# Patient Record
Sex: Female | Born: 1965 | Race: White | Hispanic: No | Marital: Married | Smoking: Former smoker
Health system: Southern US, Community
[De-identification: ages and names within clinical notes are randomized; demographics above are authoritative.]

## PROBLEM LIST (undated history)

## (undated) DIAGNOSIS — R569 Unspecified convulsions: Secondary | ICD-10-CM

## (undated) DIAGNOSIS — F329 Major depressive disorder, single episode, unspecified: Secondary | ICD-10-CM

## (undated) DIAGNOSIS — K219 Gastro-esophageal reflux disease without esophagitis: Secondary | ICD-10-CM

## (undated) DIAGNOSIS — M797 Fibromyalgia: Secondary | ICD-10-CM

## (undated) DIAGNOSIS — F419 Anxiety disorder, unspecified: Secondary | ICD-10-CM

## (undated) DIAGNOSIS — E079 Disorder of thyroid, unspecified: Secondary | ICD-10-CM

## (undated) DIAGNOSIS — M1990 Unspecified osteoarthritis, unspecified site: Secondary | ICD-10-CM

## (undated) DIAGNOSIS — D649 Anemia, unspecified: Secondary | ICD-10-CM

## (undated) DIAGNOSIS — E785 Hyperlipidemia, unspecified: Secondary | ICD-10-CM

## (undated) DIAGNOSIS — K589 Irritable bowel syndrome without diarrhea: Secondary | ICD-10-CM

## (undated) DIAGNOSIS — R5382 Chronic fatigue, unspecified: Secondary | ICD-10-CM

## (undated) DIAGNOSIS — T7840XA Allergy, unspecified, initial encounter: Secondary | ICD-10-CM

## (undated) DIAGNOSIS — G43909 Migraine, unspecified, not intractable, without status migrainosus: Secondary | ICD-10-CM

## (undated) DIAGNOSIS — F32A Depression, unspecified: Secondary | ICD-10-CM

## (undated) HISTORY — DX: Major depressive disorder, single episode, unspecified: F32.9

## (undated) HISTORY — DX: Disorder of thyroid, unspecified: E07.9

## (undated) HISTORY — DX: Allergy, unspecified, initial encounter: T78.40XA

## (undated) HISTORY — DX: Irritable bowel syndrome, unspecified: K58.9

## (undated) HISTORY — DX: Anemia, unspecified: D64.9

## (undated) HISTORY — DX: Fibromyalgia: M79.7

## (undated) HISTORY — DX: Depression, unspecified: F32.A

## (undated) HISTORY — DX: Migraine, unspecified, not intractable, without status migrainosus: G43.909

## (undated) HISTORY — DX: Anxiety disorder, unspecified: F41.9

## (undated) HISTORY — PX: FOOT SURGERY: SHX648

## (undated) HISTORY — DX: Hyperlipidemia, unspecified: E78.5

## (undated) HISTORY — DX: Unspecified convulsions: R56.9

## (undated) HISTORY — DX: Gastro-esophageal reflux disease without esophagitis: K21.9

## (undated) HISTORY — PX: COSMETIC SURGERY: SHX468

## (undated) HISTORY — DX: Chronic fatigue, unspecified: R53.82

## (undated) HISTORY — DX: Unspecified osteoarthritis, unspecified site: M19.90

---

## 2004-08-21 ENCOUNTER — Ambulatory Visit: Payer: Self-pay | Admitting: Family Medicine

## 2004-08-31 ENCOUNTER — Ambulatory Visit: Payer: Self-pay | Admitting: *Deleted

## 2004-11-20 ENCOUNTER — Ambulatory Visit: Payer: Self-pay | Admitting: Family Medicine

## 2005-03-12 ENCOUNTER — Ambulatory Visit: Payer: Self-pay | Admitting: Family Medicine

## 2005-03-26 ENCOUNTER — Ambulatory Visit (HOSPITAL_COMMUNITY): Admission: RE | Admit: 2005-03-26 | Discharge: 2005-03-26 | Payer: Self-pay | Admitting: Family Medicine

## 2005-04-07 ENCOUNTER — Ambulatory Visit: Payer: Self-pay | Admitting: Family Medicine

## 2005-09-20 HISTORY — PX: BREAST SURGERY: SHX581

## 2006-06-06 DIAGNOSIS — Z86000 Personal history of in-situ neoplasm of breast: Secondary | ICD-10-CM | POA: Insufficient documentation

## 2008-03-29 ENCOUNTER — Encounter: Admission: RE | Admit: 2008-03-29 | Discharge: 2008-03-29 | Payer: Self-pay | Admitting: Internal Medicine

## 2008-10-10 ENCOUNTER — Ambulatory Visit (HOSPITAL_COMMUNITY): Admission: RE | Admit: 2008-10-10 | Discharge: 2008-10-10 | Payer: Self-pay | Admitting: Family Medicine

## 2009-04-04 ENCOUNTER — Ambulatory Visit (HOSPITAL_COMMUNITY): Admission: RE | Admit: 2009-04-04 | Discharge: 2009-04-04 | Payer: Self-pay | Admitting: Obstetrics and Gynecology

## 2009-04-04 ENCOUNTER — Encounter (INDEPENDENT_AMBULATORY_CARE_PROVIDER_SITE_OTHER): Payer: Self-pay | Admitting: Obstetrics and Gynecology

## 2009-11-04 ENCOUNTER — Encounter: Admission: RE | Admit: 2009-11-04 | Discharge: 2010-02-02 | Payer: Self-pay | Admitting: Occupational Medicine

## 2009-12-13 IMAGING — CT CT ANGIO NECK
1 of 8 series · 3 of 33 positions shown · IV contrast (APPLIED)
Comparison: None.

CLINICAL DATA: Left-sided neck pain and dizziness.  No known
trauma.  Seeing floaters at work this week.

CT ANGIOGRAPHY NECK
TECHNIQUE: Multidetector CT imaging of the neck was performed
using the standard protocol prior to and during bolus
administration of intravenous contrast. Multiplanar CT image
reconstructions including MIPs  were obtained to evaluate the
vascular anatomy.
Contrast: 100 ml Omnipaque 350.

[Series 6: neck without · axial · non-contrast · 0.34mm/px · z∈[-350,-116]mm · 3 of 79 slices shown]
[im 1/79  soft-tissue]
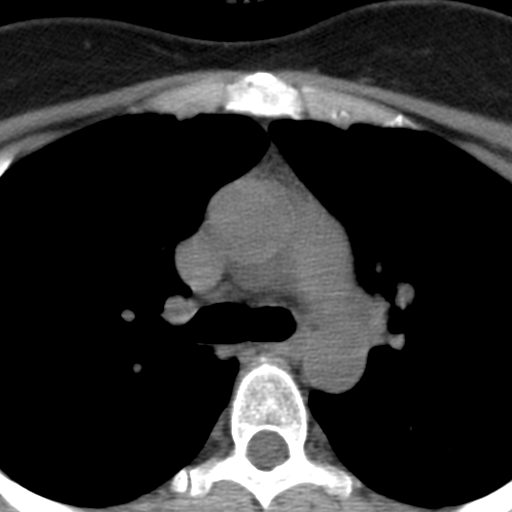
[im 40/79  bone]
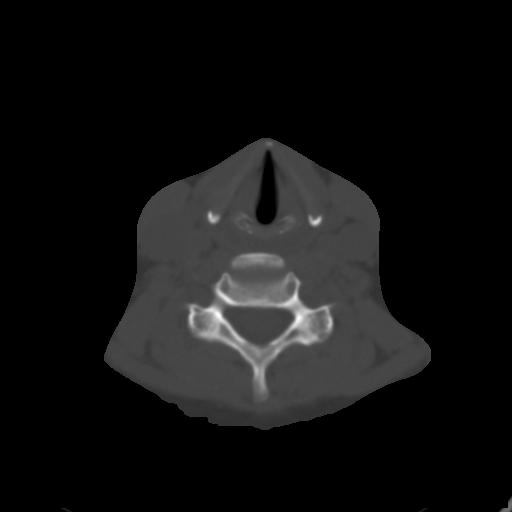
[im 79/79  soft-tissue]
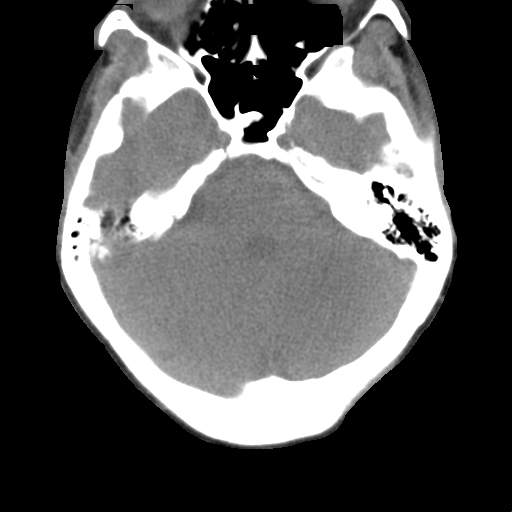

[3 of 33 positions shown; findings below may reference images not displayed]

FINDINGS: No evidence of vertebral artery or carotid neck
dissection or significant stenosis Mild degenerative changes
cervical spine.  Limited imaging of intracranial structures
unremarkable.  No neck mass identified.  Scattered normal sized
lymph nodes which appears slightly elongated.  The lung apices are
clear.  Polypoid opacification right maxillary sinus.
IMPRESSION: No evidence of vertebral or carotid artery neck dissection or high-
grade stenosis. Intracranial vessels were not evaluated on the
present exam.

Mild cervical spine degenerative changes.

Polypoid opacification right maxillary sinus.

## 2010-10-11 ENCOUNTER — Encounter: Payer: Self-pay | Admitting: Internal Medicine

## 2010-10-11 ENCOUNTER — Encounter: Payer: Self-pay | Admitting: Family Medicine

## 2010-11-02 ENCOUNTER — Ambulatory Visit: Payer: 59 | Attending: Specialist | Admitting: Physical Therapy

## 2010-11-02 DIAGNOSIS — R293 Abnormal posture: Secondary | ICD-10-CM | POA: Insufficient documentation

## 2010-11-02 DIAGNOSIS — R5381 Other malaise: Secondary | ICD-10-CM | POA: Insufficient documentation

## 2010-11-02 DIAGNOSIS — M542 Cervicalgia: Secondary | ICD-10-CM | POA: Insufficient documentation

## 2010-11-02 DIAGNOSIS — IMO0001 Reserved for inherently not codable concepts without codable children: Secondary | ICD-10-CM | POA: Insufficient documentation

## 2010-11-03 ENCOUNTER — Ambulatory Visit: Payer: 59 | Admitting: Physical Therapy

## 2010-11-10 ENCOUNTER — Ambulatory Visit: Payer: 59 | Admitting: Physical Therapy

## 2010-11-11 ENCOUNTER — Encounter: Payer: 59 | Admitting: Physical Therapy

## 2010-11-12 ENCOUNTER — Ambulatory Visit: Payer: 59 | Admitting: Physical Therapy

## 2010-11-16 ENCOUNTER — Ambulatory Visit: Payer: 59 | Admitting: Physical Therapy

## 2010-11-27 ENCOUNTER — Encounter: Payer: 59 | Admitting: Oncology

## 2010-11-27 ENCOUNTER — Encounter (HOSPITAL_BASED_OUTPATIENT_CLINIC_OR_DEPARTMENT_OTHER): Payer: 59 | Admitting: Oncology

## 2010-11-27 DIAGNOSIS — C50919 Malignant neoplasm of unspecified site of unspecified female breast: Secondary | ICD-10-CM

## 2010-12-27 LAB — CBC
MCHC: 35.3 g/dL (ref 30.0–36.0)
MCV: 86.7 fL (ref 78.0–100.0)
Platelets: 158 10*3/uL (ref 150–400)
RBC: 4.32 MIL/uL (ref 3.87–5.11)
WBC: 3.7 10*3/uL — ABNORMAL LOW (ref 4.0–10.5)

## 2010-12-27 LAB — COMPREHENSIVE METABOLIC PANEL
CO2: 30 mEq/L (ref 19–32)
Calcium: 9.3 mg/dL (ref 8.4–10.5)
Creatinine, Ser: 0.76 mg/dL (ref 0.4–1.2)
GFR calc Af Amer: >60 mL/min (ref >60)
GFR calc non Af Amer: >60 mL/min (ref >60)
Glucose, Bld: 90 mg/dL (ref 70–99)
Total Protein: 7 g/dL (ref 6.0–8.3)

## 2011-02-02 NOTE — Op Note (Signed)
NAMEVEE, BAHE                  ACCOUNT NO.:  [id number]   MEDICAL RECORD NO.:  [id number]          PATIENT TYPE:  AMB   LOCATION:  SDC                           FACILITY:  [facility]   PHYSICIAN:  Duke Salvia. Marcelle Overlie, M.D.DATE OF BIRTH:  July 27, 1966   DATE OF PROCEDURE:  04/04/2009  DATE OF DISCHARGE:                               OPERATIVE REPORT   PREOPERATIVE DIAGNOSES:  Endometrial polyp, abnormal uterine bleeding.   POSTOPERATIVE DIAGNOSES:  Endometrial polyp, abnormal uterine bleeding.   PROCEDURES:  Dilatation and curettage, hysteroscopy.   SURGEON:  Duke Salvia. Marcelle Overlie, MD   ANESTHESIA:  General plus paracervical block.   SPECIMENS REMOVED:  Endometrial curettings.   PROCEDURE AND FINDINGS:  The patient was taken to the operating room.  After an adequate level of general anesthesia was obtained with the  patient's legs in stirrups, the perineum and vagina were prepped and  draped in usual manner for D and C.  The bladder was drained, EUA  carried out.  Uterus, mid position, normal size.  Adnexa negative.  A  speculum was positioned.  Cervix was grasped with tenaculum.  Paracervical block was created by infiltrating at 3 and 9 o'clock  submucosally, 5-7 mL of 1% Xylocaine on either side after negative  aspiration.  Uterus was found to 7 cm, progressively dilated to 27 Pratt  dilator.  The 4-mm continuous flow hysteroscope was then inserted.  Both  tubal ostia could be easily seen.  There was a posterior wall polyp that  was noted by sonohysterogram previously.  Scope was removed.  Sharp  curettage was carried out.  Exploration with a polyp forceps revealed no  further tissue.  The scope was reinserted.  The cavity irrigated.  The  polyp was noted to be removed and the remaining walls of the uterus were  unremarkable.  Tissue was sent to Pathology.  She tolerated this well,  went to recovery room in good condition.      Richard M. Marcelle Overlie, M.D.  Electronically  Signed     RM[facility]/MEDQ  D:  04/04/2009  T:  04/04/2009  Job:  [id number]

## 2011-02-02 NOTE — H&P (Signed)
NAMEBLAIZE, NIPPER                  ACCOUNT NO.:  [id number]   MEDICAL RECORD NO.:  [id number]          PATIENT TYPE:  AMB   LOCATION:  SDC                           FACILITY:  [facility]   PHYSICIAN:  Duke Salvia. Marcelle Overlie, M.D.DATE OF BIRTH:  Sep 18, 1966   DATE OF ADMISSION:  DATE OF DISCHARGE:                              HISTORY & PHYSICAL   CHIEF COMPLAINT:  Abnormal bleeding.   HISTORY OF PRESENT ILLNESS:  A 45 year old G1 P0 not currently using  anything for contraception was referred to me from Dr. Samuel Jester  complaining of heavy irregular bleeding.  We performed SHG in addition  to checking her FSH.  FSH returned 25.5, sonohysterogram showed small  cyst on the right ovary, no free fluid, polypoid mass noted within the  cavity that was well defined.  She presents now for D and C,  hysteroscopy.  This procedure including risks related to bleeding,  infection, other complications that may require additional surgery all  reviewed with her, which she understands and accepts.   ALLERGIES:  LATEX, CODEINE, SULFA, DOXYCYCLINE.   CURRENT MEDICATIONS:  Klonopin, imipramine, Prilosec, Claritin, Nasonex,  multivitamins.   SURGICAL HISTORY:  She had a perforated eardrum in the past along with  rhinoplasty, right breast lumpectomy, and prior D and C in 1995.   REVIEW OF SYSTEMS:  Significant for history of headache, anemia, UTI,  carcinoma in situ of the breast.   FAMILY HISTORY:  Significant for headache, asthma, hiatal hernia,  thyroid disease, TB, diabetes, hypertension.   SOCIAL HISTORY:  Denies drug or alcohol use.  She does smoke 1-1/2 PPD.  Her medical doctor is Dr. Samuel Jester.  She is married.   PHYSICAL EXAMINATION:  VITAL SIGNS:  Temperature 98.2, blood pressure  120/78.  HEENT: Unremarkable.  NECK:  Supple without masses.  LUNGS:  Clear.  CARDIOVASCULAR:  Regular rate and rhythm without murmurs, rubs, or  gallops.  BREASTS:  Without masses.  ABDOMEN:  Soft, flat,  nontender.  PELVIC:  Normal external genitalia.  Vaginal swab is clear.  Uterus mid  positional size.  Adnexa negative.   IMPRESSION:  1. Abnormal uterine bleeding.  2. Endometrial polyp.   PLAN:  D and C, hysteroscopy.  Procedure and risks reviewed as above.      Richard M. Marcelle Overlie, M.D.  Electronically Signed     RM[facility]/MEDQ  D:  04/01/2009  T:  04/02/2009  Job:  [id number]

## 2011-03-30 ENCOUNTER — Ambulatory Visit: Payer: 59 | Attending: *Deleted | Admitting: Physical Therapy

## 2011-03-30 DIAGNOSIS — M542 Cervicalgia: Secondary | ICD-10-CM | POA: Insufficient documentation

## 2011-03-30 DIAGNOSIS — M2560 Stiffness of unspecified joint, not elsewhere classified: Secondary | ICD-10-CM | POA: Insufficient documentation

## 2011-03-30 DIAGNOSIS — R5381 Other malaise: Secondary | ICD-10-CM | POA: Insufficient documentation

## 2011-04-01 ENCOUNTER — Ambulatory Visit: Payer: 59 | Admitting: Physical Therapy

## 2011-04-08 ENCOUNTER — Ambulatory Visit: Payer: 59 | Admitting: Physical Therapy

## 2011-04-09 ENCOUNTER — Encounter: Payer: 59 | Admitting: Physical Therapy

## 2011-04-14 ENCOUNTER — Ambulatory Visit: Payer: 59 | Admitting: Physical Therapy

## 2011-04-19 ENCOUNTER — Encounter: Payer: 59 | Admitting: Physical Therapy

## 2011-04-20 ENCOUNTER — Ambulatory Visit: Payer: 59 | Attending: *Deleted | Admitting: *Deleted

## 2011-04-20 DIAGNOSIS — M2560 Stiffness of unspecified joint, not elsewhere classified: Secondary | ICD-10-CM | POA: Insufficient documentation

## 2011-04-20 DIAGNOSIS — R5381 Other malaise: Secondary | ICD-10-CM | POA: Insufficient documentation

## 2011-04-20 DIAGNOSIS — M542 Cervicalgia: Secondary | ICD-10-CM | POA: Insufficient documentation

## 2011-04-22 ENCOUNTER — Ambulatory Visit: Payer: 59 | Attending: *Deleted | Admitting: Physical Therapy

## 2011-04-22 DIAGNOSIS — R5381 Other malaise: Secondary | ICD-10-CM | POA: Insufficient documentation

## 2011-04-22 DIAGNOSIS — M542 Cervicalgia: Secondary | ICD-10-CM | POA: Insufficient documentation

## 2011-04-22 DIAGNOSIS — M2560 Stiffness of unspecified joint, not elsewhere classified: Secondary | ICD-10-CM | POA: Insufficient documentation

## 2011-04-28 ENCOUNTER — Ambulatory Visit: Payer: 59 | Admitting: Physical Therapy

## 2011-05-03 ENCOUNTER — Ambulatory Visit: Payer: 59 | Admitting: Physical Therapy

## 2011-07-06 ENCOUNTER — Other Ambulatory Visit: Payer: Self-pay | Admitting: Obstetrics and Gynecology

## 2013-03-15 ENCOUNTER — Ambulatory Visit: Payer: BC Managed Care – PPO | Attending: Orthopedic Surgery | Admitting: Physical Therapy

## 2013-03-15 DIAGNOSIS — IMO0001 Reserved for inherently not codable concepts without codable children: Secondary | ICD-10-CM | POA: Insufficient documentation

## 2013-03-15 DIAGNOSIS — M24519 Contracture, unspecified shoulder: Secondary | ICD-10-CM | POA: Insufficient documentation

## 2013-03-15 DIAGNOSIS — M25519 Pain in unspecified shoulder: Secondary | ICD-10-CM | POA: Insufficient documentation

## 2013-03-20 ENCOUNTER — Ambulatory Visit: Payer: BC Managed Care – PPO | Attending: Orthopedic Surgery | Admitting: Physical Therapy

## 2013-03-20 DIAGNOSIS — M24519 Contracture, unspecified shoulder: Secondary | ICD-10-CM | POA: Insufficient documentation

## 2013-03-20 DIAGNOSIS — IMO0001 Reserved for inherently not codable concepts without codable children: Secondary | ICD-10-CM | POA: Insufficient documentation

## 2013-03-20 DIAGNOSIS — M25519 Pain in unspecified shoulder: Secondary | ICD-10-CM | POA: Insufficient documentation

## 2013-03-22 ENCOUNTER — Ambulatory Visit: Payer: BC Managed Care – PPO | Admitting: Physical Therapy

## 2013-03-26 ENCOUNTER — Ambulatory Visit: Payer: BC Managed Care – PPO | Admitting: Physical Therapy

## 2013-03-28 ENCOUNTER — Ambulatory Visit: Payer: BC Managed Care – PPO

## 2013-04-02 ENCOUNTER — Ambulatory Visit: Payer: BC Managed Care – PPO | Admitting: Physical Therapy

## 2013-04-05 ENCOUNTER — Ambulatory Visit: Payer: BC Managed Care – PPO | Admitting: Physical Therapy

## 2013-04-09 ENCOUNTER — Ambulatory Visit: Payer: BC Managed Care – PPO | Admitting: *Deleted

## 2013-04-12 ENCOUNTER — Ambulatory Visit: Payer: BC Managed Care – PPO | Admitting: Physical Therapy

## 2013-04-16 ENCOUNTER — Ambulatory Visit: Payer: BC Managed Care – PPO | Admitting: *Deleted

## 2013-04-18 ENCOUNTER — Encounter: Payer: 59 | Admitting: *Deleted

## 2013-04-23 ENCOUNTER — Ambulatory Visit: Payer: BC Managed Care – PPO | Attending: Orthopedic Surgery | Admitting: Physical Therapy

## 2013-04-23 DIAGNOSIS — IMO0001 Reserved for inherently not codable concepts without codable children: Secondary | ICD-10-CM | POA: Insufficient documentation

## 2013-04-23 DIAGNOSIS — M25519 Pain in unspecified shoulder: Secondary | ICD-10-CM | POA: Insufficient documentation

## 2013-04-23 DIAGNOSIS — M24519 Contracture, unspecified shoulder: Secondary | ICD-10-CM | POA: Insufficient documentation

## 2013-05-03 ENCOUNTER — Ambulatory Visit: Payer: 59 | Admitting: Sports Medicine

## 2013-05-07 ENCOUNTER — Ambulatory Visit: Payer: BC Managed Care – PPO | Admitting: Physical Therapy

## 2013-05-24 ENCOUNTER — Ambulatory Visit (INDEPENDENT_AMBULATORY_CARE_PROVIDER_SITE_OTHER): Payer: BC Managed Care – PPO | Admitting: Sports Medicine

## 2013-05-24 VITALS — BP 100/64 | Ht 64.0 in | Wt 130.0 lb

## 2013-05-24 DIAGNOSIS — G5760 Lesion of plantar nerve, unspecified lower limb: Secondary | ICD-10-CM | POA: Insufficient documentation

## 2013-05-24 DIAGNOSIS — M797 Fibromyalgia: Secondary | ICD-10-CM | POA: Insufficient documentation

## 2013-05-24 DIAGNOSIS — M25569 Pain in unspecified knee: Secondary | ICD-10-CM

## 2013-05-24 DIAGNOSIS — M25561 Pain in right knee: Secondary | ICD-10-CM

## 2013-05-24 DIAGNOSIS — G43901 Migraine, unspecified, not intractable, with status migrainosus: Secondary | ICD-10-CM

## 2013-05-24 DIAGNOSIS — G5761 Lesion of plantar nerve, right lower limb: Secondary | ICD-10-CM

## 2013-05-24 DIAGNOSIS — IMO0001 Reserved for inherently not codable concepts without codable children: Secondary | ICD-10-CM

## 2013-05-24 MED ORDER — AMITRIPTYLINE HCL 25 MG PO TABS: 25.0000 mg | ORAL_TABLET | Freq: Every day | ORAL | Status: DC

## 2013-05-24 NOTE — Progress Notes (Signed)
  Subjective:    Patient ID: Ruth Hopkins, female    DOB: [birth year], 47 y.o.   MRN: [id number]  HPI  47 year old F with a PMH of fibroyalgia, migraine headaches, and sciatica who presents today for evaluation of bilateral foot pain. The pain started in April of 2014 when she started vigorous walking on hills outside after previously exercising inside during the winter. The pain started on the "balls of her feet" and felt like burning or stabbing.She also notes intermittent numbness and is concerned that is related to sciatica. Currently, the pain is worse on the right than the left and radiates to the back of both side of her ankles and right lateral lower leg and right knee.   For this problem, she reports that she has seen several physicians including a podiatrist, Dr. Charlsie Merles, at [facility] who made orthotic insoles for the patient, which she claims helped minimally. She has also seen Dr. Ulice Brilliant, podiatrist, in [place] for the issue who diagnosed her with with a neuroma and performed four steroid injections of her feet. This did not help. Additionally, she has seen Dr. Victorino Dike, orthopedic surgeon, who noted that she had hammer toes and prescribed metatarsal pads. The patient stopped using this, because, these increased her pain.   The patient does not bring any records to clinic today.   Review of Systems Frequent night time awakenings     Objective:   Physical Exam BP 100/64  Ht 5\' 4"  (1.626 m)  Wt 130 lb (58.968 kg)  BMI 22.3 kg/m2  Gen: middle age WF, pleasant, non distressed, smells strongly of cigarette smoke Feet:    - Morton's foot bilat, prominent 2nd,3rd toes, mild hammer toe of 5th toes bilaterally, hallux limitus on right   - maitained mid foot arch with collapse of anterior arch on right more than left  - Left sole with callous  - Standing with neutral position  Right Ankle: normal appearing, no edema, tenderness of posterior lateral and medial malleoli, full flexion,  extension, inversion and eversion of foot  Right Knee Exam: normal appearing, full extension and flexion of knee, negative Mcmurray's and Lachman's tests, normal strength of tensor fascia lata, decreased strength of vastus medialis and gluteus maximus  Back: negative straight leg raise test, 5/5 strength of knee extension and flexion, dorsi and plantar flexion        Assessment & Plan:  47 year old F with pain in her foot due to transverse arch collapse, knee pain due to weakness of Gluteus maximus and vastus medialis, and complicated by poorly controlled fibromyalgia pain.

## 2013-05-24 NOTE — Assessment & Plan Note (Addendum)
Assessment: collapse of transverse arch contributing to neuralgia pain Plan: fitted for extra-small metatarsal pads, patient tolerated them well in clinic, will slowly increase walking as tolerated  I think Dr Victorino Dike started correct TX but pad may have been too large for her to tolerate as she is sensitive about changes  foot shows thin fat pad Morton type with dominant 2nd and 3rd MT

## 2013-05-24 NOTE — Assessment & Plan Note (Addendum)
Assessment: Patient needs improved control Plan: stop imipramine and replace with amitriptyline 25 mg QHS for improved sleep and pain control

## 2013-05-24 NOTE — Assessment & Plan Note (Signed)
Assessment: weakness of multiple muscles including gluteus maximus and vastus medialis Plan: given exercise regimen for muscle strengthening

## 2013-05-24 NOTE — Patient Instructions (Addendum)
Dr. Wallace Cullens,   It was nice to meet you today. Please read below about the following issues:   1. Foot Pain - This is due to collapse of your front arch. That is why we are using the metatarsal pads to take relief off of nerves. Continue to use these and gradually increase the amount that you walk. Please do the following exercises:  A. Toe Curls - grab a towel with your toes and pull it towards you B. One Foot Balance - Stand on one foot for 5-10 seconds and then alternate to the other side, repeat 5 times  2. Right Knee Pain - This could be due to weakness of one of you quad muscles. Please do the following exercises:  A. Butt massage with tennis ball on the right side B. Hip Abduction (see handout) - 10 times, 3 sets C. Standing Rotation (see handout)  - 10 times, 3 sets   3. Fibromyalgia Pain - I think you should start taking the Amitriptyline 25 mg each night.   Follow up in 4-6 weeks,   Dr. Clinton Sawyer

## 2013-06-28 ENCOUNTER — Encounter: Payer: Self-pay | Admitting: Sports Medicine

## 2013-06-28 ENCOUNTER — Ambulatory Visit (INDEPENDENT_AMBULATORY_CARE_PROVIDER_SITE_OTHER): Payer: BC Managed Care – PPO | Admitting: Sports Medicine

## 2013-06-28 VITALS — BP 100/60 | Ht 64.0 in | Wt 127.0 lb

## 2013-06-28 DIAGNOSIS — G5761 Lesion of plantar nerve, right lower limb: Secondary | ICD-10-CM

## 2013-06-28 DIAGNOSIS — G5760 Lesion of plantar nerve, unspecified lower limb: Secondary | ICD-10-CM

## 2013-06-28 DIAGNOSIS — IMO0001 Reserved for inherently not codable concepts without codable children: Secondary | ICD-10-CM

## 2013-06-28 DIAGNOSIS — M797 Fibromyalgia: Secondary | ICD-10-CM

## 2013-06-28 NOTE — Assessment & Plan Note (Signed)
Patient has a very narrow foot and a very limited fat pad of the foot  She has very well may orthotics  We added an Ex. small metatarsal pad  We added some foam cushioning under the metatarsal arch  I wanted her to try this for at least a couple weeks to see if it gives her some relief

## 2013-06-28 NOTE — Assessment & Plan Note (Signed)
I think many of her muscle and joint aches symptoms relate to a fibromyalgia type pain complex. I would encourage her to work with one physician to manage the medications. We did not prescribe any today.  I do think she would benefit from some low level but fairly consistent exercise activities as this does seem to lessen pain in fibromyalgia patients.  She was given a suggestion of a number of these today.  She should continue walking but not push it to the point of significant pain  When necessary recheck

## 2013-06-28 NOTE — Progress Notes (Signed)
  Subjective:    Patient ID: Ruth Hopkins, female    DOB: [birth year], 47 y.o.   MRN: [id number]  HPI  Pt presents to clinic for f/u of bilateral foot pain that is not improved significantly. Still having pain on balls of feet R > L.  Did not start amitriptyline as she was concerned about weight gain.   Doing suggested hip exercises as she is able. This has helped strengthen her hips. In spite of her fibromyalgia pain she continues to push exercise fairly often because she worries about gaining weight.  She currently sees a number of other physicians for her migraine problems and fibromyalgia problems.   Review of Systems     Objective:   Physical Exam  NAD  Morton's foot bilat  Straight leg raise neg bilat Leg lengths equal  Alignment good FABER right L > R Moderate weakness rt hip abductor Lt hip abductor strong  Quads and hip flexors strong bilat  TTP over rt piriformis, not left Perispinous muscle at level of scapula ttp  No spasm in either trap, Q lumborum, latissimus dorsi   Prominent coccyx          Assessment & Plan:

## 2013-06-28 NOTE — Patient Instructions (Addendum)
Continue suggested hip exercises- side leg lifts and hip rotations  Do 5-10 repititions with light weight dumbbell on exercises for your arms and shoulders Curls Flies Shoulder presses   Shake outs to help loosen neck muscles   Follow up in 2 months if needed  Thank you for seeing [place] today!

## 2013-07-02 ENCOUNTER — Ambulatory Visit: Payer: BC Managed Care – PPO | Attending: Orthopedic Surgery | Admitting: Physical Therapy

## 2013-07-02 DIAGNOSIS — IMO0001 Reserved for inherently not codable concepts without codable children: Secondary | ICD-10-CM | POA: Insufficient documentation

## 2013-07-02 DIAGNOSIS — R5381 Other malaise: Secondary | ICD-10-CM | POA: Insufficient documentation

## 2013-07-02 DIAGNOSIS — M25579 Pain in unspecified ankle and joints of unspecified foot: Secondary | ICD-10-CM | POA: Insufficient documentation

## 2013-07-04 ENCOUNTER — Ambulatory Visit: Payer: BC Managed Care – PPO | Admitting: Physical Therapy

## 2013-07-09 DIAGNOSIS — F17200 Nicotine dependence, unspecified, uncomplicated: Secondary | ICD-10-CM | POA: Insufficient documentation

## 2013-07-11 ENCOUNTER — Ambulatory Visit: Payer: BC Managed Care – PPO

## 2013-07-17 ENCOUNTER — Ambulatory Visit: Payer: BC Managed Care – PPO

## 2013-07-18 ENCOUNTER — Ambulatory Visit (INDEPENDENT_AMBULATORY_CARE_PROVIDER_SITE_OTHER): Payer: BC Managed Care – PPO

## 2013-07-18 ENCOUNTER — Ambulatory Visit (INDEPENDENT_AMBULATORY_CARE_PROVIDER_SITE_OTHER): Payer: BC Managed Care – PPO | Admitting: Podiatry

## 2013-07-18 DIAGNOSIS — Z9889 Other specified postprocedural states: Secondary | ICD-10-CM

## 2013-07-18 DIAGNOSIS — M779 Enthesopathy, unspecified: Secondary | ICD-10-CM

## 2013-07-18 DIAGNOSIS — M7750 Other enthesopathy of unspecified foot: Secondary | ICD-10-CM

## 2013-07-18 NOTE — Progress Notes (Signed)
Pt present for follow-up of R plantar forefoot pain.

## 2013-07-19 NOTE — Progress Notes (Signed)
Subjective:     Patient ID: Ruth Hopkins, female   DOB: 05/22/1966, 47 y.o.   MRN: [id number]  Foot Pain   patient states I am still having pain in my right foot but it is improved since we started and I like my orthotics   Review of Systems  All other systems reviewed and are negative.       Objective:   Physical Exam  Nursing note and vitals reviewed. Constitutional: She is oriented to person, place, and time.  Cardiovascular: Intact distal pulses.   Musculoskeletal: Normal range of motion.  Neurological: She is oriented to person, place, and time.   patient is found to have continued nondescript discomfort in the right forefoot with no area of acute. It appears to be the dorsum of the metatarsal shafts within the plantar fascia and moderate discomfort of the second and third metatarsophalangeal joint right over left    Assessment:     Nondescript pain which may be fibromyalgia or other systemic condition versus localized. I biggest concern is the second and third metatarsophalangeal joints    Plan:     X-rays reviewed trying to see if change has occurred. I discussed that the only thing that may be of some benefit to her beyond her orthotics would be shortening osteotomies of the second and third metatarsals right foot. I want her to spend the next 6 weeks trying to better understand where her pain seems to originate from and we can make a decision if there's anything else we can do to try to help her. I did explain that there is no guarantees that we will be able to improve over where she stands at this time

## 2013-07-21 ENCOUNTER — Other Ambulatory Visit: Payer: Self-pay | Admitting: *Deleted

## 2013-07-21 DIAGNOSIS — M779 Enthesopathy, unspecified: Secondary | ICD-10-CM

## 2013-07-24 ENCOUNTER — Ambulatory Visit: Payer: BC Managed Care – PPO | Attending: Orthopedic Surgery

## 2013-07-24 DIAGNOSIS — M25579 Pain in unspecified ankle and joints of unspecified foot: Secondary | ICD-10-CM | POA: Insufficient documentation

## 2013-07-24 DIAGNOSIS — R5381 Other malaise: Secondary | ICD-10-CM | POA: Insufficient documentation

## 2013-07-24 DIAGNOSIS — IMO0001 Reserved for inherently not codable concepts without codable children: Secondary | ICD-10-CM | POA: Insufficient documentation

## 2013-08-28 ENCOUNTER — Ambulatory Visit: Payer: BC Managed Care – PPO | Admitting: Sports Medicine

## 2013-08-29 ENCOUNTER — Ambulatory Visit (INDEPENDENT_AMBULATORY_CARE_PROVIDER_SITE_OTHER): Payer: BC Managed Care – PPO | Admitting: Podiatry

## 2013-08-29 ENCOUNTER — Encounter: Payer: Self-pay | Admitting: Podiatry

## 2013-08-29 VITALS — BP 108/76 | HR 79 | Resp 12

## 2013-08-29 DIAGNOSIS — M216X9 Other acquired deformities of unspecified foot: Secondary | ICD-10-CM

## 2013-08-29 DIAGNOSIS — M2040 Other hammer toe(s) (acquired), unspecified foot: Secondary | ICD-10-CM

## 2013-08-29 NOTE — Progress Notes (Signed)
Subjective:     Patient ID: Ruth Hopkins, female   DOB: Nov 15, 1965, 47 y.o.   MRN: [id number]  HPI patient presents stating these joints are still very sore and the length of my toe seems to cause the problems to when I try to wear shoe gear. The orthotics give minimal relief but when she tries to be active the pain reoccurs right over left   Review of Systems     Objective:   Physical Exam Neurovascular status intact with no health history changes noted. 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 We discussed shortening osteotomies along with digital stabilization and the possibility for significant improvement or not improving with this procedure. Patient would prefer to have the procedures done understanding risks and at this time I allowed her to sign consent form after extensive review of complications including not improving and the fact that total recovery. We'll take 6 months to one year. Patient wants surgery signs consent form and is given air fracture walker for preoperative usage and after surgery scheduled in the next 2 weeks and is encouraged to call with any questions

## 2013-08-29 NOTE — Patient Instructions (Signed)
Pre-Operative Instructions  Congratulations, you have decided to take an important step to improving your quality of life.  You can be assured that the doctors of [facility] will be with you every step of the way.  1. Plan to be at the surgery center/hospital at least 1 (one) hour prior to your scheduled time unless otherwise directed by the surgical center/hospital staff.  You must have a responsible adult accompany you, remain during the surgery and drive you home.  Make sure you have directions to the surgical center/hospital and know how to get there on time. 2. For hospital based surgery you will need to obtain a history and physical form from your family physician within 1 month prior to the date of surgery- we will give you a form for you primary physician.  3. We make every effort to accommodate the date you request for surgery.  There are however, times where surgery dates or times have to be moved.  We will contact you as soon as possible if a change in schedule is required.   4. No Aspirin/Ibuprofen for one week before surgery.  If you are on aspirin, any non-steroidal anti-inflammatory medications (Mobic, Aleve, Ibuprofen) you should stop taking it 7 days prior to your surgery.  You make take Tylenol  For pain prior to surgery.  5. Medications- If you are taking daily heart and blood pressure medications, seizure, reflux, allergy, asthma, anxiety, pain or diabetes medications, make sure the surgery center/hospital is aware before the day of surgery so they may notify you which medications to take or avoid the day of surgery. 6. No food or drink after midnight the night before surgery unless directed otherwise by surgical center/hospital staff. 7. No alcoholic beverages 24 hours prior to surgery.  No smoking 24 hours prior to or 24 hours after surgery. 8. Wear loose pants or shorts- loose enough to fit over bandages, boots, and casts. 9. No slip on shoes, sneakers are best. 10. Bring  your boot with you to the surgery center/hospital.  Also bring crutches or a walker if your physician has prescribed it for you.  If you do not have this equipment, it will be provided for you after surgery. 11. If you have not been contracted by the surgery center/hospital by the day before your surgery, call to confirm the date and time of your surgery. 12. Leave-time from work may vary depending on the type of surgery you have.  Appropriate arrangements should be made prior to surgery with your employer. 13. Prescriptions will be provided immediately following surgery by your doctor.  Have these filled as soon as possible after surgery and take the medication as directed. 14. Remove nail polish on the operative foot. 15. Wash the night before surgery.  The night before surgery wash the foot and leg well with the antibacterial soap provided and water paying special attention to beneath the toenails and in between the toes.  Rinse thoroughly with water and dry well with a towel.  Perform this wash unless told not to do so by your physician.  Enclosed: 1 Ice pack (please put in freezer the night before surgery)   1 Hibiclens skin cleaner   Pre-op Instructions  If you have any questions regarding the instructions, do not hesitate to call our office.  [place]: [address] [place], [place] [address] [phone]  [place]: [address] [place], [place] [address] [phone]  [place]: [address]  [place], [place] [address] [phone]  Dr. Richard   Tuchman DPM, Dr. Norman Regal DPM Dr. Richard Sikora DPM, Dr. M. Todd Hyatt DPM, Dr. Kathryn Egerton DPM 

## 2013-08-30 ENCOUNTER — Telehealth: Payer: Self-pay | Admitting: *Deleted

## 2013-08-30 NOTE — Telephone Encounter (Signed)
Pt asked how long before she could work.  I informed pt that Dr Charlsie Merles would not release to work until after her first surgery check 6 - 10 days post-op.  If she has a sit down job, she could return with his release and not on narcotic.  Pt states she' not working , but may get a job soon.  Pt asked when she could drive, I told her after 2 - 4 weeks, she may be in a lower surgical shoe and he may allow her short driving trips.  I told pt she would be able to fix quick meals and bathe without getting the dressings wet, after surgery.  Pt states understanding.

## 2013-09-05 ENCOUNTER — Telehealth: Payer: Self-pay | Admitting: *Deleted

## 2013-09-05 DIAGNOSIS — N6310 Unspecified lump in the right breast, unspecified quadrant: Secondary | ICD-10-CM | POA: Insufficient documentation

## 2013-09-05 NOTE — Telephone Encounter (Signed)
Pt cancelled her surgery for 09/05/2013, procedures:  Right 2, 3 Metatarsal Osteotomy, and Right 2, 3 Hammer toe repair with internal pins placement.

## 2013-09-05 NOTE — Telephone Encounter (Signed)
Cancelled patients first post op appointment

## 2013-09-12 ENCOUNTER — Encounter: Payer: BC Managed Care – PPO | Admitting: Podiatry

## 2013-09-19 ENCOUNTER — Ambulatory Visit: Payer: BC Managed Care – PPO | Admitting: Podiatry

## 2013-09-24 ENCOUNTER — Ambulatory Visit: Payer: BC Managed Care – PPO | Admitting: Podiatry

## 2013-10-22 ENCOUNTER — Telehealth: Payer: Self-pay | Admitting: *Deleted

## 2013-10-22 NOTE — Telephone Encounter (Signed)
Pt states the orthotics do not support the ball of her foot.  Please advise.

## 2013-10-23 ENCOUNTER — Telehealth: Payer: Self-pay | Admitting: *Deleted

## 2013-10-23 NOTE — Telephone Encounter (Signed)
lmom for pt to call back and schedule an appt 

## 2013-10-23 NOTE — Telephone Encounter (Signed)
Pt asked the procedures Dr Paulla Dolly would need to perform during the surgery 09/05/2013, she cancelled.  I informed her It would have been Right 2, 3 metatarsal osteotomy, and right 2, 3 hammer toe repair with internal pins.  Pt states she's filling out a form for life insurance.

## 2013-10-23 NOTE — Telephone Encounter (Signed)
Schedule pt appointment on Friday to have the issue checked. Make sure she brings her orthotics with her and the shoes she is wearing them in.

## 2013-10-24 NOTE — Telephone Encounter (Signed)
Patient scheduled for 10-26-13

## 2013-10-26 ENCOUNTER — Ambulatory Visit (INDEPENDENT_AMBULATORY_CARE_PROVIDER_SITE_OTHER): Payer: BC Managed Care – PPO | Admitting: *Deleted

## 2013-10-26 DIAGNOSIS — M779 Enthesopathy, unspecified: Secondary | ICD-10-CM

## 2013-10-29 NOTE — Patient Instructions (Signed)
Will call once new orthotics arrive.

## 2013-10-29 NOTE — Progress Notes (Signed)
Patient picked up 2nd pair of orthotics and they were completely different than her first pair. She states that they were really uncomfortable. After doing a little research, I realized that the lab made a 2nd pair for a different pat by the same name. I informed the patient I will send these back and have corrected at no charge and will notify her once her new ones arrive.

## 2013-11-19 ENCOUNTER — Encounter: Payer: Self-pay | Admitting: Podiatry

## 2013-12-05 ENCOUNTER — Ambulatory Visit (INDEPENDENT_AMBULATORY_CARE_PROVIDER_SITE_OTHER): Payer: BC Managed Care – PPO | Admitting: Podiatry

## 2013-12-05 ENCOUNTER — Ambulatory Visit: Payer: BC Managed Care – PPO | Admitting: Podiatry

## 2013-12-05 VITALS — BP 106/66 | HR 90 | Resp 16 | Ht 64.0 in | Wt 130.0 lb

## 2013-12-05 DIAGNOSIS — M7750 Other enthesopathy of unspecified foot: Secondary | ICD-10-CM

## 2013-12-05 MED ORDER — TRIAMCINOLONE ACETONIDE 10 MG/ML IJ SUSP
10.0000 mg | Freq: Once | INTRAMUSCULAR | Status: AC
  Administered 2013-12-05: 10 mg

## 2013-12-05 NOTE — Progress Notes (Signed)
Subjective:     Patient ID: Ruth Hopkins, female   DOB: April 28, 1966, 48 y.o.   MRN: [id number]  HPI patient states I know I need to get these toes fixed in these joints I am now getting some pain in the ankle that is bothering me and making it hard for me to walk comfortably. She states she wishes she would have fracture the joints last year   Review of Systems     Objective:   Physical Exam Neurovascular status intact with continued discomfort second and third metatarsophalangeal joints right over left of a moderate to significant nature and discomfort in the sinus tarsi right. 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 I did inject the sinus tarsi right 3 mg Kenalog 5 mg Xylocaine Marcaine mixture and we'll see her back in 3 weeks

## 2013-12-06 ENCOUNTER — Ambulatory Visit: Payer: BC Managed Care – PPO | Admitting: Podiatry

## 2013-12-12 ENCOUNTER — Ambulatory Visit (INDEPENDENT_AMBULATORY_CARE_PROVIDER_SITE_OTHER): Payer: BC Managed Care – PPO | Admitting: Podiatry

## 2013-12-12 ENCOUNTER — Encounter: Payer: Self-pay | Admitting: Podiatry

## 2013-12-12 VITALS — BP 109/69 | HR 90 | Resp 15 | Ht 64.0 in | Wt 132.0 lb

## 2013-12-12 DIAGNOSIS — M722 Plantar fascial fibromatosis: Secondary | ICD-10-CM

## 2013-12-12 DIAGNOSIS — M779 Enthesopathy, unspecified: Secondary | ICD-10-CM

## 2013-12-13 NOTE — Progress Notes (Signed)
Subjective:     Patient ID: Ruth Hopkins, female   DOB: [birth year], 48 y.o.   MRN: [id number]  HPI patient states I'm still getting pain in my toes and my arch if him on my foot too much. States that she is now applying for disability and if she can get this that she would like to do surgery at that time   Review of Systems     Objective:   Physical Exam Neurovascular status is intact with significant reduction discomfort within the sinus tarsi right with continued discomfort in the plantar arch region secondary to chronic stress and strain and fibromyalgia    Assessment:     Tendinitis with long-term capsulitis also noted in a patient with a history of different joint pain    Plan:     Dispensed fascially brace with instructions on usage and discussed ultimately

## 2013-12-26 ENCOUNTER — Telehealth: Payer: Self-pay | Admitting: *Deleted

## 2013-12-26 NOTE — Telephone Encounter (Signed)
Saw him 3 weeks ago.  He gave me an injection in my foot.  The same area is hurting again.  Is it okay for me to see him tomorrow?  I need another injection but not sure if it's too soon to get another one.  Do I need to wait until next week?  I called and informed her it's okay to come in tomorrow.  She said she's just going to wait until next week to come in.

## 2013-12-27 ENCOUNTER — Ambulatory Visit: Payer: BC Managed Care – PPO | Admitting: Podiatry

## 2014-01-03 ENCOUNTER — Encounter: Payer: Self-pay | Admitting: Podiatry

## 2014-01-03 ENCOUNTER — Ambulatory Visit (INDEPENDENT_AMBULATORY_CARE_PROVIDER_SITE_OTHER): Payer: BC Managed Care – PPO | Admitting: Podiatry

## 2014-01-03 VITALS — BP 126/72 | HR 84 | Resp 12

## 2014-01-03 DIAGNOSIS — M779 Enthesopathy, unspecified: Secondary | ICD-10-CM

## 2014-01-03 MED ORDER — TRIAMCINOLONE ACETONIDE 10 MG/ML IJ SUSP
10.0000 mg | Freq: Once | INTRAMUSCULAR | Status: AC
  Administered 2014-01-03: 10 mg

## 2014-01-04 NOTE — Progress Notes (Signed)
Subjective:     Patient ID: Ruth Hopkins, female   DOB: June 04, 1966, 48 y.o.   MRN: [id number]  HPI patient states that the right is doing better but that the left foot is bothering her and making it hard to walk and that she does not have any brace for this 1   Review of Systems     Objective:   Physical Exam Neurovascular status is unchanged well oriented x3 with pain in the left lateral foot with inflammation noted and also pain in the sinus tarsi of both feet still noted    Assessment:     Continues to experience inflammation and sinus tarsitis of both feet and continues to experience forefoot discomfort which is most likely related to the metatarsal phalangeal joint with chronic discomfort    Plan:     Reviewed conditions and injected the sinus tarsi both feet 3 mg Kenalog 5 mg a Marcaine mixture and dispensed a brace for the left foot in order to lift the arch area reappoint her recheck again in 4 weeks

## 2014-01-10 DIAGNOSIS — K581 Irritable bowel syndrome with constipation: Secondary | ICD-10-CM | POA: Insufficient documentation

## 2014-01-10 DIAGNOSIS — R5383 Other fatigue: Secondary | ICD-10-CM | POA: Insufficient documentation

## 2014-02-06 ENCOUNTER — Encounter: Payer: Self-pay | Admitting: Podiatry

## 2014-02-06 ENCOUNTER — Ambulatory Visit (INDEPENDENT_AMBULATORY_CARE_PROVIDER_SITE_OTHER): Payer: BC Managed Care – PPO | Admitting: Podiatry

## 2014-02-06 VITALS — BP 109/66 | HR 81 | Resp 16

## 2014-02-06 DIAGNOSIS — M2040 Other hammer toe(s) (acquired), unspecified foot: Secondary | ICD-10-CM

## 2014-02-06 DIAGNOSIS — M779 Enthesopathy, unspecified: Secondary | ICD-10-CM

## 2014-02-06 NOTE — Progress Notes (Signed)
   Subjective:    Patient ID: Ruth Hopkins, female    DOB: 1966-06-27, 48 y.o.   MRN: [id number]  HPI Pain in both feet and ankles, " they hurt everywhere" , right is worse than left. Experriencing pain since January this year.    Review of Systems     Objective:   Physical Exam        Assessment & Plan:

## 2014-02-06 NOTE — Progress Notes (Signed)
Subjective:     Patient ID: Ruth Hopkins, female   DOB: 11/18/1965, 48 y.o.   MRN: [id number]  HPI patient continues to experience forefoot pain right over left and pain in the sinus tarsi both feet if she has been ambulating for a long time. 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 this will give her long-term relief and she may always have trouble with her feet. She understands all this and wants surgery signs consent form after review understanding total recovery. Can take 6 months to one year. Proposed procedures digital fusion with hopeful internal pin second and third toes and shortening osteotomy second and third metatarsals with screw fixation

## 2014-02-07 ENCOUNTER — Ambulatory Visit: Payer: BC Managed Care – PPO | Admitting: Podiatry

## 2014-03-04 ENCOUNTER — Other Ambulatory Visit: Payer: Self-pay | Admitting: Dermatology

## 2014-03-11 ENCOUNTER — Telehealth: Payer: Self-pay | Admitting: *Deleted

## 2014-03-11 NOTE — Telephone Encounter (Signed)
I called and informed her that Dr. Paulla Dolly said that she could get an injection.  I told her unfortunately all the schedulers have gone for the day.  I asked if she could call tomorrow to schedule an appointment.  She said she would call in the morning.

## 2014-03-11 NOTE — Telephone Encounter (Signed)
Calling to talk about getting another injection in my foot.  I can't have surgery done until September.  That's when I get Medicare.  It'll be less expensive.  Can I get an injection now?

## 2014-03-13 ENCOUNTER — Ambulatory Visit (INDEPENDENT_AMBULATORY_CARE_PROVIDER_SITE_OTHER): Payer: BC Managed Care – PPO | Admitting: Podiatry

## 2014-03-13 DIAGNOSIS — L600 Ingrowing nail: Secondary | ICD-10-CM

## 2014-03-13 DIAGNOSIS — M779 Enthesopathy, unspecified: Secondary | ICD-10-CM

## 2014-03-13 MED ORDER — TRIAMCINOLONE ACETONIDE 10 MG/ML IJ SUSP
10.0000 mg | Freq: Once | INTRAMUSCULAR | Status: AC
  Administered 2014-03-13: 10 mg

## 2014-03-13 NOTE — Progress Notes (Signed)
   Subjective:    Patient ID: Ruth Hopkins, female    DOB: Jun 04, 1966, 48 y.o.   MRN: [id number]  HPI Right foot pain/PF pt requesting injection. Left great toe pain, possible ingrown, was treated 3 months ago at another office for ingrown and believes it has grown back since.   Review of Systems     Objective:   Physical Exam        Assessment & Plan:

## 2014-03-14 ENCOUNTER — Ambulatory Visit: Payer: BC Managed Care – PPO | Admitting: Podiatry

## 2014-03-14 NOTE — Progress Notes (Signed)
Subjective:     Patient ID: Ruth Hopkins, female   DOB: [birth year], 48 y.o.   MRN: [id number]  HPI patient states that she is going to have surgery in September but is still having pain in her right ankle is concerned she may have an ingrown toenail on her left big toe   Review of Systems     Objective:   Physical Exam Neurovascular status intact with moderate discomfort in the sinus tarsi right and forefoot inflammation around the second and third metatarsophalangeal joints that remains tender and mild incurvation of medial border left hallux nail bed    Assessment:     Sinus tarsitis right with forefoot inflammation and also ingrown toenail of a moderate nature left hallux    Plan:     Discussed all conditions and at this point did inject the sinus tarsi right 3 mg Kenalog 5 mm rim Xylocaine Marcaine mixture and soak the ingrown toenail and if he gets worse we'll have to consider removal of the corner

## 2014-04-11 ENCOUNTER — Ambulatory Visit: Payer: BC Managed Care – PPO | Admitting: Podiatry

## 2014-05-02 ENCOUNTER — Telehealth: Payer: Self-pay | Admitting: *Deleted

## 2014-05-02 NOTE — Telephone Encounter (Signed)
I need to schedule surgery he wants to do for me.  I'm hoping to get it done September 1, which is a Tuesday.  Give me a call.  He wants to see me before.

## 2014-05-02 NOTE — Telephone Encounter (Signed)
I called and left her a message that I will tentatively put her down for 05/21/2014 for surgery.  When you come in on 05/13/2014, we will call and schedule at the surgical center.  I don't know what the procedure will be at this time.  Call if you have any further questions.

## 2014-05-13 ENCOUNTER — Encounter: Payer: Self-pay | Admitting: Podiatry

## 2014-05-13 ENCOUNTER — Ambulatory Visit (INDEPENDENT_AMBULATORY_CARE_PROVIDER_SITE_OTHER): Payer: BC Managed Care – PPO | Admitting: Podiatry

## 2014-05-13 VITALS — BP 104/65 | HR 80 | Resp 12

## 2014-05-13 DIAGNOSIS — M2040 Other hammer toe(s) (acquired), unspecified foot: Secondary | ICD-10-CM

## 2014-05-13 DIAGNOSIS — M7750 Other enthesopathy of unspecified foot: Secondary | ICD-10-CM

## 2014-05-14 ENCOUNTER — Telehealth: Payer: Self-pay | Admitting: *Deleted

## 2014-05-14 NOTE — Progress Notes (Signed)
Subjective:     Patient ID: Ruth Hopkins, female   DOB: [birth year], 48 y.o.   MRN: [id number]  HPI patient presents today for consult concerning chronic discomfort of the second and third metatarsophalangeal joints of the right foot that we have been trying to fix for a long time. Also has discomfort in the ankle right and moderate discomfort in the remaining foot but the intense discomfort exist within these joints   Review of Systems     Objective:   Physical Exam Neurovascular status intact with muscle strength adequate and found to have intense discomfort second and third metatarsal phalangeal joint right with elevated digits and moderate elongation of the second and third metatarsal heads. 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 I explained that there is no guarantees that this will cure all of her pain and that she may be left with quite a bit of discomfort in different areas in her foot and even still in these joints. At this time I allowed her to read a consent form discussing the procedures and reviewing all possible complications and after review she signed. I did discuss the told recovery period to 6 months to one year and some elevation of the digits is a distinct possibility secondary to the procedure area she is scheduled for outpatient surgery in the next 2 weeks and is encouraged to call with any questions

## 2014-05-14 NOTE — Telephone Encounter (Signed)
Supposed to be scheduled for surgery on September 1.  I called the [facility] and they said they don't see a referral from your office.  They said the person that writes it down may not have it wrote it down.  Can you check and make sure the referral has been made for September 1, thank you.

## 2014-05-15 NOTE — Telephone Encounter (Signed)
I returned her call and informed her that we did schedule her surgery at the surgical center.  She asked what time will it be.  I told her the surgical center will call with the exact time, all I can tell you is that it will be sometime that morning.  She stated she needed to ask about her anti-inflammatory when to stop it.  I told her to ask them when they called. She asked when will I be able to walk normal?  I told her I can't give a definite time span.  It takes 6-8 weeks for actual bone healing. I told her she will be able to get up and do normal things such as go to the bathroom and get something to eat but no excessive walking.  She stated, okay thanks for calling.

## 2014-05-21 ENCOUNTER — Encounter: Payer: Self-pay | Admitting: Podiatry

## 2014-05-21 ENCOUNTER — Telehealth: Payer: Self-pay | Admitting: *Deleted

## 2014-05-21 DIAGNOSIS — M779 Enthesopathy, unspecified: Secondary | ICD-10-CM

## 2014-05-21 DIAGNOSIS — M2040 Other hammer toe(s) (acquired), unspecified foot: Secondary | ICD-10-CM

## 2014-05-21 DIAGNOSIS — M21549 Acquired clubfoot, unspecified foot: Secondary | ICD-10-CM

## 2014-05-21 NOTE — Telephone Encounter (Signed)
I called and informed her it is okay to start back on the anti-inflammatory per Dr. Paulla Dolly.  She stated already, okay.  She asked, How long will I be wearing the boot after surgery?  I told her for probably a week or two.  She asked,Then what will I wear?  I told her a surgical shoe.  She said which I don't have yet.  I told her correct.  She stated thank you for calling me back.

## 2014-05-21 NOTE — Telephone Encounter (Signed)
I had surgery this morning.  Is it okay for me to start back taking my anti-inflammatory that I take for Fibromyalgia?  I told her I will check with Dr. Paulla Dolly and give you a call back.

## 2014-05-22 ENCOUNTER — Telehealth: Payer: Self-pay

## 2014-05-22 NOTE — Telephone Encounter (Signed)
Spoke with pt reagrding post operative status, she states that the numbness is wearing off and that she is afraid she may have "over done it" I advised her to rest today, keep foot elevated, advised to watch for signs of active bleeding and alert [place] of any n/v, chills and fever

## 2014-05-23 NOTE — Progress Notes (Signed)
Dr Paulla Dolly performed a right met osteotomy on 2nd and 3rd met, hammertoe repair on right 2nd and 3rd met DOS 05/21/14

## 2014-05-29 ENCOUNTER — Telehealth: Payer: Self-pay | Admitting: *Deleted

## 2014-05-29 ENCOUNTER — Encounter: Payer: Self-pay | Admitting: Podiatry

## 2014-05-29 ENCOUNTER — Ambulatory Visit (INDEPENDENT_AMBULATORY_CARE_PROVIDER_SITE_OTHER): Payer: BC Managed Care – PPO | Admitting: Podiatry

## 2014-05-29 ENCOUNTER — Encounter: Payer: BC Managed Care – PPO | Admitting: Podiatry

## 2014-05-29 ENCOUNTER — Ambulatory Visit (INDEPENDENT_AMBULATORY_CARE_PROVIDER_SITE_OTHER): Payer: BC Managed Care – PPO

## 2014-05-29 VITALS — BP 109/70 | HR 84 | Resp 16

## 2014-05-29 DIAGNOSIS — M2040 Other hammer toe(s) (acquired), unspecified foot: Secondary | ICD-10-CM

## 2014-05-29 NOTE — Telephone Encounter (Signed)
Dr. Paulla Dolly did surgery a week ago.  I came in to see him today.  I forgot to ask him if I could sleep with my foot under the cover.  lt has pins at the tips of my second and third toe.  I been having trouble sleeping with my foot on the outside.  I just wanted to call and see what you think.

## 2014-05-29 NOTE — Telephone Encounter (Signed)
I called and left her a message that it is okay to sleep with foot under the cover.  Call if you have any further questions.

## 2014-05-29 NOTE — Telephone Encounter (Signed)
I received a message from one of the nurses.  I just want to make sure she knows that I do have pins on the tip of my second and third toes.  The doctor said to be really careful with so  I'm not sure about putting my feet under the cover at night,  She said I could.  I want to be sure.  Please give me a call back.

## 2014-05-30 NOTE — Telephone Encounter (Signed)
I saw Dr. Paulla Dolly yesterday and he gave me a shoe to wear.  I guess he thought I would be able to wear it.  I'm worried because I have Fibromyalgia and I can't wear the shoe. It hurts way too bad to wear it.  I may have had more cushion in it then I do now.   I'm wondering if I need to come in and get something done.  Please give me a call, thank you.

## 2014-05-30 NOTE — Progress Notes (Signed)
Subjective:     Patient ID: Ruth Hopkins, female   DOB: [birth year], 48 y.o.   MRN: [id number]  HPI patient states I'm doing very well with my right foot and able to walk without discomfort or swelling   Review of Systems     Objective:   Physical Exam Neurovascular status intact with pins in place right second and third toes in good alignment with wound edges well coapted and no indications of movement    Assessment:     Doing well post digital fusion digits 23 right and osteotomy metatarsals 23 right    Plan:     Reviewed x-rays reapplied sterile dressings and gave instructions for digital splinting. Reappoint pressed her recheck again in the next several weeks for pin removal and gave strict instructions if there should be any changes or swelling drainage she is to let us know immediately

## 2014-06-03 ENCOUNTER — Other Ambulatory Visit: Payer: Self-pay | Admitting: Obstetrics and Gynecology

## 2014-06-04 LAB — CYTOLOGY - PAP

## 2014-06-14 NOTE — Telephone Encounter (Signed)
Stay in boot or surgical shoe if not ready for shoe

## 2014-06-19 NOTE — Telephone Encounter (Signed)
I called and left her a message to continue to wear the boot if you can't tolerate the shoe.  Call if you have any further questions or concerns.

## 2014-06-20 ENCOUNTER — Ambulatory Visit: Payer: BC Managed Care – PPO | Admitting: Podiatry

## 2014-06-20 ENCOUNTER — Ambulatory Visit (INDEPENDENT_AMBULATORY_CARE_PROVIDER_SITE_OTHER): Payer: Medicare Other | Admitting: Podiatry

## 2014-06-20 ENCOUNTER — Encounter: Payer: Self-pay | Admitting: Podiatry

## 2014-06-20 ENCOUNTER — Ambulatory Visit (INDEPENDENT_AMBULATORY_CARE_PROVIDER_SITE_OTHER): Payer: Medicare Other

## 2014-06-20 VITALS — BP 107/70 | HR 79 | Resp 16

## 2014-06-20 DIAGNOSIS — M2041 Other hammer toe(s) (acquired), right foot: Secondary | ICD-10-CM | POA: Diagnosis not present

## 2014-06-20 NOTE — Progress Notes (Signed)
Subjective:     Patient ID: Ruth Hopkins, female   DOB: [birth year], 48 y.o.   MRN: [id number]  HPI patient presents stating I was just concerned about the second and third toes and whether they or lifting   Review of Systems     Objective:   Physical Exam Neurovascular status intact negative Homans sign with well-healed surgical site second and third toes and second and third metatarsals right foot    Assessment:     Improved from foot surgery right with pins in place and concerned about elevation    Plan:     X-rays indicate good alignment which I convey to patient and patient will be seen back for pin removal in 1 week sterile dressing will be continued to be used

## 2014-06-26 ENCOUNTER — Encounter: Payer: Self-pay | Admitting: Podiatry

## 2014-06-26 ENCOUNTER — Ambulatory Visit (INDEPENDENT_AMBULATORY_CARE_PROVIDER_SITE_OTHER): Payer: Medicare Other | Admitting: Podiatry

## 2014-06-26 ENCOUNTER — Ambulatory Visit (INDEPENDENT_AMBULATORY_CARE_PROVIDER_SITE_OTHER): Payer: Medicare Other

## 2014-06-26 VITALS — BP 112/78 | HR 74 | Resp 16

## 2014-06-26 DIAGNOSIS — M2041 Other hammer toe(s) (acquired), right foot: Secondary | ICD-10-CM

## 2014-06-26 DIAGNOSIS — Z9889 Other specified postprocedural states: Secondary | ICD-10-CM

## 2014-06-26 NOTE — Progress Notes (Signed)
Subjective:     Patient ID: Ruth Hopkins, female   DOB: [birth year], 48 y.o.   MRN: [id number]  HPI patient states I'm doing pretty well with pain of a minimal nature and able to ambulate well with my big   Review of Systems     Objective:   Physical Exam Neurovascular status intact negative Homans sign noted with pins in good alignment second and third toe right foot and incision site healing well with wound edges well coapted    Assessment:     Improving from digital fusion digits 23 and shortening osteotomy second and third right foot    Plan:     H&P performed and today I reviewed the x-rays and advised her on slow return to shoe. This was done after pins are removed and sterile dressings were applied to the ends of the toes reappoint 4 weeks earlier if necessary

## 2014-06-27 ENCOUNTER — Ambulatory Visit: Payer: BC Managed Care – PPO | Admitting: Podiatry

## 2014-07-08 ENCOUNTER — Encounter: Payer: Self-pay | Admitting: Podiatry

## 2014-07-08 ENCOUNTER — Ambulatory Visit (INDEPENDENT_AMBULATORY_CARE_PROVIDER_SITE_OTHER): Payer: Medicare Other

## 2014-07-08 ENCOUNTER — Ambulatory Visit (INDEPENDENT_AMBULATORY_CARE_PROVIDER_SITE_OTHER): Payer: Medicare Other | Admitting: Podiatry

## 2014-07-08 VITALS — BP 105/62 | HR 66 | Resp 16

## 2014-07-08 DIAGNOSIS — G8918 Other acute postprocedural pain: Secondary | ICD-10-CM

## 2014-07-08 DIAGNOSIS — M216X9 Other acquired deformities of unspecified foot: Secondary | ICD-10-CM

## 2014-07-08 DIAGNOSIS — L600 Ingrowing nail: Secondary | ICD-10-CM

## 2014-07-08 NOTE — Progress Notes (Signed)
Subjective:     Patient ID: Ruth Hopkins, female   DOB: [birth year], 48 y.o.   MRN: [id number]  HPI patient states that I'm still having pain and swelling and I just wanted to make sure everything was okay   Review of Systems     Objective:   Physical Exam Neurovascular status intact with no muscle strength issues and patient noted to have good position of the toes with slight tightness across the MPJs 2 and 3    Assessment:     Normal to have some inflammation and discomfort secondary to the types of procedures performed    Plan:     Gave instructions on aggressive physical therapy and shoe gear usage to be continued. Reappoint to recheck again for regular visit earlier if any issues should occur

## 2014-07-08 NOTE — Progress Notes (Signed)
   Subjective:    Patient ID: Ruth Hopkins, female    DOB: 1966/04/02, 48 y.o.   MRN: [id number]  HPI Pt presents stating she has right foot pain, throbbing dull achey pain,she denies this being a new pain, states that her foot has been painful intermittently since surgery and since she has graduated out of the walking boot into a regular show, also states that she cut her right great toenail yesterday and it is now painful and she believes its ingrown  Review of Systems     Objective:   Physical Exam        Assessment & Plan:

## 2014-07-24 ENCOUNTER — Ambulatory Visit: Payer: Medicare Other | Admitting: Podiatry

## 2014-07-24 ENCOUNTER — Ambulatory Visit (INDEPENDENT_AMBULATORY_CARE_PROVIDER_SITE_OTHER): Payer: Medicare Other

## 2014-07-24 VITALS — BP 101/68 | HR 78 | Resp 16

## 2014-07-24 DIAGNOSIS — L600 Ingrowing nail: Secondary | ICD-10-CM

## 2014-07-24 DIAGNOSIS — M2041 Other hammer toe(s) (acquired), right foot: Secondary | ICD-10-CM | POA: Diagnosis not present

## 2014-07-24 NOTE — Patient Instructions (Addendum)

## 2014-07-26 NOTE — Progress Notes (Signed)
Subjective:     Patient ID: Ruth Hopkins, female   DOB: 1966/05/30, 48 y.o.   MRN: [id number]  HPIpatient presents stating my right foot the toes are still bother me and I just wanted to get them checked and I been getting a little bit of pain also on my left foot   Review of Systems     Objective:   Physical Exam  neurovascular status intact with digits which appear to be in good alignment with slight stiffness as is normal for this. Postop with good correction of the underlying structure and minimal discomfort in the plantar metatarsal phalangeal joint when palpated. Left foot has mild discomfort on the outside of the foot with no increased edema    Assessment:     Improve digital procedures second and third right with probable compensatory tendinitis left which is mild in nature    Plan:     I did re-x-ray and reviewed and advised on the importance of continued plantar flexion of the second and third toes and I did dispensed tape and she's having trouble using the digital splint

## 2014-08-19 ENCOUNTER — Ambulatory Visit (INDEPENDENT_AMBULATORY_CARE_PROVIDER_SITE_OTHER): Payer: Medicare Other | Admitting: Podiatry

## 2014-08-19 ENCOUNTER — Encounter: Payer: Self-pay | Admitting: Podiatry

## 2014-08-19 ENCOUNTER — Ambulatory Visit (INDEPENDENT_AMBULATORY_CARE_PROVIDER_SITE_OTHER): Payer: Medicare Other

## 2014-08-19 VITALS — BP 100/63 | HR 82 | Resp 16

## 2014-08-19 DIAGNOSIS — M2041 Other hammer toe(s) (acquired), right foot: Secondary | ICD-10-CM | POA: Diagnosis not present

## 2014-08-19 DIAGNOSIS — M779 Enthesopathy, unspecified: Secondary | ICD-10-CM

## 2014-08-19 NOTE — Progress Notes (Signed)
Subjective:     Patient ID: Ruth Hopkins, female   DOB: 01-Dec-1965, 48 y.o.   MRN: [id number]  HPI patient states the pain is decreasing quite a bit but I still have discomfort if I been on my foot a lot   Review of Systems     Objective:   Physical Exam Neurovascular status intact with digits and good alignment second and third with mild elevation and incision site second and third toes second and third metatarsals that are healing well    Assessment:     Is doing well with still mild edema and elevation of the second and third digits    Plan:     Advised on continued stretching exercises and hopeful the toes will lower little bit further as edema reduces. No discomfort which is an excellent sign for this patient

## 2014-08-21 ENCOUNTER — Encounter: Payer: BC Managed Care – PPO | Admitting: Podiatry

## 2014-09-16 ENCOUNTER — Encounter: Payer: Self-pay | Admitting: Podiatry

## 2014-09-16 ENCOUNTER — Ambulatory Visit (INDEPENDENT_AMBULATORY_CARE_PROVIDER_SITE_OTHER): Payer: Medicare Other | Admitting: Podiatry

## 2014-09-16 ENCOUNTER — Ambulatory Visit (INDEPENDENT_AMBULATORY_CARE_PROVIDER_SITE_OTHER): Payer: Medicare Other

## 2014-09-16 VITALS — BP 106/69 | HR 86 | Resp 16

## 2014-09-16 DIAGNOSIS — M779 Enthesopathy, unspecified: Secondary | ICD-10-CM

## 2014-09-16 DIAGNOSIS — M2041 Other hammer toe(s) (acquired), right foot: Secondary | ICD-10-CM | POA: Diagnosis not present

## 2014-09-16 DIAGNOSIS — G8918 Other acute postprocedural pain: Secondary | ICD-10-CM

## 2014-09-16 MED ORDER — TRIAMCINOLONE ACETONIDE 10 MG/ML IJ SUSP
10.0000 mg | Freq: Once | INTRAMUSCULAR | Status: AC
  Administered 2014-09-16: 10 mg

## 2014-09-18 ENCOUNTER — Ambulatory Visit: Payer: BC Managed Care – PPO | Admitting: Podiatry

## 2014-09-18 NOTE — Progress Notes (Signed)
Subjective:     Patient ID: Ruth Hopkins, female   DOB: Sep 04, 1966, 48 y.o.   MRN: [id number]  HPI patient states my forefoot right is doing pretty well but I'm getting some pain in the ankle again as I been walking differently and also in general I think I need new orthotics as they have been wearing out   Review of Systems     Objective:   Physical Exam Neurovascular status intact with discomfort in the sinus tarsi right with inflammation noted and forefoot healing well with continued restriction of motion the second and third MPJs which should gradually improve over the next 6 months and has shown improvement over the last 2 months    Assessment:     Forefoot capsulitis which is improving secondary to structural correction with inflammation of the sinus tarsi right which is probably due to moderate gait change    Plan:     Reviewed condition and at this time injected the sinus tarsi right 3 mg Kenalog 5 mg Xylocaine and scanned for custom orthotics to reduce stress against the feet. Reappoint when orthotics returned

## 2014-10-21 ENCOUNTER — Encounter: Payer: BC Managed Care – PPO | Admitting: Podiatry

## 2014-11-07 ENCOUNTER — Encounter: Payer: Self-pay | Admitting: Podiatry

## 2014-11-07 ENCOUNTER — Ambulatory Visit (INDEPENDENT_AMBULATORY_CARE_PROVIDER_SITE_OTHER): Payer: BLUE CROSS/BLUE SHIELD | Admitting: Podiatry

## 2014-11-07 VITALS — BP 113/74 | HR 80 | Resp 16

## 2014-11-07 DIAGNOSIS — M779 Enthesopathy, unspecified: Secondary | ICD-10-CM | POA: Diagnosis not present

## 2014-11-07 DIAGNOSIS — M2041 Other hammer toe(s) (acquired), right foot: Secondary | ICD-10-CM

## 2014-11-07 MED ORDER — TRIAMCINOLONE ACETONIDE 10 MG/ML IJ SUSP
10.0000 mg | Freq: Once | INTRAMUSCULAR | Status: AC
  Administered 2014-11-07: 10 mg

## 2014-11-07 NOTE — Progress Notes (Signed)
Subjective:     Patient ID: Ruth Hopkins, female   DOB: Apr 05, 1966, 49 y.o.   MRN: [id number]  HPI patient states I want to start exercising but I'm getting this pain in my right ankle that's just preventing me from doing that. States the toes are feeling better and the pain in her forefoot is resolving   Review of Systems     Objective:   Physical Exam Neurovascular status intact no change in health history with forefoot discomfort which continues to improve and discomfort in the sinus tarsi into the lateral ankle gutter right that's present    Assessment:     Continuation of ankle pain right but it has moved in a slightly more posterior direction with improvement of the forefoot secondary to surgery    Plan:     I discussed that I could only do these injections occasionally and since she is starting to exercise and needs to be active we will give her one today that's very discrete to this area. I did explain the risk of the injections. I injected with 3 mg Dexon some Kenalog 3 mg Xylocaine into the lateral ankle gutter in a slightly different position where we have done at before and she'll reappoint as needed and was dispensed orthotics today

## 2014-12-05 ENCOUNTER — Ambulatory Visit: Payer: Medicare Other | Admitting: Podiatry

## 2014-12-10 ENCOUNTER — Ambulatory Visit (INDEPENDENT_AMBULATORY_CARE_PROVIDER_SITE_OTHER): Payer: BLUE CROSS/BLUE SHIELD | Admitting: Podiatry

## 2014-12-10 ENCOUNTER — Encounter: Payer: Self-pay | Admitting: Podiatry

## 2014-12-10 ENCOUNTER — Ambulatory Visit (INDEPENDENT_AMBULATORY_CARE_PROVIDER_SITE_OTHER): Payer: BLUE CROSS/BLUE SHIELD

## 2014-12-10 VITALS — BP 109/74 | HR 78 | Resp 18

## 2014-12-10 DIAGNOSIS — T814XXA Infection following a procedure, initial encounter: Secondary | ICD-10-CM

## 2014-12-10 DIAGNOSIS — R52 Pain, unspecified: Secondary | ICD-10-CM | POA: Diagnosis not present

## 2014-12-10 DIAGNOSIS — Q667 Congenital pes cavus: Secondary | ICD-10-CM | POA: Diagnosis not present

## 2014-12-10 DIAGNOSIS — T8140XA Infection following a procedure, unspecified, initial encounter: Secondary | ICD-10-CM

## 2014-12-10 DIAGNOSIS — Z09 Encounter for follow-up examination after completed treatment for conditions other than malignant neoplasm: Secondary | ICD-10-CM

## 2014-12-10 DIAGNOSIS — M779 Enthesopathy, unspecified: Secondary | ICD-10-CM

## 2014-12-10 DIAGNOSIS — M216X9 Other acquired deformities of unspecified foot: Secondary | ICD-10-CM

## 2014-12-11 NOTE — Progress Notes (Signed)
Subjective:     Patient ID: Ruth Hopkins, female   DOB: [birth year], 49 y.o.   MRN: [id number]  HPI patient presents stating I still gets some pain in my feet and I wanted you orthotics. States that her feet can still swelling at times and she gets pain mostly around the ankle and foot but she just was concerned about her surgery   Review of Systems     Objective:   Physical Exam Neurovascular status intact with digits and recently good alignment second and third right with mild stiffness which hopefully get better as time goes on with discomfort around the ankle joint and into the midtarsal joint of both feet with no specific inflammatory process is noted    Assessment:     Probable fibromyalgia with inflammatory changes of the midfoot and ankle area right over left    Plan:     Reviewed condition and reviewed x-rays and at this time I recommended new orthotics which were dispensed and she states makes her feel better. Also she will do physical therapy on these areas and change her types of activities and will be seen back to recheck in the next several months

## 2015-01-20 ENCOUNTER — Ambulatory Visit (INDEPENDENT_AMBULATORY_CARE_PROVIDER_SITE_OTHER): Payer: BLUE CROSS/BLUE SHIELD | Admitting: Podiatry

## 2015-01-20 ENCOUNTER — Encounter: Payer: Self-pay | Admitting: Podiatry

## 2015-01-20 DIAGNOSIS — T8140XA Infection following a procedure, unspecified, initial encounter: Secondary | ICD-10-CM

## 2015-01-20 DIAGNOSIS — T814XXA Infection following a procedure, initial encounter: Secondary | ICD-10-CM

## 2015-01-21 NOTE — Progress Notes (Signed)
Subjective:     Patient ID: Ruth Hopkins, female   DOB: 1966-05-03, 49 y.o.   MRN: [id number]  HPI patient states she was just concerned about her foot because the orthotics don't seem to fit her as well   Review of Systems     Objective:   Physical Exam Neurovascular status intact with orthotics which and it may not quite be in the right alignment for her    Assessment:     Orthotics which are not fitted properly currently    Plan:     We will realigned the orthotics and return them to her when they are finished1`

## 2015-03-19 ENCOUNTER — Ambulatory Visit (INDEPENDENT_AMBULATORY_CARE_PROVIDER_SITE_OTHER): Payer: BLUE CROSS/BLUE SHIELD | Admitting: Podiatry

## 2015-03-19 ENCOUNTER — Encounter: Payer: Self-pay | Admitting: Podiatry

## 2015-03-19 VITALS — BP 99/65 | HR 77 | Resp 15

## 2015-03-19 DIAGNOSIS — M779 Enthesopathy, unspecified: Secondary | ICD-10-CM

## 2015-03-19 MED ORDER — TRIAMCINOLONE ACETONIDE 10 MG/ML IJ SUSP
10.0000 mg | Freq: Once | INTRAMUSCULAR | Status: AC
  Administered 2015-03-19: 10 mg

## 2015-03-19 NOTE — Patient Instructions (Signed)

## 2015-03-20 NOTE — Progress Notes (Signed)
Subjective:     Patient ID: Ruth Hopkins, female   DOB: [birth year], 49 y.o.   MRN: [id number]  HPI patient states I was doing pretty well and that I started to develop pain in my right ankle and it's become inflamed. The toes themselves are feeling pretty good   Review of Systems     Objective:   Physical Exam Neurovascular status unchanged with incision site right second and third metatarsal that are doing well with good alignment and inflammation pain in the right sinus tarsi with fluid buildup    Assessment:     Reviewed condition and did careful sinus tarsi injection 3 mg Kenalog 5 mg Xylocaine    Plan:     Sinus tarsitis right which was injected and tolerated well today

## 2015-04-24 ENCOUNTER — Ambulatory Visit (INDEPENDENT_AMBULATORY_CARE_PROVIDER_SITE_OTHER): Payer: BLUE CROSS/BLUE SHIELD | Admitting: Podiatry

## 2015-04-24 ENCOUNTER — Encounter: Payer: Self-pay | Admitting: Podiatry

## 2015-04-24 DIAGNOSIS — R52 Pain, unspecified: Secondary | ICD-10-CM | POA: Diagnosis not present

## 2015-04-24 DIAGNOSIS — M779 Enthesopathy, unspecified: Secondary | ICD-10-CM | POA: Diagnosis not present

## 2015-04-24 NOTE — Progress Notes (Signed)
Subjective:     Patient ID: Ruth Hopkins, female   DOB: 18-Apr-1966, 49 y.o.   MRN: [id number]  HPI patient presents with problems with orthotics bilateral stating that she has tried to modify her gait and that they are still giving her trouble   Review of Systems     Objective:   Physical Exam Neurovascular status intact with moderate depression of the arch bilateral with discomfort noted in the mid arch area bilateral    Assessment:     Continue tendinitis-like symptoms    Plan:     Reviewed condition and discussed treatment options were to try to change the orthotics to reduce stress against her feet. Patient will be seen back when they are ready

## 2015-06-10 ENCOUNTER — Other Ambulatory Visit: Payer: Self-pay

## 2015-06-11 LAB — CYTOLOGY - PAP

## 2015-06-19 ENCOUNTER — Ambulatory Visit (INDEPENDENT_AMBULATORY_CARE_PROVIDER_SITE_OTHER): Payer: BLUE CROSS/BLUE SHIELD | Admitting: Podiatry

## 2015-06-19 ENCOUNTER — Encounter: Payer: Self-pay | Admitting: Podiatry

## 2015-06-19 VITALS — BP 113/69 | HR 76 | Resp 16

## 2015-06-19 DIAGNOSIS — M779 Enthesopathy, unspecified: Secondary | ICD-10-CM | POA: Diagnosis not present

## 2015-06-19 MED ORDER — TRIAMCINOLONE ACETONIDE 10 MG/ML IJ SUSP
10.0000 mg | Freq: Once | INTRAMUSCULAR | Status: AC
  Administered 2015-06-19: 10 mg

## 2015-06-19 NOTE — Progress Notes (Signed)
Subjective:     Patient ID: Ruth Hopkins, female   DOB: October 21, 1965, 49 y.o.   MRN: [id number]  HPI patient states my ankle joint has started to hurt me right again and I'm looking to have my orthotics revision   Review of Systems     Objective:   Physical Exam Neurovascular status intact second third digits right and good alignment with minimal discomfort in the metatarsophalangeal joints with continued discomfort in the sinus tarsi right which has just started up again. Patient orthotics appear to have a thicker plantar pad noted    Assessment:     Inflammatory sinus tarsitis right with fibromyalgia as part of the problem and metatarsal phalangeal joint mild discomfort    Plan:     Reviewed both conditions and did a careful sinus tarsi injection right 3 mg Kenalog 5 mg Xylocaine and advised on physical therapy and orthotic therapy which will be re-evaluated. Reappoint to recheck

## 2015-08-20 ENCOUNTER — Ambulatory Visit: Payer: BLUE CROSS/BLUE SHIELD | Admitting: Podiatry

## 2015-08-21 ENCOUNTER — Ambulatory Visit (INDEPENDENT_AMBULATORY_CARE_PROVIDER_SITE_OTHER): Payer: BLUE CROSS/BLUE SHIELD | Admitting: Podiatry

## 2015-08-21 ENCOUNTER — Encounter: Payer: Self-pay | Admitting: Podiatry

## 2015-08-21 DIAGNOSIS — B078 Other viral warts: Secondary | ICD-10-CM

## 2015-08-21 DIAGNOSIS — B079 Viral wart, unspecified: Secondary | ICD-10-CM | POA: Diagnosis not present

## 2015-08-21 DIAGNOSIS — M779 Enthesopathy, unspecified: Secondary | ICD-10-CM

## 2015-08-22 NOTE — Progress Notes (Signed)
Subjective:     Patient ID: Ruth Hopkins, female   DOB: [birth year], 49 y.o.   MRN: [id number]  HPI patient states that she still having pain and she's just concerned about her orthotics and her long-term   Review of Systems     Objective:   Physical Exam Neurovascular status unchanged with patient complaining of discomfort in the forefoot left over right with also concerned about nail disease of the big nail right    Assessment:     Chronic inflammatory capsulitis which may be related to fibromyalgia along with nail disease right big toe    Plan:     Reviewed both conditions and we will modify orthotics again and today I do not recommend treatment for the nail as it is localized and the nail does function well

## 2015-08-27 ENCOUNTER — Ambulatory Visit: Payer: BLUE CROSS/BLUE SHIELD | Admitting: Podiatry

## 2015-08-28 ENCOUNTER — Ambulatory Visit: Payer: BLUE CROSS/BLUE SHIELD | Admitting: Podiatry

## 2015-09-17 ENCOUNTER — Telehealth: Payer: Self-pay | Admitting: *Deleted

## 2015-09-17 NOTE — Telephone Encounter (Signed)
Pt states she had a few more questions she needed for her paper work: 1. How many times was she x-rayed this year, and if both feet? 2. How many times did she see Dr. Paulla Dolly this year?  I told pt she had both feet x-rayed 12/10/2014, and she had seen him 6 times this year.

## 2015-09-17 NOTE — Telephone Encounter (Signed)
Pt request the type of injections given by Dr. Paulla Dolly and diagnosis.  Informed pt the injections were combinations of Kenalog and Xylocaine and the diagnosis of capsulitis, tendonitis, Hammer toes, enthesopathy of unspecific. Pt asked when the last injection was, informed pt 05/2015, pt requested an appt. Transferred.

## 2015-09-29 ENCOUNTER — Ambulatory Visit: Payer: BLUE CROSS/BLUE SHIELD | Admitting: Podiatry

## 2015-10-06 ENCOUNTER — Ambulatory Visit: Payer: BLUE CROSS/BLUE SHIELD | Admitting: Podiatry

## 2015-10-20 DIAGNOSIS — M779 Enthesopathy, unspecified: Secondary | ICD-10-CM

## 2015-11-03 ENCOUNTER — Encounter: Payer: Self-pay | Admitting: Podiatry

## 2015-11-03 ENCOUNTER — Ambulatory Visit (INDEPENDENT_AMBULATORY_CARE_PROVIDER_SITE_OTHER): Payer: BLUE CROSS/BLUE SHIELD | Admitting: Podiatry

## 2015-11-03 VITALS — BP 109/69 | HR 72 | Resp 12

## 2015-11-03 DIAGNOSIS — M779 Enthesopathy, unspecified: Secondary | ICD-10-CM

## 2015-11-03 MED ORDER — TRIAMCINOLONE ACETONIDE 10 MG/ML IJ SUSP
10.0000 mg | Freq: Once | INTRAMUSCULAR | Status: AC
  Administered 2015-11-03: 10 mg

## 2015-11-04 NOTE — Progress Notes (Signed)
Subjective:     Patient ID: Ruth Hopkins, female   DOB: 1966/09/06, 50 y.o.   MRN: [id number]  HPI patient presents stating my ankles that started to hurt again and I like an orthotic adjustment again   Review of Systems     Objective:   Physical Exam  neurovascular status unchanged with patient having pain again in the sinus tarsi which has not been treated for a fairly long time. 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

## 2016-02-25 ENCOUNTER — Ambulatory Visit: Payer: Self-pay

## 2016-02-25 ENCOUNTER — Ambulatory Visit: Payer: BLUE CROSS/BLUE SHIELD | Admitting: Podiatry

## 2016-02-25 ENCOUNTER — Encounter: Payer: Self-pay | Admitting: Podiatry

## 2016-02-25 ENCOUNTER — Ambulatory Visit (INDEPENDENT_AMBULATORY_CARE_PROVIDER_SITE_OTHER): Payer: BLUE CROSS/BLUE SHIELD | Admitting: Podiatry

## 2016-02-25 VITALS — BP 108/72 | HR 75 | Resp 16

## 2016-02-25 DIAGNOSIS — M79671 Pain in right foot: Secondary | ICD-10-CM

## 2016-02-25 DIAGNOSIS — M779 Enthesopathy, unspecified: Secondary | ICD-10-CM | POA: Diagnosis not present

## 2016-02-25 MED ORDER — TRIAMCINOLONE ACETONIDE 10 MG/ML IJ SUSP
10.0000 mg | Freq: Once | INTRAMUSCULAR | Status: AC
  Administered 2016-02-25: 10 mg

## 2016-02-26 NOTE — Progress Notes (Signed)
Subjective:     Patient ID: Ruth Hopkins, female   DOB: 1966/05/28, 50 y.o.   MRN: [id number]  HPI patient presents with pain in the sinus tarsi of both feet that's very irritated   Review of Systems     Objective:   Physical Exam Neurovascular status intact with inflammation pain in the sinus tarsi of both feet    Assessment:     Inflammatory capsulitis bilateral    Plan:     Injected the sinus tarsi bilateral 3 mg Kenalog 5 mg Xylocaine and advised on physical therapy

## 2016-06-08 ENCOUNTER — Encounter: Payer: Self-pay | Admitting: Physician Assistant

## 2016-06-08 ENCOUNTER — Ambulatory Visit (INDEPENDENT_AMBULATORY_CARE_PROVIDER_SITE_OTHER): Payer: BLUE CROSS/BLUE SHIELD | Admitting: Physician Assistant

## 2016-06-08 VITALS — BP 95/69 | HR 83 | Temp 98.0°F | Ht 64.0 in | Wt 125.4 lb

## 2016-06-08 DIAGNOSIS — N3001 Acute cystitis with hematuria: Secondary | ICD-10-CM

## 2016-06-08 DIAGNOSIS — R350 Frequency of micturition: Secondary | ICD-10-CM | POA: Diagnosis not present

## 2016-06-08 LAB — MICROSCOPIC EXAMINATION: Epithelial Cells (non renal): >10 /hpf — AB (ref 0–10)

## 2016-06-08 LAB — URINALYSIS, COMPLETE
BILIRUBIN UA: NEGATIVE
GLUCOSE, UA: NEGATIVE
Ketones, UA: NEGATIVE
Leukocytes, UA: NEGATIVE
Nitrite, UA: NEGATIVE
PROTEIN UA: NEGATIVE
Specific Gravity, UA: 1.015 (ref 1.005–1.030)
UUROB: 0.2 mg/dL (ref 0.2–1.0)
pH, UA: 7.5 (ref 5.0–7.5)

## 2016-06-08 MED ORDER — CIPROFLOXACIN HCL 500 MG PO TABS: 500.0000 mg | ORAL_TABLET | Freq: Two times a day (BID) | ORAL | 0 refills | Status: DC

## 2016-06-08 NOTE — Patient Instructions (Signed)

## 2016-06-09 NOTE — Progress Notes (Signed)
BP 95/69   Pulse 83   Temp 98 F (36.7 C) (Oral)   Ht 5\' 4"  (1.626 m)   Wt 125 lb 6.4 oz (56.9 kg)   BMI 21.52 kg/m    Subjective:    Patient ID: Ruth Hopkins, female    DOB: Apr 23, 1966, 50 y.o.   MRN: [id number]  Ruth Hopkins is a 50 y.o. female presenting on 06/08/2016 for Nausea (x 3 weeks ); Urinary Frequency; and Urinary Retention (about a month ago. )   Urinary Frequency   Associated symptoms include frequency. Pertinent negatives include no flank pain or hematuria.  Patient here to be established as new patient at [facility].  This patient is known to me from [facility]. Her main complaint today is of urinary symptoms. She states that she has had frequency for many days now. She has also had some cloudiness to the urine. She has a history of UTIs in the past. She denies any obvious blood. There is burning when she goes and just afterwards. She denies any flank pain or abdominal pain other than at the suprapubic area. Her other medications and conditions are reviewed today. She is known to me from my previous practice. And she comes today getting established also. There are no refills needed at this time.   Relevant past medical, surgical, family and social history reviewed and updated as indicated. Interim medical history since our last visit reviewed. Allergies and medications reviewed and updated.   Data reviewed from any sources in EPIC.  Review of Systems  Constitutional: Negative.   HENT: Negative.   Eyes: Negative.   Respiratory: Negative.   Gastrointestinal: Negative.   Genitourinary: Positive for dysuria and frequency. Negative for flank pain and hematuria.    Per HPI unless specifically indicated above  Social History   Social History  . Marital status: Married    Spouse name: N/A  . Number of children: N/A  . Years of education: N/A   Occupational History  . Not on file.   Social History Main Topics  . Smoking  status: Light Tobacco Smoker  . Smokeless tobacco: Never Used  . Alcohol use No  . Drug use: No  . Sexual activity: Not on file   Other Topics Concern  . Not on file   Social History Narrative  . No narrative on file    Past Surgical History:  Procedure Laterality Date  . BREAST SURGERY Right 2007   lumpectomy  . FOOT SURGERY      Family History  Problem Relation Age of Onset  . Asthma Mother   . Hypothyroidism Mother   . Hypertension Mother   . Raynaud syndrome Mother   . Hypertension Father       Medication List       Accurate as of 06/08/16 11:59 PM. Always use your most recent med list.          buPROPion 300 MG 24 hr tablet Commonly known as:  WELLBUTRIN XL Take 300 mg by mouth.   ciprofloxacin 500 MG tablet Commonly known as:  CIPRO Take 1 tablet (500 mg total) by mouth 2 (two) times daily.   clonazePAM 0.5 MG tablet Commonly known as:  KLONOPIN Take 0.5 mg by mouth 2 (two) times daily.   diclofenac 75 MG EC tablet Commonly known as:  VOLTAREN Take 75 mg by mouth 2 (two) times daily.   eletriptan 40 MG tablet Commonly known as:  RELPAX Take 40  mg by mouth as needed for migraine. One tablet by mouth at onset of headache. May repeat in 2 hours if headache persists or recurs.   gabapentin 100 MG capsule Commonly known as:  NEURONTIN Take 200 mg by mouth daily.   imipramine 10 MG tablet Commonly known as:  TOFRANIL Take 30 mg by mouth at bedtime.   levothyroxine 25 MCG tablet Commonly known as:  SYNTHROID, LEVOTHROID Take 25 mcg by mouth daily before breakfast.   methocarbamol 750 MG tablet Commonly known as:  ROBAXIN Take 750 mg by mouth as needed.   MINIVELLE 0.1 MG/24HR patch Generic drug:  estradiol Place 1 patch onto the skin 2 (two) times a week.   omeprazole 20 MG capsule Commonly known as:  PRILOSEC Take 20 mg by mouth daily.   VOLTAREN 1 % Gel Generic drug:  diclofenac sodium          Objective:    BP 95/69   Pulse  83   Temp 98 F (36.7 C) (Oral)   Ht 5\' 4"  (1.626 m)   Wt 125 lb 6.4 oz (56.9 kg)   BMI 21.52 kg/m   Allergies  Allergen Reactions  . Latex Itching and Rash  . Codeine   . Sulfa Antibiotics    Wt Readings from Last 3 Encounters:  06/08/16 125 lb 6.4 oz (56.9 kg)  12/12/13 132 lb (59.9 kg)  12/05/13 130 lb (59 kg)    Physical Exam  Constitutional: She is oriented to person, place, and time. She appears well-developed and well-nourished.  HENT:  Head: Normocephalic and atraumatic.  Eyes: Conjunctivae are normal. Pupils are equal, round, and reactive to light.  Cardiovascular: Normal rate, regular rhythm, normal heart sounds and intact distal pulses.   Pulmonary/Chest: Effort normal and breath sounds normal.  Abdominal: Soft. Bowel sounds are normal. She exhibits no distension and no mass. There is tenderness in the suprapubic area. There is no rebound, no guarding and no CVA tenderness.  Neurological: She is alert and oriented to person, place, and time. She has normal reflexes.  Skin: Skin is warm and dry. No rash noted.  Psychiatric: She has a normal mood and affect. Her behavior is normal. Judgment and thought content normal.  Nursing note and vitals reviewed.   Results for orders placed or performed in visit on 06/08/16  Microscopic Examination  Result Value Ref Range   WBC, UA 0-5 0 - 5 /hpf   RBC, UA 0-2 0 - 2 /hpf   Epithelial Cells (non renal) >10 (A) 0 - 10 /hpf   Bacteria, UA Moderate (A) None seen/Few  Urinalysis, Complete  Result Value Ref Range   Specific Gravity, UA 1.015 1.005 - 1.030   pH, UA 7.5 5.0 - 7.5   Color, UA Yellow Yellow   Appearance Ur Clear Clear   Leukocytes, UA Negative Negative   Protein, UA Negative Negative/Trace   Glucose, UA Negative Negative   Ketones, UA Negative Negative   RBC, UA Trace (A) Negative   Bilirubin, UA Negative Negative   Urobilinogen, Ur 0.2 0.2 - 1.0 mg/dL   Nitrite, UA Negative Negative   Microscopic Examination  See below:       Assessment & Plan:   1. Frequent urination - Urinalysis, Complete - Urine culture - Microscopic Examination  2. Acute cystitis with hematuria - ciprofloxacin (CIPRO) 500 MG tablet; Take 1 tablet (500 mg total) by mouth 2 (two) times daily.  Dispense: 20 tablet; Refill: 0 - Microscopic Examination  Continue all other maintenance medications as listed above. Educational handout given for urinary tract infection  Follow up plan: Return in about 3 months (around 09/07/2016), or if symptoms worsen or fail to improve.  Terald Sleeper PA-C [facility] [address]  [place], [place] [address] [phone]   06/09/2016, 10:19 AM

## 2016-06-10 LAB — URINE CULTURE

## 2016-06-11 MED ORDER — CEFDINIR 300 MG PO CAPS: 300.0000 mg | ORAL_CAPSULE | Freq: Two times a day (BID) | ORAL | 0 refills | Status: DC

## 2016-06-11 NOTE — Addendum Note (Signed)
Addended by: Thana Ates on: 06/11/2016 08:47 AM   Modules accepted: Orders

## 2016-06-17 ENCOUNTER — Telehealth: Payer: Self-pay | Admitting: Physician Assistant

## 2016-06-18 MED ORDER — AMOXICILLIN-POT CLAVULANATE 875-125 MG PO TABS: 1.0000 | ORAL_TABLET | Freq: Two times a day (BID) | ORAL | 0 refills | Status: DC

## 2016-06-18 NOTE — Telephone Encounter (Signed)
macrobid was resistant but augmentin was not. Would recommend that.

## 2016-06-18 NOTE — Addendum Note (Signed)
Addended by: Wardell Heath on: 06/18/2016 04:27 PM   Modules accepted: Orders

## 2016-06-18 NOTE — Telephone Encounter (Signed)
Call in augmentin 875mg  1 tab po BID 7 days #14

## 2016-06-18 NOTE — Telephone Encounter (Signed)
Patient would like to try augmentin

## 2016-06-30 ENCOUNTER — Other Ambulatory Visit (INDEPENDENT_AMBULATORY_CARE_PROVIDER_SITE_OTHER): Payer: BLUE CROSS/BLUE SHIELD

## 2016-06-30 DIAGNOSIS — N3001 Acute cystitis with hematuria: Secondary | ICD-10-CM

## 2016-07-01 LAB — URINE CULTURE: Organism ID, Bacteria: NO GROWTH

## 2016-07-02 ENCOUNTER — Telehealth: Payer: Self-pay | Admitting: Physician Assistant

## 2016-07-02 DIAGNOSIS — R300 Dysuria: Secondary | ICD-10-CM

## 2016-07-02 NOTE — Telephone Encounter (Signed)
Urology consult for dysuria, decreased urine output

## 2016-07-02 NOTE — Telephone Encounter (Signed)
Advised patient that urine culture was negative. Patient states that she is still having symptoms and feels urgency but only urinates a small amount. Please advise on what she should do and route to [facility]

## 2016-07-05 NOTE — Telephone Encounter (Signed)
Pt aware referral will be placed to see Urologist in [place]

## 2016-08-17 ENCOUNTER — Other Ambulatory Visit: Payer: Self-pay

## 2016-08-17 MED ORDER — METHOCARBAMOL 750 MG PO TABS: ORAL_TABLET | ORAL | 0 refills | Status: DC

## 2016-08-19 ENCOUNTER — Telehealth: Payer: Self-pay | Admitting: Physician Assistant

## 2016-08-19 NOTE — Telephone Encounter (Signed)
Please advise if antibiotic will be sent in.

## 2016-08-20 ENCOUNTER — Ambulatory Visit (INDEPENDENT_AMBULATORY_CARE_PROVIDER_SITE_OTHER): Payer: BLUE CROSS/BLUE SHIELD | Admitting: Physician Assistant

## 2016-08-20 ENCOUNTER — Encounter: Payer: Self-pay | Admitting: Physician Assistant

## 2016-08-20 VITALS — BP 98/72 | HR 73 | Temp 98.3°F | Ht 64.0 in | Wt 128.0 lb

## 2016-08-20 DIAGNOSIS — R300 Dysuria: Secondary | ICD-10-CM | POA: Diagnosis not present

## 2016-08-20 DIAGNOSIS — M797 Fibromyalgia: Secondary | ICD-10-CM

## 2016-08-20 DIAGNOSIS — N3001 Acute cystitis with hematuria: Secondary | ICD-10-CM | POA: Diagnosis not present

## 2016-08-20 LAB — URINALYSIS, COMPLETE
BILIRUBIN UA: NEGATIVE
GLUCOSE, UA: NEGATIVE
KETONES UA: NEGATIVE
LEUKOCYTES UA: NEGATIVE
Nitrite, UA: NEGATIVE
PROTEIN UA: NEGATIVE
SPEC GRAV UA: 1.02 (ref 1.005–1.030)
Urobilinogen, Ur: 0.2 mg/dL (ref 0.2–1.0)
pH, UA: 5.5 (ref 5.0–7.5)

## 2016-08-20 LAB — MICROSCOPIC EXAMINATION: Renal Epithel, UA: NONE SEEN /hpf

## 2016-08-20 MED ORDER — HYDROCODONE-ACETAMINOPHEN 10-325 MG PO TABS: 1.0000 | ORAL_TABLET | Freq: Three times a day (TID) | ORAL | 0 refills | Status: DC | PRN

## 2016-08-20 MED ORDER — AMOXICILLIN-POT CLAVULANATE 875-125 MG PO TABS: 1.0000 | ORAL_TABLET | Freq: Two times a day (BID) | ORAL | 0 refills | Status: DC

## 2016-08-20 NOTE — Patient Instructions (Signed)
Acute Urinary Retention, Female Urinary retention means you are unable to pee completely or at all (empty your bladder). Follow these instructions at home:  Drink enough fluids to keep your pee (urine) clear or pale yellow.  If you are sent home with a tube that drains the bladder (catheter), there will be a drainage bag attached to it. There are two types of bags. One is big that you can wear at night without having to empty it. One is smaller and needs to be emptied more often.  Keep the drainage bag emptied.  Keep the drainage bag lower than the tube.  Only take medicine as told by your doctor. Contact a doctor if:  You have a low-grade fever.  You have spasms or you are leaking pee when you have spasms. Get help right away if:  You have chills or a fever.  Your catheter stops draining pee.  Your catheter falls out.  You have increased bleeding that does not stop after you have rested and increased the amount of fluids you had been drinking. This information is not intended to replace advice given to you by your health care provider. Make sure you discuss any questions you have with your health care provider. Document Released: 02/23/2008 Document Revised: 02/12/2016 Document Reviewed: 02/15/2013 Elsevier Interactive Patient Education  2017 Elsevier Inc.  

## 2016-08-20 NOTE — Telephone Encounter (Signed)
Patient has appointment today

## 2016-08-20 NOTE — Telephone Encounter (Signed)
Can she please come and give a urine sample to check for infection?

## 2016-08-21 LAB — URINE CULTURE: ORGANISM ID, BACTERIA: NO GROWTH

## 2016-08-23 ENCOUNTER — Other Ambulatory Visit: Payer: Self-pay | Admitting: Physician Assistant

## 2016-08-23 MED ORDER — GABAPENTIN 100 MG PO CAPS: 100.0000 mg | ORAL_CAPSULE | Freq: Three times a day (TID) | ORAL | 3 refills | Status: DC

## 2016-08-23 NOTE — Telephone Encounter (Signed)
Please advise 

## 2016-08-23 NOTE — Progress Notes (Signed)
BP 98/72   Pulse 73   Temp 98.3 F (36.8 C) (Oral)   Ht 5\' 4"  (1.626 m)   Wt 128 lb (58.1 kg)   BMI 21.97 kg/m    Subjective:    Patient ID: Ruth Hopkins, female    DOB: Nov 29, 1965, 50 y.o.   MRN: [id number]  HPI: Ruth Hopkins is a 50 y.o. female presenting on 08/20/2016 for Dysuria; Fatigue; and Nausea  Had pain, urine frequency for several days. Denies fever and chills. No NVD.  History of UTI.  Relevant past medical, surgical, family and social history reviewed and updated as indicated. Allergies and medications reviewed and updated.  Past Medical History:  Diagnosis Date  . Chronic fatigue   . Depression   . Fibromyalgia   . IBS (irritable bowel syndrome)   . Migraine   . Thyroid disease     Past Surgical History:  Procedure Laterality Date  . BREAST SURGERY Right 2007   lumpectomy  . FOOT SURGERY      Review of Systems  Constitutional: Negative.   HENT: Negative.   Eyes: Negative.   Respiratory: Negative.   Gastrointestinal: Negative.   Genitourinary: Positive for difficulty urinating, dysuria and urgency. Negative for flank pain and pelvic pain.      Medication List       Accurate as of 08/20/16 11:59 PM. Always use your most recent med list.          amoxicillin-clavulanate 875-125 MG tablet Commonly known as:  AUGMENTIN Take 1 tablet by mouth 2 (two) times daily.   buPROPion 300 MG 24 hr tablet Commonly known as:  WELLBUTRIN XL Take 300 mg by mouth.   clonazePAM 0.5 MG tablet Commonly known as:  KLONOPIN Take 0.5 mg by mouth 2 (two) times daily.   diclofenac 75 MG EC tablet Commonly known as:  VOLTAREN Take 75 mg by mouth 2 (two) times daily.   eletriptan 40 MG tablet Commonly known as:  RELPAX Take 40 mg by mouth as needed for migraine. One tablet by mouth at onset of headache. May repeat in 2 hours if headache persists or recurs.   gabapentin 100 MG capsule Commonly known as:  NEURONTIN Take 200 mg by mouth daily.     HYDROcodone-acetaminophen 10-325 MG tablet Commonly known as:  NORCO Take 1 tablet by mouth every 8 (eight) hours as needed.   imipramine 10 MG tablet Commonly known as:  TOFRANIL Take 30 mg by mouth at bedtime.   levothyroxine 25 MCG tablet Commonly known as:  SYNTHROID, LEVOTHROID Take 25 mcg by mouth daily before breakfast.   methocarbamol 750 MG tablet Commonly known as:  ROBAXIN Take 1/2 to 2 tablets up to 4 times daily   MINIVELLE 0.1 MG/24HR patch Generic drug:  estradiol Place 1 patch onto the skin 2 (two) times a week.   omeprazole 20 MG capsule Commonly known as:  PRILOSEC Take 20 mg by mouth daily.   VOLTAREN 1 % Gel Generic drug:  diclofenac sodium          Objective:    BP 98/72   Pulse 73   Temp 98.3 F (36.8 C) (Oral)   Ht 5\' 4"  (1.626 m)   Wt 128 lb (58.1 kg)   BMI 21.97 kg/m   Allergies  Allergen Reactions  . Latex Itching and Rash  . Codeine   . Sulfa Antibiotics     Physical Exam  Constitutional: She is oriented to person, place, and time. She  appears well-developed and well-nourished.  HENT:  Head: Normocephalic and atraumatic.  Eyes: Conjunctivae are normal. Pupils are equal, round, and reactive to light.  Cardiovascular: Normal rate, regular rhythm, normal heart sounds and intact distal pulses.   Pulmonary/Chest: Effort normal and breath sounds normal.  Abdominal: Soft. Bowel sounds are normal. She exhibits no distension and no mass. There is tenderness in the suprapubic area. There is no rebound, no guarding and no CVA tenderness.  Neurological: She is alert and oriented to person, place, and time. She has normal reflexes.  Skin: Skin is warm and dry. No rash noted.  Psychiatric: She has a normal mood and affect. Her behavior is normal. Judgment and thought content normal.    Results for orders placed or performed in visit on 08/20/16  Urine culture  Result Value Ref Range   Urine Culture, Routine Final report    Urine Culture  result 1 No growth   Microscopic Examination  Result Value Ref Range   WBC, UA 0-5 0 - 5 /hpf   RBC, UA 3-10 (A) 0 - 2 /hpf   Epithelial Cells (non renal) 0-10 (A) 0 - 10 /hpf   Renal Epithel, UA None seen None seen /hpf   Bacteria, UA Few None seen/Few  Urinalysis, Complete  Result Value Ref Range   Specific Gravity, UA 1.020 1.005 - 1.030   pH, UA 5.5 5.0 - 7.5   Color, UA Yellow Yellow   Appearance Ur Clear Clear   Leukocytes, UA Negative Negative   Protein, UA Negative Negative/Trace   Glucose, UA Negative Negative   Ketones, UA Negative Negative   RBC, UA 2+ (A) Negative   Bilirubin, UA Negative Negative   Urobilinogen, Ur 0.2 0.2 - 1.0 mg/dL   Nitrite, UA Negative Negative   Microscopic Examination See below:       Assessment & Plan:   1. Dysuria - Urinalysis, Complete - Urine culture - Microscopic Examination  2. Fibromyalgia - HYDROcodone-acetaminophen (NORCO) 10-325 MG tablet; Take 1 tablet by mouth every 8 (eight) hours as needed.  Dispense: 90 tablet; Refill: 0  3. Acute cystitis with hematuria - amoxicillin-clavulanate (AUGMENTIN) 875-125 MG tablet; Take 1 tablet by mouth 2 (two) times daily.  Dispense: 14 tablet; Refill: 0 - Microscopic Examination   Continue all other maintenance medications as listed above.  Follow up plan: No Follow-up on file.  Orders Placed This Encounter  Procedures  . Urine culture  . Microscopic Examination  . Urinalysis, Complete    Educational handout given for UTI  Terald Sleeper PA-C [facility] [address]  [place], [place] [address] [phone]   08/23/2016, 10:35 PM

## 2016-08-23 NOTE — Telephone Encounter (Signed)
sent 

## 2016-08-24 NOTE — Telephone Encounter (Signed)
Detailed message left for patient that rx for gabapentin sent to pharmacy.

## 2016-08-29 ENCOUNTER — Other Ambulatory Visit: Payer: Self-pay | Admitting: Physician Assistant

## 2016-08-29 DIAGNOSIS — G43109 Migraine with aura, not intractable, without status migrainosus: Secondary | ICD-10-CM | POA: Insufficient documentation

## 2016-08-30 DIAGNOSIS — G43109 Migraine with aura, not intractable, without status migrainosus: Secondary | ICD-10-CM | POA: Insufficient documentation

## 2016-09-17 ENCOUNTER — Ambulatory Visit: Payer: BLUE CROSS/BLUE SHIELD | Admitting: Podiatry

## 2016-09-19 ENCOUNTER — Other Ambulatory Visit: Payer: Self-pay | Admitting: Physician Assistant

## 2016-09-30 DIAGNOSIS — Z9189 Other specified personal risk factors, not elsewhere classified: Secondary | ICD-10-CM | POA: Insufficient documentation

## 2016-10-18 ENCOUNTER — Other Ambulatory Visit: Payer: Self-pay | Admitting: *Deleted

## 2016-10-18 DIAGNOSIS — N3001 Acute cystitis with hematuria: Secondary | ICD-10-CM

## 2016-10-18 MED ORDER — AMOXICILLIN-POT CLAVULANATE 875-125 MG PO TABS: 1.0000 | ORAL_TABLET | Freq: Two times a day (BID) | ORAL | 0 refills | Status: DC

## 2016-10-20 ENCOUNTER — Telehealth: Payer: Self-pay | Admitting: Physician Assistant

## 2016-10-20 ENCOUNTER — Other Ambulatory Visit: Payer: Self-pay

## 2016-10-20 DIAGNOSIS — N3001 Acute cystitis with hematuria: Secondary | ICD-10-CM

## 2016-10-20 MED ORDER — AMOXICILLIN 500 MG PO CAPS: 1000.0000 mg | ORAL_CAPSULE | Freq: Two times a day (BID) | ORAL | 0 refills | Status: DC

## 2016-10-20 MED ORDER — AMOXICILLIN-POT CLAVULANATE 875-125 MG PO TABS: 1.0000 | ORAL_TABLET | Freq: Two times a day (BID) | ORAL | 0 refills | Status: DC

## 2016-10-20 NOTE — Telephone Encounter (Signed)
Prescription sent to pharmacy.

## 2016-10-21 NOTE — Telephone Encounter (Signed)
Patient aware, script is ready. 

## 2016-11-22 ENCOUNTER — Telehealth: Payer: Self-pay | Admitting: Physician Assistant

## 2016-11-22 MED ORDER — OMEPRAZOLE 20 MG PO CPDR: 20.0000 mg | DELAYED_RELEASE_CAPSULE | Freq: Every day | ORAL | 3 refills | Status: DC

## 2016-11-22 NOTE — Telephone Encounter (Signed)
Left detailed message regarding RX 

## 2016-11-24 ENCOUNTER — Encounter: Payer: Self-pay | Admitting: Podiatry

## 2016-11-24 ENCOUNTER — Ambulatory Visit (INDEPENDENT_AMBULATORY_CARE_PROVIDER_SITE_OTHER): Payer: Medicare HMO

## 2016-11-24 ENCOUNTER — Ambulatory Visit (INDEPENDENT_AMBULATORY_CARE_PROVIDER_SITE_OTHER): Payer: Medicare HMO | Admitting: Podiatry

## 2016-11-24 VITALS — BP 107/66 | HR 76 | Resp 16

## 2016-11-24 DIAGNOSIS — M79671 Pain in right foot: Secondary | ICD-10-CM

## 2016-11-24 DIAGNOSIS — M79672 Pain in left foot: Secondary | ICD-10-CM | POA: Diagnosis not present

## 2016-11-24 DIAGNOSIS — M7751 Other enthesopathy of right foot: Secondary | ICD-10-CM

## 2016-11-24 DIAGNOSIS — M7752 Other enthesopathy of left foot: Secondary | ICD-10-CM

## 2016-11-24 DIAGNOSIS — M779 Enthesopathy, unspecified: Secondary | ICD-10-CM

## 2016-11-24 DIAGNOSIS — M778 Other enthesopathies, not elsewhere classified: Secondary | ICD-10-CM

## 2016-11-24 MED ORDER — TRIAMCINOLONE ACETONIDE 10 MG/ML IJ SUSP
10.0000 mg | Freq: Once | INTRAMUSCULAR | Status: AC
  Administered 2016-11-24: 10 mg

## 2016-11-27 NOTE — Progress Notes (Signed)
Subjective:     Patient ID: Ruth Hopkins, female   DOB: Jun 18, 1966, 51 y.o.   MRN: [id number]  HPI patient states she's been doing pretty well but her ankles have flared recently right over left and also she needs new orthotics   Review of Systems     Objective:   Physical Exam   Assessment:     Pain in the sinus tarsi bilateral with inflammation fluid buildup right over left with patient noted to have sinus tarsitis and depression of arch     Plan:     Reviewed condition and at this time I injected the sinus tarsi bilateral 3 mg Kenalog 5 mg Xylocaine and I scanned for new custom orthotics to reduce plantar stress

## 2016-11-29 ENCOUNTER — Ambulatory Visit: Payer: BLUE CROSS/BLUE SHIELD | Admitting: Podiatry

## 2016-12-01 ENCOUNTER — Telehealth: Payer: Self-pay | Admitting: Physician Assistant

## 2016-12-01 MED ORDER — DICLOFENAC SODIUM 75 MG PO TBEC: 75.0000 mg | DELAYED_RELEASE_TABLET | Freq: Two times a day (BID) | ORAL | 3 refills | Status: DC

## 2016-12-01 NOTE — Telephone Encounter (Signed)
LM- rx requested sent to pharmacy.

## 2016-12-01 NOTE — Telephone Encounter (Signed)
What is the name of the medication? Diclofenac sodium 75mg   Have you contacted your pharmacy to request a refill? no  Which pharmacy would you like this sent to? Mail order and fax # is [phone] / [phone] .Marland Kitchen Need a 90 day with refills   Patient notified that their request is being sent to the clinical staff for review and that they should receive a call once it is complete. If they do not receive a call within 24 hours they can check with their pharmacy or our office.

## 2016-12-09 ENCOUNTER — Telehealth: Payer: Self-pay | Admitting: Physician Assistant

## 2016-12-09 MED ORDER — MOMETASONE FUROATE 50 MCG/ACT NA SUSP: 2.0000 | Freq: Every day | NASAL | 12 refills | Status: DC

## 2016-12-09 NOTE — Telephone Encounter (Signed)
Patient aware rx sent pharmacy.

## 2016-12-09 NOTE — Telephone Encounter (Signed)
What is the name of the medication? Mometasone furoate monohydrate nasal spray  Have you contacted your pharmacy to request a refill? no  Which pharmacy would you like this sent to? [facility] [place]   Patient notified that their request is being sent to the clinical staff for review and that they should receive a call once it is complete. If they do not receive a call within 24 hours they can check with their pharmacy or our office.

## 2016-12-10 ENCOUNTER — Encounter: Payer: Self-pay | Admitting: Physician Assistant

## 2016-12-10 ENCOUNTER — Ambulatory Visit (INDEPENDENT_AMBULATORY_CARE_PROVIDER_SITE_OTHER): Payer: Medicare HMO | Admitting: Physician Assistant

## 2016-12-10 VITALS — BP 102/69 | HR 77 | Temp 97.5°F | Ht 64.0 in | Wt 130.0 lb

## 2016-12-10 DIAGNOSIS — R635 Abnormal weight gain: Secondary | ICD-10-CM

## 2016-12-10 DIAGNOSIS — M545 Low back pain, unspecified: Secondary | ICD-10-CM

## 2016-12-10 DIAGNOSIS — Z0000 Encounter for general adult medical examination without abnormal findings: Secondary | ICD-10-CM | POA: Diagnosis not present

## 2016-12-10 DIAGNOSIS — E039 Hypothyroidism, unspecified: Secondary | ICD-10-CM | POA: Diagnosis not present

## 2016-12-10 DIAGNOSIS — L659 Nonscarring hair loss, unspecified: Secondary | ICD-10-CM | POA: Diagnosis not present

## 2016-12-10 DIAGNOSIS — R5383 Other fatigue: Secondary | ICD-10-CM | POA: Diagnosis not present

## 2016-12-10 MED ORDER — PREDNISONE 10 MG (21) PO TBPK: ORAL_TABLET | ORAL | 0 refills | Status: DC

## 2016-12-10 NOTE — Patient Instructions (Signed)
Hypothyroidism Hypothyroidism is a disorder of the thyroid. The thyroid is a large gland that is located in the lower front of the neck. The thyroid releases hormones that control how the body works. With hypothyroidism, the thyroid does not make enough of these hormones. What are the causes? Causes of hypothyroidism may include:  Viral infections.  Pregnancy.  Your own defense system (immune system) attacking your thyroid.  Certain medicines.  Birth defects.  Past radiation treatments to your head or neck.  Past treatment with radioactive iodine.  Past surgical removal of part or all of your thyroid.  Problems with the gland that is located in the center of your brain (pituitary).  What are the signs or symptoms? Signs and symptoms of hypothyroidism may include:  Feeling as though you have no energy (lethargy).  Inability to tolerate cold.  Weight gain that is not explained by a change in diet or exercise habits.  Dry skin.  Coarse hair.  Menstrual irregularity.  Slowing of thought processes.  Constipation.  Sadness or depression.  How is this diagnosed? Your health care provider may diagnose hypothyroidism with blood tests and ultrasound tests. How is this treated? Hypothyroidism is treated with medicine that replaces the hormones that your body does not make. After you begin treatment, it may take several weeks for symptoms to go away. Follow these instructions at home:  Take medicines only as directed by your health care provider.  If you start taking any new medicines, tell your health care provider.  Keep all follow-up visits as directed by your health care provider. This is important. As your condition improves, your dosage needs may change. You will need to have blood tests regularly so that your health care provider can watch your condition. Contact a health care provider if:  Your symptoms do not get better with treatment.  You are taking thyroid  replacement medicine and: ? You sweat excessively. ? You have tremors. ? You feel anxious. ? You lose weight rapidly. ? You cannot tolerate heat. ? You have emotional swings. ? You have diarrhea. ? You feel weak. Get help right away if:  You develop chest pain.  You develop an irregular heartbeat.  You develop a rapid heartbeat. This information is not intended to replace advice given to you by your health care provider. Make sure you discuss any questions you have with your health care provider. Document Released: 09/06/2005 Document Revised: 02/12/2016 Document Reviewed: 01/22/2014 Elsevier Interactive Patient Education  2017 Elsevier Inc.  

## 2016-12-10 NOTE — Progress Notes (Signed)
BP 102/69   Pulse 77   Temp 97.5 F (36.4 C) (Oral)   Ht '5\' 4"'$  (1.626 m)   Wt 130 lb (59 kg)   BMI 22.31 kg/m    Subjective:    Patient ID: Ruth Hopkins, female    DOB: Dec 23, 1965, 51 y.o.   MRN: [id number]  HPI: Ruth Hopkins is a 51 y.o. female presenting on 12/10/2016 for thyroid check; Fatigue; Alopecia; and Back Pain  This patient comes in for periodic recheck on medications and conditions including thyroid. Also pulled some muscles in her lumbar area. Was doing some exercise and pulled the area. Has muscle relaxant at home but had not tried it yet. Prednisone has helped in the past..   All medications are reviewed today. There are no reports of any problems with the medications. All of the medical conditions are reviewed and updated.  Lab work is reviewed and will be ordered as medically necessary. There are no new problems reported with today's visit.   Relevant past medical, surgical, family and social history reviewed and updated as indicated. Allergies and medications reviewed and updated.  Past Medical History:  Diagnosis Date  . Chronic fatigue   . Depression   . Fibromyalgia   . IBS (irritable bowel syndrome)   . Migraine   . Thyroid disease     Past Surgical History:  Procedure Laterality Date  . BREAST SURGERY Right 2007   lumpectomy  . FOOT SURGERY      Review of Systems  Constitutional: Positive for fatigue and unexpected weight change. Negative for activity change and fever.  HENT: Negative.   Eyes: Negative.   Respiratory: Negative.  Negative for cough.   Cardiovascular: Negative.  Negative for chest pain.  Gastrointestinal: Negative.  Negative for abdominal pain.  Endocrine: Negative.   Genitourinary: Negative.  Negative for dysuria.  Musculoskeletal: Positive for back pain. Negative for gait problem.  Skin: Negative.   Neurological: Negative.     Allergies as of 12/10/2016      Reactions   Latex Itching, Rash   Codeine    Sulfa Antibiotics        Medication List       Accurate as of 12/10/16  2:13 PM. Always use your most recent med list.          buPROPion 300 MG 24 hr tablet Commonly known as:  WELLBUTRIN XL Take 300 mg by mouth.   clonazePAM 0.5 MG tablet Commonly known as:  KLONOPIN Take 0.5 mg by mouth 2 (two) times daily.   diclofenac 75 MG EC tablet Commonly known as:  VOLTAREN Take 1 tablet (75 mg total) by mouth 2 (two) times daily.   eletriptan 40 MG tablet Commonly known as:  RELPAX Take 40 mg by mouth as needed for migraine. One tablet by mouth at onset of headache. May repeat in 2 hours if headache persists or recurs.   gabapentin 100 MG capsule Commonly known as:  NEURONTIN Take 1 capsule (100 mg total) by mouth 3 (three) times daily.   HYDROcodone-acetaminophen 10-325 MG tablet Commonly known as:  NORCO Take 1 tablet by mouth every 8 (eight) hours as needed.   imipramine 10 MG tablet Commonly known as:  TOFRANIL Take 30 mg by mouth at bedtime.   levothyroxine 25 MCG tablet Commonly known as:  SYNTHROID, LEVOTHROID Take 25 mcg by mouth daily before breakfast.   methocarbamol 750 MG tablet Commonly known as:  ROBAXIN TAKE 1/2 TO 2 TABLETS UP  TO 4 TIMES DAILY   MINIVELLE 0.1 MG/24HR patch Generic drug:  estradiol Place 1 patch onto the skin 2 (two) times a week.   mometasone 50 MCG/ACT nasal spray Commonly known as:  NASONEX Place 2 sprays into the nose daily.   omeprazole 20 MG capsule Commonly known as:  PRILOSEC Take 1 capsule (20 mg total) by mouth daily.   predniSONE 10 MG (21) Tbpk tablet Commonly known as:  STERAPRED UNI-PAK 21 TAB As directed x 6 days          Objective:    BP 102/69   Pulse 77   Temp 97.5 F (36.4 C) (Oral)   Ht '5\' 4"'$  (1.626 m)   Wt 130 lb (59 kg)   BMI 22.31 kg/m   Allergies  Allergen Reactions  . Latex Itching and Rash  . Codeine   . Sulfa Antibiotics     Physical Exam  Constitutional: She is oriented to person, place, and time.  She appears well-developed and well-nourished.  HENT:  Head: Normocephalic and atraumatic.  Eyes: Conjunctivae and EOM are normal. Pupils are equal, round, and reactive to light.  Cardiovascular: Normal rate, regular rhythm, normal heart sounds and intact distal pulses.   Pulmonary/Chest: Effort normal and breath sounds normal.  Abdominal: Soft. Bowel sounds are normal.  Musculoskeletal:       Lumbar back: She exhibits tenderness. She exhibits normal range of motion.       Back:  Neurological: She is alert and oriented to person, place, and time. She has normal reflexes.  Skin: Skin is warm and dry. No rash noted.  Psychiatric: She has a normal mood and affect. Her behavior is normal. Judgment and thought content normal.        Assessment & Plan:   1. Fatigue, unspecified type - TSH - CMP14+EGFR  2. Hair loss - TSH - CMP14+EGFR  3. Weight gain - TSH - CMP14+EGFR  4. Acute midline low back pain without sciatica - predniSONE (STERAPRED UNI-PAK 21 TAB) 10 MG (21) TBPK tablet; As directed x 6 days  Dispense: 21 tablet; Refill: 0  5. Well adult exam - TSH - CBC with Differential - CMP14+EGFR - Lipid panel  6. Acquired hypothyroidism   Continue all other maintenance medications as listed above.  Follow up plan: No Follow-up on file.  Educational handout given for hypothyroidism  Terald Sleeper PA-C [facility] [address]  [place], [place] [address] [phone]   12/10/2016, 2:13 PM

## 2016-12-11 LAB — CBC WITH DIFFERENTIAL/PLATELET
Basophils Absolute: 0.1 10*3/uL (ref 0.0–0.2)
Basos: 1 %
EOS (ABSOLUTE): 0.2 10*3/uL (ref 0.0–0.4)
EOS: 3 %
HEMATOCRIT: 39.5 % (ref 34.0–46.6)
HEMOGLOBIN: 12.6 g/dL (ref 11.1–15.9)
IMMATURE GRANS (ABS): 0 10*3/uL (ref 0.0–0.1)
Immature Granulocytes: 0 %
LYMPHS: 40 %
Lymphocytes Absolute: 2.1 10*3/uL (ref 0.7–3.1)
MCH: 28.6 pg (ref 26.6–33.0)
MCHC: 31.9 g/dL (ref 31.5–35.7)
MCV: 90 fL (ref 79–97)
MONOCYTES: 9 %
Monocytes Absolute: 0.5 10*3/uL (ref 0.1–0.9)
NEUTROS ABS: 2.4 10*3/uL (ref 1.4–7.0)
Neutrophils: 47 %
Platelets: 176 10*3/uL (ref 150–379)
RBC: 4.41 x10E6/uL (ref 3.77–5.28)
RDW: 12.8 % (ref 12.3–15.4)
WBC: 5.1 10*3/uL (ref 3.4–10.8)

## 2016-12-11 LAB — CMP14+EGFR
ALBUMIN: 3.9 g/dL (ref 3.5–5.5)
ALT: 20 IU/L (ref 0–32)
AST: 17 IU/L (ref 0–40)
Albumin/Globulin Ratio: 1.5 (ref 1.2–2.2)
Alkaline Phosphatase: 48 IU/L (ref 39–117)
BUN / CREAT RATIO: 30 — AB (ref 9–23)
BUN: 24 mg/dL (ref 6–24)
Bilirubin Total: 0.2 mg/dL (ref 0.0–1.2)
CO2: 25 mmol/L (ref 18–29)
CREATININE: 0.79 mg/dL (ref 0.57–1.00)
Calcium: 9 mg/dL (ref 8.7–10.2)
Chloride: 100 mmol/L (ref 96–106)
GFR, EST AFRICAN AMERICAN: 100 mL/min/{1.73_m2} (ref >59)
GFR, EST NON AFRICAN AMERICAN: 87 mL/min/{1.73_m2} (ref >59)
Globulin, Total: 2.6 g/dL (ref 1.5–4.5)
Glucose: 89 mg/dL (ref 65–99)
Potassium: 3.9 mmol/L (ref 3.5–5.2)
Sodium: 139 mmol/L (ref 134–144)
TOTAL PROTEIN: 6.5 g/dL (ref 6.0–8.5)

## 2016-12-11 LAB — LIPID PANEL
CHOL/HDL RATIO: 3.2 ratio (ref 0.0–4.4)
Cholesterol, Total: 182 mg/dL (ref 100–199)
HDL: 57 mg/dL (ref >39)
LDL CALC: 99 mg/dL (ref 0–99)
Triglycerides: 130 mg/dL (ref 0–149)
VLDL CHOLESTEROL CAL: 26 mg/dL (ref 5–40)

## 2016-12-11 LAB — TSH: TSH: 4.64 u[IU]/mL — AB (ref 0.450–4.500)

## 2016-12-13 ENCOUNTER — Other Ambulatory Visit: Payer: Self-pay | Admitting: Physician Assistant

## 2016-12-13 MED ORDER — LEVOTHYROXINE SODIUM 50 MCG PO TABS: 50.0000 ug | ORAL_TABLET | Freq: Every day | ORAL | 0 refills | Status: DC

## 2016-12-15 ENCOUNTER — Telehealth: Payer: Self-pay | Admitting: Physician Assistant

## 2016-12-20 ENCOUNTER — Other Ambulatory Visit: Payer: Medicare HMO

## 2016-12-22 ENCOUNTER — Other Ambulatory Visit: Payer: Medicare HMO

## 2016-12-30 ENCOUNTER — Other Ambulatory Visit: Payer: Medicare HMO

## 2017-01-07 ENCOUNTER — Other Ambulatory Visit: Payer: Self-pay | Admitting: Physician Assistant

## 2017-01-07 DIAGNOSIS — N3001 Acute cystitis with hematuria: Secondary | ICD-10-CM

## 2017-01-19 DIAGNOSIS — R69 Illness, unspecified: Secondary | ICD-10-CM | POA: Diagnosis not present

## 2017-01-19 DIAGNOSIS — F411 Generalized anxiety disorder: Secondary | ICD-10-CM | POA: Diagnosis not present

## 2017-03-07 DIAGNOSIS — D0501 Lobular carcinoma in situ of right breast: Secondary | ICD-10-CM | POA: Diagnosis not present

## 2017-03-07 DIAGNOSIS — R922 Inconclusive mammogram: Secondary | ICD-10-CM | POA: Diagnosis not present

## 2017-03-08 ENCOUNTER — Ambulatory Visit: Payer: Medicare HMO | Admitting: Physician Assistant

## 2017-03-09 ENCOUNTER — Ambulatory Visit (INDEPENDENT_AMBULATORY_CARE_PROVIDER_SITE_OTHER): Payer: Medicare HMO | Admitting: Physician Assistant

## 2017-03-09 ENCOUNTER — Encounter: Payer: Self-pay | Admitting: Physician Assistant

## 2017-03-09 ENCOUNTER — Other Ambulatory Visit: Payer: Self-pay | Admitting: Physician Assistant

## 2017-03-09 VITALS — BP 105/68 | HR 78 | Temp 97.8°F | Ht 64.0 in | Wt 129.6 lb

## 2017-03-09 DIAGNOSIS — Z720 Tobacco use: Secondary | ICD-10-CM

## 2017-03-09 DIAGNOSIS — M25561 Pain in right knee: Secondary | ICD-10-CM | POA: Diagnosis not present

## 2017-03-09 DIAGNOSIS — Z716 Tobacco abuse counseling: Secondary | ICD-10-CM | POA: Insufficient documentation

## 2017-03-09 DIAGNOSIS — B353 Tinea pedis: Secondary | ICD-10-CM | POA: Diagnosis not present

## 2017-03-09 DIAGNOSIS — M797 Fibromyalgia: Secondary | ICD-10-CM | POA: Diagnosis not present

## 2017-03-09 DIAGNOSIS — E039 Hypothyroidism, unspecified: Secondary | ICD-10-CM

## 2017-03-09 MED ORDER — CLOTRIMAZOLE-BETAMETHASONE 1-0.05 % EX CREA: 1.0000 "application " | TOPICAL_CREAM | Freq: Two times a day (BID) | CUTANEOUS | 5 refills | Status: DC

## 2017-03-09 MED ORDER — NICOTINE 7 MG/24HR TD PT24: 7.0000 mg | MEDICATED_PATCH | Freq: Every day | TRANSDERMAL | 0 refills | Status: DC

## 2017-03-09 MED ORDER — GABAPENTIN 100 MG PO CAPS
100.0000 mg | ORAL_CAPSULE | Freq: Three times a day (TID) | ORAL | 3 refills | Status: DC
Start: 2017-03-09 — End: 2018-02-20

## 2017-03-09 NOTE — Patient Instructions (Signed)
Hypothyroidism Hypothyroidism is a disorder of the thyroid. The thyroid is a large gland that is located in the lower front of the neck. The thyroid releases hormones that control how the body works. With hypothyroidism, the thyroid does not make enough of these hormones. What are the causes? Causes of hypothyroidism may include:  Viral infections.  Pregnancy.  Your own defense system (immune system) attacking your thyroid.  Certain medicines.  Birth defects.  Past radiation treatments to your head or neck.  Past treatment with radioactive iodine.  Past surgical removal of part or all of your thyroid.  Problems with the gland that is located in the center of your brain (pituitary).  What are the signs or symptoms? Signs and symptoms of hypothyroidism may include:  Feeling as though you have no energy (lethargy).  Inability to tolerate cold.  Weight gain that is not explained by a change in diet or exercise habits.  Dry skin.  Coarse hair.  Menstrual irregularity.  Slowing of thought processes.  Constipation.  Sadness or depression.  How is this diagnosed? Your health care provider may diagnose hypothyroidism with blood tests and ultrasound tests. How is this treated? Hypothyroidism is treated with medicine that replaces the hormones that your body does not make. After you begin treatment, it may take several weeks for symptoms to go away. Follow these instructions at home:  Take medicines only as directed by your health care provider.  If you start taking any new medicines, tell your health care provider.  Keep all follow-up visits as directed by your health care provider. This is important. As your condition improves, your dosage needs may change. You will need to have blood tests regularly so that your health care provider can watch your condition. Contact a health care provider if:  Your symptoms do not get better with treatment.  You are taking thyroid  replacement medicine and: ? You sweat excessively. ? You have tremors. ? You feel anxious. ? You lose weight rapidly. ? You cannot tolerate heat. ? You have emotional swings. ? You have diarrhea. ? You feel weak. Get help right away if:  You develop chest pain.  You develop an irregular heartbeat.  You develop a rapid heartbeat. This information is not intended to replace advice given to you by your health care provider. Make sure you discuss any questions you have with your health care provider. Document Released: 09/06/2005 Document Revised: 02/12/2016 Document Reviewed: 01/22/2014 Elsevier Interactive Patient Education  2017 Elsevier Inc.  

## 2017-03-10 LAB — TSH: TSH: 2.17 u[IU]/mL (ref 0.450–4.500)

## 2017-03-12 NOTE — Progress Notes (Signed)
BP 105/68   Pulse 78   Temp 97.8 F (36.6 C) (Oral)   Ht 5\' 4"  (1.626 m)   Wt 129 lb 9.6 oz (58.8 kg)   BMI 22.25 kg/m    Subjective:    Patient ID: Ruth Hopkins, female    DOB: Apr 14, 1966, 51 y.o.   MRN: [id number]  HPI: Ruth Hopkins is a 51 y.o. female presenting on 03/09/2017 for Medication Refill; Hypothyroidism; and Nicotine Dependence  This patient comes in for periodic recheck on medications and conditions including Hypothyroidism, fibromyalgia, chronic knee pain. She also wants to discuss smoking cessation. She is down to less than half a pack a day per day. We've had a discussion about nicotine replacement and how passages can help continue to help her decrease hand mouth habit. She also is having some peeling on the sides of her feet with associated itching. All of her medications are doing well. We're due lab work also.  All medications are reviewed today. There are no reports of any problems with the medications. All of the medical conditions are reviewed and updated.  Lab work is reviewed and will be ordered as medically necessary. There are no new problems reported with today's visit.   Relevant past medical, surgical, family and social history reviewed and updated as indicated. Allergies and medications reviewed and updated.  Past Medical History:  Diagnosis Date  . Chronic fatigue   . Depression   . Fibromyalgia   . IBS (irritable bowel syndrome)   . Migraine   . Thyroid disease     Past Surgical History:  Procedure Laterality Date  . BREAST SURGERY Right 2007   lumpectomy  . FOOT SURGERY      Review of Systems  Constitutional: Negative.  Negative for activity change, fatigue and fever.  HENT: Negative.   Eyes: Negative.   Respiratory: Negative.  Negative for cough.   Cardiovascular: Negative.  Negative for chest pain.  Gastrointestinal: Negative.  Negative for abdominal pain.  Endocrine: Negative.   Genitourinary: Negative.  Negative for dysuria.    Musculoskeletal: Negative.   Skin: Positive for rash.  Neurological: Negative.     Allergies as of 03/09/2017      Reactions   Latex Itching, Rash   Codeine    Sulfa Antibiotics       Medication List       Accurate as of 03/09/17 11:59 PM. Always use your most recent med list.          buPROPion 300 MG 24 hr tablet Commonly known as:  WELLBUTRIN XL Take 300 mg by mouth.   clonazePAM 0.5 MG tablet Commonly known as:  KLONOPIN Take 0.5 mg by mouth 2 (two) times daily.   clotrimazole-betamethasone cream Commonly known as:  LOTRISONE Apply 1 application topically 2 (two) times daily.   diclofenac 75 MG EC tablet Commonly known as:  VOLTAREN Take 1 tablet (75 mg total) by mouth 2 (two) times daily.   eletriptan 40 MG tablet Commonly known as:  RELPAX Take 40 mg by mouth as needed for migraine. One tablet by mouth at onset of headache. May repeat in 2 hours if headache persists or recurs.   gabapentin 100 MG capsule Commonly known as:  NEURONTIN Take 1 capsule (100 mg total) by mouth 3 (three) times daily.   HYDROcodone-acetaminophen 10-325 MG tablet Commonly known as:  NORCO Take 1 tablet by mouth every 8 (eight) hours as needed.   imipramine 10 MG tablet Commonly known as:  TOFRANIL Take 30 mg by mouth at bedtime.   levothyroxine 50 MCG tablet Commonly known as:  SYNTHROID, LEVOTHROID TAKE 1 TABLET BY MOUTH ONCE DAILY BEFORE BREAKFAST   methocarbamol 750 MG tablet Commonly known as:  ROBAXIN TAKE 1/2 TO 2 TABLETS UP TO 4 TIMES DAILY   MINIVELLE 0.1 MG/24HR patch Generic drug:  estradiol Place 1 patch onto the skin 2 (two) times a week.   nicotine 7 mg/24hr patch Commonly known as:  NICODERM CQ - dosed in mg/24 hr Place 1 patch (7 mg total) onto the skin daily.   omeprazole 20 MG capsule Commonly known as:  PRILOSEC Take 1 capsule (20 mg total) by mouth daily.          Objective:    BP 105/68   Pulse 78   Temp 97.8 F (36.6 C) (Oral)   Ht  5\' 4"  (1.626 m)   Wt 129 lb 9.6 oz (58.8 kg)   BMI 22.25 kg/m   Allergies  Allergen Reactions  . Latex Itching and Rash  . Codeine   . Sulfa Antibiotics     Physical Exam  Constitutional: She is oriented to person, place, and time. She appears well-developed and well-nourished.  HENT:  Head: Normocephalic and atraumatic.  Eyes: Conjunctivae and EOM are normal. Pupils are equal, round, and reactive to light.  Cardiovascular: Normal rate, regular rhythm, normal heart sounds and intact distal pulses.   Pulmonary/Chest: Effort normal and breath sounds normal.  Abdominal: Soft. Bowel sounds are normal.  Neurological: She is alert and oriented to person, place, and time. She has normal reflexes.  Skin: Skin is warm and dry. Rash noted. Rash is papular.     Peeling around edge of foot  Psychiatric: She has a normal mood and affect. Her behavior is normal. Judgment and thought content normal.    Results for orders placed or performed in visit on 03/09/17  TSH  Result Value Ref Range   TSH 2.170 0.450 - 4.500 uIU/mL      Assessment & Plan:   1. Encounter for smoking cessation counseling - nicotine (NICODERM CQ - DOSED IN MG/24 HR) 7 mg/24hr patch; Place 1 patch (7 mg total) onto the skin daily.  Dispense: 28 patch; Refill: 0  2. Acquired hypothyroidism - TSH  3. Fibromyalgia - gabapentin (NEURONTIN) 100 MG capsule; Take 1 capsule (100 mg total) by mouth 3 (three) times daily.  Dispense: 270 capsule; Refill: 3  4. Right knee pain, unspecified chronicity  5. Tinea pedis of both feet - clotrimazole-betamethasone (LOTRISONE) cream; Apply 1 application topically 2 (two) times daily.  Dispense: 60 g; Refill: 5   Current Outpatient Prescriptions:  .  buPROPion (WELLBUTRIN XL) 300 MG 24 hr tablet, Take 300 mg by mouth., Disp: , Rfl:  .  clonazePAM (KLONOPIN) 0.5 MG tablet, Take 0.5 mg by mouth 2 (two) times daily. , Disp: , Rfl:  .  diclofenac (VOLTAREN) 75 MG EC tablet, Take 1  tablet (75 mg total) by mouth 2 (two) times daily., Disp: 180 tablet, Rfl: 3 .  eletriptan (RELPAX) 40 MG tablet, Take 40 mg by mouth as needed for migraine. One tablet by mouth at onset of headache. May repeat in 2 hours if headache persists or recurs., Disp: , Rfl:  .  gabapentin (NEURONTIN) 100 MG capsule, Take 1 capsule (100 mg total) by mouth 3 (three) times daily., Disp: 270 capsule, Rfl: 3 .  HYDROcodone-acetaminophen (NORCO) 10-325 MG tablet, Take 1 tablet by mouth every 8 (eight)  hours as needed., Disp: 90 tablet, Rfl: 0 .  imipramine (TOFRANIL) 10 MG tablet, Take 30 mg by mouth at bedtime. , Disp: , Rfl:  .  methocarbamol (ROBAXIN) 750 MG tablet, TAKE 1/2 TO 2 TABLETS UP TO 4 TIMES DAILY, Disp: 120 tablet, Rfl: 0 .  MINIVELLE 0.1 MG/24HR patch, Place 1 patch onto the skin 2 (two) times a week. , Disp: , Rfl:  .  omeprazole (PRILOSEC) 20 MG capsule, Take 1 capsule (20 mg total) by mouth daily., Disp: 90 capsule, Rfl: 3 .  clotrimazole-betamethasone (LOTRISONE) cream, Apply 1 application topically 2 (two) times daily., Disp: 60 g, Rfl: 5 .  levothyroxine (SYNTHROID, LEVOTHROID) 50 MCG tablet, TAKE 1 TABLET BY MOUTH ONCE DAILY BEFORE BREAKFAST, Disp: 90 tablet, Rfl: 3 .  nicotine (NICODERM CQ - DOSED IN MG/24 HR) 7 mg/24hr patch, Place 1 patch (7 mg total) onto the skin daily., Disp: 28 patch, Rfl: 0  Continue all other maintenance medications as listed above.  Follow up plan: Return in about 6 months (around 09/08/2017) for recheck.  Educational handout given for hypothroidism  Terald Sleeper PA-C [facility] [address]  [place], [place] [address] [phone]   03/12/2017, 8:05 AM

## 2017-03-16 DIAGNOSIS — Z975 Presence of (intrauterine) contraceptive device: Secondary | ICD-10-CM | POA: Diagnosis not present

## 2017-03-16 DIAGNOSIS — G43901 Migraine, unspecified, not intractable, with status migrainosus: Secondary | ICD-10-CM | POA: Diagnosis not present

## 2017-03-16 DIAGNOSIS — Z720 Tobacco use: Secondary | ICD-10-CM | POA: Diagnosis not present

## 2017-03-16 DIAGNOSIS — Z87898 Personal history of other specified conditions: Secondary | ICD-10-CM | POA: Diagnosis not present

## 2017-03-16 DIAGNOSIS — R69 Illness, unspecified: Secondary | ICD-10-CM | POA: Diagnosis not present

## 2017-03-16 DIAGNOSIS — Z86000 Personal history of in-situ neoplasm of breast: Secondary | ICD-10-CM | POA: Diagnosis not present

## 2017-03-16 DIAGNOSIS — Z79890 Hormone replacement therapy (postmenopausal): Secondary | ICD-10-CM | POA: Diagnosis not present

## 2017-03-16 DIAGNOSIS — Z9189 Other specified personal risk factors, not elsewhere classified: Secondary | ICD-10-CM | POA: Diagnosis not present

## 2017-03-16 DIAGNOSIS — D0501 Lobular carcinoma in situ of right breast: Secondary | ICD-10-CM | POA: Diagnosis not present

## 2017-03-16 DIAGNOSIS — Z08 Encounter for follow-up examination after completed treatment for malignant neoplasm: Secondary | ICD-10-CM | POA: Diagnosis not present

## 2017-03-16 DIAGNOSIS — Z716 Tobacco abuse counseling: Secondary | ICD-10-CM | POA: Diagnosis not present

## 2017-03-16 DIAGNOSIS — M797 Fibromyalgia: Secondary | ICD-10-CM | POA: Diagnosis not present

## 2017-04-18 DIAGNOSIS — R69 Illness, unspecified: Secondary | ICD-10-CM | POA: Diagnosis not present

## 2017-04-18 DIAGNOSIS — F411 Generalized anxiety disorder: Secondary | ICD-10-CM | POA: Diagnosis not present

## 2017-05-13 ENCOUNTER — Other Ambulatory Visit: Payer: Self-pay | Admitting: Physician Assistant

## 2017-05-13 DIAGNOSIS — N3001 Acute cystitis with hematuria: Secondary | ICD-10-CM

## 2017-05-16 NOTE — Telephone Encounter (Signed)
lmtcb

## 2017-05-16 NOTE — Telephone Encounter (Signed)
Covering for PCP  Needs to be seen for refill of cipro.   Laroy Apple, MD [facility] 05/16/2017, 10:00 AM

## 2017-05-16 NOTE — Telephone Encounter (Signed)
Last seen 03/09/17  [facility]

## 2017-06-13 DIAGNOSIS — R69 Illness, unspecified: Secondary | ICD-10-CM | POA: Diagnosis not present

## 2017-06-13 DIAGNOSIS — F411 Generalized anxiety disorder: Secondary | ICD-10-CM | POA: Diagnosis not present

## 2017-06-16 DIAGNOSIS — Z6822 Body mass index (BMI) 22.0-22.9, adult: Secondary | ICD-10-CM | POA: Diagnosis not present

## 2017-06-16 DIAGNOSIS — Z01419 Encounter for gynecological examination (general) (routine) without abnormal findings: Secondary | ICD-10-CM | POA: Diagnosis not present

## 2017-06-29 ENCOUNTER — Other Ambulatory Visit: Payer: Self-pay

## 2017-06-29 ENCOUNTER — Ambulatory Visit (INDEPENDENT_AMBULATORY_CARE_PROVIDER_SITE_OTHER): Payer: Medicare HMO

## 2017-06-29 ENCOUNTER — Other Ambulatory Visit: Payer: Self-pay | Admitting: Podiatry

## 2017-06-29 ENCOUNTER — Ambulatory Visit (INDEPENDENT_AMBULATORY_CARE_PROVIDER_SITE_OTHER): Payer: Medicare HMO | Admitting: Podiatry

## 2017-06-29 ENCOUNTER — Encounter: Payer: Self-pay | Admitting: Podiatry

## 2017-06-29 DIAGNOSIS — M779 Enthesopathy, unspecified: Secondary | ICD-10-CM

## 2017-06-29 DIAGNOSIS — M7751 Other enthesopathy of right foot: Secondary | ICD-10-CM

## 2017-06-29 DIAGNOSIS — M79671 Pain in right foot: Secondary | ICD-10-CM

## 2017-06-29 MED ORDER — TRIAMCINOLONE ACETONIDE 10 MG/ML IJ SUSP
10.0000 mg | Freq: Once | INTRAMUSCULAR | Status: AC
  Administered 2017-06-29: 10 mg

## 2017-06-30 NOTE — Progress Notes (Signed)
Subjective:    Patient ID: Ruth Hopkins, female   DOB: 51 y.o.   MRN: [id number]   HPI patient presents stating that she's developing pain again in her ankles and she did have a significant period of relief but gradually it has started to reoccur    ROS      Objective:  Physical Exam neurovascular status intact with inflammation pain in the sinus tarsi bilateral with fluid buildup and inflammatory capsulitis with palpation     Assessment:   Inflammatory capsulitis of the sinus tarsi bilateral      Plan:   Injected the sinus tarsi bilateral 3 mg Kenalog 5 mg Xylocaine advised on continued supportive-type shoe gear and applied padding to the second and third digits right which are slightly irritated

## 2017-07-18 DIAGNOSIS — R922 Inconclusive mammogram: Secondary | ICD-10-CM | POA: Insufficient documentation

## 2017-07-19 DIAGNOSIS — D0501 Lobular carcinoma in situ of right breast: Secondary | ICD-10-CM | POA: Diagnosis not present

## 2017-07-19 DIAGNOSIS — R922 Inconclusive mammogram: Secondary | ICD-10-CM | POA: Diagnosis not present

## 2017-07-19 DIAGNOSIS — Z9189 Other specified personal risk factors, not elsewhere classified: Secondary | ICD-10-CM | POA: Diagnosis not present

## 2017-07-19 DIAGNOSIS — Z1231 Encounter for screening mammogram for malignant neoplasm of breast: Secondary | ICD-10-CM | POA: Diagnosis not present

## 2017-08-08 ENCOUNTER — Ambulatory Visit: Payer: Medicare HMO | Admitting: Family Medicine

## 2017-08-08 ENCOUNTER — Ambulatory Visit (INDEPENDENT_AMBULATORY_CARE_PROVIDER_SITE_OTHER): Payer: Medicare HMO | Admitting: Family Medicine

## 2017-08-08 ENCOUNTER — Encounter: Payer: Self-pay | Admitting: Family Medicine

## 2017-08-08 ENCOUNTER — Ambulatory Visit: Payer: Medicare HMO | Admitting: Physician Assistant

## 2017-08-08 VITALS — BP 111/76 | HR 72 | Temp 98.3°F | Ht 64.0 in | Wt 130.0 lb

## 2017-08-08 VITALS — BP 111/76 | HR 72 | Temp 98.3°F | Wt 130.0 lb

## 2017-08-08 DIAGNOSIS — R300 Dysuria: Secondary | ICD-10-CM

## 2017-08-08 DIAGNOSIS — N3001 Acute cystitis with hematuria: Secondary | ICD-10-CM | POA: Diagnosis not present

## 2017-08-08 LAB — URINALYSIS, COMPLETE
Bilirubin, UA: NEGATIVE
GLUCOSE, UA: NEGATIVE
KETONES UA: NEGATIVE
LEUKOCYTES UA: NEGATIVE
NITRITE UA: NEGATIVE
Protein, UA: NEGATIVE
SPEC GRAV UA: 1.02 (ref 1.005–1.030)
Urobilinogen, Ur: 0.2 mg/dL (ref 0.2–1.0)
pH, UA: 5.5 (ref 5.0–7.5)

## 2017-08-08 LAB — MICROSCOPIC EXAMINATION: Renal Epithel, UA: NONE SEEN /hpf

## 2017-08-08 MED ORDER — CEPHALEXIN 500 MG PO CAPS: 500.0000 mg | ORAL_CAPSULE | Freq: Two times a day (BID) | ORAL | 0 refills | Status: AC

## 2017-08-08 MED ORDER — FLUCONAZOLE 150 MG PO TABS: 150.0000 mg | ORAL_TABLET | Freq: Once | ORAL | 0 refills | Status: DC

## 2017-08-08 MED ORDER — PHENAZOPYRIDINE HCL 200 MG PO TABS: 200.0000 mg | ORAL_TABLET | Freq: Three times a day (TID) | ORAL | 0 refills | Status: DC | PRN

## 2017-08-08 NOTE — Progress Notes (Signed)
Subjective: AO:ZHYQMVH symptoms PCP: Terald Sleeper, PA-C QIO:NGEXB H Gildner is a 51 y.o. female presenting to clinic today for:  1.  Urinary symptoms Patient reports a 1 week  h/o dysuria and urinary frequency.  She developed lower abdominal pressure and slight nausea 2 days ago.  She denies urinary urgency, hematuria, fevers, chills, abdominal pain, nausea, vomiting, back pain, vaginal discharge.  Patient has used nothing for symptoms.  Patient denies a h/o frequent or recurrent UTIs.   She does report that she occasionally will get a yeast infection with antibiotic use.   Allergies  Allergen Reactions  . Latex Itching and Rash  . Codeine   . Sulfa Antibiotics    Past Medical History:  Diagnosis Date  . Chronic fatigue   . Depression   . Fibromyalgia   . IBS (irritable bowel syndrome)   . Migraine   . Thyroid disease    Family History  Problem Relation Age of Onset  . Asthma Mother   . Hypothyroidism Mother   . Hypertension Mother   . Raynaud syndrome Mother   . Hypertension Father     Current Outpatient Medications:  .  buPROPion (WELLBUTRIN XL) 300 MG 24 hr tablet, Take 300 mg by mouth., Disp: , Rfl:  .  clonazePAM (KLONOPIN) 0.5 MG tablet, Take 0.5 mg by mouth 2 (two) times daily. , Disp: , Rfl:  .  diclofenac (VOLTAREN) 75 MG EC tablet, Take 1 tablet (75 mg total) by mouth 2 (two) times daily., Disp: 180 tablet, Rfl: 3 .  eletriptan (RELPAX) 40 MG tablet, Take 40 mg by mouth as needed for migraine. One tablet by mouth at onset of headache. May repeat in 2 hours if headache persists or recurs., Disp: , Rfl:  .  gabapentin (NEURONTIN) 100 MG capsule, Take 1 capsule (100 mg total) by mouth 3 (three) times daily., Disp: 270 capsule, Rfl: 3 .  HYDROcodone-acetaminophen (NORCO) 10-325 MG tablet, Take 1 tablet by mouth every 8 (eight) hours as needed. (Patient not taking: Reported on 08/08/2017), Disp: 90 tablet, Rfl: 0 .  imipramine (TOFRANIL) 10 MG tablet, Take 30 mg by  mouth at bedtime. , Disp: , Rfl:  .  levothyroxine (SYNTHROID, LEVOTHROID) 50 MCG tablet, TAKE 1 TABLET BY MOUTH ONCE DAILY BEFORE BREAKFAST, Disp: 90 tablet, Rfl: 3 .  methocarbamol (ROBAXIN) 750 MG tablet, TAKE 1/2 TO 2 TABLETS UP TO 4 TIMES DAILY, Disp: 120 tablet, Rfl: 0 .  MINIVELLE 0.1 MG/24HR patch, Place 1 patch onto the skin 2 (two) times a week. , Disp: , Rfl:  .  omeprazole (PRILOSEC) 20 MG capsule, Take 1 capsule (20 mg total) by mouth daily., Disp: 90 capsule, Rfl: 3  Social Hx: daily smoker. ROS: Per HPI  Objective: Office vital signs reviewed. BP 111/76   Pulse 72   Temp 98.3 F (36.8 C)   Wt 130 lb (59 kg)   BMI 22.31 kg/m   Physical Examination:  General: Awake, alert, well nourished, nontoxic, No acute distress Cardio: regular rate ; +2DP Pulm: Normal work of breathing on room air, no wheezes GU: No suprapubic tenderness to palpation.  No CVA tenderness to palpation.  No results found for this or any previous visit (from the past 24 hour(s)).   Assessment/ Plan: 51 y.o. female   1. Acute cystitis with hematuria Patient is well-appearing and afebrile.  Normal vital signs.  Urinalysis with 2+ blood.  Urine microscopy with 0-5 white blood cells, 3-10 red blood cells and few bacteria.  Urine has been sent for culture.  I reviewed her last 2 urine cultures.  The December 2017 culture had no growth.  Her September 2017 urine culture did grow Proteus which was sensitive to cephalosporins, including Keflex.  I prescribed her Keflex 500 mg twice daily by 5 days.  I also prescribed Pyridium 200 mg 3 times daily as needed dysuria.  A single dose of Diflucan was sent in as needed yeast infection.  Strict return precautions and reasons for emergent evaluation in the emergency department review with patient.  They voiced understanding and will follow-up as needed.     - Urine Culture   Orders Placed This Encounter  Procedures  . Urine Culture   Meds ordered this encounter    Medications  . cephALEXin (KEFLEX) 500 MG capsule    Sig: Take 1 capsule (500 mg total) 2 (two) times daily for 5 days by mouth.    Dispense:  10 capsule    Refill:  0  . phenazopyridine (PYRIDIUM) 200 MG tablet    Sig: Take 1 tablet (200 mg total) 3 (three) times daily as needed by mouth for pain ((urinary burning)).    Dispense:  6 tablet    Refill:  0  . fluconazole (DIFLUCAN) 150 MG tablet    Sig: Take 1 tablet (150 mg total) once for 1 dose by mouth.    Dispense:  1 tablet    Refill:  [place], DO [facility] [phone]

## 2017-08-08 NOTE — Addendum Note (Signed)
Addended byFaylene Million C on: 08/08/2017 01:09 PM   Modules accepted: Orders

## 2017-08-08 NOTE — Progress Notes (Signed)
Erroneous encounter.  See other note for UTI.

## 2017-08-08 NOTE — Patient Instructions (Signed)
I have sent in cephalexin for you to take twice a day for the next 5 days.  I reviewed your last urine culture from September and the bacteria that grew was sensitive to this medication.  I have also sent in Pyridium for you to take up to 3 times daily as needed for urinary burning.  Once the urine culture results return, you will be contacted.   Urinary Tract Infection, Adult A urinary tract infection (UTI) is an infection of any part of the urinary tract. The urinary tract includes the:  Kidneys.  Ureters.  Bladder.  Urethra.  These organs make, store, and get rid of pee (urine) in the body. Follow these instructions at home:  Take over-the-counter and prescription medicines only as told by your doctor.  If you were prescribed an antibiotic medicine, take it as told by your doctor. Do not stop taking the antibiotic even if you start to feel better.  Avoid the following drinks: ? Alcohol. ? Caffeine. ? Tea. ? Carbonated drinks.  Drink enough fluid to keep your pee clear or pale yellow.  Keep all follow-up visits as told by your doctor. This is important.  Make sure to: ? Empty your bladder often and completely. Do not to hold pee for long periods of time. ? Empty your bladder before and after sex. ? Wipe from front to back after a bowel movement if you are female. Use each tissue one time when you wipe. Contact a doctor if:  You have back pain.  You have a fever.  You feel sick to your stomach (nauseous).  You throw up (vomit).  Your symptoms do not get better after 3 days.  Your symptoms go away and then come back. Get help right away if:  You have very bad back pain.  You have very bad lower belly (abdominal) pain.  You are throwing up and cannot keep down any medicines or water. This information is not intended to replace advice given to you by your health care provider. Make sure you discuss any questions you have with your health care provider. Document  Released: 02/23/2008 Document Revised: 02/12/2016 Document Reviewed: 07/28/2015 Elsevier Interactive Patient Education  [employer].

## 2017-08-11 LAB — URINE CULTURE

## 2017-08-12 ENCOUNTER — Other Ambulatory Visit: Payer: Self-pay | Admitting: Pediatrics

## 2017-08-12 ENCOUNTER — Telehealth: Payer: Self-pay | Admitting: Physician Assistant

## 2017-08-12 MED ORDER — NITROFURANTOIN MONOHYD MACRO 100 MG PO CAPS: 100.0000 mg | ORAL_CAPSULE | Freq: Two times a day (BID) | ORAL | 0 refills | Status: DC

## 2017-08-12 MED ORDER — DOXYCYCLINE HYCLATE 100 MG PO TABS: 100.0000 mg | ORAL_TABLET | Freq: Two times a day (BID) | ORAL | 0 refills | Status: DC

## 2017-08-12 NOTE — Telephone Encounter (Signed)
Changed macrobid to doxycycline due to expense

## 2017-08-12 NOTE — Telephone Encounter (Signed)
Pt was seen on 11/19 and culture was obtained, results back. Spoke with MMM and she looked at the culture and gave verbal order Macrobid 100mg  BID x 7 days. Pt is aware and rx sent to [facility] [place] per pt request.

## 2017-08-12 NOTE — Telephone Encounter (Signed)
Pt is taken Keflex for UTI and patient states she is no better. Pt c/o urgency and frequency and dysuria. Denies blood of fever. Pt allergic to sulfa and codeine. [facility] uses [facility]

## 2017-09-27 DIAGNOSIS — R69 Illness, unspecified: Secondary | ICD-10-CM | POA: Diagnosis not present

## 2017-09-28 ENCOUNTER — Encounter: Payer: Self-pay | Admitting: Podiatry

## 2017-09-28 ENCOUNTER — Ambulatory Visit: Payer: Medicare HMO | Admitting: Podiatry

## 2017-09-28 DIAGNOSIS — M779 Enthesopathy, unspecified: Secondary | ICD-10-CM

## 2017-09-28 MED ORDER — TRIAMCINOLONE ACETONIDE 10 MG/ML IJ SUSP
10.0000 mg | Freq: Once | INTRAMUSCULAR | Status: AC
  Administered 2017-09-28: 10 mg

## 2017-09-28 NOTE — Progress Notes (Signed)
Subjective:   Patient ID: Ruth Hopkins, female   DOB: 52 y.o.   MRN: [id number]   HPI Patient presents stating my ankle on my right has been hurting more than my left not been very active with the holidays prior to hurting   ROS      Objective:  Physical Exam  Inflammation of the sinus tarsi right with fluid buildup and slight distal pain from this area     Assessment:  Inflammatory capsulitis right foot with dorsal and distal tendinitis     Plan:  H&P condition reviewed and today I injected the right sinus tarsi 3 mg Kenalog 5 mg Xylocaine and sent it in a more distal direction

## 2017-10-11 DIAGNOSIS — R922 Inconclusive mammogram: Secondary | ICD-10-CM | POA: Diagnosis not present

## 2017-10-11 DIAGNOSIS — Z9189 Other specified personal risk factors, not elsewhere classified: Secondary | ICD-10-CM | POA: Diagnosis not present

## 2017-10-11 DIAGNOSIS — Z1231 Encounter for screening mammogram for malignant neoplasm of breast: Secondary | ICD-10-CM | POA: Diagnosis not present

## 2017-10-11 DIAGNOSIS — D0501 Lobular carcinoma in situ of right breast: Secondary | ICD-10-CM | POA: Diagnosis not present

## 2017-10-15 ENCOUNTER — Ambulatory Visit (INDEPENDENT_AMBULATORY_CARE_PROVIDER_SITE_OTHER): Payer: Medicare HMO | Admitting: Family

## 2017-10-15 ENCOUNTER — Encounter: Payer: Self-pay | Admitting: Family

## 2017-10-15 VITALS — BP 121/77 | HR 83 | Temp 98.4°F | Ht 64.0 in | Wt 132.4 lb

## 2017-10-15 DIAGNOSIS — N3001 Acute cystitis with hematuria: Secondary | ICD-10-CM | POA: Diagnosis not present

## 2017-10-15 DIAGNOSIS — R300 Dysuria: Secondary | ICD-10-CM | POA: Diagnosis not present

## 2017-10-15 MED ORDER — CIPROFLOXACIN HCL 500 MG PO TABS: 500.0000 mg | ORAL_TABLET | Freq: Two times a day (BID) | ORAL | 0 refills | Status: DC

## 2017-10-15 NOTE — Patient Instructions (Signed)

## 2017-10-15 NOTE — Progress Notes (Signed)
   Subjective:    Patient ID: Ruth Hopkins, female    DOB: March 13, 1966, 52 y.o.   MRN: [id number]  Dysuria   This is a new problem. The current episode started in the past 7 days. The problem occurs every urination. The problem has been gradually worsening. The quality of the pain is described as burning. The pain is at a severity of 7/10. The pain is mild. There has been no fever. Associated symptoms include frequency, hesitancy and urgency. Pertinent negatives include no hematuria, nausea or vomiting. She has tried increased fluids for the symptoms. The treatment provided mild relief.      Review of Systems  Gastrointestinal: Negative for nausea and vomiting.  Genitourinary: Positive for dysuria, frequency, hesitancy and urgency. Negative for hematuria.  All other systems reviewed and are negative.      Objective:   Physical Exam  Constitutional: She is oriented to person, place, and time. She appears well-developed and well-nourished. No distress.  HENT:  Head: Normocephalic.  Eyes: Pupils are equal, round, and reactive to light.  Neck: Normal range of motion. Neck supple. No thyromegaly present.  Cardiovascular: Normal rate, regular rhythm, normal heart sounds and intact distal pulses.  No murmur heard. Pulmonary/Chest: Effort normal and breath sounds normal. No respiratory distress. She has no wheezes.  Abdominal: Soft. Bowel sounds are normal. She exhibits no distension. There is no tenderness.  Musculoskeletal: Normal range of motion. She exhibits no edema or tenderness.  Neurological: She is alert and oriented to person, place, and time.  Skin: Skin is warm and dry.  Psychiatric: She has a normal mood and affect. Her behavior is normal. Judgment and thought content normal.  Vitals reviewed.    BP 121/77   Pulse 83   Temp 98.4 F (36.9 C) (Oral)   Ht 5\' 4"  (1.626 m)   Wt 132 lb 6.4 oz (60.1 kg)   BMI 22.73 kg/m      Assessment & Plan:  1. Dysuria - Urinalysis -  Urine Culture  2. Acute cystitis with hematuria Force fluids AZO over the counter X2 days RTO prn Culture pending - ciprofloxacin (CIPRO) 500 MG tablet; Take 1 tablet (500 mg total) by mouth 2 (two) times daily.  Dispense: 14 tablet; Refill: 0   Evelina Dun, FNP

## 2017-10-16 LAB — URINE CULTURE: Organism ID, Bacteria: NO GROWTH

## 2017-10-17 ENCOUNTER — Ambulatory Visit: Payer: Medicare HMO | Admitting: Physician Assistant

## 2017-10-17 LAB — URINALYSIS
BILIRUBIN UA: NEGATIVE
GLUCOSE, UA: NEGATIVE
Leukocytes, UA: NEGATIVE
NITRITE UA: NEGATIVE
Protein, UA: NEGATIVE
Specific Gravity, UA: 1.025 (ref 1.005–1.030)
UUROB: 1 mg/dL (ref 0.2–1.0)
pH, UA: 6 (ref 5.0–7.5)

## 2017-10-19 DIAGNOSIS — F411 Generalized anxiety disorder: Secondary | ICD-10-CM | POA: Diagnosis not present

## 2017-10-19 DIAGNOSIS — R69 Illness, unspecified: Secondary | ICD-10-CM | POA: Diagnosis not present

## 2017-10-25 ENCOUNTER — Ambulatory Visit: Payer: Medicare HMO

## 2017-10-28 ENCOUNTER — Ambulatory Visit: Payer: Medicare HMO | Admitting: Podiatry

## 2017-11-02 ENCOUNTER — Encounter: Payer: Self-pay | Admitting: Podiatry

## 2017-11-02 ENCOUNTER — Ambulatory Visit: Payer: Medicare HMO | Admitting: Podiatry

## 2017-11-02 DIAGNOSIS — M779 Enthesopathy, unspecified: Secondary | ICD-10-CM

## 2017-11-02 DIAGNOSIS — M79676 Pain in unspecified toe(s): Secondary | ICD-10-CM

## 2017-11-02 MED ORDER — TRIAMCINOLONE ACETONIDE 10 MG/ML IJ SUSP
10.0000 mg | Freq: Once | INTRAMUSCULAR | Status: AC
  Administered 2017-11-02: 10 mg

## 2017-11-03 NOTE — Progress Notes (Signed)
Subjective:   Patient ID: Ruth Hopkins, female   DOB: 52 y.o.   MRN: [id number]   HPI Patient presents stating she is been trying to be more active but she has pain and states it moved in the right and the left ankle has been hurting   ROS      Objective:  Physical Exam  Neurovascular status intact with the right sinus tarsi relatively quiet secondary to the treatment we have done in January.  There is now pain more in the posterior segment of the right ankle around the peroneal tendon but I did not note any temp tendon damage or any dislocation of the peroneal tendon.  On the left and noted chronic inflammation of the sinus tarsi     Assessment:  Patient who experiences chronic tendinitis and inflammatory capsulitis-like conditions     Plan:  At this time I worked with the [employer] and we are going to make her a new orthotic to try to disperse pressure on the ankles.  I did carefully inject the posterior segment right is not been done before with 3 mg dexamethasone Kenalog 5 million Xylocaine under sterile technique and discussed risk associated with injection.  Injected the left sinus tarsi and into the extensor tendon complex 3 mg dexamethasone Kenalog 5 mg Xylocaine and reappoint when orthotics are returned

## 2017-11-04 ENCOUNTER — Ambulatory Visit: Payer: Medicare HMO | Admitting: Podiatry

## 2017-12-01 ENCOUNTER — Other Ambulatory Visit: Payer: Self-pay | Admitting: Physician Assistant

## 2017-12-04 ENCOUNTER — Other Ambulatory Visit: Payer: Self-pay | Admitting: Physician Assistant

## 2017-12-06 ENCOUNTER — Encounter: Payer: Self-pay | Admitting: Family

## 2017-12-06 ENCOUNTER — Ambulatory Visit (INDEPENDENT_AMBULATORY_CARE_PROVIDER_SITE_OTHER): Payer: Medicare HMO | Admitting: Family

## 2017-12-06 VITALS — BP 99/65 | HR 81 | Temp 98.6°F | Ht 64.0 in | Wt 130.6 lb

## 2017-12-06 DIAGNOSIS — K5900 Constipation, unspecified: Secondary | ICD-10-CM | POA: Diagnosis not present

## 2017-12-06 DIAGNOSIS — R399 Unspecified symptoms and signs involving the genitourinary system: Secondary | ICD-10-CM

## 2017-12-06 DIAGNOSIS — R1030 Lower abdominal pain, unspecified: Secondary | ICD-10-CM

## 2017-12-06 DIAGNOSIS — K581 Irritable bowel syndrome with constipation: Secondary | ICD-10-CM

## 2017-12-06 MED ORDER — LINACLOTIDE 145 MCG PO CAPS: 145.0000 ug | ORAL_CAPSULE | Freq: Every day | ORAL | 1 refills | Status: DC

## 2017-12-06 NOTE — Patient Instructions (Signed)

## 2017-12-06 NOTE — Progress Notes (Signed)
   Subjective:    Patient ID: Ruth Hopkins, female    DOB: 11/09/1965, 52 y.o.   MRN: [id number]  Abdominal Pain  This is a new problem. The current episode started 1 to 4 weeks ago. The onset quality is gradual. The problem occurs intermittently. The problem has been waxing and waning. The pain is located in the suprapubic region. The pain is at a severity of 8/10. The pain is mild. The quality of the pain is aching. The abdominal pain does not radiate. Associated symptoms include belching, constipation and flatus. Pertinent negatives include no frequency, hematuria, nausea or vomiting. The treatment provided mild relief.      Review of Systems  Gastrointestinal: Positive for abdominal pain, constipation and flatus. Negative for nausea and vomiting.  Genitourinary: Negative for frequency and hematuria.  All other systems reviewed and are negative.      Objective:   Physical Exam  Constitutional: She is oriented to person, place, and time. She appears well-developed and well-nourished. No distress.  HENT:  Head: Normocephalic and atraumatic.  Right Ear: External ear normal.  Left Ear: External ear normal.  Nose: Nose normal.  Mouth/Throat: Oropharynx is clear and moist.  Eyes: Pupils are equal, round, and reactive to light.  Neck: Normal range of motion. Neck supple. No thyromegaly present.  Cardiovascular: Normal rate, regular rhythm, normal heart sounds and intact distal pulses.  No murmur heard. Pulmonary/Chest: Effort normal and breath sounds normal. No respiratory distress. She has no wheezes.  Abdominal: Soft. Bowel sounds are normal. She exhibits no distension. There is tenderness (mild tenderness in lower abd).  Musculoskeletal: Normal range of motion. She exhibits no edema or tenderness.  Neurological: She is alert and oriented to person, place, and time.  Skin: Skin is warm and dry.  Psychiatric: She has a normal mood and affect. Her behavior is normal. Judgment and thought  content normal.  Vitals reviewed.   BP 99/65   Pulse 81   Temp 98.6 F (37 C) (Oral)   Ht 5\' 4"  (1.626 m)   Wt 130 lb 9.6 oz (59.2 kg)   BMI 22.42 kg/m        Assessment & Plan:  1. Lower abdominal pain - Urinalysis, Complete  2. UTI symptoms - Urine Culture  3. Irritable bowel syndrome with constipation Will restart Linzess today Force fluids Increase activity  - Ambulatory referral to Gastroenterology - linaclotide (LINZESS) 145 MCG CAPS capsule; Take 1 capsule (145 mcg total) by mouth daily before breakfast.  Dispense: 30 capsule; Refill: 1  4. Constipation, unspecified constipation type - linaclotide (LINZESS) 145 MCG CAPS capsule; Take 1 capsule (145 mcg total) by mouth daily before breakfast.  Dispense: 30 capsule; Refill: [place], FNP

## 2017-12-06 NOTE — Addendum Note (Signed)
Addended by: Evelina Dun A on: 12/06/2017 04:32 PM   Modules accepted: Orders

## 2017-12-07 LAB — MICROSCOPIC EXAMINATION: RENAL EPITHEL UA: NONE SEEN /HPF

## 2017-12-07 LAB — URINALYSIS, COMPLETE
Bilirubin, UA: NEGATIVE
GLUCOSE, UA: NEGATIVE
LEUKOCYTES UA: NEGATIVE
Nitrite, UA: NEGATIVE
PROTEIN UA: NEGATIVE
SPEC GRAV UA: 1.02 (ref 1.005–1.030)
Urobilinogen, Ur: 1 mg/dL (ref 0.2–1.0)
pH, UA: 7 (ref 5.0–7.5)

## 2017-12-07 LAB — URINE CULTURE

## 2017-12-21 DIAGNOSIS — R69 Illness, unspecified: Secondary | ICD-10-CM | POA: Diagnosis not present

## 2017-12-23 DIAGNOSIS — Z86010 Personal history of colonic polyps: Secondary | ICD-10-CM | POA: Diagnosis not present

## 2017-12-23 DIAGNOSIS — K581 Irritable bowel syndrome with constipation: Secondary | ICD-10-CM | POA: Diagnosis not present

## 2017-12-23 DIAGNOSIS — K648 Other hemorrhoids: Secondary | ICD-10-CM | POA: Diagnosis not present

## 2017-12-25 DIAGNOSIS — K648 Other hemorrhoids: Secondary | ICD-10-CM | POA: Insufficient documentation

## 2017-12-25 DIAGNOSIS — Z86010 Personal history of colonic polyps: Secondary | ICD-10-CM | POA: Insufficient documentation

## 2017-12-25 DIAGNOSIS — Z860101 Personal history of adenomatous and serrated colon polyps: Secondary | ICD-10-CM | POA: Insufficient documentation

## 2017-12-26 DIAGNOSIS — R69 Illness, unspecified: Secondary | ICD-10-CM | POA: Diagnosis not present

## 2018-01-05 ENCOUNTER — Ambulatory Visit: Payer: Medicare HMO | Admitting: Orthotics

## 2018-01-05 DIAGNOSIS — M779 Enthesopathy, unspecified: Secondary | ICD-10-CM

## 2018-01-05 DIAGNOSIS — M79671 Pain in right foot: Secondary | ICD-10-CM

## 2018-01-05 NOTE — Progress Notes (Signed)
Patient came in today to verify [employer] was vendor that done her last f/o which she likes.  We also made a couple of changes such as increasing arch height a bit and added valgus RF post 2*

## 2018-01-10 DIAGNOSIS — Z86010 Personal history of colonic polyps: Secondary | ICD-10-CM | POA: Diagnosis not present

## 2018-01-10 DIAGNOSIS — Z1211 Encounter for screening for malignant neoplasm of colon: Secondary | ICD-10-CM | POA: Diagnosis not present

## 2018-01-12 DIAGNOSIS — R69 Illness, unspecified: Secondary | ICD-10-CM | POA: Diagnosis not present

## 2018-01-20 ENCOUNTER — Ambulatory Visit: Payer: Medicare HMO | Admitting: Nurse Practitioner

## 2018-01-20 ENCOUNTER — Ambulatory Visit (INDEPENDENT_AMBULATORY_CARE_PROVIDER_SITE_OTHER): Payer: Medicare HMO | Admitting: Family

## 2018-01-20 ENCOUNTER — Encounter: Payer: Self-pay | Admitting: Family

## 2018-01-20 VITALS — BP 105/71 | HR 79 | Temp 97.5°F | Ht 64.0 in | Wt 130.2 lb

## 2018-01-20 DIAGNOSIS — N3001 Acute cystitis with hematuria: Secondary | ICD-10-CM

## 2018-01-20 DIAGNOSIS — R350 Frequency of micturition: Secondary | ICD-10-CM

## 2018-01-20 DIAGNOSIS — R399 Unspecified symptoms and signs involving the genitourinary system: Secondary | ICD-10-CM

## 2018-01-20 LAB — URINALYSIS, COMPLETE
Bilirubin, UA: NEGATIVE
Glucose, UA: NEGATIVE
Ketones, UA: NEGATIVE
Nitrite, UA: NEGATIVE
PH UA: 6 (ref 5.0–7.5)
PROTEIN UA: NEGATIVE
Specific Gravity, UA: 1.015 (ref 1.005–1.030)
Urobilinogen, Ur: 0.2 mg/dL (ref 0.2–1.0)

## 2018-01-20 LAB — MICROSCOPIC EXAMINATION
RBC, UA: >30 /hpf — ABNORMAL HIGH (ref 0–2)
WBC, UA: >30 /hpf — ABNORMAL HIGH (ref 0–5)

## 2018-01-20 MED ORDER — CEPHALEXIN 500 MG PO CAPS: 500.0000 mg | ORAL_CAPSULE | Freq: Two times a day (BID) | ORAL | 0 refills | Status: DC

## 2018-01-20 NOTE — Progress Notes (Addendum)
   Subjective:    Patient ID: Ruth Hopkins, female    DOB: [birth year], 52 y.o.   MRN: [id number]  Chief Complaint  Patient presents with  . frequent urinating    Urinary Frequency   This is a new problem. The current episode started in the past 7 days. The problem occurs every urination. The problem has been gradually worsening. The quality of the pain is described as burning. The pain is at a severity of 7/10. The pain is mild. Associated symptoms include frequency, hesitancy and urgency. Pertinent negatives include no flank pain, hematuria, nausea or vomiting. She has tried increased fluids for the symptoms. The treatment provided no relief.      Review of Systems  Gastrointestinal: Negative for nausea and vomiting.  Genitourinary: Positive for frequency, hesitancy and urgency. Negative for flank pain and hematuria.  All other systems reviewed and are negative.      Objective:   Physical Exam  Constitutional: She is oriented to person, place, and time. She appears well-developed and well-nourished. No distress.  HENT:  Head: Normocephalic and atraumatic.  Right Ear: External ear normal.  Mouth/Throat: Oropharynx is clear and moist.  Eyes: Pupils are equal, round, and reactive to light.  Neck: Normal range of motion. Neck supple. No thyromegaly present.  Cardiovascular: Normal rate, regular rhythm, normal heart sounds and intact distal pulses.  No murmur heard. Pulmonary/Chest: Effort normal and breath sounds normal. No respiratory distress. She has no wheezes.  Abdominal: Soft. Bowel sounds are normal. She exhibits no distension. There is no tenderness.  Musculoskeletal: Normal range of motion. She exhibits no edema or tenderness.  Neurological: She is alert and oriented to person, place, and time. She has normal reflexes. No cranial nerve deficit.  Skin: Skin is warm and dry.  Psychiatric: She has a normal mood and affect. Her behavior is normal. Judgment and thought content  normal.  Vitals reviewed.     BP 105/71   Pulse 79   Temp (!) 97.5 F (36.4 C) (Oral)   Ht 5\' 4"  (1.626 m)   Wt 130 lb 3.2 oz (59.1 kg)   BMI 22.35 kg/m      Assessment & Plan:  Ruth Hopkins was seen today for frequent urinating.  Diagnoses and all orders for this visit:  Frequent urination -     Urinalysis, Complete -     Urine Culture  UTI symptoms -     Urine Culture  Acute cystitis with hematuria -     cephALEXin (KEFLEX) 500 MG capsule; Take 1 capsule (500 mg total) by mouth 2 (two) times daily. -     Urine Culture    Force fluids AZO over the counter X2 days RTO prn Culture pending   Ruth Dun, FNP

## 2018-01-20 NOTE — Addendum Note (Signed)
Addended by: Evelina Dun A on: 01/20/2018 04:23 PM   Modules accepted: Orders

## 2018-01-20 NOTE — Patient Instructions (Signed)

## 2018-01-22 ENCOUNTER — Other Ambulatory Visit: Payer: Self-pay | Admitting: Family Medicine

## 2018-01-23 LAB — URINE CULTURE

## 2018-01-31 ENCOUNTER — Other Ambulatory Visit: Payer: Medicare HMO | Admitting: Orthotics

## 2018-01-31 DIAGNOSIS — Z86000 Personal history of in-situ neoplasm of breast: Secondary | ICD-10-CM | POA: Diagnosis not present

## 2018-01-31 DIAGNOSIS — Z9189 Other specified personal risk factors, not elsewhere classified: Secondary | ICD-10-CM | POA: Diagnosis not present

## 2018-01-31 DIAGNOSIS — N6459 Other signs and symptoms in breast: Secondary | ICD-10-CM | POA: Diagnosis not present

## 2018-01-31 DIAGNOSIS — Z1231 Encounter for screening mammogram for malignant neoplasm of breast: Secondary | ICD-10-CM | POA: Diagnosis not present

## 2018-01-31 DIAGNOSIS — R922 Inconclusive mammogram: Secondary | ICD-10-CM | POA: Diagnosis not present

## 2018-02-02 ENCOUNTER — Other Ambulatory Visit: Payer: Self-pay | Admitting: Physician Assistant

## 2018-02-06 ENCOUNTER — Ambulatory Visit (INDEPENDENT_AMBULATORY_CARE_PROVIDER_SITE_OTHER): Payer: Medicare HMO | Admitting: Orthotics

## 2018-02-06 DIAGNOSIS — M79671 Pain in right foot: Secondary | ICD-10-CM

## 2018-02-06 DIAGNOSIS — G5761 Lesion of plantar nerve, right lower limb: Secondary | ICD-10-CM

## 2018-02-06 NOTE — Progress Notes (Signed)
Patient came in today to p/u redone custom f/o; valgus post added.

## 2018-02-20 ENCOUNTER — Encounter: Payer: Self-pay | Admitting: Physician Assistant

## 2018-02-20 ENCOUNTER — Ambulatory Visit (INDEPENDENT_AMBULATORY_CARE_PROVIDER_SITE_OTHER): Payer: Medicare HMO | Admitting: Physician Assistant

## 2018-02-20 VITALS — BP 97/67 | HR 78 | Temp 99.0°F | Ht 64.0 in | Wt 127.4 lb

## 2018-02-20 DIAGNOSIS — E039 Hypothyroidism, unspecified: Secondary | ICD-10-CM | POA: Diagnosis not present

## 2018-02-20 DIAGNOSIS — F411 Generalized anxiety disorder: Secondary | ICD-10-CM | POA: Diagnosis not present

## 2018-02-20 DIAGNOSIS — R350 Frequency of micturition: Secondary | ICD-10-CM | POA: Diagnosis not present

## 2018-02-20 DIAGNOSIS — R69 Illness, unspecified: Secondary | ICD-10-CM | POA: Diagnosis not present

## 2018-02-20 DIAGNOSIS — Z0000 Encounter for general adult medical examination without abnormal findings: Secondary | ICD-10-CM

## 2018-02-20 LAB — URINALYSIS, COMPLETE
BILIRUBIN UA: NEGATIVE
GLUCOSE, UA: NEGATIVE
Ketones, UA: NEGATIVE
Leukocytes, UA: NEGATIVE
Nitrite, UA: NEGATIVE
PROTEIN UA: NEGATIVE
SPEC GRAV UA: 1.02 (ref 1.005–1.030)
UUROB: 0.2 mg/dL (ref 0.2–1.0)
pH, UA: 6 (ref 5.0–7.5)

## 2018-02-20 LAB — MICROSCOPIC EXAMINATION
Epithelial Cells (non renal): >10 /hpf — AB (ref 0–10)
Renal Epithel, UA: NONE SEEN /hpf

## 2018-02-20 MED ORDER — NICOTINE 21 MG/24HR TD PT24: 21.0000 mg | MEDICATED_PATCH | Freq: Every day | TRANSDERMAL | 2 refills | Status: DC

## 2018-02-20 MED ORDER — ECONAZOLE NITRATE 1 % EX CREA: TOPICAL_CREAM | Freq: Every day | CUTANEOUS | 5 refills | Status: DC

## 2018-02-20 MED ORDER — FLUCONAZOLE 150 MG PO TABS: ORAL_TABLET | ORAL | 0 refills | Status: DC

## 2018-02-21 LAB — THYROID PANEL WITH TSH
Free Thyroxine Index: 2 (ref 1.2–4.9)
T3 Uptake Ratio: 29 % (ref 24–39)
T4, Total: 6.8 ug/dL (ref 4.5–12.0)
TSH: 2.22 u[IU]/mL (ref 0.450–4.500)

## 2018-02-21 NOTE — Progress Notes (Signed)
BP 97/67   Pulse 78   Temp 99 F (37.2 C) (Oral)   Ht '5\' 4"'$  (1.626 m)   Wt 127 lb 6.4 oz (57.8 kg)   BMI 21.87 kg/m    Subjective:    Patient ID: Ruth Hopkins, female    DOB: Jul 04, 1966, 52 y.o.   MRN: [id number]  HPI: Ruth Hopkins is a 52 y.o. female presenting on 02/20/2018 for Hypothyroidism; Nicotine Dependence; and Urinary Frequency  This patient comes in due to some urinary urgency.  She denies any fever or chills.  She has had problems like this in the past.  She has had urinary tract infections in the past.  She also needs labs performed for her wellness and hypothyroidism.  We will set up labs for this.  She also needs a refill on the cream for her feet.  She has some significant dryness at times.  She never gets an infection.  Past Medical History:  Diagnosis Date  . Chronic fatigue   . Depression   . Fibromyalgia   . IBS (irritable bowel syndrome)   . Migraine   . Thyroid disease    Relevant past medical, surgical, family and social history reviewed and updated as indicated. Interim medical history since our last visit reviewed. Allergies and medications reviewed and updated. DATA REVIEWED: CHART IN EPIC  Family History reviewed for pertinent findings.  Review of Systems  Constitutional: Negative.   HENT: Negative.   Eyes: Negative.   Respiratory: Negative.   Gastrointestinal: Negative for abdominal pain, blood in stool and constipation.  Genitourinary: Positive for difficulty urinating and dysuria. Negative for flank pain, frequency and hematuria.  Skin: Positive for rash.    Allergies as of 02/20/2018      Reactions   Latex Itching, Rash   Codeine    Sulfa Antibiotics       Medication List        Accurate as of 02/20/18 11:59 PM. Always use your most recent med list.          acetaminophen 500 MG tablet Commonly known as:  TYLENOL Take by mouth.   buPROPion 300 MG 24 hr tablet Commonly known as:  WELLBUTRIN XL bupropion HCl XL 300 mg 24 hr  tablet, extended release   clonazePAM 0.5 MG tablet Commonly known as:  KLONOPIN Take 0.5 mg by mouth 2 (two) times daily.   diclofenac 75 MG EC tablet Commonly known as:  VOLTAREN TAKE 1 TABLET BY MOUTH TWICE DAILY   econazole nitrate 1 % cream Apply topically daily.   eletriptan 40 MG tablet Commonly known as:  RELPAX Take by mouth.   fluconazole 150 MG tablet Commonly known as:  DIFLUCAN 1 po q week x 4 weeks   HYDROcodone-acetaminophen 10-325 MG tablet Commonly known as:  NORCO Take 1 tablet by mouth every 8 (eight) hours as needed.   imipramine 10 MG tablet Commonly known as:  TOFRANIL Take 30 mg by mouth at bedtime.   levothyroxine 50 MCG tablet Commonly known as:  SYNTHROID, LEVOTHROID TAKE 1 TABLET BY MOUTH ONCE DAILY BEFORE BREAKFAST   methocarbamol 750 MG tablet Commonly known as:  ROBAXIN TAKE 1/2 TO 2 TABLETS UP TO 4 TIMES DAILY   MINIVELLE 0.1 MG/24HR patch Generic drug:  estradiol Place 1 patch onto the skin 2 (two) times a week.   mometasone 50 MCG/ACT nasal spray Commonly known as:  NASONEX mometasone 50 mcg/actuation nasal spray   MULTIVITAMIN PO Take by mouth.  nicotine 21 mg/24hr patch Commonly known as:  NICODERM CQ - dosed in mg/24 hours Place 1 patch (21 mg total) onto the skin daily.   omeprazole 20 MG capsule Commonly known as:  PRILOSEC TAKE 1 CAPSULE (20 MG TOTAL) BY MOUTH DAILY.   valACYclovir 500 MG tablet Commonly known as:  VALTREX          Objective:    BP 97/67   Pulse 78   Temp 99 F (37.2 C) (Oral)   Ht '5\' 4"'$  (1.626 m)   Wt 127 lb 6.4 oz (57.8 kg)   BMI 21.87 kg/m   Allergies  Allergen Reactions  . Latex Itching and Rash  . Codeine   . Sulfa Antibiotics     Wt Readings from Last 3 Encounters:  02/20/18 127 lb 6.4 oz (57.8 kg)  01/20/18 130 lb 3.2 oz (59.1 kg)  12/06/17 130 lb 9.6 oz (59.2 kg)    Physical Exam  Constitutional: She is oriented to person, place, and time. She appears well-developed  and well-nourished.  HENT:  Head: Normocephalic and atraumatic.  Right Ear: Tympanic membrane, external ear and ear canal normal.  Left Ear: Tympanic membrane, external ear and ear canal normal.  Nose: Nose normal. No rhinorrhea.  Mouth/Throat: Oropharynx is clear and moist and mucous membranes are normal. No oropharyngeal exudate or posterior oropharyngeal erythema.  Eyes: Pupils are equal, round, and reactive to light. Conjunctivae and EOM are normal.  Neck: Normal range of motion. Neck supple.  Cardiovascular: Normal rate, regular rhythm, normal heart sounds and intact distal pulses.  Pulmonary/Chest: Effort normal and breath sounds normal.  Abdominal: Soft. Bowel sounds are normal.  Neurological: She is alert and oriented to person, place, and time. She has normal reflexes.  Skin: Skin is warm and dry. No rash noted.  Psychiatric: She has a normal mood and affect. Her behavior is normal. Judgment and thought content normal.    Results for orders placed or performed in visit on 02/20/18  Microscopic Examination  Result Value Ref Range   WBC, UA 0-5 0 - 5 /hpf   RBC, UA 3-10 (A) 0 - 2 /hpf   Epithelial Cells (non renal) >10 (A) 0 - 10 /hpf   Renal Epithel, UA None seen None seen /hpf   Bacteria, UA Few None seen/Few   Yeast, UA Present None seen  Urinalysis, Complete  Result Value Ref Range   Specific Gravity, UA 1.020 1.005 - 1.030   pH, UA 6.0 5.0 - 7.5   Color, UA Yellow Yellow   Appearance Ur Clear Clear   Leukocytes, UA Negative Negative   Protein, UA Negative Negative/Trace   Glucose, UA Negative Negative   Ketones, UA Negative Negative   RBC, UA 1+ (A) Negative   Bilirubin, UA Negative Negative   Urobilinogen, Ur 0.2 0.2 - 1.0 mg/dL   Nitrite, UA Negative Negative   Microscopic Examination See below:   Thyroid Panel With TSH  Result Value Ref Range   TSH 2.220 0.450 - 4.500 uIU/mL   T4, Total 6.8 4.5 - 12.0 ug/dL   T3 Uptake Ratio 29 24 - 39 %   Free Thyroxine  Index 2.0 1.2 - 4.9      Assessment & Plan:   1. Frequent urination - Urine Culture - Urinalysis, Complete  2. Acquired hypothyroidism - Thyroid Panel With TSH  3. Well adult exam - CBC with Differential/Platelet; Future - CMP14+EGFR; Future - Lipid panel; Future   Continue all other maintenance medications as  listed above.  Follow up plan: No follow-ups on file.  Educational handout given for [place] PA-C [place] [address]  [place], [place] [address] [phone]   02/21/2018, 1:43 PM

## 2018-02-22 LAB — URINE CULTURE

## 2018-03-01 ENCOUNTER — Other Ambulatory Visit: Payer: Self-pay | Admitting: Physician Assistant

## 2018-03-02 ENCOUNTER — Other Ambulatory Visit: Payer: Self-pay | Admitting: Physician Assistant

## 2018-03-13 ENCOUNTER — Ambulatory Visit: Payer: Medicare HMO | Admitting: Orthotics

## 2018-03-13 DIAGNOSIS — M778 Other enthesopathies, not elsewhere classified: Secondary | ICD-10-CM

## 2018-03-13 DIAGNOSIS — M79671 Pain in right foot: Secondary | ICD-10-CM

## 2018-03-13 DIAGNOSIS — M779 Enthesopathy, unspecified: Secondary | ICD-10-CM

## 2018-03-13 DIAGNOSIS — G5761 Lesion of plantar nerve, right lower limb: Secondary | ICD-10-CM

## 2018-03-13 NOTE — Progress Notes (Signed)
Patient wants f/o adjusted like ones made in 2013

## 2018-03-18 ENCOUNTER — Other Ambulatory Visit: Payer: Self-pay | Admitting: Nurse Practitioner

## 2018-03-22 ENCOUNTER — Other Ambulatory Visit: Payer: Self-pay | Admitting: Nurse Practitioner

## 2018-04-06 DIAGNOSIS — Z853 Personal history of malignant neoplasm of breast: Secondary | ICD-10-CM | POA: Diagnosis not present

## 2018-04-06 DIAGNOSIS — R922 Inconclusive mammogram: Secondary | ICD-10-CM | POA: Diagnosis not present

## 2018-04-06 DIAGNOSIS — N6459 Other signs and symptoms in breast: Secondary | ICD-10-CM | POA: Diagnosis not present

## 2018-04-06 DIAGNOSIS — Z9189 Other specified personal risk factors, not elsewhere classified: Secondary | ICD-10-CM | POA: Diagnosis not present

## 2018-04-06 DIAGNOSIS — Z86000 Personal history of in-situ neoplasm of breast: Secondary | ICD-10-CM | POA: Diagnosis not present

## 2018-04-12 DIAGNOSIS — N760 Acute vaginitis: Secondary | ICD-10-CM | POA: Diagnosis not present

## 2018-04-12 DIAGNOSIS — D229 Melanocytic nevi, unspecified: Secondary | ICD-10-CM | POA: Diagnosis not present

## 2018-04-12 DIAGNOSIS — Z113 Encounter for screening for infections with a predominantly sexual mode of transmission: Secondary | ICD-10-CM | POA: Diagnosis not present

## 2018-04-12 DIAGNOSIS — R69 Illness, unspecified: Secondary | ICD-10-CM | POA: Diagnosis not present

## 2018-04-12 DIAGNOSIS — L728 Other follicular cysts of the skin and subcutaneous tissue: Secondary | ICD-10-CM | POA: Diagnosis not present

## 2018-04-17 ENCOUNTER — Ambulatory Visit: Payer: Medicare HMO | Admitting: Podiatry

## 2018-04-17 ENCOUNTER — Ambulatory Visit (INDEPENDENT_AMBULATORY_CARE_PROVIDER_SITE_OTHER): Payer: Medicare HMO | Admitting: Orthotics

## 2018-04-17 ENCOUNTER — Encounter: Payer: Self-pay | Admitting: Podiatry

## 2018-04-17 DIAGNOSIS — M79671 Pain in right foot: Secondary | ICD-10-CM

## 2018-04-17 DIAGNOSIS — M779 Enthesopathy, unspecified: Secondary | ICD-10-CM

## 2018-04-17 DIAGNOSIS — G5761 Lesion of plantar nerve, right lower limb: Secondary | ICD-10-CM

## 2018-04-17 MED ORDER — TRIAMCINOLONE ACETONIDE 10 MG/ML IJ SUSP
10.0000 mg | Freq: Once | INTRAMUSCULAR | Status: AC
Start: 2018-04-17 — End: 2018-04-17
  Administered 2018-04-17: 10 mg

## 2018-04-17 NOTE — Progress Notes (Signed)
Sending f/o back..met pad to far distal.

## 2018-04-18 NOTE — Progress Notes (Signed)
Subjective:   Patient ID: Ruth Hopkins, female   DOB: 52 y.o.   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

## 2018-04-24 ENCOUNTER — Other Ambulatory Visit: Payer: Self-pay | Admitting: Physician Assistant

## 2018-04-24 DIAGNOSIS — N3001 Acute cystitis with hematuria: Secondary | ICD-10-CM

## 2018-04-25 ENCOUNTER — Ambulatory Visit (INDEPENDENT_AMBULATORY_CARE_PROVIDER_SITE_OTHER): Payer: Medicare HMO | Admitting: Family

## 2018-04-25 ENCOUNTER — Encounter: Payer: Self-pay | Admitting: Family

## 2018-04-25 VITALS — BP 98/69 | HR 74 | Temp 97.7°F | Ht 64.0 in | Wt 128.6 lb

## 2018-04-25 DIAGNOSIS — R350 Frequency of micturition: Secondary | ICD-10-CM | POA: Diagnosis not present

## 2018-04-25 DIAGNOSIS — N3001 Acute cystitis with hematuria: Secondary | ICD-10-CM | POA: Diagnosis not present

## 2018-04-25 DIAGNOSIS — R319 Hematuria, unspecified: Secondary | ICD-10-CM

## 2018-04-25 LAB — URINALYSIS, COMPLETE
Bilirubin, UA: NEGATIVE
GLUCOSE, UA: NEGATIVE
KETONES UA: NEGATIVE
Leukocytes, UA: NEGATIVE
NITRITE UA: NEGATIVE
Protein, UA: NEGATIVE
SPEC GRAV UA: 1.02 (ref 1.005–1.030)
UUROB: 0.2 mg/dL (ref 0.2–1.0)
pH, UA: 5.5 (ref 5.0–7.5)

## 2018-04-25 LAB — MICROSCOPIC EXAMINATION: Renal Epithel, UA: NONE SEEN /hpf

## 2018-04-25 MED ORDER — NITROFURANTOIN MONOHYD MACRO 100 MG PO CAPS: 100.0000 mg | ORAL_CAPSULE | Freq: Two times a day (BID) | ORAL | 0 refills | Status: DC

## 2018-04-25 MED ORDER — CEPHALEXIN 500 MG PO CAPS: 500.0000 mg | ORAL_CAPSULE | Freq: Two times a day (BID) | ORAL | 0 refills | Status: DC

## 2018-04-25 NOTE — Progress Notes (Signed)
   Subjective:    Patient ID: Ruth Hopkins, female    DOB: 10/28/1965, 52 y.o.   MRN: [id number]  Chief Complaint  Patient presents with  . frequent urinating    Urinary Frequency   This is a recurrent problem. The current episode started in the past 7 days. The problem occurs intermittently. The problem has been waxing and waning. The pain is at a severity of 0/10. The patient is experiencing no pain. There has been no fever. Associated symptoms include frequency, nausea and urgency. Pertinent negatives include no discharge, flank pain, hematuria, hesitancy or vomiting. She has tried increased fluids for the symptoms. The treatment provided no relief.      Review of Systems  Gastrointestinal: Positive for nausea. Negative for vomiting.  Genitourinary: Positive for frequency and urgency. Negative for flank pain, hematuria and hesitancy.  All other systems reviewed and are negative.      Objective:   Physical Exam  Constitutional: She is oriented to person, place, and time. She appears well-developed and well-nourished. No distress.  HENT:  Head: Normocephalic and atraumatic.  Right Ear: External ear normal.  Left Ear: External ear normal.  Mouth/Throat: Oropharynx is clear and moist.  Eyes: Pupils are equal, round, and reactive to light.  Neck: Normal range of motion. Neck supple. No thyromegaly present.  Cardiovascular: Normal rate, regular rhythm, normal heart sounds and intact distal pulses.  No murmur heard. Pulmonary/Chest: Effort normal and breath sounds normal. No respiratory distress. She has no wheezes.  Abdominal: Soft. Bowel sounds are normal. She exhibits no distension. There is no tenderness.  Musculoskeletal: Normal range of motion. She exhibits no edema or tenderness.  Neurological: She is alert and oriented to person, place, and time. She has normal reflexes. No cranial nerve deficit.  Skin: Skin is warm and dry.  Psychiatric: She has a normal mood and affect. Her  behavior is normal. Judgment and thought content normal.  Vitals reviewed.    BP 98/69   Pulse 74   Temp 97.7 F (36.5 C) (Oral)   Ht 5\' 4"  (1.626 m)   Wt 128 lb 9.6 oz (58.3 kg)   BMI 22.07 kg/m      Assessment & Plan:  Ruth Hopkins comes in today with chief complaint of frequent urinating   Diagnosis and orders addressed:  1. Frequent urination - Urinalysis, Complete  2. Acute cystitis with hematuria Force fluids RTO prn Culture pending - Urine Culture - nitrofurantoin, macrocrystal-monohydrate, (MACROBID) 100 MG capsule; Take 1 capsule (100 mg total) by mouth 2 (two) times daily.  Dispense: 10 capsule; Refill: 0  3. Hematuria, unspecified type  Evelina Dun, FNP

## 2018-04-25 NOTE — Addendum Note (Signed)
Addended by: Evelina Dun A on: 04/25/2018 11:16 AM   Modules accepted: Orders

## 2018-04-25 NOTE — Patient Instructions (Signed)

## 2018-04-26 LAB — URINE CULTURE: Organism ID, Bacteria: NO GROWTH

## 2018-05-01 ENCOUNTER — Encounter: Payer: Self-pay | Admitting: Family Medicine

## 2018-05-01 ENCOUNTER — Other Ambulatory Visit: Payer: Self-pay | Admitting: Physician Assistant

## 2018-05-01 ENCOUNTER — Ambulatory Visit (INDEPENDENT_AMBULATORY_CARE_PROVIDER_SITE_OTHER): Payer: Medicare HMO | Admitting: Family Medicine

## 2018-05-01 VITALS — BP 108/75 | HR 72 | Temp 99.3°F | Ht 64.0 in | Wt 129.4 lb

## 2018-05-01 DIAGNOSIS — M797 Fibromyalgia: Secondary | ICD-10-CM | POA: Diagnosis not present

## 2018-05-01 DIAGNOSIS — S161XXA Strain of muscle, fascia and tendon at neck level, initial encounter: Secondary | ICD-10-CM

## 2018-05-01 MED ORDER — METHOCARBAMOL 750 MG PO TABS: ORAL_TABLET | ORAL | 0 refills | Status: DC

## 2018-05-01 MED ORDER — PREDNISONE 10 MG PO TABS: ORAL_TABLET | ORAL | 0 refills | Status: DC

## 2018-05-01 NOTE — Progress Notes (Signed)
Subjective:  Patient ID: Ruth Hopkins, female    DOB: 1965-10-05  Age: 52 y.o. MRN: [id number]  CC: Fibromyalgia (pt here today c/o fibromyalgia flare up and needs refill on her )   HPI Ruth Hopkins presents for aching in the posterior neck.  She traces a line from the nuchal ridge to the superior border of the trapezius along the left posterior neck.  This radiates into the left but not into the upper extremity.  She has a history of fibromyalgia.  It has not flared recently.  However pain started 3 days ago after she had done a large number of chores as part of her housework.  She usually gets relief from methocarbamol.  She did not have any available to her.  Depression screen [facility] 2/9 04/25/2018 02/20/2018 01/20/2018  Decreased Interest 0 1 1  Down, Depressed, Hopeless 0 1 1  PHQ - 2 Score 0 2 2  Altered sleeping - 1 2  Tired, decreased energy - 1 1  Change in appetite - 0 0  Feeling bad or failure about yourself  - 0 0  Trouble concentrating - 0 0  Moving slowly or fidgety/restless - 0 0  Suicidal thoughts - 0 0  PHQ-9 Score - 4 5    History Ruth Hopkins has a past medical history of Chronic fatigue, Depression, Fibromyalgia, IBS (irritable bowel syndrome), Migraine, and Thyroid disease.   She has a past surgical history that includes Foot surgery and Breast surgery (Right, 2007).   Her family history includes Asthma in her mother; Hypertension in her father and mother; Hypothyroidism in her mother; Raynaud syndrome in her mother.She reports that she has been smoking. She has never used smokeless tobacco. She reports that she does not drink alcohol or use drugs.    ROS Review of Systems  Constitutional: Negative.   HENT: Negative.   Eyes: Negative for visual disturbance.  Respiratory: Negative for shortness of breath.   Cardiovascular: Negative for chest pain.  Gastrointestinal: Negative for abdominal pain.  Musculoskeletal: Positive for arthralgias and myalgias.    Objective:  BP  108/75   Pulse 72   Temp 99.3 F (37.4 C) (Oral)   Ht 5\' 4"  (1.626 m)   Wt 129 lb 6 oz (58.7 kg)   BMI 22.21 kg/m   BP Readings from Last 3 Encounters:  05/01/18 108/75  04/25/18 98/69  02/20/18 97/67    Wt Readings from Last 3 Encounters:  05/01/18 129 lb 6 oz (58.7 kg)  04/25/18 128 lb 9.6 oz (58.3 kg)  02/20/18 127 lb 6.4 oz (57.8 kg)     Physical Exam  Constitutional: She is oriented to person, place, and time. She appears well-developed and well-nourished. No distress.  HENT:  Head: Normocephalic and atraumatic.  Eyes: Pupils are equal, round, and reactive to light. Conjunctivae and EOM are normal.  Neck: Normal range of motion. Neck supple.  Cardiovascular: Normal rate, regular rhythm and normal heart sounds.  No murmur heard. Pulmonary/Chest: Effort normal and breath sounds normal. No respiratory distress. She has no wheezes. She has no rales.  Abdominal: Soft. Bowel sounds are normal. She exhibits no distension. There is no tenderness.  Musculoskeletal: She exhibits tenderness (At the posterior neck as noted above.). She exhibits no edema.       Lumbar back: She exhibits decreased range of motion, tenderness and spasm. She exhibits no deformity and normal pulse.  Neurological: She is alert and oriented to person, place, and time. She has normal  reflexes.  Skin: Skin is warm and dry.  Psychiatric: She has a normal mood and affect. Her behavior is normal. Thought content normal.      Assessment & Plan:   Ruth Hopkins was seen today for fibromyalgia.  Diagnoses and all orders for this visit:  Strain of neck muscle, initial encounter  Fibromyalgia  Other orders -     methocarbamol (ROBAXIN) 750 MG tablet; TAKE 1/2 TO 2 TABLETS UP TO 4 TIMES DAILY -     predniSONE (DELTASONE) 10 MG tablet; Take 5 PO x 2 days, then 4 PO x 2 days, then 3 PO x 2days, then 2 PO x 2 days, then 1 PO x 2 days.       I have discontinued Gustavus Messing. Tamez's valACYclovir and cephALEXin. I am  also having her start on predniSONE. Additionally, I am having her maintain her clonazePAM, MINIVELLE, imipramine, Multiple Vitamins-Minerals (MULTIVITAMIN PO), acetaminophen, buPROPion, eletriptan, mometasone, diclofenac, levothyroxine, omeprazole, cycloSPORINE, and methocarbamol.  Allergies as of 05/01/2018      Reactions   Latex Itching, Rash   Codeine    Sulfa Antibiotics       Medication List        Accurate as of 05/01/18  7:28 PM. Always use your most recent med list.          acetaminophen 500 MG tablet Commonly known as:  TYLENOL Take by mouth.   buPROPion 300 MG 24 hr tablet Commonly known as:  WELLBUTRIN XL bupropion HCl XL 300 mg 24 hr tablet, extended release   clonazePAM 0.5 MG tablet Commonly known as:  KLONOPIN Take 0.5 mg by mouth 2 (two) times daily.   diclofenac 75 MG EC tablet Commonly known as:  VOLTAREN TAKE 1 TABLET BY MOUTH TWICE DAILY   eletriptan 40 MG tablet Commonly known as:  RELPAX Take by mouth.   imipramine 10 MG tablet Commonly known as:  TOFRANIL Take 30 mg by mouth at bedtime.   levothyroxine 50 MCG tablet Commonly known as:  SYNTHROID, LEVOTHROID TAKE 1 TABLET BY MOUTH ONCE DAILY BEFORE BREAKFAST   methocarbamol 750 MG tablet Commonly known as:  ROBAXIN TAKE 1/2 TO 2 TABLETS UP TO 4 TIMES DAILY   MINIVELLE 0.1 MG/24HR patch Generic drug:  estradiol Place 1 patch onto the skin 2 (two) times a week.   mometasone 50 MCG/ACT nasal spray Commonly known as:  NASONEX PLACE 2 SPRAYS INTO THE NOSE DAILY.   MULTIVITAMIN PO Take by mouth.   omeprazole 20 MG capsule Commonly known as:  PRILOSEC TAKE 1 CAPSULE BY MOUTH EVERY DAY   predniSONE 10 MG tablet Commonly known as:  DELTASONE Take 5 PO x 2 days, then 4 PO x 2 days, then 3 PO x 2days, then 2 PO x 2 days, then 1 PO x 2 days.   RESTASIS 0.05 % ophthalmic emulsion Generic drug:  cycloSPORINE Restasis 0.05 % eye drops in a dropperette  USE 1 DROP INTO BOTH EYES TWICE A  DAY        Follow-up: Return if symptoms worsen or fail to improve.  Claretta Fraise, M.D.

## 2018-05-01 NOTE — Telephone Encounter (Signed)
Last seen 04/25/18 

## 2018-05-24 ENCOUNTER — Telehealth: Payer: Self-pay | Admitting: Physician Assistant

## 2018-05-24 NOTE — Telephone Encounter (Signed)
Pt is getting ready to go on vacation on a Long trip since she was diagnosis  with fibromyalgia she would like to see if AJ could call her in some Vicodin incase pain gets bad riding on that long trip. State that she doesn't think tramadol will work  Equities trader

## 2018-05-24 NOTE — Telephone Encounter (Signed)
Please advise 

## 2018-05-25 NOTE — Telephone Encounter (Signed)
Leaving 05/30/18, she knows Glenard Haring is not here today. Routed to [place] again

## 2018-05-26 ENCOUNTER — Encounter: Payer: Self-pay | Admitting: Physician Assistant

## 2018-05-26 ENCOUNTER — Other Ambulatory Visit: Payer: Self-pay | Admitting: Physician Assistant

## 2018-05-26 ENCOUNTER — Ambulatory Visit: Payer: Medicare HMO | Admitting: Physician Assistant

## 2018-05-26 ENCOUNTER — Ambulatory Visit (INDEPENDENT_AMBULATORY_CARE_PROVIDER_SITE_OTHER): Payer: Medicare HMO | Admitting: Physician Assistant

## 2018-05-26 VITALS — BP 103/71 | HR 73 | Temp 97.6°F | Ht 64.0 in | Wt 128.6 lb

## 2018-05-26 DIAGNOSIS — G43901 Migraine, unspecified, not intractable, with status migrainosus: Secondary | ICD-10-CM | POA: Diagnosis not present

## 2018-05-26 DIAGNOSIS — F339 Major depressive disorder, recurrent, unspecified: Secondary | ICD-10-CM

## 2018-05-26 DIAGNOSIS — R69 Illness, unspecified: Secondary | ICD-10-CM | POA: Diagnosis not present

## 2018-05-26 DIAGNOSIS — M797 Fibromyalgia: Secondary | ICD-10-CM

## 2018-05-26 MED ORDER — ELETRIPTAN HYDROBROMIDE 40 MG PO TABS: 40.0000 mg | ORAL_TABLET | Freq: Once | ORAL | 2 refills | Status: DC

## 2018-05-26 MED ORDER — BUPROPION HCL ER (XL) 150 MG PO TB24: 150.0000 mg | ORAL_TABLET | Freq: Every day | ORAL | 5 refills | Status: DC

## 2018-05-26 MED ORDER — METHOCARBAMOL 500 MG PO TABS: 500.0000 mg | ORAL_TABLET | Freq: Four times a day (QID) | ORAL | 0 refills | Status: DC

## 2018-05-26 MED ORDER — HYDROCODONE-ACETAMINOPHEN 10-325 MG PO TABS: 2.0000 | ORAL_TABLET | ORAL | 0 refills | Status: DC | PRN

## 2018-05-26 NOTE — Telephone Encounter (Signed)
Has appointment today

## 2018-05-26 NOTE — Progress Notes (Signed)
BP 103/71   Pulse 73   Temp 97.6 F (36.4 C) (Oral)   Ht 5\' 4"  (1.626 m)   Wt 128 lb 9.6 oz (58.3 kg)   BMI 22.07 kg/m    Subjective:    Patient ID: Ruth Hopkins, female    DOB: 19-Jul-1966, 52 y.o.   MRN: [id number]  HPI: Ruth Hopkins is a 52 y.o. female presenting on 05/26/2018 for Fibromyalgia (going on trip and would like something for pain )  Aubry comes in today to discuss her chronic condition of fibromyalgia.  She has been doing quite well and has had a very well controlled in recent years.  She is planning a vacation and has not been on a long vacation in many years.  She is concerned she might have a little bit of a flareup.  She actually ran out of her pain medication because it was expired for a year.  She is just not been back in to get any.  But she knows that this may flare her up and she would rather be ahead of the game.  We have discussed that I can give her a 5-day supply of her medications and she would use that judiciously.  We have made a few other changes because her Wellbutrin seems to be keeping her awake at night or in the lower that to 150 mg.  And also renew her migraine medication.  Past Medical History:  Diagnosis Date  . Chronic fatigue   . Depression   . Fibromyalgia   . IBS (irritable bowel syndrome)   . Migraine   . Thyroid disease    Relevant past medical, surgical, family and social history reviewed and updated as indicated. Interim medical history since our last visit reviewed. Allergies and medications reviewed and updated. DATA REVIEWED: CHART IN EPIC  Family History reviewed for pertinent findings.  Review of Systems  Constitutional: Positive for activity change and appetite change.  HENT: Negative.   Eyes: Negative.   Respiratory: Negative.   Gastrointestinal: Negative.   Genitourinary: Negative.   Musculoskeletal: Positive for arthralgias, myalgias and neck pain.  Neurological: Positive for headaches.    Allergies as of 05/26/2018       Reactions   Latex Itching, Rash   Codeine    Sulfa Antibiotics       Medication List        Accurate as of 05/26/18  1:32 PM. Always use your most recent med list.          acetaminophen 500 MG tablet Commonly known as:  TYLENOL Take by mouth.   buPROPion 150 MG 24 hr tablet Commonly known as:  WELLBUTRIN XL Take 1 tablet (150 mg total) by mouth daily.   clonazePAM 0.5 MG tablet Commonly known as:  KLONOPIN Take 0.5 mg by mouth 2 (two) times daily.   diclofenac 75 MG EC tablet Commonly known as:  VOLTAREN TAKE 1 TABLET BY MOUTH TWICE DAILY   eletriptan 40 MG tablet Commonly known as:  RELPAX Take 1 tablet (40 mg total) by mouth once for 1 dose.   HYDROcodone-acetaminophen 10-325 MG tablet Commonly known as:  NORCO Take 2 tablets by mouth every 4 (four) hours as needed.   imipramine 10 MG tablet Commonly known as:  TOFRANIL Take 30 mg by mouth at bedtime.   levothyroxine 50 MCG tablet Commonly known as:  SYNTHROID, LEVOTHROID TAKE 1 TABLET BY MOUTH ONCE DAILY BEFORE BREAKFAST   methocarbamol 500 MG tablet  Commonly known as:  ROBAXIN Take 1 tablet (500 mg total) by mouth 4 (four) times daily.   MINIVELLE 0.1 MG/24HR patch Generic drug:  estradiol Place 1 patch onto the skin 2 (two) times a week.   mometasone 50 MCG/ACT nasal spray Commonly known as:  NASONEX PLACE 2 SPRAYS INTO THE NOSE DAILY.   MULTIVITAMIN PO Take by mouth.   omeprazole 20 MG capsule Commonly known as:  PRILOSEC TAKE 1 CAPSULE BY MOUTH EVERY DAY   RESTASIS 0.05 % ophthalmic emulsion Generic drug:  cycloSPORINE Restasis 0.05 % eye drops in a dropperette  USE 1 DROP INTO BOTH EYES TWICE A DAY          Objective:    BP 103/71   Pulse 73   Temp 97.6 F (36.4 C) (Oral)   Ht 5\' 4"  (1.626 m)   Wt 128 lb 9.6 oz (58.3 kg)   BMI 22.07 kg/m   Allergies  Allergen Reactions  . Latex Itching and Rash  . Codeine   . Sulfa Antibiotics     Wt Readings from Last 3  Encounters:  05/26/18 128 lb 9.6 oz (58.3 kg)  05/01/18 129 lb 6 oz (58.7 kg)  04/25/18 128 lb 9.6 oz (58.3 kg)    Physical Exam  Constitutional: She is oriented to person, place, and time. She appears well-developed and well-nourished.  HENT:  Head: Normocephalic and atraumatic.  Eyes: Pupils are equal, round, and reactive to light. Conjunctivae and EOM are normal.  Cardiovascular: Normal rate, regular rhythm, normal heart sounds and intact distal pulses.  Pulmonary/Chest: Effort normal and breath sounds normal.  Abdominal: Soft. Bowel sounds are normal.  Neurological: She is alert and oriented to person, place, and time. She has normal reflexes.  Skin: Skin is warm and dry. No rash noted.  Psychiatric: She has a normal mood and affect. Her behavior is normal. Judgment and thought content normal.        Assessment & Plan:   1. Depression, recurrent (HCC) - buPROPion (WELLBUTRIN XL) 150 MG 24 hr tablet; Take 1 tablet (150 mg total) by mouth daily.  Dispense: 30 tablet; Refill: 5  2. Fibromyalgia - HYDROcodone-acetaminophen (NORCO) 10-325 MG tablet; Take 2 tablets by mouth every 4 (four) hours as needed.  Dispense: 40 tablet; Refill: 0  3. Migraine with status migrainosus, not intractable, unspecified migraine type - eletriptan (RELPAX) 40 MG tablet; Take 1 tablet (40 mg total) by mouth once for 1 dose.  Dispense: 12 tablet; Refill: 2   Continue all other maintenance medications as listed above.  Follow up plan: Return in about 4 months (around 09/25/2018).  Educational handout given for [place] PA-C [facility] [address]  [place], [place] [address] [phone]   05/26/2018, 1:32 PM

## 2018-06-01 ENCOUNTER — Ambulatory Visit: Payer: Medicare HMO | Admitting: Orthotics

## 2018-06-01 DIAGNOSIS — M779 Enthesopathy, unspecified: Secondary | ICD-10-CM

## 2018-06-01 DIAGNOSIS — M79671 Pain in right foot: Secondary | ICD-10-CM

## 2018-06-01 DIAGNOSIS — G5761 Lesion of plantar nerve, right lower limb: Secondary | ICD-10-CM

## 2018-06-01 NOTE — Progress Notes (Signed)
F/o still not right..met pad needs to be thicker.

## 2018-06-12 ENCOUNTER — Ambulatory Visit: Payer: Medicare HMO | Admitting: *Deleted

## 2018-06-13 ENCOUNTER — Other Ambulatory Visit: Payer: Self-pay | Admitting: Physician Assistant

## 2018-06-15 ENCOUNTER — Other Ambulatory Visit: Payer: Self-pay | Admitting: Family Medicine

## 2018-06-16 NOTE — Telephone Encounter (Signed)
Needs to be seen for this.  Please have her schedule f/u w PCP

## 2018-06-16 NOTE — Telephone Encounter (Signed)
Patient aware and will call back to schedule

## 2018-06-16 NOTE — Telephone Encounter (Signed)
Last seen 05/26/18  Ruth Hopkins PCP

## 2018-06-17 ENCOUNTER — Other Ambulatory Visit: Payer: Self-pay | Admitting: Physician Assistant

## 2018-07-04 DIAGNOSIS — Z01419 Encounter for gynecological examination (general) (routine) without abnormal findings: Secondary | ICD-10-CM | POA: Diagnosis not present

## 2018-07-04 DIAGNOSIS — Z6822 Body mass index (BMI) 22.0-22.9, adult: Secondary | ICD-10-CM | POA: Diagnosis not present

## 2018-07-17 ENCOUNTER — Ambulatory Visit (INDEPENDENT_AMBULATORY_CARE_PROVIDER_SITE_OTHER): Payer: Medicare HMO

## 2018-07-17 ENCOUNTER — Ambulatory Visit: Payer: Medicare HMO | Admitting: Orthotics

## 2018-07-17 ENCOUNTER — Encounter: Payer: Self-pay | Admitting: Podiatry

## 2018-07-17 ENCOUNTER — Ambulatory Visit (INDEPENDENT_AMBULATORY_CARE_PROVIDER_SITE_OTHER): Payer: Medicare HMO | Admitting: Podiatry

## 2018-07-17 DIAGNOSIS — G5761 Lesion of plantar nerve, right lower limb: Secondary | ICD-10-CM

## 2018-07-17 DIAGNOSIS — M79671 Pain in right foot: Secondary | ICD-10-CM

## 2018-07-17 DIAGNOSIS — M779 Enthesopathy, unspecified: Secondary | ICD-10-CM

## 2018-07-17 MED ORDER — TRIAMCINOLONE ACETONIDE 10 MG/ML IJ SUSP
10.0000 mg | Freq: Once | INTRAMUSCULAR | Status: AC
  Administered 2018-07-17: 10 mg

## 2018-07-17 NOTE — Progress Notes (Signed)
Subjective:   Patient ID: Ruth Hopkins, female   DOB: 52 y.o.   MRN: [id number]   HPI Patient presents stating she has a lot of pain in the sinus tarsi of both feet and wants them worked on is its been effective   ROS      Objective:  Physical Exam  Neurovascular status intact with exquisite discomfort sinus tarsi bilateral     Assessment:  Acute sinus tarsitis bilateral     Plan:  Injected the sinus tarsi bilateral 3 mg Kenalog 5 mg Xylocaine modified orthotics and reappoint for routine care

## 2018-07-17 NOTE — Progress Notes (Signed)
Patient picked up adjusted f/o 

## 2018-07-26 ENCOUNTER — Telehealth: Payer: Self-pay | Admitting: Podiatry

## 2018-07-26 NOTE — Telephone Encounter (Signed)
Pt left message stating she needed an appt with Liliane Channel for Monday if possible orthotics not working asked me to call her back.   I returned call and left message [facility] is full for Monday as of now but is avail Tuesday or Thursday of next week and to call to schedule an appt.

## 2018-07-28 ENCOUNTER — Telehealth: Payer: Self-pay | Admitting: Physician Assistant

## 2018-07-28 NOTE — Telephone Encounter (Signed)
Per [facility] - they will change the day supply to 6 to see if this decreases price to $9.98 as she has paid in the past.  She will still receive the 12 tablets as prescribed.  Patient notified via voicemail.

## 2018-07-28 NOTE — Telephone Encounter (Signed)
Phone call to patient to see how many relpax she has gotten in the past for a 6 day supply.  Left voicemail to return call.

## 2018-08-02 NOTE — Telephone Encounter (Signed)
Pt called back and is scheduled as your last pt next Tuesday morning. But she wanted to let you know they are not correct still. She thinks they are the wrong Tish Men orthotics. The pad on both pair are off centered. She wants them to be restarted and to be made just like the ones from 11/2016.

## 2018-08-02 NOTE — Telephone Encounter (Signed)
We have been through this before.  [facility] DOES NOT have another Milda Smart. (I verified this again today) We have remade them and used one as a template for the remake.  I will call her, but at this point after so many remakes (5 this year), I just don't think we can get it to her satisfaction.  I did call [facility] and he said for her to call him at [phone] and she can make an appointment to see him in [place].  Ps:  I did call and told spouse to have her call me on my cell.

## 2018-08-05 DIAGNOSIS — R69 Illness, unspecified: Secondary | ICD-10-CM | POA: Diagnosis not present

## 2018-08-08 ENCOUNTER — Encounter: Payer: Medicare HMO | Admitting: Orthotics

## 2018-08-09 ENCOUNTER — Ambulatory Visit (INDEPENDENT_AMBULATORY_CARE_PROVIDER_SITE_OTHER): Payer: Medicare HMO | Admitting: Orthotics

## 2018-08-09 DIAGNOSIS — M79671 Pain in right foot: Secondary | ICD-10-CM

## 2018-08-09 DIAGNOSIS — M779 Enthesopathy, unspecified: Secondary | ICD-10-CM

## 2018-08-09 DIAGNOSIS — G5761 Lesion of plantar nerve, right lower limb: Secondary | ICD-10-CM

## 2018-08-16 ENCOUNTER — Ambulatory Visit (INDEPENDENT_AMBULATORY_CARE_PROVIDER_SITE_OTHER): Payer: Medicare HMO | Admitting: Physician Assistant

## 2018-08-16 ENCOUNTER — Encounter: Payer: Self-pay | Admitting: Physician Assistant

## 2018-08-16 VITALS — BP 110/74 | HR 76 | Temp 99.3°F | Ht 64.0 in | Wt 130.8 lb

## 2018-08-16 DIAGNOSIS — G43901 Migraine, unspecified, not intractable, with status migrainosus: Secondary | ICD-10-CM | POA: Diagnosis not present

## 2018-08-16 DIAGNOSIS — R300 Dysuria: Secondary | ICD-10-CM | POA: Diagnosis not present

## 2018-08-16 DIAGNOSIS — N3001 Acute cystitis with hematuria: Secondary | ICD-10-CM | POA: Insufficient documentation

## 2018-08-16 LAB — URINALYSIS, COMPLETE
Bilirubin, UA: NEGATIVE
GLUCOSE, UA: NEGATIVE
KETONES UA: NEGATIVE
Leukocytes, UA: NEGATIVE
NITRITE UA: NEGATIVE
PROTEIN UA: NEGATIVE
UUROB: 0.2 mg/dL (ref 0.2–1.0)
pH, UA: 5.5 (ref 5.0–7.5)

## 2018-08-16 LAB — MICROSCOPIC EXAMINATION: Renal Epithel, UA: NONE SEEN /hpf

## 2018-08-16 MED ORDER — ELETRIPTAN HYDROBROMIDE 40 MG PO TABS: 40.0000 mg | ORAL_TABLET | ORAL | 11 refills | Status: DC | PRN

## 2018-08-16 MED ORDER — CEPHALEXIN 500 MG PO CAPS: 500.0000 mg | ORAL_CAPSULE | Freq: Three times a day (TID) | ORAL | 0 refills | Status: DC

## 2018-08-16 MED ORDER — NITROFURANTOIN MONOHYD MACRO 100 MG PO CAPS: 100.0000 mg | ORAL_CAPSULE | Freq: Two times a day (BID) | ORAL | 0 refills | Status: DC

## 2018-08-16 NOTE — Patient Instructions (Signed)
In a few days you may receive a survey in the mail or online from [employer] regarding your visit with us today. Please take a moment to fill this out. Your feedback is very important to our whole office. It can help us better understand your needs as well as improve your experience and satisfaction. Thank you for taking your time to complete it. We care about you.  Starr Urias, PA-C  

## 2018-08-16 NOTE — Progress Notes (Signed)
BP 110/74   Pulse 76   Temp 99.3 F (37.4 C) (Oral)   Ht 5\' 4"  (1.626 m)   Wt 130 lb 12.8 oz (59.3 kg)   BMI 22.45 kg/m    Subjective:    Patient ID: Ruth Hopkins, female    DOB: [birth year], 52 y.o.   MRN: [id number]  HPI: Ruth Hopkins is a 52 y.o. female presenting on 08/16/2018 for Urinary Tract Infection  This patient has had several days of dysuria, frequency and nocturia. There is also pain over the bladder in the suprapubic region, no back pain. Denies leakage or hematuria.  Denies fever or chills. No pain in flank area.  She also comes in to discuss getting refills on her migraine medication.  She does take Relpax.  We are going to change the wording so that she is able to get it for a full normal co-pay.  Past Medical History:  Diagnosis Date  . Chronic fatigue   . Depression   . Fibromyalgia   . IBS (irritable bowel syndrome)   . Migraine   . Thyroid disease    Relevant past medical, surgical, family and social history reviewed and updated as indicated. Interim medical history since our last visit reviewed. Allergies and medications reviewed and updated. DATA REVIEWED: CHART IN EPIC  Family History reviewed for pertinent findings.  Review of Systems  Constitutional: Negative.   HENT: Negative.   Eyes: Negative.   Respiratory: Negative.   Gastrointestinal: Negative.   Genitourinary: Positive for difficulty urinating, dysuria and urgency. Negative for flank pain.    Allergies as of 08/16/2018      Reactions   Latex Itching, Rash   Codeine    Sulfa Antibiotics       Medication List        Accurate as of 08/16/18 10:24 AM. Always use your most recent med list.          acetaminophen 500 MG tablet Commonly known as:  TYLENOL Take by mouth.   buPROPion 300 MG 24 hr tablet Commonly known as:  WELLBUTRIN XL   cephALEXin 500 MG capsule Commonly known as:  KEFLEX Take 1 capsule (500 mg total) by mouth 3 (three) times daily.   clonazePAM 0.5 MG  tablet Commonly known as:  KLONOPIN Take 0.5 mg by mouth 2 (two) times daily.   diclofenac 75 MG EC tablet Commonly known as:  VOLTAREN TAKE 1 TABLET BY MOUTH TWICE DAILY   eletriptan 40 MG tablet Commonly known as:  RELPAX Take 1 tablet (40 mg total) by mouth every 2 (two) hours as needed for up to 6 days for migraine or headache.   HYDROcodone-acetaminophen 10-325 MG tablet Commonly known as:  NORCO Take 2 tablets by mouth every 4 (four) hours as needed.   imipramine 10 MG tablet Commonly known as:  TOFRANIL Take 30 mg by mouth at bedtime.   levothyroxine 50 MCG tablet Commonly known as:  SYNTHROID, LEVOTHROID TAKE 1 TABLET BY MOUTH ONCE DAILY BEFORE BREAKFAST   methocarbamol 500 MG tablet Commonly known as:  ROBAXIN Take 1 tablet (500 mg total) by mouth 4 (four) times daily.   MINIVELLE 0.1 MG/24HR patch Generic drug:  estradiol Place 1 patch onto the skin 2 (two) times a week.   mometasone 50 MCG/ACT nasal spray Commonly known as:  NASONEX PLACE 2 SPRAYS INTO THE NOSE DAILY.   MULTIVITAMIN PO Take by mouth.   nitrofurantoin (macrocrystal-monohydrate) 100 MG capsule Commonly known as:  MACROBID Take  1 capsule (100 mg total) by mouth 2 (two) times daily. 1 po BId   omeprazole 20 MG capsule Commonly known as:  PRILOSEC TAKE 1 CAPSULE BY MOUTH EVERY DAY   RESTASIS 0.05 % ophthalmic emulsion Generic drug:  cycloSPORINE Restasis 0.05 % eye drops in a dropperette  USE 1 DROP INTO BOTH EYES TWICE A DAY          Objective:    BP 110/74   Pulse 76   Temp 99.3 F (37.4 C) (Oral)   Ht 5\' 4"  (1.626 m)   Wt 130 lb 12.8 oz (59.3 kg)   BMI 22.45 kg/m   Allergies  Allergen Reactions  . Latex Itching and Rash  . Codeine   . Sulfa Antibiotics     Wt Readings from Last 3 Encounters:  08/16/18 130 lb 12.8 oz (59.3 kg)  05/26/18 128 lb 9.6 oz (58.3 kg)  05/01/18 129 lb 6 oz (58.7 kg)    Physical Exam  Constitutional: She is oriented to person, place, and  time. She appears well-developed and well-nourished.  HENT:  Head: Normocephalic and atraumatic.  Eyes: Pupils are equal, round, and reactive to light. Conjunctivae are normal.  Cardiovascular: Normal rate, regular rhythm, normal heart sounds and intact distal pulses.  Pulmonary/Chest: Effort normal and breath sounds normal.  Abdominal: Soft. Bowel sounds are normal. She exhibits no distension and no mass. There is tenderness in the suprapubic area. There is no rebound, no guarding and no CVA tenderness.  Neurological: She is alert and oriented to person, place, and time. She has normal reflexes.  Skin: Skin is warm and dry. No rash noted.  Psychiatric: She has a normal mood and affect. Her behavior is normal. Judgment and thought content normal.    Results for orders placed or performed in visit on 04/25/18  Urine Culture  Result Value Ref Range   Urine Culture, Routine Final report    Organism ID, Bacteria No growth   Microscopic Examination  Result Value Ref Range   WBC, UA 0-5 0 - 5 /hpf   RBC, UA 3-10 (A) 0 - 2 /hpf   Epithelial Cells (non renal) >10 (A) 0 - 10 /hpf   Renal Epithel, UA None seen None seen /hpf   Bacteria, UA Few None seen/Few  Urinalysis, Complete  Result Value Ref Range   Specific Gravity, UA 1.020 1.005 - 1.030   pH, UA 5.5 5.0 - 7.5   Color, UA Yellow Yellow   Appearance Ur Clear Clear   Leukocytes, UA Negative Negative   Protein, UA Negative Negative/Trace   Glucose, UA Negative Negative   Ketones, UA Negative Negative   RBC, UA 2+ (A) Negative   Bilirubin, UA Negative Negative   Urobilinogen, Ur 0.2 0.2 - 1.0 mg/dL   Nitrite, UA Negative Negative   Microscopic Examination See below:       Assessment & Plan:   1. Dysuria - Urine Culture - Urinalysis, Complete  2. Acute cystitis with hematuria - cephALEXin (KEFLEX) 500 MG capsule; Take 1 capsule (500 mg total) by mouth 3 (three) times daily.  Dispense: 30 capsule; Refill: 0  Patient would  like to see if Macrobid is any cheaper at [facility].  A prescription has been sent there.  [facility] it was [facility]47.  3. Migraine with status migrainosus, not intractable, unspecified migraine type - eletriptan (RELPAX) 40 MG tablet; Take 1 tablet (40 mg total) by mouth every 2 (  two) hours as needed for up to 6 days for migraine or headache.  Dispense: 12 tablet; Refill: 11   Continue all other maintenance medications as listed above.  Follow up plan: Return in about 6 months (around 02/14/2019) for recheck.  Educational handout given for [place] PA-C [facility] [address]  [place], [place] [address] [phone]   08/16/2018, 10:24 AM

## 2018-08-18 ENCOUNTER — Telehealth: Payer: Self-pay | Admitting: Physician Assistant

## 2018-08-18 MED ORDER — FLUCONAZOLE 150 MG PO TABS: 150.0000 mg | ORAL_TABLET | ORAL | 0 refills | Status: DC | PRN

## 2018-08-18 NOTE — Telephone Encounter (Signed)
[facility]  PT was recently put on antibiotic and now is getting a yeast infection pt is requesting one time pill for yeast infection with couple refills because it can take a few doses before it works pt states.

## 2018-08-18 NOTE — Telephone Encounter (Signed)
Diflucan Prescription sent to pharmacy   

## 2018-08-20 LAB — URINE CULTURE

## 2018-08-21 ENCOUNTER — Other Ambulatory Visit: Payer: Self-pay | Admitting: Family Medicine

## 2018-08-21 MED ORDER — AMOXICILLIN-POT CLAVULANATE 875-125 MG PO TABS: 1.0000 | ORAL_TABLET | Freq: Two times a day (BID) | ORAL | 0 refills | Status: AC

## 2018-08-23 DIAGNOSIS — Z1239 Encounter for other screening for malignant neoplasm of breast: Secondary | ICD-10-CM | POA: Diagnosis not present

## 2018-08-23 DIAGNOSIS — R922 Inconclusive mammogram: Secondary | ICD-10-CM | POA: Diagnosis not present

## 2018-08-23 DIAGNOSIS — Z86000 Personal history of in-situ neoplasm of breast: Secondary | ICD-10-CM | POA: Diagnosis not present

## 2018-08-23 DIAGNOSIS — Z9189 Other specified personal risk factors, not elsewhere classified: Secondary | ICD-10-CM | POA: Diagnosis not present

## 2018-08-24 ENCOUNTER — Encounter: Payer: Medicare HMO | Admitting: Orthotics

## 2018-08-27 NOTE — Progress Notes (Signed)
Sending back to [facility] to once again try to correct to make exactly like 2018 pair.

## 2018-08-28 ENCOUNTER — Telehealth: Payer: Self-pay | Admitting: Physician Assistant

## 2018-08-28 NOTE — Telephone Encounter (Signed)
What symptoms do you have? Nausea and going to the bathroom. Was seen for UTI 1-2- weeks ago  How long have you been sick? 2 weeks  Have you been seen for this problem? yes  If your provider decides to give you a prescription, which pharmacy would you [facility] for [facility] [place]   Patient informed that this information will be sent to the clinical staff for review and that they should receive a follow up call.

## 2018-08-29 ENCOUNTER — Ambulatory Visit: Payer: Medicare HMO | Admitting: Orthotics

## 2018-08-29 DIAGNOSIS — R69 Illness, unspecified: Secondary | ICD-10-CM | POA: Diagnosis not present

## 2018-08-29 DIAGNOSIS — M779 Enthesopathy, unspecified: Secondary | ICD-10-CM

## 2018-08-29 DIAGNOSIS — M79671 Pain in right foot: Secondary | ICD-10-CM

## 2018-08-29 DIAGNOSIS — M778 Other enthesopathies, not elsewhere classified: Secondary | ICD-10-CM

## 2018-08-29 DIAGNOSIS — G5761 Lesion of plantar nerve, right lower limb: Secondary | ICD-10-CM

## 2018-08-29 NOTE — Telephone Encounter (Signed)
Does she feel this is a gastroenteritis type infection or continuation of the urinary tract infection?

## 2018-08-31 ENCOUNTER — Telehealth: Payer: Self-pay | Admitting: Physician Assistant

## 2018-08-31 NOTE — Telephone Encounter (Signed)
Attempted to contact pt without return call in over 3 days, will close encounter. 

## 2018-09-07 NOTE — Telephone Encounter (Signed)
C-

## 2018-09-07 NOTE — Telephone Encounter (Signed)
Called pt, lmom if still having urinary symptoms to give [place] a call asap.

## 2018-09-17 ENCOUNTER — Other Ambulatory Visit: Payer: Self-pay | Admitting: Physician Assistant

## 2018-09-18 ENCOUNTER — Ambulatory Visit (INDEPENDENT_AMBULATORY_CARE_PROVIDER_SITE_OTHER): Payer: Medicare HMO | Admitting: Family Medicine

## 2018-09-18 ENCOUNTER — Encounter: Payer: Self-pay | Admitting: Family Medicine

## 2018-09-18 VITALS — BP 102/71 | HR 76 | Temp 97.5°F | Ht 64.0 in | Wt 131.4 lb

## 2018-09-18 DIAGNOSIS — R350 Frequency of micturition: Secondary | ICD-10-CM | POA: Diagnosis not present

## 2018-09-18 DIAGNOSIS — N3090 Cystitis, unspecified without hematuria: Secondary | ICD-10-CM | POA: Diagnosis not present

## 2018-09-18 LAB — URINALYSIS, COMPLETE
Bilirubin, UA: NEGATIVE
Glucose, UA: NEGATIVE
KETONES UA: NEGATIVE
Leukocytes, UA: NEGATIVE
Nitrite, UA: NEGATIVE
Protein, UA: NEGATIVE
Specific Gravity, UA: 1.02 (ref 1.005–1.030)
UUROB: 0.2 mg/dL (ref 0.2–1.0)
pH, UA: 7 (ref 5.0–7.5)

## 2018-09-18 LAB — MICROSCOPIC EXAMINATION
Epithelial Cells (non renal): >10 /hpf — AB (ref 0–10)
Renal Epithel, UA: NONE SEEN /hpf

## 2018-09-18 MED ORDER — CIPROFLOXACIN HCL 500 MG PO TABS: 500.0000 mg | ORAL_TABLET | Freq: Two times a day (BID) | ORAL | 0 refills | Status: DC

## 2018-09-18 NOTE — Progress Notes (Signed)
Chief Complaint  Patient presents with  . requent urinating    HPI  Patient presents today for frequent urination yesterday without dysuria.  She has some intermittent urgency as well.  She has been treated for this fairly frequently.  Most recently about 3 weeks ago.  No fever chills or sweats.  Some nausea noted.  However she has nausea with her fibromyalgia anyway.  No back pain in the flanks or lower abdominal discomfort either.  PMH: Smoking status noted ROS: Per HPI  Objective: BP 102/71   Pulse 76   Temp (!) 97.5 F (36.4 C) (Oral)   Ht 5\' 4"  (1.626 m)   Wt 131 lb 6.4 oz (59.6 kg)   BMI 22.55 kg/m  Gen: NAD, alert, cooperative with exam HEENT: NCAT, EOMI, PERRL CV: RRR, good S1/S2, no murmur Resp: CTABL, no wheezes, non-labored Abd: SNTND, BS present, no guarding or organomegaly Ext: No edema, warm Neuro: Alert and oriented, No gross deficits  Assessment and plan:  1. Frequent urination   2. Cystitis     Meds ordered this encounter  Medications  . ciprofloxacin (CIPRO) 500 MG tablet    Sig: Take 1 tablet (500 mg total) by mouth 2 (two) times daily.    Dispense:  14 tablet    Refill:  0    Orders Placed This Encounter  Procedures  . Urine Culture  . Urinalysis, Complete    Follow up as needed.  Ruth Fraise, MD

## 2018-09-19 LAB — URINE CULTURE

## 2018-09-22 ENCOUNTER — Telehealth: Payer: Self-pay | Admitting: Physician Assistant

## 2018-09-22 NOTE — Telephone Encounter (Signed)
Pt aware culture didn't show any significant growth, no UTI.

## 2018-09-25 ENCOUNTER — Ambulatory Visit: Payer: Medicare HMO | Admitting: Physician Assistant

## 2018-09-25 DIAGNOSIS — R69 Illness, unspecified: Secondary | ICD-10-CM | POA: Diagnosis not present

## 2018-09-26 ENCOUNTER — Encounter: Payer: Self-pay | Admitting: Physician Assistant

## 2018-09-26 ENCOUNTER — Ambulatory Visit (INDEPENDENT_AMBULATORY_CARE_PROVIDER_SITE_OTHER): Payer: Medicare HMO | Admitting: Physician Assistant

## 2018-09-26 VITALS — BP 107/72 | HR 62 | Ht 64.0 in | Wt 130.0 lb

## 2018-09-26 DIAGNOSIS — G43901 Migraine, unspecified, not intractable, with status migrainosus: Secondary | ICD-10-CM

## 2018-09-26 DIAGNOSIS — M797 Fibromyalgia: Secondary | ICD-10-CM | POA: Diagnosis not present

## 2018-09-26 DIAGNOSIS — L659 Nonscarring hair loss, unspecified: Secondary | ICD-10-CM

## 2018-09-26 DIAGNOSIS — Z0000 Encounter for general adult medical examination without abnormal findings: Secondary | ICD-10-CM

## 2018-09-26 DIAGNOSIS — E039 Hypothyroidism, unspecified: Secondary | ICD-10-CM

## 2018-09-26 MED ORDER — DICLOFENAC SODIUM 75 MG PO TBEC: 75.0000 mg | DELAYED_RELEASE_TABLET | Freq: Two times a day (BID) | ORAL | 3 refills | Status: DC

## 2018-09-26 MED ORDER — GABAPENTIN 100 MG PO CAPS: 100.0000 mg | ORAL_CAPSULE | Freq: Two times a day (BID) | ORAL | 5 refills | Status: DC

## 2018-09-26 MED ORDER — OMEPRAZOLE 20 MG PO CPDR: DELAYED_RELEASE_CAPSULE | ORAL | 11 refills | Status: DC

## 2018-09-26 MED ORDER — BUPROPION HCL ER (XL) 300 MG PO TB24: 300.0000 mg | ORAL_TABLET | Freq: Every day | ORAL | 11 refills | Status: DC

## 2018-09-26 MED ORDER — HYDROCODONE-ACETAMINOPHEN 10-325 MG PO TABS: 2.0000 | ORAL_TABLET | ORAL | 0 refills | Status: DC | PRN

## 2018-09-26 MED ORDER — IMIPRAMINE HCL 10 MG PO TABS: 30.0000 mg | ORAL_TABLET | Freq: Every day | ORAL | 11 refills | Status: AC

## 2018-09-26 MED ORDER — PREDNISONE 10 MG (21) PO TBPK: ORAL_TABLET | ORAL | 0 refills | Status: DC

## 2018-09-26 MED ORDER — MOMETASONE FUROATE 50 MCG/ACT NA SUSP: 2.0000 | Freq: Every day | NASAL | 11 refills | Status: DC

## 2018-09-26 MED ORDER — METHOCARBAMOL 500 MG PO TABS: 500.0000 mg | ORAL_TABLET | Freq: Four times a day (QID) | ORAL | 0 refills | Status: DC

## 2018-09-27 NOTE — Progress Notes (Signed)
Sent f/o back to [facility] for further adjustments.

## 2018-09-28 NOTE — Progress Notes (Signed)
BP 107/72   Pulse 62   Ht 5' 4" (1.626 m)   Wt 130 lb (59 kg)   BMI 22.31 kg/m    Subjective:    Patient ID: [name], female    DOB: 31-Mar-1966, 53 y.o.   MRN: [id number]  HPI: Ruth Hopkins is a 53 y.o. female presenting on 09/26/2018 for Depression (4 month )  This patient comes in for periodic recheck on medications and conditions including fibromyalgia, migraine, hearing loss, hypothyroidism.  She states that this larger area of hair loss.  Her beautician noticed it.  She states she has not had any type of injury there.  She has not had labs since..   All medications are reviewed today. There are no reports of any problems with the medications. All of the medical conditions are reviewed and updated.  Lab work is reviewed and will be ordered as medically necessary. There are no new problems reported with today's visit.   Past Medical History:  Diagnosis Date  . Chronic fatigue   . Depression   . Fibromyalgia   . IBS (irritable bowel syndrome)   . Migraine   . Thyroid disease    Relevant past medical, surgical, family and social history reviewed and updated as indicated. Interim medical history since our last visit reviewed. Allergies and medications reviewed and updated. DATA REVIEWED: CHART IN EPIC  Family History reviewed for pertinent findings.  Review of Systems  Constitutional: Negative.  Negative for activity change, fatigue and fever.  HENT: Negative.   Eyes: Negative.   Respiratory: Negative.  Negative for cough.   Cardiovascular: Negative.  Negative for chest pain.  Gastrointestinal: Negative.  Negative for abdominal pain.  Endocrine: Negative.   Genitourinary: Negative.  Negative for dysuria.  Musculoskeletal: Positive for arthralgias.  Skin: Negative.   Neurological: Positive for headaches.    Allergies as of 09/26/2018      Reactions   Latex Itching, Rash   Codeine    Sulfa Antibiotics       Medication List       Accurate as of September 26, 2018  11:59 PM. Always use your most recent med list.        acetaminophen 500 MG tablet Commonly known as:  TYLENOL Take by mouth.   buPROPion 300 MG 24 hr tablet Commonly known as:  WELLBUTRIN XL Take 1 tablet (300 mg total) by mouth daily.   clonazePAM 0.5 MG tablet Commonly known as:  KLONOPIN Take 0.5 mg by mouth 2 (two) times daily.   diclofenac 75 MG EC tablet Commonly known as:  VOLTAREN Take 1 tablet (75 mg total) by mouth 2 (two) times daily.   eletriptan 40 MG tablet Commonly known as:  RELPAX Take 1 tablet (40 mg total) by mouth every 2 (two) hours as needed for up to 6 days for migraine or headache.   gabapentin 100 MG capsule Commonly known as:  NEURONTIN Take 1 capsule (100 mg total) by mouth 2 (two) times daily.   HYDROcodone-acetaminophen 10-325 MG tablet Commonly known as:  NORCO Take 2 tablets by mouth every 4 (four) hours as needed.   imipramine 10 MG tablet Commonly known as:  TOFRANIL Take 3 tablets (30 mg total) by mouth at bedtime.   levothyroxine 50 MCG tablet Commonly known as:  SYNTHROID, LEVOTHROID TAKE 1 TABLET BY MOUTH ONCE DAILY BEFORE BREAKFAST   methocarbamol 500 MG tablet Commonly known as:  ROBAXIN Take 1 tablet (500 mg total) by mouth 4 (  four) times daily.   MINIVELLE 0.1 MG/24HR patch Generic drug:  estradiol Place 1 patch onto the skin 2 (two) times a week.   mometasone 50 MCG/ACT nasal spray Commonly known as:  NASONEX Place 2 sprays into the nose daily.   MULTIVITAMIN PO Take by mouth.   omeprazole 20 MG capsule Commonly known as:  PRILOSEC TAKE 1 CAPSULE BY MOUTH EVERY DAY   predniSONE 10 MG (21) Tbpk tablet Commonly known as:  STERAPRED UNI-PAK 21 TAB As directed x 6 days   RESTASIS 0.05 % ophthalmic emulsion Generic drug:  cycloSPORINE Restasis 0.05 % eye drops in a dropperette  USE 1 DROP INTO BOTH EYES TWICE A DAY          Objective:    BP 107/72   Pulse 62   Ht 5' 4" (1.626 m)   Wt 130 lb (59 kg)    BMI 22.31 kg/m   Allergies  Allergen Reactions  . Latex Itching and Rash  . Codeine   . Sulfa Antibiotics     Wt Readings from Last 3 Encounters:  09/26/18 130 lb (59 kg)  09/18/18 131 lb 6.4 oz (59.6 kg)  08/16/18 130 lb 12.8 oz (59.3 kg)    Physical Exam Constitutional:      Appearance: She is well-developed.  HENT:     Head: Normocephalic and atraumatic.     Right Ear: Tympanic membrane, ear canal and external ear normal.     Left Ear: Tympanic membrane, ear canal and external ear normal.     Nose: Nose normal. No rhinorrhea.     Mouth/Throat:     Pharynx: No oropharyngeal exudate or posterior oropharyngeal erythema.  Eyes:     Conjunctiva/sclera: Conjunctivae normal.     Pupils: Pupils are equal, round, and reactive to light.  Neck:     Musculoskeletal: Normal range of motion and neck supple.  Cardiovascular:     Rate and Rhythm: Normal rate and regular rhythm.     Heart sounds: Normal heart sounds.  Pulmonary:     Effort: Pulmonary effort is normal.     Breath sounds: Normal breath sounds.  Abdominal:     General: Bowel sounds are normal.     Palpations: Abdomen is soft.  Skin:    General: Skin is warm and dry.     Findings: No rash.  Neurological:     Mental Status: She is alert and oriented to person, place, and time.     Deep Tendon Reflexes: Reflexes are normal and symmetric.  Psychiatric:        Behavior: Behavior normal.        Thought Content: Thought content normal.        Judgment: Judgment normal.         Assessment & Plan:   1. Fibromyalgia - HYDROcodone-acetaminophen (NORCO) 10-325 MG tablet; Take 2 tablets by mouth every 4 (four) hours as needed.  Dispense: 40 tablet; Refill: 0 - imipramine (TOFRANIL) 10 MG tablet; Take 3 tablets (30 mg total) by mouth at bedtime.  Dispense: 90 tablet; Refill: 11 - methocarbamol (ROBAXIN) 500 MG tablet; Take 1 tablet (500 mg total) by mouth 4 (four) times daily.  Dispense: 120 tablet; Refill: 0  2. Well  adult exam - CBC with Differential/Platelet; Future - CMP14+EGFR; Future - Lipid panel; Future - Thyroid Panel With TSH; Future  3. Migraine with status migrainosus, not intractable, unspecified migraine type  4. Hair loss Perform labs  5. Acquired hypothyroidism Perform labs  Continue all other maintenance medications as listed above.  Follow up plan: Return in about 4 months (around 01/25/2019).  Educational handout given for [place] PA-C [place] [address]  [place], [place] [address] [phone]   09/28/2018, 8:59 PM

## 2018-10-12 DIAGNOSIS — D0510 Intraductal carcinoma in situ of unspecified breast: Secondary | ICD-10-CM | POA: Insufficient documentation

## 2018-10-12 DIAGNOSIS — N9089 Other specified noninflammatory disorders of vulva and perineum: Secondary | ICD-10-CM | POA: Diagnosis not present

## 2018-10-12 DIAGNOSIS — A6000 Herpesviral infection of urogenital system, unspecified: Secondary | ICD-10-CM | POA: Insufficient documentation

## 2018-10-12 DIAGNOSIS — R69 Illness, unspecified: Secondary | ICD-10-CM | POA: Diagnosis not present

## 2018-10-13 ENCOUNTER — Telehealth: Payer: Self-pay | Admitting: Physician Assistant

## 2018-10-13 ENCOUNTER — Encounter: Payer: Self-pay | Admitting: Family Medicine

## 2018-10-13 ENCOUNTER — Ambulatory Visit (INDEPENDENT_AMBULATORY_CARE_PROVIDER_SITE_OTHER): Payer: Managed Care, Other (non HMO) | Admitting: Family Medicine

## 2018-10-13 VITALS — BP 126/84 | HR 78 | Temp 97.7°F | Ht 64.0 in | Wt 133.2 lb

## 2018-10-13 DIAGNOSIS — M5431 Sciatica, right side: Secondary | ICD-10-CM | POA: Diagnosis not present

## 2018-10-13 MED ORDER — PREDNISONE 10 MG (21) PO TBPK: ORAL_TABLET | ORAL | 0 refills | Status: DC
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
## 2018-10-13 NOTE — Telephone Encounter (Signed)
Patient states she would like to start over with her prednisone pack due to the amount of pain she is in. Per Dr. Warrick Parisian okay to refill rx. Patient aware and verbalizes understanding.

## 2018-10-17 ENCOUNTER — Ambulatory Visit (INDEPENDENT_AMBULATORY_CARE_PROVIDER_SITE_OTHER): Payer: Managed Care, Other (non HMO) | Admitting: Physician Assistant

## 2018-10-17 ENCOUNTER — Encounter: Payer: Self-pay | Admitting: Physician Assistant

## 2018-10-17 VITALS — BP 124/77 | HR 85 | Temp 99.2°F | Ht 64.0 in | Wt 133.0 lb

## 2018-10-17 DIAGNOSIS — M545 Low back pain, unspecified: Secondary | ICD-10-CM

## 2018-10-17 DIAGNOSIS — M797 Fibromyalgia: Secondary | ICD-10-CM | POA: Diagnosis not present

## 2018-10-17 DIAGNOSIS — M79604 Pain in right leg: Secondary | ICD-10-CM

## 2018-10-17 MED ORDER — PREDNISONE 10 MG PO TABS: 10.0000 mg | ORAL_TABLET | Freq: Every day | ORAL | 0 refills | Status: DC

## 2018-10-17 NOTE — Patient Instructions (Signed)

## 2018-10-18 ENCOUNTER — Telehealth: Payer: Self-pay | Admitting: Physician Assistant

## 2018-10-18 ENCOUNTER — Other Ambulatory Visit: Payer: Self-pay | Admitting: Physician Assistant

## 2018-10-18 DIAGNOSIS — M797 Fibromyalgia: Secondary | ICD-10-CM

## 2018-10-18 MED ORDER — HYDROCODONE-ACETAMINOPHEN 10-325 MG PO TABS: 2.0000 | ORAL_TABLET | ORAL | 0 refills | Status: DC | PRN

## 2018-10-18 NOTE — Telephone Encounter (Signed)
Refill medication

## 2018-10-18 NOTE — Telephone Encounter (Signed)
sent 

## 2018-10-19 ENCOUNTER — Other Ambulatory Visit: Payer: Self-pay | Admitting: Physician Assistant

## 2018-10-19 DIAGNOSIS — M9904 Segmental and somatic dysfunction of sacral region: Secondary | ICD-10-CM | POA: Diagnosis not present

## 2018-10-19 DIAGNOSIS — M9905 Segmental and somatic dysfunction of pelvic region: Secondary | ICD-10-CM | POA: Diagnosis not present

## 2018-10-19 DIAGNOSIS — M5431 Sciatica, right side: Secondary | ICD-10-CM | POA: Diagnosis not present

## 2018-10-19 DIAGNOSIS — M9903 Segmental and somatic dysfunction of lumbar region: Secondary | ICD-10-CM | POA: Diagnosis not present

## 2018-10-19 DIAGNOSIS — M797 Fibromyalgia: Secondary | ICD-10-CM

## 2018-10-19 MED ORDER — HYDROCODONE-ACETAMINOPHEN 10-325 MG PO TABS: 2.0000 | ORAL_TABLET | ORAL | 0 refills | Status: DC | PRN

## 2018-10-19 NOTE — Telephone Encounter (Signed)
LM- rx has been sent  
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
## 2018-10-19 NOTE — Telephone Encounter (Signed)
sent 

## 2018-10-20 DIAGNOSIS — Z1231 Encounter for screening mammogram for malignant neoplasm of breast: Secondary | ICD-10-CM | POA: Diagnosis not present

## 2018-10-20 DIAGNOSIS — Z9189 Other specified personal risk factors, not elsewhere classified: Secondary | ICD-10-CM | POA: Diagnosis not present

## 2018-10-20 DIAGNOSIS — R922 Inconclusive mammogram: Secondary | ICD-10-CM | POA: Diagnosis not present

## 2018-10-20 DIAGNOSIS — Z86000 Personal history of in-situ neoplasm of breast: Secondary | ICD-10-CM | POA: Diagnosis not present

## 2018-10-24 DIAGNOSIS — M5416 Radiculopathy, lumbar region: Secondary | ICD-10-CM | POA: Diagnosis not present

## 2018-10-24 DIAGNOSIS — M545 Low back pain: Secondary | ICD-10-CM | POA: Diagnosis not present

## 2018-11-07 ENCOUNTER — Ambulatory Visit (INDEPENDENT_AMBULATORY_CARE_PROVIDER_SITE_OTHER): Payer: Medicare HMO | Admitting: Physician Assistant

## 2018-11-07 ENCOUNTER — Encounter: Payer: Self-pay | Admitting: Physician Assistant

## 2018-11-07 ENCOUNTER — Other Ambulatory Visit: Payer: Self-pay | Admitting: Physician Assistant

## 2018-11-07 VITALS — BP 103/69 | HR 78 | Temp 97.8°F | Ht 64.0 in | Wt 131.2 lb

## 2018-11-07 DIAGNOSIS — M5431 Sciatica, right side: Secondary | ICD-10-CM

## 2018-11-07 DIAGNOSIS — M545 Low back pain, unspecified: Secondary | ICD-10-CM

## 2018-11-07 DIAGNOSIS — R300 Dysuria: Secondary | ICD-10-CM | POA: Diagnosis not present

## 2018-11-07 DIAGNOSIS — M79604 Pain in right leg: Secondary | ICD-10-CM

## 2018-11-07 DIAGNOSIS — M797 Fibromyalgia: Secondary | ICD-10-CM

## 2018-11-07 LAB — URINALYSIS, COMPLETE
Bilirubin, UA: NEGATIVE
Glucose, UA: NEGATIVE
Ketones, UA: NEGATIVE
Leukocytes, UA: NEGATIVE
Nitrite, UA: NEGATIVE
PROTEIN UA: NEGATIVE
SPEC GRAV UA: 1.02 (ref 1.005–1.030)
Urobilinogen, Ur: 0.2 mg/dL (ref 0.2–1.0)
pH, UA: 6 (ref 5.0–7.5)

## 2018-11-07 LAB — MICROSCOPIC EXAMINATION
Epithelial Cells (non renal): >10 /hpf — AB (ref 0–10)
Renal Epithel, UA: NONE SEEN /hpf

## 2018-11-07 MED ORDER — METHOCARBAMOL 500 MG PO TABS: 500.0000 mg | ORAL_TABLET | Freq: Four times a day (QID) | ORAL | 0 refills | Status: DC

## 2018-11-07 MED ORDER — HYDROCODONE-ACETAMINOPHEN 10-325 MG PO TABS: 2.0000 | ORAL_TABLET | ORAL | 0 refills | Status: DC | PRN

## 2018-11-07 MED ORDER — METHYLPREDNISOLONE ACETATE 80 MG/ML IJ SUSP
80.0000 mg | Freq: Once | INTRAMUSCULAR | Status: AC
  Administered 2018-11-07: 80 mg via INTRAMUSCULAR

## 2018-11-07 MED ORDER — CIPROFLOXACIN HCL 250 MG PO TABS: 250.0000 mg | ORAL_TABLET | Freq: Two times a day (BID) | ORAL | 0 refills | Status: DC

## 2018-11-08 ENCOUNTER — Telehealth: Payer: Self-pay | Admitting: Physician Assistant

## 2018-11-08 MED ORDER — CIPROFLOXACIN HCL 250 MG PO TABS: 250.0000 mg | ORAL_TABLET | Freq: Two times a day (BID) | ORAL | 0 refills | Status: DC

## 2018-11-08 NOTE — Telephone Encounter (Signed)
Patient aware rx sent to [facility] and rx [facility] at [facility]

## 2018-11-09 DIAGNOSIS — M5431 Sciatica, right side: Secondary | ICD-10-CM | POA: Insufficient documentation

## 2018-11-09 DIAGNOSIS — M79604 Pain in right leg: Secondary | ICD-10-CM | POA: Insufficient documentation

## 2018-11-09 DIAGNOSIS — M545 Low back pain, unspecified: Secondary | ICD-10-CM | POA: Insufficient documentation

## 2018-11-09 NOTE — Progress Notes (Signed)
BP 103/69   Pulse 78   Temp 97.8 F (36.6 C) (Oral)   Ht 5\' 4"  (1.626 m)   Wt 131 lb 3.2 oz (59.5 kg)   BMI 22.52 kg/m    Subjective:    Patient ID: Ruth Hopkins, female    DOB: 06/10/1966, 53 y.o.   MRN: [id number]  HPI: Ruth Hopkins is a 53 y.o. female presenting on 11/07/2018 for Back Pain and Leg Pain  This patient comes in for periodic recheck on medications and conditions including fibromyalgia, lumbar pain with right leg radiation, right sciatic nerve and dysuria She reports that there is very little improvement in her fibromyalgia and lumbar pain with right leg pain. She has had visits with Dr. Nelva Bush and they may do an injection in the near future..   All medications are reviewed today. There are no reports of any problems with the medications. All of the medical conditions are reviewed and updated.  Lab work is reviewed and will be ordered as medically necessary. There are no new problems reported with today's visit.   Past Medical History:  Diagnosis Date  . Chronic fatigue   . Depression   . Fibromyalgia   . IBS (irritable bowel syndrome)   . Migraine   . Thyroid disease    Relevant past medical, surgical, family and social history reviewed and updated as indicated. Interim medical history since our last visit reviewed. Allergies and medications reviewed and updated. DATA REVIEWED: CHART IN EPIC  Family History reviewed for pertinent findings.  Review of Systems  Allergies as of 11/07/2018      Reactions   Latex Itching, Rash   Codeine    Sulfa Antibiotics       Medication List       Accurate as of November 07, 2018 11:59 PM. Always use your most recent med list.        acetaminophen 500 MG tablet Commonly known as:  TYLENOL Take by mouth.   buPROPion 300 MG 24 hr tablet Commonly known as:  WELLBUTRIN XL Take 1 tablet (300 mg total) by mouth daily.   ciprofloxacin 250 MG tablet Commonly known as:  CIPRO Take 1 tablet (250 mg total) by mouth 2  (two) times daily.   clonazePAM 0.5 MG tablet Commonly known as:  KLONOPIN Take 0.5 mg by mouth 2 (two) times daily.   diclofenac 75 MG EC tablet Commonly known as:  VOLTAREN Take 1 tablet (75 mg total) by mouth 2 (two) times daily.   eletriptan 40 MG tablet Commonly known as:  RELPAX Take 1 tablet (40 mg total) by mouth every 2 (two) hours as needed for up to 6 days for migraine or headache.   gabapentin 100 MG capsule Commonly known as:  NEURONTIN Take 1 capsule (100 mg total) by mouth 2 (two) times daily.   HYDROcodone-acetaminophen 10-325 MG tablet Commonly known as:  NORCO Take 2 tablets by mouth every 4 (four) hours as needed.   imipramine 10 MG tablet Commonly known as:  TOFRANIL Take 3 tablets (30 mg total) by mouth at bedtime.   levothyroxine 50 MCG tablet Commonly known as:  SYNTHROID, LEVOTHROID TAKE 1 TABLET BY MOUTH ONCE DAILY BEFORE BREAKFAST   methocarbamol 500 MG tablet Commonly known as:  ROBAXIN Take 1 tablet (500 mg total) by mouth 4 (four) times daily.   MINIVELLE 0.1 MG/24HR patch Generic drug:  estradiol Place 1 patch onto the skin 2 (two) times a week.   mometasone 50  MCG/ACT nasal spray Commonly known as:  NASONEX Place 2 sprays into the nose daily.   MULTIVITAMIN PO Take by mouth.   omeprazole 20 MG capsule Commonly known as:  PRILOSEC TAKE 1 CAPSULE BY MOUTH EVERY DAY   predniSONE 10 MG tablet Commonly known as:  DELTASONE Take 1 tablet (10 mg total) by mouth daily with breakfast.   RESTASIS 0.05 % ophthalmic emulsion Generic drug:  cycloSPORINE Restasis 0.05 % eye drops in a dropperette  USE 1 DROP INTO BOTH EYES TWICE A DAY   valACYclovir 500 MG tablet Commonly known as:  VALTREX          Objective:    BP 103/69   Pulse 78   Temp 97.8 F (36.6 C) (Oral)   Ht 5\' 4"  (1.626 m)   Wt 131 lb 3.2 oz (59.5 kg)   BMI 22.52 kg/m   Allergies  Allergen Reactions  . Latex Itching and Rash  . Codeine   . Sulfa Antibiotics       Wt Readings from Last 3 Encounters:  11/07/18 131 lb 3.2 oz (59.5 kg)  10/17/18 133 lb (60.3 kg)  10/13/18 133 lb 3.2 oz (60.4 kg)    Physical Exam  Results for orders placed or performed in visit on 11/07/18  Microscopic Examination  Result Value Ref Range   WBC, UA 0-5 0 - 5 /hpf   RBC, UA 11-30 (A) 0 - 2 /hpf   Epithelial Cells (non renal) >10 (A) 0 - 10 /hpf   Renal Epithel, UA None seen None seen /hpf   Bacteria, UA Moderate (A) None seen/Few  Urinalysis, Complete  Result Value Ref Range   Specific Gravity, UA 1.020 1.005 - 1.030   pH, UA 6.0 5.0 - 7.5   Color, UA Yellow Yellow   Appearance Ur Clear Clear   Leukocytes, UA Negative Negative   Protein, UA Negative Negative/Trace   Glucose, UA Negative Negative   Ketones, UA Negative Negative   RBC, UA 1+ (A) Negative   Bilirubin, UA Negative Negative   Urobilinogen, Ur 0.2 0.2 - 1.0 mg/dL   Nitrite, UA Negative Negative   Microscopic Examination See below:       Assessment & Plan:   1. Fibromyalgia - methylPREDNISolone acetate (DEPO-MEDROL) injection 80 mg - HYDROcodone-acetaminophen (NORCO) 10-325 MG tablet; Take 2 tablets by mouth every 4 (four) hours as needed.  Dispense: 40 tablet; Refill: 0 - methocarbamol (ROBAXIN) 500 MG tablet; Take 1 tablet (500 mg total) by mouth 4 (four) times daily.  Dispense: 120 tablet; Refill: 0  2. Lumbar pain with radiation down right leg - methylPREDNISolone acetate (DEPO-MEDROL) injection 80 mg  3. Right sciatic nerve pain - methylPREDNISolone acetate (DEPO-MEDROL) injection 80 mg  4. Dysuria - Urinalysis, Complete - Microscopic Examination   Continue all other maintenance medications as listed above.  Follow up plan: Return in about 4 weeks (around 12/05/2018) for recheck.  Educational handout given for [place] PA-C [place] [address]  [place], [place] [address] [phone]   11/09/2018, 2:36 PM

## 2018-11-14 ENCOUNTER — Ambulatory Visit: Payer: Managed Care, Other (non HMO) | Admitting: Physician Assistant

## 2018-11-20 ENCOUNTER — Telehealth: Payer: Self-pay | Admitting: Podiatry

## 2018-11-20 NOTE — Telephone Encounter (Signed)
Pt called and left a message stating has talked to Ever from [facility] and he wants pt to come to [facility] to there facility to see him directly to get the orthotics correct   I returned call and pt stated Ever was to call and get the ok from [place] for her to go there. Pt states she is trying to see how she can get there but is going to have to wait until she gets her sciatic pain under control. She said what has been happening is there are confused about what needs to be corrected. Pt said Ever told her if she could not come there to mail a pair directly to him with [employer] Ever on the box.   I advised pt that Liliane Channel is out of the office for the next week and a half but to please let us know when she is going to [place].

## 2018-11-21 ENCOUNTER — Telehealth: Payer: Self-pay | Admitting: Physician Assistant

## 2018-11-21 NOTE — Telephone Encounter (Signed)
Pt aware Methocarbamol was sent to [facility]

## 2018-11-21 NOTE — Telephone Encounter (Signed)
LMOVM to find out which medication needs to be corrected

## 2018-11-24 DIAGNOSIS — M5137 Other intervertebral disc degeneration, lumbosacral region: Secondary | ICD-10-CM | POA: Diagnosis not present

## 2018-11-29 ENCOUNTER — Other Ambulatory Visit: Payer: Self-pay

## 2018-11-29 ENCOUNTER — Other Ambulatory Visit: Payer: Medicare HMO

## 2018-11-29 DIAGNOSIS — R5383 Other fatigue: Secondary | ICD-10-CM | POA: Diagnosis not present

## 2018-11-29 DIAGNOSIS — Z0000 Encounter for general adult medical examination without abnormal findings: Secondary | ICD-10-CM | POA: Diagnosis not present

## 2018-11-29 DIAGNOSIS — R69 Illness, unspecified: Secondary | ICD-10-CM | POA: Diagnosis not present

## 2018-11-29 DIAGNOSIS — E039 Hypothyroidism, unspecified: Secondary | ICD-10-CM | POA: Diagnosis not present

## 2018-11-30 LAB — CBC WITH DIFFERENTIAL/PLATELET
Basophils Absolute: 0 10*3/uL (ref 0.0–0.2)
Basos: 1 %
EOS (ABSOLUTE): 0.1 10*3/uL (ref 0.0–0.4)
Eos: 2 %
Hematocrit: 40.6 % (ref 34.0–46.6)
Hemoglobin: 13.5 g/dL (ref 11.1–15.9)
IMMATURE GRANULOCYTES: 0 %
Immature Grans (Abs): 0 10*3/uL (ref 0.0–0.1)
Lymphocytes Absolute: 1.5 10*3/uL (ref 0.7–3.1)
Lymphs: 30 %
MCH: 29.2 pg (ref 26.6–33.0)
MCHC: 33.3 g/dL (ref 31.5–35.7)
MCV: 88 fL (ref 79–97)
Monocytes Absolute: 0.4 10*3/uL (ref 0.1–0.9)
Monocytes: 8 %
Neutrophils Absolute: 3 10*3/uL (ref 1.4–7.0)
Neutrophils: 59 %
Platelets: 191 10*3/uL (ref 150–450)
RBC: 4.62 x10E6/uL (ref 3.77–5.28)
RDW: 11.9 % (ref 11.7–15.4)
WBC: 5 10*3/uL (ref 3.4–10.8)

## 2018-11-30 LAB — CMP14+EGFR
ALT: 17 IU/L (ref 0–32)
AST: 14 IU/L (ref 0–40)
Albumin/Globulin Ratio: 1.9 (ref 1.2–2.2)
Albumin: 4.3 g/dL (ref 3.8–4.9)
Alkaline Phosphatase: 51 IU/L (ref 39–117)
BUN/Creatinine Ratio: 33 — ABNORMAL HIGH (ref 9–23)
BUN: 24 mg/dL (ref 6–24)
Bilirubin Total: 0.3 mg/dL (ref 0.0–1.2)
CHLORIDE: 102 mmol/L (ref 96–106)
CO2: 23 mmol/L (ref 20–29)
Calcium: 9.2 mg/dL (ref 8.7–10.2)
Creatinine, Ser: 0.72 mg/dL (ref 0.57–1.00)
GFR calc Af Amer: 111 mL/min/{1.73_m2} (ref >59)
GFR calc non Af Amer: 96 mL/min/{1.73_m2} (ref >59)
GLUCOSE: 90 mg/dL (ref 65–99)
Globulin, Total: 2.3 g/dL (ref 1.5–4.5)
Potassium: 4.3 mmol/L (ref 3.5–5.2)
Sodium: 140 mmol/L (ref 134–144)
Total Protein: 6.6 g/dL (ref 6.0–8.5)

## 2018-11-30 LAB — LIPID PANEL
Chol/HDL Ratio: 3 ratio (ref 0.0–4.4)
Cholesterol, Total: 189 mg/dL (ref 100–199)
HDL: 64 mg/dL (ref >39)
LDL Calculated: 107 mg/dL — ABNORMAL HIGH (ref 0–99)
Triglycerides: 88 mg/dL (ref 0–149)
VLDL Cholesterol Cal: 18 mg/dL (ref 5–40)

## 2018-11-30 LAB — THYROID PANEL WITH TSH
Free Thyroxine Index: 2.2 (ref 1.2–4.9)
T3 Uptake Ratio: 30 % (ref 24–39)
T4 TOTAL: 7.4 ug/dL (ref 4.5–12.0)
TSH: 1.52 u[IU]/mL (ref 0.450–4.500)

## 2018-12-06 ENCOUNTER — Other Ambulatory Visit: Payer: Self-pay | Admitting: Physician Assistant

## 2018-12-08 ENCOUNTER — Ambulatory Visit: Payer: Medicare HMO | Admitting: Physician Assistant

## 2018-12-11 ENCOUNTER — Telehealth: Payer: Medicare HMO | Admitting: Family Medicine

## 2018-12-13 ENCOUNTER — Other Ambulatory Visit: Payer: Self-pay | Admitting: Physician Assistant

## 2018-12-18 ENCOUNTER — Telehealth: Payer: Self-pay | Admitting: Physician Assistant

## 2018-12-18 NOTE — Telephone Encounter (Signed)
Pt wanted to know if she should stop taking her diclofenac since she has read to stop taking ibuprofen or to not take it if you think you have COVID-19. Advised pt at this time we are not advising pt to stop routine medications and this can cause her underlying condition to flare up. Also advised pt that there are no true studies proving ibuprofen to cause increased illness with COVID-19. Pt voiced understanding.

## 2019-01-18 ENCOUNTER — Encounter: Payer: Self-pay | Admitting: Podiatry

## 2019-01-18 ENCOUNTER — Ambulatory Visit: Payer: Medicare HMO | Admitting: Podiatry

## 2019-01-18 ENCOUNTER — Other Ambulatory Visit: Payer: Self-pay

## 2019-01-18 VITALS — Temp 97.2°F

## 2019-01-18 DIAGNOSIS — M7752 Other enthesopathy of left foot: Secondary | ICD-10-CM | POA: Diagnosis not present

## 2019-01-18 DIAGNOSIS — M779 Enthesopathy, unspecified: Secondary | ICD-10-CM

## 2019-01-18 DIAGNOSIS — M7751 Other enthesopathy of right foot: Secondary | ICD-10-CM | POA: Diagnosis not present

## 2019-01-18 MED ORDER — TRIAMCINOLONE ACETONIDE 10 MG/ML IJ SUSP
10.0000 mg | Freq: Once | INTRAMUSCULAR | Status: AC
  Administered 2019-01-18: 10 mg

## 2019-01-18 NOTE — Progress Notes (Signed)
Subjective:   Patient ID: Ruth Hopkins, female   DOB: 53 y.o.   MRN: [id number]   HPI Patient presents with significant discomfort in the sinus tarsi bilateral states is did well for about 4-1/2 to 5 months   ROS      Objective:  Physical Exam  Acute sinus tarsitis bilateral with inflammation fluid buildup and pain with palpation     Assessment:  Sinus tarsitis bilateral     Plan:  Sterile prep and injected the sinus tarsi bilateral 3 mg Kenalog 5 mg Xylocaine applied sterile dressings and reappoint to recheck

## 2019-01-22 ENCOUNTER — Other Ambulatory Visit: Payer: Self-pay

## 2019-01-23 ENCOUNTER — Ambulatory Visit (INDEPENDENT_AMBULATORY_CARE_PROVIDER_SITE_OTHER): Payer: Medicare HMO | Admitting: Physician Assistant

## 2019-01-23 ENCOUNTER — Encounter: Payer: Self-pay | Admitting: Physician Assistant

## 2019-01-23 VITALS — BP 103/75 | HR 72 | Temp 98.2°F | Ht 64.0 in | Wt 130.6 lb

## 2019-01-23 DIAGNOSIS — E039 Hypothyroidism, unspecified: Secondary | ICD-10-CM

## 2019-01-23 DIAGNOSIS — M79604 Pain in right leg: Secondary | ICD-10-CM

## 2019-01-23 DIAGNOSIS — M545 Low back pain, unspecified: Secondary | ICD-10-CM

## 2019-01-23 DIAGNOSIS — G5761 Lesion of plantar nerve, right lower limb: Secondary | ICD-10-CM | POA: Diagnosis not present

## 2019-01-23 MED ORDER — TIZANIDINE HCL 4 MG PO TABS: 4.0000 mg | ORAL_TABLET | Freq: Four times a day (QID) | ORAL | 2 refills | Status: DC | PRN

## 2019-01-23 MED ORDER — LEVOTHYROXINE SODIUM 50 MCG PO TABS: ORAL_TABLET | ORAL | 3 refills | Status: DC

## 2019-01-23 MED ORDER — OMEPRAZOLE 20 MG PO CPDR: 20.0000 mg | DELAYED_RELEASE_CAPSULE | Freq: Every day | ORAL | 3 refills | Status: DC

## 2019-01-24 ENCOUNTER — Encounter: Payer: Self-pay | Admitting: Physician Assistant

## 2019-01-24 NOTE — Progress Notes (Signed)
BP 103/75    Pulse 72    Temp 98.2 F (36.8 C) (Oral)    Ht _0  (1.626 m)    Wt 130 lb 9.6 oz (59.2 kg)    BMI 22.42 kg/m    Subjective:    Patient ID: Ruth Hopkins, female    DOB: 02/28/1966, 53 y.o.   MRN: [id number]  HPI: Ruth Hopkins is a 53 y.o. female presenting on 01/23/2019 for Fibromyalgia (6 month ) and Medication Refill  This patient comes in for periodic recheck on her chronic medical conditions that do include hypothyroidism, Morton's neuroma, lumbar pain, fibromyalgia.  She states that overall she has been doing fairly well.  The last flareup of fibromyalgia has improved.  She was able to cut back on the muscle relaxants.  But in the past couple days she has had a little bit of a flareup from over stretching.  Still under the care of podiatry for her Morton's neuroma.  She states that it is under fairly good control right now.  She does need some labs to have things evaluated and check her thyroid.  She does not have any other issues at this time.  Past Medical History:  Diagnosis Date   Chronic fatigue    Depression    Fibromyalgia    IBS (irritable bowel syndrome)    Migraine    Thyroid disease    Relevant past medical, surgical, family and social history reviewed and updated as indicated. Interim medical history since our last visit reviewed. Allergies and medications reviewed and updated. DATA REVIEWED: CHART IN EPIC  Family History reviewed for pertinent findings.  Review of Systems  Constitutional: Negative.   HENT: Negative.   Eyes: Negative.   Respiratory: Negative.   Gastrointestinal: Negative.   Genitourinary: Negative.   Musculoskeletal: Positive for arthralgias, back pain and myalgias.    Allergies as of 01/23/2019      Reactions   Latex Itching, Rash   Codeine    Sulfa Antibiotics       Medication List       Accurate as of Jan 23, 2019 11:59 PM. If you have any questions, ask your nurse or doctor.        STOP taking these  medications   ciprofloxacin 250 MG tablet Commonly known as:  Cipro Stopped by:  Terald Sleeper, PA-C   methocarbamol 500 MG tablet Commonly known as:  ROBAXIN Stopped by:  Terald Sleeper, PA-C     TAKE these medications   acetaminophen 500 MG tablet Commonly known as:  TYLENOL Take by mouth.   buPROPion 300 MG 24 hr tablet Commonly known as:  WELLBUTRIN XL Take 1 tablet (300 mg total) by mouth daily.   clonazePAM 0.5 MG tablet Commonly known as:  KLONOPIN Take 0.5 mg by mouth 2 (two) times daily.   diclofenac 75 MG EC tablet Commonly known as:  VOLTAREN Take 1 tablet (75 mg total) by mouth 2 (two) times daily.   gabapentin 100 MG capsule Commonly known as:  NEURONTIN Take 1 capsule (100 mg total) by mouth 2 (two) times daily.   imipramine 10 MG tablet Commonly known as:  TOFRANIL Take 3 tablets (30 mg total) by mouth at bedtime.   levothyroxine 50 MCG tablet Commonly known as:  SYNTHROID TAKE 1 TABLET BY MOUTH ONCE DAILY BEFORE BREAKFAST   Minivelle 0.1 MG/24HR patch Generic drug:  estradiol Place 1 patch onto the skin 2 (two) times a week.  mometasone 50 MCG/ACT nasal spray Commonly known as:  NASONEX Place 2 sprays into the nose daily.   MULTIVITAMIN PO Take by mouth.   omeprazole 20 MG capsule Commonly known as:  PRILOSEC Take 1 capsule (20 mg total) by mouth daily. What changed:  how much to take Changed by:  Terald Sleeper, PA-C   Restasis 0.05 % ophthalmic emulsion Generic drug:  cycloSPORINE Restasis 0.05 % eye drops in a dropperette  USE 1 DROP INTO BOTH EYES TWICE A DAY   tiZANidine 4 MG tablet Commonly known as:  Zanaflex Take 1 tablet (4 mg total) by mouth every 6 (six) hours as needed for muscle spasms. Started by:  Terald Sleeper, PA-C   valACYclovir 500 MG tablet Commonly known as:  VALTREX          Objective:    BP 103/75    Pulse 72    Temp 98.2 F (36.8 C) (Oral)    Ht _0  (1.626 m)    Wt 130 lb 9.6 oz (59.2 kg)    BMI 22.42  kg/m   Allergies  Allergen Reactions   Latex Itching and Rash   Codeine    Sulfa Antibiotics     Wt Readings from Last 3 Encounters:  01/23/19 130 lb 9.6 oz (59.2 kg)  11/07/18 131 lb 3.2 oz (59.5 kg)  10/17/18 133 lb (60.3 kg)    Physical Exam Constitutional:      Appearance: She is well-developed.  HENT:     Head: Normocephalic and atraumatic.  Eyes:     Conjunctiva/sclera: Conjunctivae normal.     Pupils: Pupils are equal, round, and reactive to light.  Cardiovascular:     Rate and Rhythm: Normal rate and regular rhythm.     Heart sounds: Normal heart sounds.  Pulmonary:     Effort: Pulmonary effort is normal.     Breath sounds: Normal breath sounds.  Abdominal:     General: Bowel sounds are normal.     Palpations: Abdomen is soft.  Skin:    General: Skin is warm and dry.     Findings: No rash.  Neurological:     Mental Status: She is alert and oriented to person, place, and time.     Deep Tendon Reflexes: Reflexes are normal and symmetric.  Psychiatric:        Behavior: Behavior normal.        Thought Content: Thought content normal.        Judgment: Judgment normal.     Results for orders placed or performed in visit on 11/29/18  Thyroid Panel With TSH  Result Value Ref Range   TSH 1.520 0.450 - 4.500 uIU/mL   T4, Total 7.4 4.5 - 12.0 ug/dL   T3 Uptake Ratio 30 24 - 39 %   Free Thyroxine Index 2.2 1.2 - 4.9  Lipid panel  Result Value Ref Range   Cholesterol, Total 189 100 - 199 mg/dL   Triglycerides 88 0 - 149 mg/dL   HDL 64 >39 mg/dL   VLDL Cholesterol Cal 18 5 - 40 mg/dL   LDL Calculated 107 (H) 0 - 99 mg/dL   Chol/HDL Ratio 3.0 0.0 - 4.4 ratio  CMP14+EGFR  Result Value Ref Range   Glucose 90 65 - 99 mg/dL   BUN 24 6 - 24 mg/dL   Creatinine, Ser 0.72 0.57 - 1.00 mg/dL   GFR calc non Af Amer 96 >59 mL/min/1.73   GFR calc Af Amer 111 >59  mL/min/1.73   BUN/Creatinine Ratio 33 (H) 9 - 23   Sodium 140 134 - 144 mmol/L   Potassium 4.3 3.5 - 5.2  mmol/L   Chloride 102 96 - 106 mmol/L   CO2 23 20 - 29 mmol/L   Calcium 9.2 8.7 - 10.2 mg/dL   Total Protein 6.6 6.0 - 8.5 g/dL   Albumin 4.3 3.8 - 4.9 g/dL   Globulin, Total 2.3 1.5 - 4.5 g/dL   Albumin/Globulin Ratio 1.9 1.2 - 2.2   Bilirubin Total 0.3 0.0 - 1.2 mg/dL   Alkaline Phosphatase 51 39 - 117 IU/L   AST 14 0 - 40 IU/L   ALT 17 0 - 32 IU/L  CBC with Differential/Platelet  Result Value Ref Range   WBC 5.0 3.4 - 10.8 x10E3/uL   RBC 4.62 3.77 - 5.28 x10E6/uL   Hemoglobin 13.5 11.1 - 15.9 g/dL   Hematocrit 40.6 34.0 - 46.6 %   MCV 88 79 - 97 fL   MCH 29.2 26.6 - 33.0 pg   MCHC 33.3 31.5 - 35.7 g/dL   RDW 11.9 11.7 - 15.4 %   Platelets 191 150 - 450 x10E3/uL   Neutrophils 59 Not Estab. %   Lymphs 30 Not Estab. %   Monocytes 8 Not Estab. %   Eos 2 Not Estab. %   Basos 1 Not Estab. %   Neutrophils Absolute 3.0 1.4 - 7.0 x10E3/uL   Lymphocytes Absolute 1.5 0.7 - 3.1 x10E3/uL   Monocytes Absolute 0.4 0.1 - 0.9 x10E3/uL   EOS (ABSOLUTE) 0.1 0.0 - 0.4 x10E3/uL   Basophils Absolute 0.0 0.0 - 0.2 x10E3/uL   Immature Granulocytes 0 Not Estab. %   Immature Grans (Abs) 0.0 0.0 - 0.1 x10E3/uL      Assessment & Plan:   1. Acquired hypothyroidism - levothyroxine (SYNTHROID) 50 MCG tablet; TAKE 1 TABLET BY MOUTH ONCE DAILY BEFORE BREAKFAST  Dispense: 90 tablet; Refill: 3  2. Morton's neuroma of right foot Continue treatment with posiatry  3. Lumbar pain with radiation down right leg - tiZANidine (ZANAFLEX) 4 MG tablet; Take 1 tablet (4 mg total) by mouth every 6 (six) hours as needed for muscle spasms.  Dispense: 60 tablet; Refill: 2   Continue all other maintenance medications as listed above.  Follow up plan: Return in about 6 months (around 07/26/2019).  Educational handout given for [place] PA-C [place] [address]  [place], [place] [address] [phone]   01/24/2019, 4:54 PM

## 2019-02-05 DIAGNOSIS — M797 Fibromyalgia: Secondary | ICD-10-CM | POA: Diagnosis not present

## 2019-02-05 DIAGNOSIS — G43909 Migraine, unspecified, not intractable, without status migrainosus: Secondary | ICD-10-CM | POA: Diagnosis not present

## 2019-02-05 DIAGNOSIS — M5430 Sciatica, unspecified side: Secondary | ICD-10-CM | POA: Diagnosis not present

## 2019-02-05 DIAGNOSIS — R69 Illness, unspecified: Secondary | ICD-10-CM | POA: Diagnosis not present

## 2019-02-05 DIAGNOSIS — K219 Gastro-esophageal reflux disease without esophagitis: Secondary | ICD-10-CM | POA: Diagnosis not present

## 2019-02-05 DIAGNOSIS — E039 Hypothyroidism, unspecified: Secondary | ICD-10-CM | POA: Diagnosis not present

## 2019-02-05 DIAGNOSIS — Z780 Asymptomatic menopausal state: Secondary | ICD-10-CM | POA: Diagnosis not present

## 2019-02-05 DIAGNOSIS — R5383 Other fatigue: Secondary | ICD-10-CM | POA: Diagnosis not present

## 2019-02-05 DIAGNOSIS — G8929 Other chronic pain: Secondary | ICD-10-CM | POA: Diagnosis not present

## 2019-02-16 ENCOUNTER — Encounter: Payer: Self-pay | Admitting: Family Medicine

## 2019-02-16 ENCOUNTER — Other Ambulatory Visit: Payer: Self-pay

## 2019-02-16 ENCOUNTER — Ambulatory Visit (INDEPENDENT_AMBULATORY_CARE_PROVIDER_SITE_OTHER): Payer: Medicare HMO | Admitting: Family Medicine

## 2019-02-16 DIAGNOSIS — N3090 Cystitis, unspecified without hematuria: Secondary | ICD-10-CM

## 2019-02-16 MED ORDER — AMOXICILLIN-POT CLAVULANATE 875-125 MG PO TABS: 1.0000 | ORAL_TABLET | Freq: Two times a day (BID) | ORAL | 0 refills | Status: DC

## 2019-02-16 NOTE — Progress Notes (Signed)
Subjective:    Patient ID: Ruth Hopkins, female    DOB: [birth year], 53 y.o.   MRN: [id number]   HPI: Ruth Hopkins is a 53 y.o. female presenting for nausea and weakness. Concerned for UTI. Had one recently that may not have been completely treated. Had an episode of sudden nausea  evening two nights ago. Present since then. Hadn't eaten much and had drank coffee that day. Having frequency. No dysuria. Doesn't usually have that with her UTIs.    Depression screen [place] 2/9 01/23/2019 11/07/2018 10/17/2018 09/26/2018 09/18/2018  Decreased Interest 2 1 0 1 0  Down, Depressed, Hopeless 2 2 1 1 1   PHQ - 2 Score 4 3 1 2 1   Altered sleeping 2 2 - 2 -  Tired, decreased energy 2 2 - 1 -  Change in appetite 0 0 - 0 -  Feeling bad or failure about yourself  1 0 - 0 -  Trouble concentrating 0 0 - 0 -  Moving slowly or fidgety/restless 0 0 - 0 -  Suicidal thoughts 0 0 - 0 -  PHQ-9 Score 9 7 - 5 -  Some recent data might be hidden     Relevant past medical, surgical, family and social history reviewed and updated as indicated.  Interim medical history since our last visit reviewed. Allergies and medications reviewed and updated.  ROS:  Review of Systems  Constitutional: Negative for chills, diaphoresis and fever.  HENT: Negative for congestion.   Eyes: Negative for visual disturbance.  Respiratory: Negative for cough and shortness of breath.   Cardiovascular: Negative for chest pain and palpitations.  Gastrointestinal: Negative for constipation, diarrhea and nausea.  Genitourinary: Positive for dysuria, frequency and urgency. Negative for decreased urine volume, flank pain, hematuria, menstrual problem and pelvic pain.  Musculoskeletal: Negative for arthralgias and joint swelling.  Skin: Negative for rash.  Neurological: Negative for dizziness and numbness.     Social History   Tobacco Use  Smoking Status Light Tobacco Smoker  Smokeless Tobacco Never Used       Objective:     Wt  Readings from Last 3 Encounters:  01/23/19 130 lb 9.6 oz (59.2 kg)  11/07/18 131 lb 3.2 oz (59.5 kg)  10/17/18 133 lb (60.3 kg)     Exam deferred. Pt. Harboring due to COVID 19. Phone visit performed.   Assessment & Plan:   1. Cystitis     Meds ordered this encounter  Medications  . amoxicillin-clavulanate (AUGMENTIN) 875-125 MG tablet    Sig: Take 1 tablet by mouth 2 (two) times daily. Take all of this medication    Dispense:  20 tablet    Refill:  0    No orders of the defined types were placed in this encounter.     Diagnoses and all orders for this visit:  Cystitis  Other orders -     amoxicillin-clavulanate (AUGMENTIN) 875-125 MG tablet; Take 1 tablet by mouth 2 (two) times daily. Take all of this medication    Virtual Visit via telephone Note  I discussed the limitations, risks, security and privacy concerns of performing an evaluation and management service by telephone and the availability of in person appointments. The patient was identified with two identifiers. Pt.expressed understanding and agreed to proceed. Pt. Is at home. Dr. Livia Snellen is in his office.  Follow Up Instructions:   I discussed the assessment and treatment plan with the patient. The patient was provided an opportunity to ask questions and  all were answered. The patient agreed with the plan and demonstrated an understanding of the instructions.   The patient was advised to call back or seek an in-person evaluation if the symptoms worsen or if the condition fails to improve as anticipated.   Total minutes including chart review and phone contact time: 12   Follow up plan: No follow-ups on file.  Claretta Fraise, MD [place]

## 2019-02-27 DIAGNOSIS — Z9189 Other specified personal risk factors, not elsewhere classified: Secondary | ICD-10-CM | POA: Diagnosis not present

## 2019-02-27 DIAGNOSIS — Z86000 Personal history of in-situ neoplasm of breast: Secondary | ICD-10-CM | POA: Diagnosis not present

## 2019-02-27 DIAGNOSIS — Z1239 Encounter for other screening for malignant neoplasm of breast: Secondary | ICD-10-CM | POA: Diagnosis not present

## 2019-02-27 DIAGNOSIS — R922 Inconclusive mammogram: Secondary | ICD-10-CM | POA: Diagnosis not present

## 2019-03-05 DIAGNOSIS — L659 Nonscarring hair loss, unspecified: Secondary | ICD-10-CM | POA: Diagnosis not present

## 2019-03-05 DIAGNOSIS — L639 Alopecia areata, unspecified: Secondary | ICD-10-CM | POA: Diagnosis not present

## 2019-03-15 ENCOUNTER — Encounter: Payer: Self-pay | Admitting: Nurse Practitioner

## 2019-03-15 ENCOUNTER — Ambulatory Visit (INDEPENDENT_AMBULATORY_CARE_PROVIDER_SITE_OTHER): Payer: Medicare HMO | Admitting: Nurse Practitioner

## 2019-03-15 ENCOUNTER — Other Ambulatory Visit: Payer: Self-pay

## 2019-03-15 DIAGNOSIS — N3090 Cystitis, unspecified without hematuria: Secondary | ICD-10-CM

## 2019-03-15 MED ORDER — AMOXICILLIN-POT CLAVULANATE 875-125 MG PO TABS: 1.0000 | ORAL_TABLET | Freq: Two times a day (BID) | ORAL | 0 refills | Status: DC

## 2019-03-15 NOTE — Progress Notes (Signed)
   Virtual Visit via telephone Note  I connected with [name] on 03/15/19 at 10:30 by video and verified that I am speaking with the correct person using two identifiers. Ruth Hopkins is currently located at home and no one is currently with her during visit. The provider, Mary-Margaret Hassell Done, FNP is located in their office at time of visit.  I discussed the limitations, risks, security and privacy concerns of performing an evaluation and management service by telephone and the availability of in person appointments. I also discussed with the patient that there may be a patient responsible charge related to this service. The patient expressed understanding and agreed to proceed.   History and Present Illness:   Chief Complaint: Urinary Tract Infection   HPI Patient is c/o wit dysuria that started  About 4 days ago. She has urinary frequency with urgency and is only voiding a scant amount.   Review of Systems  Constitutional: Negative.  Negative for fever.  Respiratory: Negative.   Cardiovascular: Negative.   Genitourinary: Positive for dysuria, frequency and urgency. Negative for flank pain.  Neurological: Negative.   Psychiatric/Behavioral: Negative.   All other systems reviewed and are negative.      Observations/Objective: Alert and oriented- answers all questions appropriately No distress  Assessment and Plan: [name] in today with chief complaint of Urinary Tract Infection   1. Cystitis Take medication as prescribe Cotton underwear Take shower not bath Cranberry juice, yogurt Force fluids AZO over the counter X2 days RTO prn  - amoxicillin-clavulanate (AUGMENTIN) 875-125 MG tablet; Take 1 tablet by mouth 2 (two) times daily.  Dispense: 20 tablet; Refill: 0   Follow Up Instructions: prn    I discussed the assessment and treatment plan with the patient. The patient was provided an opportunity to ask questions and all were answered. The patient agreed  with the plan and demonstrated an understanding of the instructions.   The patient was advised to call back or seek an in-person evaluation if the symptoms worsen or if the condition fails to improve as anticipated.  The above assessment and management plan was discussed with the patient. The patient verbalized understanding of and has agreed to the management plan. Patient is aware to call the clinic if symptoms persist or worsen. Patient is aware when to return to the clinic for a follow-up visit. Patient educated on when it is appropriate to go to the emergency department.   Time call ended: 10:40  I provided 10 minutes of face-to-face time during this encounter.    Mary-Margaret Hassell Done, FNP

## 2019-03-25 ENCOUNTER — Other Ambulatory Visit: Payer: Self-pay | Admitting: Physician Assistant

## 2019-04-16 DIAGNOSIS — R69 Illness, unspecified: Secondary | ICD-10-CM | POA: Diagnosis not present

## 2019-04-17 ENCOUNTER — Ambulatory Visit: Payer: Medicare HMO | Admitting: Family Medicine

## 2019-04-17 ENCOUNTER — Encounter: Payer: Self-pay | Admitting: Family Medicine

## 2019-04-17 ENCOUNTER — Ambulatory Visit (INDEPENDENT_AMBULATORY_CARE_PROVIDER_SITE_OTHER): Payer: Medicare HMO | Admitting: Family Medicine

## 2019-04-17 DIAGNOSIS — R300 Dysuria: Secondary | ICD-10-CM | POA: Diagnosis not present

## 2019-04-17 LAB — MICROSCOPIC EXAMINATION
Epithelial Cells (non renal): >10 /hpf — AB (ref 0–10)
Renal Epithel, UA: NONE SEEN /hpf

## 2019-04-17 LAB — URINALYSIS, COMPLETE
Bilirubin, UA: NEGATIVE
Glucose, UA: NEGATIVE
Ketones, UA: NEGATIVE
Leukocytes,UA: NEGATIVE
Nitrite, UA: NEGATIVE
Protein,UA: NEGATIVE
Specific Gravity, UA: 1.025 (ref 1.005–1.030)
Urobilinogen, Ur: 0.2 mg/dL (ref 0.2–1.0)
pH, UA: 6 (ref 5.0–7.5)

## 2019-04-17 MED ORDER — CEPHALEXIN 500 MG PO CAPS: 500.0000 mg | ORAL_CAPSULE | Freq: Three times a day (TID) | ORAL | 0 refills | Status: AC

## 2019-04-18 NOTE — Progress Notes (Signed)
Virtual Visit via telephone Note Due to COVID-19 pandemic this visit was conducted virtually. This visit type was conducted due to national recommendations for restrictions regarding the COVID-19 Pandemic (e.g. social distancing, sheltering in place) in an effort to limit this patient's exposure and mitigate transmission in our community. All issues noted in this document were discussed and addressed.  A physical exam was not performed with this format.   I connected with Ruth Hopkins on 04/18/19 at 1110 by telephone and verified that I am speaking with the correct person using two identifiers. Ruth Hopkins is currently located at home and family is currently with them during visit. The provider, Monia Pouch, FNP is located in their office at time of visit.  I discussed the limitations, risks, security and privacy concerns of performing an evaluation and management service by telephone and the availability of in person appointments. I also discussed with the patient that there may be a patient responsible charge related to this service. The patient expressed understanding and agreed to proceed.  Subjective:  Patient ID: Ruth Hopkins, female    DOB: 06/07/1966, 53 y.o.   MRN: [id number]  Chief Complaint:  Dysuria   HPI: Ruth Hopkins is a 53 y.o. female presenting on 04/17/2019 for Dysuria   Pt reports 4-5 days of dysuria, lower abdominal pressure, frequency, and urgency. No flank pain, hematuria, fever, chills, weakness, fatigue, or confusion. States symptoms are similar to when she had an urinary tract infection.   Dysuria  This is a new problem. The current episode started in the past 7 days. The problem occurs every urination. The problem has been gradually worsening. The quality of the pain is described as aching and burning. The pain is at a severity of 3/10. The pain is mild. There has been no fever. She is not sexually active. There is no history of pyelonephritis. Associated symptoms  include frequency and urgency. Pertinent negatives include no chills, discharge, flank pain, hematuria, hesitancy, nausea, possible pregnancy, sweats or vomiting. She has tried increased fluids for the symptoms. The treatment provided no relief.     Relevant past medical, surgical, family, and social history reviewed and updated as indicated.  Allergies and medications reviewed and updated.   Past Medical History:  Diagnosis Date  . Chronic fatigue   . Depression   . Fibromyalgia   . IBS (irritable bowel syndrome)   . Migraine   . Thyroid disease     Past Surgical History:  Procedure Laterality Date  . BREAST SURGERY Right 2007   lumpectomy  . FOOT SURGERY      Social History   Socioeconomic History  . Marital status: Married    Spouse name: Not on file  . Number of children: Not on file  . Years of education: Not on file  . Highest education level: Not on file  Occupational History  . Not on file  Social Needs  . Financial resource strain: Not on file  . Food insecurity    Worry: Not on file    Inability: Not on file  . Transportation needs    Medical: Not on file    Non-medical: Not on file  Tobacco Use  . Smoking status: Light Tobacco Smoker  . Smokeless tobacco: Never Used  Substance and Sexual Activity  . Alcohol use: No  . Drug use: No  . Sexual activity: Not on file  Lifestyle  . Physical activity    Days per week: Not on file  Minutes per session: Not on file  . Stress: Not on file  Relationships  . Social Herbalist on phone: Not on file    Gets together: Not on file    Attends religious service: Not on file    Active member of club or organization: Not on file    Attends meetings of clubs or organizations: Not on file    Relationship status: Not on file  . Intimate partner violence    Fear of current or ex partner: Not on file    Emotionally abused: Not on file    Physically abused: Not on file    Forced sexual activity: Not on  file  Other Topics Concern  . Not on file  Social History Narrative  . Not on file    Outpatient Encounter Medications as of 04/17/2019  Medication Sig  . acetaminophen (TYLENOL) 500 MG tablet Take by mouth.  Marland Kitchen amoxicillin-clavulanate (AUGMENTIN) 875-125 MG tablet Take 1 tablet by mouth 2 (two) times daily. Take all of this medication  . amoxicillin-clavulanate (AUGMENTIN) 875-125 MG tablet Take 1 tablet by mouth 2 (two) times daily.  Marland Kitchen buPROPion (WELLBUTRIN XL) 300 MG 24 hr tablet Take 1 tablet (300 mg total) by mouth daily.  . cephALEXin (KEFLEX) 500 MG capsule Take 1 capsule (500 mg total) by mouth 3 (three) times daily for 7 days.  . clonazePAM (KLONOPIN) 0.5 MG tablet Take 0.5 mg by mouth 2 (two) times daily.   . cycloSPORINE (RESTASIS) 0.05 % ophthalmic emulsion Restasis 0.05 % eye drops in a dropperette  USE 1 DROP INTO BOTH EYES TWICE A DAY  . diclofenac (VOLTAREN) 75 MG EC tablet Take 1 tablet (75 mg total) by mouth 2 (two) times daily.  Marland Kitchen gabapentin (NEURONTIN) 100 MG capsule Take 1 capsule (100 mg total) by mouth 2 (two) times daily.  Marland Kitchen imipramine (TOFRANIL) 10 MG tablet Take 3 tablets (30 mg total) by mouth at bedtime.  Marland Kitchen levothyroxine (SYNTHROID) 50 MCG tablet TAKE 1 TABLET BY MOUTH ONCE DAILY BEFORE BREAKFAST  . MINIVELLE 0.1 MG/24HR patch Place 1 patch onto the skin 2 (two) times a week.   . mometasone (NASONEX) 50 MCG/ACT nasal spray PLACE 2 SPRAYS INTO THE NOSE DAILY.  . Multiple Vitamins-Minerals (MULTIVITAMIN PO) Take by mouth.  Marland Kitchen omeprazole (PRILOSEC) 20 MG capsule Take 1 capsule (20 mg total) by mouth daily.  Marland Kitchen tiZANidine (ZANAFLEX) 4 MG tablet Take 1 tablet (4 mg total) by mouth every 6 (six) hours as needed for muscle spasms.  . valACYclovir (VALTREX) 500 MG tablet    No facility-administered encounter medications on file as of 04/17/2019.     Allergies  Allergen Reactions  . Latex Itching and Rash  . Codeine   . Sulfa Antibiotics     Review of Systems   Constitutional: Negative for activity change, appetite change, chills, diaphoresis, fatigue, fever and unexpected weight change.  Respiratory: Negative for cough, chest tightness and shortness of breath.   Cardiovascular: Negative for chest pain and palpitations.  Gastrointestinal: Positive for abdominal pain (lower abdominal pressure). Negative for abdominal distention, nausea and vomiting.  Genitourinary: Positive for dysuria, frequency and urgency. Negative for decreased urine volume, difficulty urinating, dyspareunia, enuresis, flank pain, genital sores, hematuria, hesitancy, pelvic pain, vaginal bleeding, vaginal discharge and vaginal pain.  Musculoskeletal: Negative for arthralgias and myalgias.  Neurological: Negative for dizziness, weakness and headaches.  Psychiatric/Behavioral: Negative for confusion.  All other systems reviewed and are negative.  Observations/Objective: No vital signs or physical exam, this was a telephone or virtual health encounter.  Pt alert and oriented, answers all questions appropriately, and able to speak in full sentences.    Assessment and Plan: Norrine was seen today for dysuria.  Diagnoses and all orders for this visit:  Dysuria Reported symptoms consistent with acute cystitis. Pt will bring specimen in for evaluation. Previous cultures reviewed and were susceptible to Keflex, will initiate today. Culture ordered, will change therapy if warranted. No red flags present for pyelonephritis. Symptomatic care discussed. Report any new or worsening symptoms. Recheck urine in 2 weeks.  -     cephALEXin (KEFLEX) 500 MG capsule; Take 1 capsule (500 mg total) by mouth 3 (three) times daily for 7 days. -     Urine Culture -     Urinalysis, Complete -     Microscopic Examination     Follow Up Instructions: Return in about 2 weeks (around 05/01/2019), or if symptoms worsen or fail to improve, for Urine recheck.    I discussed the assessment and  treatment plan with the patient. The patient was provided an opportunity to ask questions and all were answered. The patient agreed with the plan and demonstrated an understanding of the instructions.   The patient was advised to call back or seek an in-person evaluation if the symptoms worsen or if the condition fails to improve as anticipated.  The above assessment and management plan was discussed with the patient. The patient verbalized understanding of and has agreed to the management plan. Patient is aware to call the clinic if symptoms persist or worsen. Patient is aware when to return to the clinic for a follow-up visit. Patient educated on when it is appropriate to go to the emergency department.    I provided 15 minutes of non-face-to-face time during this encounter. The call started at 1110. The call ended at 1125. The other time was used for coordination of care.    Monia Pouch, FNP-C [facility] [address] [place], [place] [address] [phone]

## 2019-04-19 DIAGNOSIS — R69 Illness, unspecified: Secondary | ICD-10-CM | POA: Diagnosis not present

## 2019-04-19 LAB — URINE CULTURE

## 2019-04-23 ENCOUNTER — Encounter: Payer: Self-pay | Admitting: *Deleted

## 2019-05-02 DIAGNOSIS — Z1239 Encounter for other screening for malignant neoplasm of breast: Secondary | ICD-10-CM | POA: Diagnosis not present

## 2019-05-02 DIAGNOSIS — R928 Other abnormal and inconclusive findings on diagnostic imaging of breast: Secondary | ICD-10-CM | POA: Diagnosis not present

## 2019-05-02 DIAGNOSIS — Z9289 Personal history of other medical treatment: Secondary | ICD-10-CM | POA: Diagnosis not present

## 2019-05-02 DIAGNOSIS — R922 Inconclusive mammogram: Secondary | ICD-10-CM | POA: Diagnosis not present

## 2019-05-02 DIAGNOSIS — Z86000 Personal history of in-situ neoplasm of breast: Secondary | ICD-10-CM | POA: Diagnosis not present

## 2019-05-02 DIAGNOSIS — Z853 Personal history of malignant neoplasm of breast: Secondary | ICD-10-CM | POA: Diagnosis not present

## 2019-05-02 DIAGNOSIS — Z9189 Other specified personal risk factors, not elsewhere classified: Secondary | ICD-10-CM | POA: Diagnosis not present

## 2019-05-11 ENCOUNTER — Telehealth: Payer: Self-pay | Admitting: Physician Assistant

## 2019-05-11 ENCOUNTER — Other Ambulatory Visit: Payer: Self-pay | Admitting: Physician Assistant

## 2019-05-11 MED ORDER — CEPHALEXIN 500 MG PO CAPS: 500.0000 mg | ORAL_CAPSULE | Freq: Three times a day (TID) | ORAL | 0 refills | Status: DC

## 2019-05-11 NOTE — Telephone Encounter (Signed)
Sent to [facility] 

## 2019-05-11 NOTE — Telephone Encounter (Signed)
Patient aware.

## 2019-05-11 NOTE — Telephone Encounter (Signed)
Patient states that she woke up Sunday with nausea and sick to stomach, fatigue,  urinary frequency and burning with urination.  Patient would like a refill of keflex to be sent to pharmacy

## 2019-05-23 ENCOUNTER — Encounter: Payer: Self-pay | Admitting: Physician Assistant

## 2019-05-23 ENCOUNTER — Ambulatory Visit (INDEPENDENT_AMBULATORY_CARE_PROVIDER_SITE_OTHER): Payer: Medicare HMO | Admitting: Physician Assistant

## 2019-05-23 DIAGNOSIS — N3000 Acute cystitis without hematuria: Secondary | ICD-10-CM | POA: Diagnosis not present

## 2019-05-23 DIAGNOSIS — R300 Dysuria: Secondary | ICD-10-CM | POA: Diagnosis not present

## 2019-05-23 DIAGNOSIS — M545 Low back pain, unspecified: Secondary | ICD-10-CM

## 2019-05-23 MED ORDER — METHOCARBAMOL 500 MG PO TABS: 500.0000 mg | ORAL_TABLET | Freq: Four times a day (QID) | ORAL | 5 refills | Status: DC | PRN

## 2019-05-23 MED ORDER — HYDROCODONE-ACETAMINOPHEN 10-325 MG PO TABS: 1.0000 | ORAL_TABLET | ORAL | 0 refills | Status: AC | PRN

## 2019-05-23 MED ORDER — PREDNISONE 10 MG (48) PO TBPK: ORAL_TABLET | ORAL | 0 refills | Status: DC

## 2019-05-23 MED ORDER — CEPHALEXIN 500 MG PO CAPS: 500.0000 mg | ORAL_CAPSULE | Freq: Three times a day (TID) | ORAL | 0 refills | Status: DC

## 2019-05-23 NOTE — Progress Notes (Signed)
Telephone visit  Subjective: [id number] pain, left PCP: Terald Sleeper, PA-C [id number] H Duve is a 53 y.o. female calls for telephone consult today. Patient provides verbal consent for consult held via phone.  Patient is identified with 2 separate identifiers.  At this time the entire area is on COVID-19 social distancing and stay home orders are in place.  Patient is of higher risk and therefore we are performing this by a virtual method.  Location of patient: home Location of provider: HOME Others present for call: no  She has had back pain in the umbar are and left side  Without radiation down the leg.  Within the last calendar year the patient has had an episode of this bothering her on her right side.  She did go through some medicine and exercising and it did improve.  This is very reminiscent of that episode and she would like to get started on this is soon as possible so that it does not get as bad as the previous one did.  She has taken prednisone taper pack in the past.  Also she does need some Robaxin that she can take on a more regular basis.  It does not make her sleepy.  In the past she has seen Dr. Cyndy Freeze  This patient has had several days of dysuria, frequency and nocturia. There is also pain over the bladder in the suprapubic region, no back pain. Denies leakage or hematuria.  Denies fever or chills. No pain in flank area.   ROS: Per HPI  Allergies  Allergen Reactions  . Latex Itching and Rash  . Codeine   . Sulfa Antibiotics    Past Medical History:  Diagnosis Date  . Chronic fatigue   . Depression   . Fibromyalgia   . IBS (irritable bowel syndrome)   . Migraine   . Thyroid disease     Current Outpatient Medications:  .  acetaminophen (TYLENOL) 500 MG tablet, Take by mouth., Disp: , Rfl:  .  buPROPion (WELLBUTRIN XL) 300 MG 24 hr tablet, Take 1 tablet (300 mg total) by mouth daily., Disp: 30 tablet, Rfl: 11 .  clonazePAM (KLONOPIN) 0.5 MG tablet, Take 0.5  mg by mouth 2 (two) times daily. , Disp: , Rfl:  .  cycloSPORINE (RESTASIS) 0.05 % ophthalmic emulsion, Restasis 0.05 % eye drops in a dropperette  USE 1 DROP INTO BOTH EYES TWICE A DAY, Disp: , Rfl:  .  diclofenac (VOLTAREN) 75 MG EC tablet, Take 1 tablet (75 mg total) by mouth 2 (two) times daily., Disp: 180 tablet, Rfl: 3 .  gabapentin (NEURONTIN) 100 MG capsule, Take 1 capsule (100 mg total) by mouth 2 (two) times daily., Disp: 60 capsule, Rfl: 5 .  HYDROcodone-acetaminophen (NORCO) 10-325 MG tablet, Take 1 tablet by mouth every 4 (four) hours as needed for up to 5 days., Disp: 40 tablet, Rfl: 0 .  imipramine (TOFRANIL) 10 MG tablet, Take 3 tablets (30 mg total) by mouth at bedtime., Disp: 90 tablet, Rfl: 11 .  levothyroxine (SYNTHROID) 50 MCG tablet, TAKE 1 TABLET BY MOUTH ONCE DAILY BEFORE BREAKFAST, Disp: 90 tablet, Rfl: 3 .  methocarbamol (ROBAXIN) 500 MG tablet, Take 1 tablet (500 mg total) by mouth every 6 (six) hours as needed for muscle spasms., Disp: 30 tablet, Rfl: 5 .  MINIVELLE 0.1 MG/24HR patch, Place 1 patch onto the skin 2 (two) times a week. , Disp: , Rfl:  .  mometasone (NASONEX) 50 MCG/ACT nasal spray, PLACE  2 SPRAYS INTO THE NOSE DAILY., Disp: 51 g, Rfl: 1 .  Multiple Vitamins-Minerals (MULTIVITAMIN PO), Take by mouth., Disp: , Rfl:  .  omeprazole (PRILOSEC) 20 MG capsule, Take 1 capsule (20 mg total) by mouth daily., Disp: 90 capsule, Rfl: 3 .  predniSONE (STERAPRED UNI-PAK 48 TAB) 10 MG (48) TBPK tablet, Take as directed for 12 days, Disp: 48 tablet, Rfl: 0 .  valACYclovir (VALTREX) 500 MG tablet, , Disp: , Rfl:   Assessment/ Plan: 53 y.o. female   1. Acute left-sided low back pain without sciatica - methocarbamol (ROBAXIN) 500 MG tablet; Take 1 tablet (500 mg total) by mouth every 6 (six) hours as needed for muscle spasms.  Dispense: 30 tablet; Refill: 5 - HYDROcodone-acetaminophen (NORCO) 10-325 MG tablet; Take 1 tablet by mouth every 4 (four) hours as needed for up to  5 days.  Dispense: 40 tablet; Refill: 0 - predniSONE (STERAPRED UNI-PAK 48 TAB) 10 MG (48) TBPK tablet; Take as directed for 12 days  Dispense: 48 tablet; Refill: 0  2. Dysuria - Urine Culture; Future - Urinalysis, Complete; Future - cephALEXin (KEFLEX) 500 MG capsule; Take 1 capsule (500 mg total) by mouth 3 (three) times daily.  Dispense: 30 capsule; Refill: 0  3. Acute cystitis without hematuria - cephALEXin (KEFLEX) 500 MG capsule; Take 1 capsule (500 mg total) by mouth 3 (three) times daily.  Dispense: 30 capsule; Refill: 0   No follow-ups on file.  Continue all other maintenance medications as listed above.  Start time: 12:11 PM End time: 12:25 PM  Meds ordered this encounter  Medications  . methocarbamol (ROBAXIN) 500 MG tablet    Sig: Take 1 tablet (500 mg total) by mouth every 6 (six) hours as needed for muscle spasms.    Dispense:  30 tablet    Refill:  5    Order Specific Question:   Supervising Provider    Answer:   Janora Norlander [id number]  . HYDROcodone-acetaminophen (NORCO) 10-325 MG tablet    Sig: Take 1 tablet by mouth every 4 (four) hours as needed for up to 5 days.    Dispense:  40 tablet    Refill:  0    Order Specific Question:   Supervising Provider    Answer:   Janora Norlander [id number]  . predniSONE (STERAPRED UNI-PAK 48 TAB) 10 MG (48) TBPK tablet    Sig: Take as directed for 12 days    Dispense:  48 tablet    Refill:  0    Order Specific Question:   Supervising Provider    Answer:   Janora Norlander [id number]    Particia Nearing PA-C [place] [phone]

## 2019-06-06 ENCOUNTER — Other Ambulatory Visit: Payer: Self-pay | Admitting: Physician Assistant

## 2019-06-11 ENCOUNTER — Other Ambulatory Visit: Payer: Self-pay

## 2019-06-11 ENCOUNTER — Ambulatory Visit (INDEPENDENT_AMBULATORY_CARE_PROVIDER_SITE_OTHER): Payer: Medicare HMO | Admitting: Podiatry

## 2019-06-11 DIAGNOSIS — M779 Enthesopathy, unspecified: Secondary | ICD-10-CM

## 2019-06-11 DIAGNOSIS — Q828 Other specified congenital malformations of skin: Secondary | ICD-10-CM

## 2019-06-11 NOTE — Progress Notes (Signed)
Subjective:   Patient ID: Ruth Hopkins, female   DOB: 53 y.o.   MRN: [id number]   HPI Patient presents stating her ankles have started to hurt her again and she is trying to be active   ROS      Objective:  Physical Exam  Neurovascular status intact with inflammation of the sinus tarsi bilateral with pain upon palpation right upper left     Assessment:  Inflammatory capsulitis sinus tarsitis not been treated for 5 months     Plan:  Sterile prep and injected the sinus tarsi bilateral 3 mg Kenalog 5 mg Xylocaine and advised on continued orthotic usage and reappoint as needed

## 2019-06-22 ENCOUNTER — Other Ambulatory Visit: Payer: Self-pay

## 2019-06-22 ENCOUNTER — Other Ambulatory Visit: Payer: Medicare HMO

## 2019-06-22 DIAGNOSIS — R300 Dysuria: Secondary | ICD-10-CM | POA: Diagnosis not present

## 2019-06-22 LAB — URINALYSIS, COMPLETE
Bilirubin, UA: NEGATIVE
Glucose, UA: NEGATIVE
Ketones, UA: NEGATIVE
Leukocytes,UA: NEGATIVE
Nitrite, UA: NEGATIVE
Protein,UA: NEGATIVE
Specific Gravity, UA: 1.025 (ref 1.005–1.030)
Urobilinogen, Ur: 0.2 mg/dL (ref 0.2–1.0)
pH, UA: 7.5 (ref 5.0–7.5)

## 2019-06-22 LAB — MICROSCOPIC EXAMINATION: Renal Epithel, UA: NONE SEEN /hpf

## 2019-06-24 LAB — URINE CULTURE

## 2019-06-26 DIAGNOSIS — R69 Illness, unspecified: Secondary | ICD-10-CM | POA: Diagnosis not present

## 2019-07-12 DIAGNOSIS — F419 Anxiety disorder, unspecified: Secondary | ICD-10-CM | POA: Insufficient documentation

## 2019-07-12 DIAGNOSIS — Z01419 Encounter for gynecological examination (general) (routine) without abnormal findings: Secondary | ICD-10-CM | POA: Diagnosis not present

## 2019-07-12 DIAGNOSIS — Z6822 Body mass index (BMI) 22.0-22.9, adult: Secondary | ICD-10-CM | POA: Diagnosis not present

## 2019-07-31 ENCOUNTER — Ambulatory Visit (INDEPENDENT_AMBULATORY_CARE_PROVIDER_SITE_OTHER): Payer: Medicare HMO | Admitting: Physician Assistant

## 2019-07-31 ENCOUNTER — Other Ambulatory Visit: Payer: Self-pay

## 2019-07-31 ENCOUNTER — Encounter: Payer: Self-pay | Admitting: Physician Assistant

## 2019-07-31 VITALS — BP 102/68 | HR 75 | Temp 97.1°F | Ht 64.0 in | Wt 132.0 lb

## 2019-07-31 DIAGNOSIS — E039 Hypothyroidism, unspecified: Secondary | ICD-10-CM | POA: Diagnosis not present

## 2019-07-31 DIAGNOSIS — L659 Nonscarring hair loss, unspecified: Secondary | ICD-10-CM

## 2019-07-31 MED ORDER — ESTRADIOL 0.1 MG/24HR TD PTTW: 1.0000 | MEDICATED_PATCH | TRANSDERMAL | Status: DC

## 2019-08-01 LAB — THYROID PANEL WITH TSH
Free Thyroxine Index: 1.5 (ref 1.2–4.9)
T3 Uptake Ratio: 27 % (ref 24–39)
T4, Total: 5.7 ug/dL (ref 4.5–12.0)
TSH: 3.73 u[IU]/mL (ref 0.450–4.500)

## 2019-08-02 ENCOUNTER — Telehealth: Payer: Self-pay | Admitting: Podiatry

## 2019-08-02 ENCOUNTER — Other Ambulatory Visit: Payer: Self-pay | Admitting: Physician Assistant

## 2019-08-02 DIAGNOSIS — M779 Enthesopathy, unspecified: Secondary | ICD-10-CM

## 2019-08-02 DIAGNOSIS — Q828 Other specified congenital malformations of skin: Secondary | ICD-10-CM

## 2019-08-02 NOTE — Progress Notes (Signed)
BP 102/68   Pulse 75   Temp (!) 97.1 F (36.2 C) (Temporal)   Ht 5\' 4"  (1.626 m)   Wt 132 lb (59.9 kg)   SpO2 99%   BMI 22.66 kg/m    Subjective:    Patient ID: Ruth Hopkins, female    DOB: 07/13/1966, 53 y.o.   MRN: [id number]  HPI: Ruth Hopkins is a 53 y.o. female presenting on 07/31/2019 for Medical Management of Chronic Issues, Depression, and Hypothyroidism  Patient comes in for just a recheck on her chronic medical conditions.  She does have hypothyroidism.  We will have labs performed today.  She notes that she has had increased hair loss.  Particularly on the right side.  It is thin throughout.  There are no specific areas of alopecia.  She is quite concerned about this.  We discussed that she may need to have dermatology evaluation.  And she agrees.  She is still under the care of podiatry for her feet and oncology for her previous breast cancer.  Her chart is reviewed.  Past Medical History:  Diagnosis Date  . Chronic fatigue   . Depression   . Fibromyalgia   . IBS (irritable bowel syndrome)   . Migraine   . Thyroid disease    Relevant past medical, surgical, family and social history reviewed and updated as indicated. Interim medical history since our last visit reviewed. Allergies and medications reviewed and updated. DATA REVIEWED: CHART IN EPIC  Family History reviewed for pertinent findings.  Review of Systems  Constitutional: Negative.   HENT: Negative.   Eyes: Negative.   Respiratory: Negative.   Gastrointestinal: Negative.   Genitourinary: Negative.     Allergies as of 07/31/2019      Reactions   Latex Itching, Rash   Codeine    Sulfa Antibiotics       Medication List       Accurate as of July 31, 2019 11:59 PM. If you have any questions, ask your nurse or doctor.        STOP taking these medications   cephALEXin 500 MG capsule Commonly known as: Keflex Stopped by: Terald Sleeper, PA-C   gabapentin 100 MG capsule Commonly known  as: NEURONTIN Stopped by: Terald Sleeper, PA-C   predniSONE 10 MG (48) Tbpk tablet Commonly known as: STERAPRED UNI-PAK 48 TAB Stopped by: Terald Sleeper, PA-C     TAKE these medications   acetaminophen 500 MG tablet Commonly known as: TYLENOL Take by mouth.   buPROPion 300 MG 24 hr tablet Commonly known as: WELLBUTRIN XL Take 1 tablet (300 mg total) by mouth daily.   clonazePAM 0.5 MG tablet Commonly known as: KLONOPIN Take 0.5 mg by mouth 2 (two) times daily.   diclofenac 75 MG EC tablet Commonly known as: VOLTAREN Take 1 tablet (75 mg total) by mouth 2 (two) times daily.   eletriptan 40 MG tablet Commonly known as: RELPAX Take by mouth.   estradiol 0.1 MG/24HR patch Commonly known as: Miniv[place] Place 1 patch (0.1 mg total) onto the skin 2 (two) times a week. What changed:   medication strength  how much to take Changed by: Terald Sleeper, PA-C   HYDROcodone-acetaminophen 10-325 MG tablet Commonly known as: NORCO as needed.   imipramine 10 MG tablet Commonly known as: TOFRANIL Take 3 tablets (30 mg total) by mouth at bedtime.   levothyroxine 50 MCG tablet Commonly known as: SYNTHROID TAKE 1 TABLET BY MOUTH ONCE DAILY  BEFORE BREAKFAST   methocarbamol 500 MG tablet Commonly known as: ROBAXIN Take 1 tablet (500 mg total) by mouth every 6 (six) hours as needed for muscle spasms.   mometasone 50 MCG/ACT nasal spray Commonly known as: NASONEX PLACE 2 SPRAYS INTO THE NOSE DAILY.   MULTIVITAMIN PO Take by mouth.   omeprazole 20 MG capsule Commonly known as: PRILOSEC Take 1 capsule (20 mg total) by mouth daily.   Restasis 0.05 % ophthalmic emulsion Generic drug: cycloSPORINE Restasis 0.05 % eye drops in a dropperette  USE 1 DROP INTO BOTH EYES TWICE A DAY   valACYclovir 500 MG tablet Commonly known as: VALTREX          Objective:    BP 102/68   Pulse 75   Temp (!) 97.1 F (36.2 C) (Temporal)   Ht 5\' 4"  (1.626 m)   Wt 132 lb (59.9 kg)   SpO2  99%   BMI 22.66 kg/m   Allergies  Allergen Reactions  . Latex Itching and Rash  . Codeine   . Sulfa Antibiotics     Wt Readings from Last 3 Encounters:  07/31/19 132 lb (59.9 kg)  01/23/19 130 lb 9.6 oz (59.2 kg)  11/07/18 131 lb 3.2 oz (59.5 kg)    Physical Exam Constitutional:      General: She is not in acute distress.    Appearance: Normal appearance. She is well-developed.  HENT:     Head: Normocephalic and atraumatic.  Cardiovascular:     Rate and Rhythm: Normal rate.  Pulmonary:     Effort: Pulmonary effort is normal.  Skin:    General: Skin is warm and dry.     Findings: No rash.     Comments: Hair thinning throughout  Neurological:     Mental Status: She is alert and oriented to person, place, and time.     Deep Tendon Reflexes: Reflexes are normal and symmetric.     Results for orders placed or performed in visit on 07/31/19  Thyroid Panel With TSH  Result Value Ref Range   TSH 3.730 0.450 - 4.500 uIU/mL   T4, Total 5.7 4.5 - 12.0 ug/dL   T3 Uptake Ratio 27 24 - 39 %   Free Thyroxine Index 1.5 1.2 - 4.9      Assessment & Plan:   1. Acquired hypothyroidism - Thyroid Panel With TSH  2. Hair thinning - Ambulatory referral to Dermatology   Continue all other maintenance medications as listed above.  Follow up plan: Return in about 6 months (around 01/28/2020).  Educational handout given for [place] PA-C [facility] [address]  [place], [place] [address] [phone]   08/02/2019, 2:59 PM

## 2019-08-02 NOTE — Telephone Encounter (Signed)
Pt called and left message yesterday about the orthotics she had gotten from [facility]. She said they are made out of a different material and [facility] told her to call you if they did not work for her. She stated ever gave her a invoice number to go by to make another pair. Pt would like a call back to discuss.

## 2019-08-02 NOTE — Telephone Encounter (Signed)
Called patient to discuss f/o.  [facility] has been working directly with her; told her we couldn't help her with her f/o anymore b/c we cannot get them right.

## 2019-08-14 DIAGNOSIS — H04123 Dry eye syndrome of bilateral lacrimal glands: Secondary | ICD-10-CM | POA: Diagnosis not present

## 2019-08-14 DIAGNOSIS — Z0100 Encounter for examination of eyes and vision without abnormal findings: Secondary | ICD-10-CM | POA: Diagnosis not present

## 2019-08-14 DIAGNOSIS — H5200 Hypermetropia, unspecified eye: Secondary | ICD-10-CM | POA: Diagnosis not present

## 2019-08-15 ENCOUNTER — Ambulatory Visit (INDEPENDENT_AMBULATORY_CARE_PROVIDER_SITE_OTHER): Payer: Medicare HMO | Admitting: Physician Assistant

## 2019-08-15 ENCOUNTER — Encounter: Payer: Self-pay | Admitting: Physician Assistant

## 2019-08-15 ENCOUNTER — Other Ambulatory Visit: Payer: Self-pay

## 2019-08-15 DIAGNOSIS — J0110 Acute frontal sinusitis, unspecified: Secondary | ICD-10-CM

## 2019-08-15 MED ORDER — AMOXICILLIN 500 MG PO CAPS: 500.0000 mg | ORAL_CAPSULE | Freq: Three times a day (TID) | ORAL | 0 refills | Status: DC

## 2019-08-15 MED ORDER — ALBUTEROL SULFATE HFA 108 (90 BASE) MCG/ACT IN AERS: 2.0000 | INHALATION_SPRAY | Freq: Four times a day (QID) | RESPIRATORY_TRACT | 0 refills | Status: DC | PRN

## 2019-08-15 MED ORDER — FLUCONAZOLE 150 MG PO TABS: 150.0000 mg | ORAL_TABLET | Freq: Once | ORAL | 0 refills | Status: AC

## 2019-08-15 MED ORDER — MECLIZINE HCL 25 MG PO TABS: 25.0000 mg | ORAL_TABLET | Freq: Three times a day (TID) | ORAL | 0 refills | Status: DC | PRN

## 2019-08-15 MED ORDER — PREDNISONE 10 MG (21) PO TBPK: ORAL_TABLET | ORAL | 0 refills | Status: DC

## 2019-08-15 NOTE — Progress Notes (Signed)
Telephone visit  Subjective: [id number], sore throat, fatigue PCP: Terald Sleeper, PA-C [id number] H Bering is a 53 y.o. female calls for telephone consult today. Patient provides verbal consent for consult held via phone.  Patient is identified with 2 separate identifiers.  At this time the entire area is on COVID-19 social distancing and stay home orders are in place.  Patient is of higher risk and therefore we are performing this by a virtual method.  Location of patient: home Location of provider: HOME Others present for call: no  This patient has had two days of right ear pain and dizziness when bending forward. Slight sore throat and right side of nose with pain and drainage, mild sinus pressure. There is copious drainage at times. Denies any fever at this time. There has been a history of sinus infections in the past.    Does not know of any COVID exposure.     ROS: Per HPI  Allergies  Allergen Reactions  . Latex Itching and Rash  . Codeine   . Sulfa Antibiotics    Past Medical History:  Diagnosis Date  . Chronic fatigue   . Depression   . Fibromyalgia   . IBS (irritable bowel syndrome)   . Migraine   . Thyroid disease     Current Outpatient Medications:  .  acetaminophen (TYLENOL) 500 MG tablet, Take by mouth., Disp: , Rfl:  .  buPROPion (WELLBUTRIN XL) 300 MG 24 hr tablet, Take 1 tablet (300 mg total) by mouth daily., Disp: 30 tablet, Rfl: 11 .  clonazePAM (KLONOPIN) 0.5 MG tablet, Take 0.5 mg by mouth 2 (two) times daily. , Disp: , Rfl:  .  cycloSPORINE (RESTASIS) 0.05 % ophthalmic emulsion, Restasis 0.05 % eye drops in a dropperette  USE 1 DROP INTO BOTH EYES TWICE A DAY, Disp: , Rfl:  .  diclofenac (VOLTAREN) 75 MG EC tablet, Take 1 tablet (75 mg total) by mouth 2 (two) times daily., Disp: 180 tablet, Rfl: 3 .  eletriptan (RELPAX) 40 MG tablet, Take by mouth., Disp: , Rfl:  .  estradiol (MINIVELLE) 0.1 MG/24HR patch, Place 1 patch (0.1 mg total) onto the  skin 2 (two) times a week., Disp: 8 patch, Rfl:  .  HYDROcodone-acetaminophen (NORCO) 10-325 MG tablet, as needed., Disp: , Rfl:  .  imipramine (TOFRANIL) 10 MG tablet, Take 3 tablets (30 mg total) by mouth at bedtime., Disp: 90 tablet, Rfl: 11 .  levothyroxine (SYNTHROID) 50 MCG tablet, TAKE 1 TABLET BY MOUTH ONCE DAILY BEFORE BREAKFAST, Disp: 90 tablet, Rfl: 3 .  methocarbamol (ROBAXIN) 500 MG tablet, Take 1 tablet (500 mg total) by mouth every 6 (six) hours as needed for muscle spasms., Disp: 30 tablet, Rfl: 5 .  mometasone (NASONEX) 50 MCG/ACT nasal spray, PLACE 2 SPRAYS INTO THE NOSE DAILY., Disp: 51 g, Rfl: 1 .  Multiple Vitamins-Minerals (MULTIVITAMIN PO), Take by mouth., Disp: , Rfl:  .  omeprazole (PRILOSEC) 20 MG capsule, Take 1 capsule (20 mg total) by mouth daily., Disp: 90 capsule, Rfl: 3 .  valACYclovir (VALTREX) 500 MG tablet, , Disp: , Rfl:   Assessment/ Plan: 53 y.o. female   1. Acute non-recurrent frontal sinusitis - meclizine (ANTIVERT) 25 MG tablet; Take 1 tablet (25 mg total) by mouth 3 (three) times daily as needed for dizziness.  Dispense: 30 tablet; Refill: 0 - albuterol (VENTOLIN HFA) 108 (90 Base) MCG/ACT inhaler; Inhale 2 puffs into the lungs every 6 (six) hours as needed for wheezing or  shortness of breath.  Dispense: 18 g; Refill: 0 - amoxicillin (AMOXIL) 500 MG capsule; Take 1 capsule (500 mg total) by mouth 3 (three) times daily.  Dispense: 30 capsule; Refill: 0 - predniSONE (STERAPRED UNI-PAK 21 TAB) 10 MG (21) TBPK tablet; As directed x 6 days  Dispense: 21 tablet; Refill: 0 - fluconazole (DIFLUCAN) 150 MG tablet; Take 1 tablet (150 mg total) by mouth once for 1 dose. 1 po q week x 4 weeks  Dispense: 1 tablet; Refill: 0   No follow-ups on file.  Continue all other maintenance medications as listed above.  Start time: 3:11 PM End time: 3:22 PM  No orders of the defined types were placed in this encounter.   Particia Nearing PA-C [place] [phone]

## 2019-09-03 DIAGNOSIS — Z79890 Hormone replacement therapy (postmenopausal): Secondary | ICD-10-CM | POA: Diagnosis not present

## 2019-09-05 DIAGNOSIS — Z1239 Encounter for other screening for malignant neoplasm of breast: Secondary | ICD-10-CM | POA: Diagnosis not present

## 2019-09-05 DIAGNOSIS — Z9189 Other specified personal risk factors, not elsewhere classified: Secondary | ICD-10-CM | POA: Diagnosis not present

## 2019-09-05 DIAGNOSIS — Z86000 Personal history of in-situ neoplasm of breast: Secondary | ICD-10-CM | POA: Diagnosis not present

## 2019-09-05 DIAGNOSIS — R922 Inconclusive mammogram: Secondary | ICD-10-CM | POA: Diagnosis not present

## 2019-09-20 DIAGNOSIS — F411 Generalized anxiety disorder: Secondary | ICD-10-CM | POA: Diagnosis not present

## 2019-09-20 DIAGNOSIS — R69 Illness, unspecified: Secondary | ICD-10-CM | POA: Diagnosis not present

## 2019-09-28 ENCOUNTER — Other Ambulatory Visit: Payer: Self-pay | Admitting: Physician Assistant

## 2019-10-02 ENCOUNTER — Encounter: Payer: Self-pay | Admitting: Physician Assistant

## 2019-10-02 ENCOUNTER — Ambulatory Visit (INDEPENDENT_AMBULATORY_CARE_PROVIDER_SITE_OTHER): Payer: Medicare HMO | Admitting: Physician Assistant

## 2019-10-02 DIAGNOSIS — R232 Flushing: Secondary | ICD-10-CM

## 2019-10-02 DIAGNOSIS — Z780 Asymptomatic menopausal state: Secondary | ICD-10-CM | POA: Diagnosis not present

## 2019-10-02 MED ORDER — GABAPENTIN 100 MG PO CAPS: 100.0000 mg | ORAL_CAPSULE | Freq: Every day | ORAL | 3 refills | Status: DC

## 2019-10-02 NOTE — Progress Notes (Signed)
202      Telephone visit  Subjective: CC: Hot flashes secondary to menopause PCP: Terald Sleeper, PA-C [id number] Ruth Hopkins is a 54 y.o. female calls for telephone consult today. Patient provides verbal consent for consult held via phone.  Patient is identified with 2 separate identifiers.  At this time the entire area is on COVID-19 social distancing and stay home orders are in place.  Patient is of higher risk and therefore we are performing this by a virtual method.  Location of patient: Home Location of provider: HOME Others present for call: No  This patient is having appointment to discuss her hot flashes.  Her gynecologist discontinued her estrogen patch.  She had had breast cancer in the past and they had try to use it for some time but now they really want her to discontinue it altogether.  Of course she has had hot flashes return.  She states that we have tried some gabapentin in the past but she did not stay with it for very long she thought she may have gained weight.  She is willing to try it again and see how it does.  We will send a prescription in.  She will look and see if she has any on hand at her house and if they are out of date.  And if not the prescription will be at the pharmacy.  If things do not improve please give [place] a call back.   ROS: Per HPI  Allergies  Allergen Reactions  . Latex Itching and Rash  . Codeine   . Sulfa Antibiotics    Past Medical History:  Diagnosis Date  . Chronic fatigue   . Depression   . Fibromyalgia   . IBS (irritable bowel syndrome)   . Migraine   . Thyroid disease     Current Outpatient Medications:  .  acetaminophen (TYLENOL) 500 MG tablet, Take by mouth., Disp: , Rfl:  .  albuterol (VENTOLIN HFA) 108 (90 Base) MCG/ACT inhaler, Inhale 2 puffs into the lungs every 6 (six) hours as needed for wheezing or shortness of breath., Disp: 18 g, Rfl: 0 .  buPROPion (WELLBUTRIN XL) 300 MG 24 hr tablet, Take 1 tablet (300 mg total) by  mouth daily., Disp: 30 tablet, Rfl: 11 .  clonazePAM (KLONOPIN) 0.5 MG tablet, Take 0.5 mg by mouth 2 (two) times daily. , Disp: , Rfl:  .  cycloSPORINE (RESTASIS) 0.05 % ophthalmic emulsion, Restasis 0.05 % eye drops in a dropperette  USE 1 DROP INTO BOTH EYES TWICE A DAY, Disp: , Rfl:  .  diclofenac (VOLTAREN) 75 MG EC tablet, Take 1 tablet (75 mg total) by mouth 2 (two) times daily., Disp: 180 tablet, Rfl: 3 .  eletriptan (RELPAX) 40 MG tablet, TAKE 1 TABLET EVERY 2 HOURS AS NEEDED FOR MIGRAINE, UP TO 6 TABLETS IN 30 DAYS, Disp: 12 tablet, Rfl: 0 .  gabapentin (NEURONTIN) 100 MG capsule, Take 1-3 capsules (100-300 mg total) by mouth at bedtime., Disp: 90 capsule, Rfl: 3 .  HYDROcodone-acetaminophen (NORCO) 10-325 MG tablet, as needed., Disp: , Rfl:  .  imipramine (TOFRANIL) 10 MG tablet, Take 3 tablets (30 mg total) by mouth at bedtime., Disp: 90 tablet, Rfl: 11 .  levothyroxine (SYNTHROID) 50 MCG tablet, TAKE 1 TABLET BY MOUTH ONCE DAILY BEFORE BREAKFAST, Disp: 90 tablet, Rfl: 3 .  meclizine (ANTIVERT) 25 MG tablet, Take 1 tablet (25 mg total) by mouth 3 (three) times daily as needed for dizziness., Disp: 30 tablet,  Rfl: 0 .  methocarbamol (ROBAXIN) 500 MG tablet, Take 1 tablet (500 mg total) by mouth every 6 (six) hours as needed for muscle spasms., Disp: 30 tablet, Rfl: 5 .  mometasone (NASONEX) 50 MCG/ACT nasal spray, PLACE 2 SPRAYS INTO THE NOSE DAILY., Disp: 51 g, Rfl: 1 .  Multiple Vitamins-Minerals (MULTIVITAMIN PO), Take by mouth., Disp: , Rfl:  .  omeprazole (PRILOSEC) 20 MG capsule, Take 1 capsule (20 mg total) by mouth daily., Disp: 90 capsule, Rfl: 3 .  valACYclovir (VALTREX) 500 MG tablet, , Disp: , Rfl:   Assessment/ Plan: 54 y.o. female   1. Menopause - gabapentin (NEURONTIN) 100 MG capsule; Take 1-3 capsules (100-300 mg total) by mouth at bedtime.  Dispense: 90 capsule; Refill: 3  2. Hot flashes - gabapentin (NEURONTIN) 100 MG capsule; Take 1-3 capsules (100-300 mg total)  by mouth at bedtime.  Dispense: 90 capsule; Refill: 3   No follow-ups on file.  Continue all other maintenance medications as listed above.  Start time: 2:02 PM End time: 2:11 PM  Meds ordered this encounter  Medications  . gabapentin (NEURONTIN) 100 MG capsule    Sig: Take 1-3 capsules (100-300 mg total) by mouth at bedtime.    Dispense:  90 capsule    Refill:  3    Patient will call when she id ready to get    Order Specific Question:   Supervising Provider    Answer:   Janora Norlander [id number]    Particia Nearing PA-C [employer] [phone]

## 2019-10-08 ENCOUNTER — Telehealth: Payer: Self-pay | Admitting: Physician Assistant

## 2019-10-08 NOTE — Telephone Encounter (Signed)
Pt states she is unsure if the gabapentin is causing her increased fatigue although she took only 1 dose of it 3 days ago. Pt states she might try it again tonight and see what happens and she will follow up with AJ about it during her appt next week.

## 2019-10-17 ENCOUNTER — Ambulatory Visit (INDEPENDENT_AMBULATORY_CARE_PROVIDER_SITE_OTHER): Payer: Medicare HMO | Admitting: Physician Assistant

## 2019-10-17 DIAGNOSIS — F339 Major depressive disorder, recurrent, unspecified: Secondary | ICD-10-CM

## 2019-10-17 DIAGNOSIS — M797 Fibromyalgia: Secondary | ICD-10-CM | POA: Diagnosis not present

## 2019-10-17 DIAGNOSIS — Z780 Asymptomatic menopausal state: Secondary | ICD-10-CM

## 2019-10-17 DIAGNOSIS — R69 Illness, unspecified: Secondary | ICD-10-CM | POA: Diagnosis not present

## 2019-10-17 MED ORDER — ESCITALOPRAM OXALATE 5 MG PO TABS: 5.0000 mg | ORAL_TABLET | Freq: Every day | ORAL | 2 refills | Status: DC

## 2019-10-17 NOTE — Progress Notes (Signed)
Telephone visit  Subjective: CC:hot flashes, depresiion PCP: Terald Sleeper, PA-C [id number] [name] is a 54 y.o. female calls for telephone consult today. Patient provides verbal consent for consult held via phone.  Patient is identified with 2 separate identifiers.  At this time the entire area is on COVID-19 social distancing and stay home orders are in place.  Patient is of higher risk and therefore we are performing this by a virtual method.  Location of patient: home Location of provider: [facility] Others present for call: no  The patientis having continued hot flashes on the gabapentin and continues to feel tired on the medication. She thought that it would get better with with time bu ut has not. She notes that it did not even make a difference with the hot flashing. She has had interrupted sleep and they occur through the day. Discussed that SSRI medications are also a recommended medication and she is up for it. Has had history of depression and fibromyalgia   ROS: Per HPI  Allergies  Allergen Reactions  . Latex Itching and Rash  . Codeine   . Gabapentin     "fuzzy"  . Sulfa Antibiotics    Past Medical History:  Diagnosis Date  . Chronic fatigue   . Depression   . Fibromyalgia   . IBS (irritable bowel syndrome)   . Migraine   . Thyroid disease     Current Outpatient Medications:  .  acetaminophen (TYLENOL) 500 MG tablet, Take by mouth., Disp: , Rfl:  .  albuterol (VENTOLIN HFA) 108 (90 Base) MCG/ACT inhaler, Inhale 2 puffs into the lungs every 6 (six) hours as needed for wheezing or shortness of breath., Disp: 18 g, Rfl: 0 .  buPROPion (WELLBUTRIN XL) 300 MG 24 hr tablet, Take 1 tablet (300 mg total) by mouth daily., Disp: 30 tablet, Rfl: 11 .  clonazePAM (KLONOPIN) 0.5 MG tablet, Take 0.5 mg by mouth 2 (two) times daily. , Disp: , Rfl:  .  cycloSPORINE (RESTASIS) 0.05 % ophthalmic emulsion, Restasis 0.05 % eye drops in a dropperette  USE 1 DROP INTO BOTH EYES  TWICE A DAY, Disp: , Rfl:  .  diclofenac (VOLTAREN) 75 MG EC tablet, Take 1 tablet (75 mg total) by mouth 2 (two) times daily., Disp: 180 tablet, Rfl: 3 .  eletriptan (RELPAX) 40 MG tablet, TAKE 1 TABLET EVERY 2 HOURS AS NEEDED FOR MIGRAINE, UP TO 6 TABLETS IN 30 DAYS, Disp: 12 tablet, Rfl: 0 .  escitalopram (LEXAPRO) 5 MG tablet, Take 1 tablet (5 mg total) by mouth daily., Disp: 30 tablet, Rfl: 2 .  HYDROcodone-acetaminophen (NORCO) 10-325 MG tablet, as needed., Disp: , Rfl:  .  imipramine (TOFRANIL) 10 MG tablet, Take 3 tablets (30 mg total) by mouth at bedtime., Disp: 90 tablet, Rfl: 11 .  levothyroxine (SYNTHROID) 50 MCG tablet, TAKE 1 TABLET BY MOUTH ONCE DAILY BEFORE BREAKFAST, Disp: 90 tablet, Rfl: 3 .  meclizine (ANTIVERT) 25 MG tablet, Take 1 tablet (25 mg total) by mouth 3 (three) times daily as needed for dizziness., Disp: 30 tablet, Rfl: 0 .  methocarbamol (ROBAXIN) 500 MG tablet, Take 1 tablet (500 mg total) by mouth every 6 (six) hours as needed for muscle spasms., Disp: 30 tablet, Rfl: 5 .  mometasone (NASONEX) 50 MCG/ACT nasal spray, PLACE 2 SPRAYS INTO THE NOSE DAILY., Disp: 51 g, Rfl: 1 .  Multiple Vitamins-Minerals (MULTIVITAMIN PO), Take by mouth., Disp: , Rfl:  .  omeprazole (PRILOSEC) 20 MG  capsule, Take 1 capsule (20 mg total) by mouth daily., Disp: 90 capsule, Rfl: 3 .  valACYclovir (VALTREX) 500 MG tablet, , Disp: , Rfl:   Assessment/ Plan: 54 y.o. female   1. Fibromyalgia - escitalopram (LEXAPRO) 5 MG tablet; Take 1 tablet (5 mg total) by mouth daily.  Dispense: 30 tablet; Refill: 2  2. Depression, recurrent ([place]) - escitalopram (LEXAPRO) 5 MG tablet; Take 1 tablet (5 mg total) by mouth daily.  Dispense: 30 tablet; Refill: 2  3. Menopause lexapro for the hot flashes   No follow-ups on file.  Continue all other maintenance medications as listed above.  Start time: 11:55 Am End time: 12:13PM  Meds ordered this encounter  Medications  . escitalopram  (LEXAPRO) 5 MG tablet    Sig: Take 1 tablet (5 mg total) by mouth daily.    Dispense:  30 tablet    Refill:  2    Order Specific Question:   Supervising Provider    Answer:   Janora Norlander [id number]    Particia Nearing PA-C [place] [phone]

## 2019-10-18 ENCOUNTER — Ambulatory Visit: Payer: Medicare HMO | Admitting: Podiatry

## 2019-10-21 ENCOUNTER — Encounter: Payer: Self-pay | Admitting: Physician Assistant

## 2019-10-21 DIAGNOSIS — Z780 Asymptomatic menopausal state: Secondary | ICD-10-CM | POA: Insufficient documentation

## 2019-10-23 ENCOUNTER — Other Ambulatory Visit: Payer: Self-pay | Admitting: Physician Assistant

## 2019-10-23 DIAGNOSIS — Z1231 Encounter for screening mammogram for malignant neoplasm of breast: Secondary | ICD-10-CM | POA: Diagnosis not present

## 2019-10-23 DIAGNOSIS — Z9189 Other specified personal risk factors, not elsewhere classified: Secondary | ICD-10-CM | POA: Diagnosis not present

## 2019-10-23 DIAGNOSIS — Z86000 Personal history of in-situ neoplasm of breast: Secondary | ICD-10-CM | POA: Diagnosis not present

## 2019-10-30 DIAGNOSIS — R69 Illness, unspecified: Secondary | ICD-10-CM | POA: Diagnosis not present

## 2019-11-05 ENCOUNTER — Ambulatory Visit: Payer: Medicare HMO | Admitting: Podiatry

## 2019-11-05 ENCOUNTER — Encounter: Payer: Self-pay | Admitting: Podiatry

## 2019-11-05 ENCOUNTER — Other Ambulatory Visit: Payer: Self-pay

## 2019-11-05 VITALS — Temp 97.2°F

## 2019-11-05 DIAGNOSIS — M779 Enthesopathy, unspecified: Secondary | ICD-10-CM

## 2019-11-05 DIAGNOSIS — F411 Generalized anxiety disorder: Secondary | ICD-10-CM | POA: Diagnosis not present

## 2019-11-05 DIAGNOSIS — R69 Illness, unspecified: Secondary | ICD-10-CM | POA: Diagnosis not present

## 2019-11-06 ENCOUNTER — Encounter: Payer: Self-pay | Admitting: Physician Assistant

## 2019-11-06 ENCOUNTER — Ambulatory Visit (INDEPENDENT_AMBULATORY_CARE_PROVIDER_SITE_OTHER): Payer: Medicare HMO | Admitting: Physician Assistant

## 2019-11-06 DIAGNOSIS — J0110 Acute frontal sinusitis, unspecified: Secondary | ICD-10-CM

## 2019-11-06 MED ORDER — AMOXICILLIN 500 MG PO CAPS: 500.0000 mg | ORAL_CAPSULE | Freq: Three times a day (TID) | ORAL | 0 refills | Status: DC

## 2019-11-06 NOTE — Progress Notes (Signed)
Telephone visit  Subjective: [id number] pain PCP: Terald Sleeper, PA-C [id number] H Palas is a 54 y.o. female calls for telephone consult today. Patient provides verbal consent for consult held via phone.  Patient is identified with 2 separate identifiers.  At this time the entire area is on COVID-19 social distancing and stay home orders are in place.  Patient is of higher risk and therefore we are performing this by a virtual method.  Location of patient: home Location of provider: HOME Others present for call: no  This patient has had many days of sinus headache and postnasal drainage. There is copious drainage at times. Denies any fever at this time. There has been a history of sinus infections in the past.  No history of sinus surgery. There is cough at night. It has become more prevalent in recent days.    ROS: Per HPI  Allergies  Allergen Reactions  . Latex Itching and Rash  . Codeine   . Gabapentin     "fuzzy"  . Sulfa Antibiotics    Past Medical History:  Diagnosis Date  . Chronic fatigue   . Depression   . Fibromyalgia   . IBS (irritable bowel syndrome)   . Migraine   . Thyroid disease     Current Outpatient Medications:  .  acetaminophen (TYLENOL) 500 MG tablet, Take by mouth., Disp: , Rfl:  .  albuterol (VENTOLIN HFA) 108 (90 Base) MCG/ACT inhaler, Inhale 2 puffs into the lungs every 6 (six) hours as needed for wheezing or shortness of breath., Disp: 18 g, Rfl: 0 .  amoxicillin (AMOXIL) 500 MG capsule, Take 1 capsule (500 mg total) by mouth 3 (three) times daily., Disp: 30 capsule, Rfl: 0 .  buPROPion (WELLBUTRIN XL) 300 MG 24 hr tablet, Take 1 tablet (300 mg total) by mouth daily., Disp: 30 tablet, Rfl: 11 .  clonazePAM (KLONOPIN) 0.5 MG tablet, Take 0.5 mg by mouth 2 (two) times daily. , Disp: , Rfl:  .  cycloSPORINE (RESTASIS) 0.05 % ophthalmic emulsion, Restasis 0.05 % eye drops in a dropperette  USE 1 DROP INTO BOTH EYES TWICE A DAY, Disp: , Rfl:  .   diclofenac (VOLTAREN) 75 MG EC tablet, Take 1 tablet (75 mg total) by mouth 2 (two) times daily., Disp: 180 tablet, Rfl: 3 .  eletriptan (RELPAX) 40 MG tablet, TAKE 1 TABLET EVERY 2 HOURS AS NEEDED FOR MIGRAINE, UP TO 6 TABLETS IN 30 DAYS, Disp: 12 tablet, Rfl: 0 .  HYDROcodone-acetaminophen (NORCO) 10-325 MG tablet, as needed., Disp: , Rfl:  .  imipramine (TOFRANIL) 10 MG tablet, Take 3 tablets (30 mg total) by mouth at bedtime., Disp: 90 tablet, Rfl: 11 .  levothyroxine (SYNTHROID) 50 MCG tablet, TAKE 1 TABLET BY MOUTH ONCE DAILY BEFORE BREAKFAST, Disp: 90 tablet, Rfl: 3 .  meclizine (ANTIVERT) 25 MG tablet, Take 1 tablet (25 mg total) by mouth 3 (three) times daily as needed for dizziness., Disp: 30 tablet, Rfl: 0 .  methocarbamol (ROBAXIN) 500 MG tablet, Take 1 tablet (500 mg total) by mouth every 6 (six) hours as needed for muscle spasms., Disp: 30 tablet, Rfl: 5 .  mometasone (NASONEX) 50 MCG/ACT nasal spray, PLACE 2 SPRAYS INTO THE NOSE DAILY., Disp: 51 g, Rfl: 1 .  Multiple Vitamins-Minerals (MULTIVITAMIN PO), Take by mouth., Disp: , Rfl:  .  omeprazole (PRILOSEC) 20 MG capsule, Take 1 capsule (20 mg total) by mouth daily., Disp: 90 capsule, Rfl: 3 .  valACYclovir (VALTREX) 500  MG tablet, , Disp: , Rfl:   Assessment/ Plan: 54 y.o. female   1. Acute non-recurrent frontal sinusitis - amoxicillin (AMOXIL) 500 MG capsule; Take 1 capsule (500 mg total) by mouth 3 (three) times daily.  Dispense: 30 capsule; Refill: 0   Return in about 6 months (around 05/05/2020).  Continue all other maintenance medications as listed above.  Start time: 8:31 AM End time: 8:40 AM  Meds ordered this encounter  Medications  . amoxicillin (AMOXIL) 500 MG capsule    Sig: Take 1 capsule (500 mg total) by mouth 3 (three) times daily.    Dispense:  30 capsule    Refill:  0    Order Specific Question:   Supervising Provider    Answer:   Janora Norlander [id number]    Particia Nearing PA-C [place] [phone]

## 2019-11-06 NOTE — Progress Notes (Signed)
Subjective:   Patient ID: Ruth Hopkins, female   DOB: 54 y.o.   MRN: [id number]   HPI Patient presents stating that I am getting a lot of pain in my joints of both feet   ROS      Objective:  Physical Exam  Neurovascular status intact with inflammatory capsulitis sinus tarsi bilateral     Assessment:  Chronic sinus tarsitis bilateral     Plan:  Sterile prep done injected the sinus tarsi bilateral 3 mg Kenalog 5 mg Xylocaine and advised this patient on continued conservative treatment

## 2019-11-15 DIAGNOSIS — Z79890 Hormone replacement therapy (postmenopausal): Secondary | ICD-10-CM | POA: Diagnosis not present

## 2019-11-15 DIAGNOSIS — N951 Menopausal and female climacteric states: Secondary | ICD-10-CM | POA: Diagnosis not present

## 2019-11-28 DIAGNOSIS — L821 Other seborrheic keratosis: Secondary | ICD-10-CM | POA: Diagnosis not present

## 2019-11-28 DIAGNOSIS — D229 Melanocytic nevi, unspecified: Secondary | ICD-10-CM | POA: Diagnosis not present

## 2019-11-28 DIAGNOSIS — L720 Epidermal cyst: Secondary | ICD-10-CM | POA: Diagnosis not present

## 2019-12-24 ENCOUNTER — Telehealth: Payer: Self-pay | Admitting: Family

## 2019-12-24 NOTE — Telephone Encounter (Signed)
Appointment is scheduled for May with new provider,(Jone's patient).  Could she have  a refill to cover her until the appointment.please?

## 2019-12-24 NOTE — Telephone Encounter (Signed)
  Prescription Request  12/24/2019  What is the name of the medication or equipment? diclofenac (VOLTAREN) 75 MG EC tablet    Have you contacted your pharmacy to request a refill? (if applicable) yes  Which pharmacy would you like this sent to? [facility]   Patient notified that their request is being sent to the clinical staff for review and that they should receive a response within 2 business days.

## 2019-12-25 MED ORDER — DICLOFENAC SODIUM 75 MG PO TBEC: 75.0000 mg | DELAYED_RELEASE_TABLET | Freq: Two times a day (BID) | ORAL | 3 refills | Status: DC

## 2019-12-25 NOTE — Telephone Encounter (Signed)
Prescription sent to pharmacy.

## 2019-12-28 ENCOUNTER — Other Ambulatory Visit: Payer: Self-pay | Admitting: *Deleted

## 2019-12-31 MED ORDER — ELETRIPTAN HYDROBROMIDE 40 MG PO TABS: ORAL_TABLET | ORAL | 0 refills | Status: DC

## 2020-01-02 ENCOUNTER — Ambulatory Visit: Payer: Medicare HMO

## 2020-01-07 ENCOUNTER — Ambulatory Visit: Payer: Medicare HMO

## 2020-01-07 DIAGNOSIS — Z30431 Encounter for routine checking of intrauterine contraceptive device: Secondary | ICD-10-CM | POA: Diagnosis not present

## 2020-01-09 DIAGNOSIS — R69 Illness, unspecified: Secondary | ICD-10-CM | POA: Diagnosis not present

## 2020-01-09 DIAGNOSIS — F411 Generalized anxiety disorder: Secondary | ICD-10-CM | POA: Diagnosis not present

## 2020-01-29 ENCOUNTER — Ambulatory Visit: Payer: Medicare HMO | Admitting: Physician Assistant

## 2020-01-31 ENCOUNTER — Ambulatory Visit (INDEPENDENT_AMBULATORY_CARE_PROVIDER_SITE_OTHER): Payer: Medicare HMO | Admitting: Family

## 2020-01-31 ENCOUNTER — Encounter: Payer: Self-pay | Admitting: Family

## 2020-01-31 ENCOUNTER — Other Ambulatory Visit: Payer: Self-pay

## 2020-01-31 DIAGNOSIS — F419 Anxiety disorder, unspecified: Secondary | ICD-10-CM

## 2020-01-31 DIAGNOSIS — R69 Illness, unspecified: Secondary | ICD-10-CM | POA: Diagnosis not present

## 2020-01-31 DIAGNOSIS — R531 Weakness: Secondary | ICD-10-CM

## 2020-01-31 DIAGNOSIS — R350 Frequency of micturition: Secondary | ICD-10-CM | POA: Diagnosis not present

## 2020-01-31 DIAGNOSIS — R232 Flushing: Secondary | ICD-10-CM

## 2020-01-31 DIAGNOSIS — R55 Syncope and collapse: Secondary | ICD-10-CM | POA: Diagnosis not present

## 2020-01-31 LAB — URINALYSIS, COMPLETE
Bilirubin, UA: NEGATIVE
Glucose, UA: NEGATIVE
Ketones, UA: NEGATIVE
Leukocytes,UA: NEGATIVE
Nitrite, UA: NEGATIVE
Protein,UA: NEGATIVE
Specific Gravity, UA: 1.02 (ref 1.005–1.030)
Urobilinogen, Ur: 0.2 mg/dL (ref 0.2–1.0)
pH, UA: 6.5 (ref 5.0–7.5)

## 2020-01-31 LAB — MICROSCOPIC EXAMINATION
Epithelial Cells (non renal): >10 /hpf — AB (ref 0–10)
Renal Epithel, UA: NONE SEEN /hpf

## 2020-01-31 NOTE — Progress Notes (Signed)
Virtual Visit via telephone Note Due to COVID-19 pandemic this visit was conducted virtually. This visit type was conducted due to national recommendations for restrictions regarding the COVID-19 Pandemic (e.g. social distancing, sheltering in place) in an effort to limit this patient's exposure and mitigate transmission in our community. All issues noted in this document were discussed and addressed.  A physical exam was not performed with this format.  I connected with Ruth Hopkins on 01/31/20 at 11:58 AM by telephone and verified that I am speaking with the correct person using two identifiers. Ruth Hopkins is currently located at home and no one is currently with her during visit. The provider, Evelina Dun, FNP is located in their office at time of visit.  I discussed the limitations, risks, security and privacy concerns of performing an evaluation and management service by telephone and the availability of in person appointments. I also discussed with the patient that there may be a patient responsible charge related to this service. The patient expressed understanding and agreed to proceed.   History and Present Illness:  HPI Pt calls the office today with weakness that started two days ago. She reports it started a hour after starting the Effexor 37.5 mg. She reports her psychiatrist placed her on this for Hot flashes.  She reports she has tried gabapentin and lexapro that did not help. She is currently taking Estradiol patch.   She does report her weakness is better today than yesterday. She does report urinary frequency, urgency. Denies any dysuria or fevers. She does report she started Keflex last night.  She reports she has these episodes of feeling like she was going to pass out. States she was sitting and it occurred. She does reports she was very anxious. After laying down, it resolved.    Review of Systems  Constitutional: Positive for malaise/fatigue.  All other systems  reviewed and are negative.    Observations/Objective: No SOB or distress noted, pt sounds cheerful   Assessment and Plan: Ruth Hopkins comes in today with chief complaint of No chief complaint on file.   Diagnosis and orders addressed:  1. Weakness - CBC with Differential/Platelet - BMP8+EGFR - TSH  2. Urinary frequency - Urinalysis, Complete - Urine Culture - CBC with Differential/Platelet  3. Hot flashes  4. Near syncope  5. Anxiety   Labs pending PT will stop Effexor and let [facility] know. Labs pending to rule out other causes. If the near syncope happens again will need EKG.  Keep follow up with [facility] and exercise encouraged       I discussed the assessment and treatment plan with the patient. The patient was provided an opportunity to ask questions and all were answered. The patient agreed with the plan and demonstrated an understanding of the instructions.   The patient was advised to call back or seek an in-person evaluation if the symptoms worsen or if the condition fails to improve as anticipated.  The above assessment and management plan was discussed with the patient. The patient verbalized understanding of and has agreed to the management plan. Patient is aware to call the clinic if symptoms persist or worsen. Patient is aware when to return to the clinic for a follow-up visit. Patient educated on when it is appropriate to go to the emergency department.   Time call ended:  12:18 pm   I provided 20 minutes of non-face-to-face time during this encounter.    Evelina Dun, FNP

## 2020-02-01 LAB — CBC WITH DIFFERENTIAL/PLATELET
Basophils Absolute: 0.1 10*3/uL (ref 0.0–0.2)
Basos: 1 %
EOS (ABSOLUTE): 0.1 10*3/uL (ref 0.0–0.4)
Eos: 2 %
Hematocrit: 41.9 % (ref 34.0–46.6)
Hemoglobin: 13.5 g/dL (ref 11.1–15.9)
Immature Grans (Abs): 0 10*3/uL (ref 0.0–0.1)
Immature Granulocytes: 0 %
Lymphocytes Absolute: 1.4 10*3/uL (ref 0.7–3.1)
Lymphs: 26 %
MCH: 28.8 pg (ref 26.6–33.0)
MCHC: 32.2 g/dL (ref 31.5–35.7)
MCV: 90 fL (ref 79–97)
Monocytes Absolute: 0.3 10*3/uL (ref 0.1–0.9)
Monocytes: 5 %
Neutrophils Absolute: 3.4 10*3/uL (ref 1.4–7.0)
Neutrophils: 66 %
Platelets: 209 10*3/uL (ref 150–450)
RBC: 4.68 x10E6/uL (ref 3.77–5.28)
RDW: 11.7 % (ref 11.7–15.4)
WBC: 5.3 10*3/uL (ref 3.4–10.8)

## 2020-02-01 LAB — BMP8+EGFR
BUN/Creatinine Ratio: 28 — ABNORMAL HIGH (ref 9–23)
BUN: 21 mg/dL (ref 6–24)
CO2: 24 mmol/L (ref 20–29)
Calcium: 9 mg/dL (ref 8.7–10.2)
Chloride: 104 mmol/L (ref 96–106)
Creatinine, Ser: 0.74 mg/dL (ref 0.57–1.00)
GFR calc Af Amer: 106 mL/min/{1.73_m2} (ref >59)
GFR calc non Af Amer: 92 mL/min/{1.73_m2} (ref >59)
Glucose: 91 mg/dL (ref 65–99)
Potassium: 4.3 mmol/L (ref 3.5–5.2)
Sodium: 142 mmol/L (ref 134–144)

## 2020-02-01 LAB — TSH: TSH: 1.69 u[IU]/mL (ref 0.450–4.500)

## 2020-02-02 LAB — URINE CULTURE

## 2020-02-05 ENCOUNTER — Ambulatory Visit: Payer: Medicare HMO | Admitting: Physician Assistant

## 2020-02-07 ENCOUNTER — Ambulatory Visit: Payer: Medicare HMO | Admitting: Physician Assistant

## 2020-02-08 ENCOUNTER — Ambulatory Visit (INDEPENDENT_AMBULATORY_CARE_PROVIDER_SITE_OTHER): Payer: Medicare HMO | Admitting: Family

## 2020-02-08 ENCOUNTER — Other Ambulatory Visit: Payer: Self-pay

## 2020-02-08 ENCOUNTER — Encounter: Payer: Self-pay | Admitting: Family

## 2020-02-08 VITALS — BP 110/73 | HR 70 | Temp 98.2°F | Ht 64.0 in | Wt 130.2 lb

## 2020-02-08 DIAGNOSIS — Z79899 Other long term (current) drug therapy: Secondary | ICD-10-CM

## 2020-02-08 DIAGNOSIS — E039 Hypothyroidism, unspecified: Secondary | ICD-10-CM

## 2020-02-08 DIAGNOSIS — M797 Fibromyalgia: Secondary | ICD-10-CM

## 2020-02-08 DIAGNOSIS — G43901 Migraine, unspecified, not intractable, with status migrainosus: Secondary | ICD-10-CM | POA: Diagnosis not present

## 2020-02-08 DIAGNOSIS — F419 Anxiety disorder, unspecified: Secondary | ICD-10-CM

## 2020-02-08 DIAGNOSIS — R69 Illness, unspecified: Secondary | ICD-10-CM | POA: Diagnosis not present

## 2020-02-08 DIAGNOSIS — F339 Major depressive disorder, recurrent, unspecified: Secondary | ICD-10-CM | POA: Diagnosis not present

## 2020-02-08 DIAGNOSIS — F17200 Nicotine dependence, unspecified, uncomplicated: Secondary | ICD-10-CM

## 2020-02-08 MED ORDER — DICLOFENAC SODIUM 75 MG PO TBEC: 75.0000 mg | DELAYED_RELEASE_TABLET | Freq: Two times a day (BID) | ORAL | 3 refills | Status: DC

## 2020-02-08 MED ORDER — HYDROCODONE-ACETAMINOPHEN 5-325 MG PO TABS: 1.0000 | ORAL_TABLET | Freq: Four times a day (QID) | ORAL | 0 refills | Status: DC | PRN

## 2020-02-08 NOTE — Patient Instructions (Signed)
Health Maintenance, Female Adopting a healthy lifestyle and getting preventive care are important in promoting health and wellness. Ask your health care provider about:  The right schedule for you to have regular tests and exams.  Things you can do on your own to prevent diseases and keep yourself healthy. What should I know about diet, weight, and exercise? Eat a healthy diet   Eat a diet that includes plenty of vegetables, fruits, low-fat dairy products, and lean protein.  Do not eat a lot of foods that are high in solid fats, added sugars, or sodium. Maintain a healthy weight Body mass index (BMI) is used to identify weight problems. It estimates body fat based on height and weight. Your health care provider can help determine your BMI and help you achieve or maintain a healthy weight. Get regular exercise Get regular exercise. This is one of the most important things you can do for your health. Most adults should:  Exercise for at least 150 minutes each week. The exercise should increase your heart rate and make you sweat (moderate-intensity exercise).  Do strengthening exercises at least twice a week. This is in addition to the moderate-intensity exercise.  Spend less time sitting. Even light physical activity can be beneficial. Watch cholesterol and blood lipids Have your blood tested for lipids and cholesterol at 55 years of age, then have this test every 5 years. Have your cholesterol levels checked more often if:  Your lipid or cholesterol levels are high.  You are older than 55 years of age.  You are at high risk for heart disease. What should I know about cancer screening? Depending on your health history and family history, you may need to have cancer screening at various ages. This may include screening for:  Breast cancer.  Cervical cancer.  Colorectal cancer.  Skin cancer.  Lung cancer. What should I know about heart disease, diabetes, and high blood  pressure? Blood pressure and heart disease  High blood pressure causes heart disease and increases the risk of stroke. This is more likely to develop in people who have high blood pressure readings, are of African descent, or are overweight.  Have your blood pressure checked: ? Every 3-5 years if you are 18-39 years of age. ? Every year if you are 40 years old or older. Diabetes Have regular diabetes screenings. This checks your fasting blood sugar level. Have the screening done:  Once every three years after age 40 if you are at a normal weight and have a low risk for diabetes.  More often and at a younger age if you are overweight or have a high risk for diabetes. What should I know about preventing infection? Hepatitis B If you have a higher risk for hepatitis B, you should be screened for this virus. Talk with your health care provider to find out if you are at risk for hepatitis B infection. Hepatitis C Testing is recommended for:  Everyone born from 1945 through 1965.  Anyone with known risk factors for hepatitis C. Sexually transmitted infections (STIs)  Get screened for STIs, including gonorrhea and chlamydia, if: ? You are sexually active and are younger than 55 years of age. ? You are older than 55 years of age and your health care provider tells you that you are at risk for this type of infection. ? Your sexual activity has changed since you were last screened, and you are at increased risk for chlamydia or gonorrhea. Ask your health care provider if   you are at risk.  Ask your health care provider about whether you are at high risk for HIV. Your health care provider may recommend a prescription medicine to help prevent HIV infection. If you choose to take medicine to prevent HIV, you should first get tested for HIV. You should then be tested every 3 months for as long as you are taking the medicine. Pregnancy  If you are about to stop having your period (premenopausal) and  you may become pregnant, seek counseling before you get pregnant.  Take 400 to 800 micrograms (mcg) of folic acid every day if you become pregnant.  Ask for birth control (contraception) if you want to prevent pregnancy. Osteoporosis and menopause Osteoporosis is a disease in which the bones lose minerals and strength with aging. This can result in bone fractures. If you are 65 years old or older, or if you are at risk for osteoporosis and fractures, ask your health care provider if you should:  Be screened for bone loss.  Take a calcium or vitamin D supplement to lower your risk of fractures.  Be given hormone replacement therapy (HRT) to treat symptoms of menopause. Follow these instructions at home: Lifestyle  Do not use any products that contain nicotine or tobacco, such as cigarettes, e-cigarettes, and chewing tobacco. If you need help quitting, ask your health care provider.  Do not use street drugs.  Do not share needles.  Ask your health care provider for help if you need support or information about quitting drugs. Alcohol use  Do not drink alcohol if: ? Your health care provider tells you not to drink. ? You are pregnant, may be pregnant, or are planning to become pregnant.  If you drink alcohol: ? Limit how much you use to 0-1 drink a day. ? Limit intake if you are breastfeeding.  Be aware of how much alcohol is in your drink. In the [place] one drink equals one 12 oz bottle of beer (355 mL), one 5 oz glass of wine (148 mL), or one 1 oz glass of hard liquor (44 mL). General instructions  Schedule regular health, dental, and eye exams.  Stay current with your vaccines.  Tell your health care provider if: ? You often feel depressed. ? You have ever been abused or do not feel safe at home. Summary  Adopting a healthy lifestyle and getting preventive care are important in promoting health and wellness.  Follow your health care provider's instructions about healthy  diet, exercising, and getting tested or screened for diseases.  Follow your health care provider's instructions on monitoring your cholesterol and blood pressure. This information is not intended to replace advice given to you by your health care provider. Make sure you discuss any questions you have with your health care provider. Document Revised: 08/30/2018 Document Reviewed: 08/30/2018 Elsevier Patient Education  2020 Elsevier Inc.  

## 2020-02-08 NOTE — Progress Notes (Signed)
Subjective:    Patient ID: Ruth Hopkins, female    DOB: 02-16-1966, 54 y.o.   MRN: [id number]  Chief Complaint  Patient presents with  . Medical Management of Chronic Issues   PT presents to the office today for chronic follow up. She is followed by psychiatrist 3 months for hot flashes and GAD.She is currently taking estradiol patch and gradually decreasing and trying to add other medications. She has tried gabapentin, Lexapro, and Effexor without success.    Migraine  This is a chronic problem. The current episode started more than 1 year ago. The problem occurs intermittently. The problem has been waxing and waning. The pain is located in the left unilateral region. The pain is moderate. Associated symptoms include nausea and photophobia. Pertinent negatives include no phonophobia. The symptoms are aggravated by emotional stress and activity. She has tried darkened room and triptans for the symptoms. The treatment provided moderate relief. Her past medical history is significant for migraine headaches.  Thyroid Problem Presents for follow-up visit. Symptoms include constipation and fatigue. Patient reports no cold intolerance or depressed mood. The symptoms have been stable.  Nicotine Dependence Presents for follow-up visit. Symptoms include fatigue. Her urge triggers include company of smokers. She smokes < 1/2 a pack of cigarettes per day. Compliance with prior treatments has been good.  Fibromyalgia PT taking Norco as needed. She has not filled this since 05/23/19.    Review of Systems  Constitutional: Positive for fatigue.  Eyes: Positive for photophobia.  Gastrointestinal: Positive for constipation and nausea.  Endocrine: Negative for cold intolerance.  All other systems reviewed and are negative.      Objective:   Physical Exam Vitals reviewed.  Constitutional:      General: She is not in acute distress.    Appearance: She is well-developed.  HENT:     Head:  Normocephalic and atraumatic.     Right Ear: Tympanic membrane normal.     Left Ear: Tympanic membrane normal.  Eyes:     Pupils: Pupils are equal, round, and reactive to light.  Neck:     Thyroid: No thyromegaly.  Cardiovascular:     Rate and Rhythm: Normal rate and regular rhythm.     Heart sounds: Normal heart sounds. No murmur.  Pulmonary:     Effort: Pulmonary effort is normal. No respiratory distress.     Breath sounds: Normal breath sounds. No wheezing.  Abdominal:     General: Bowel sounds are normal. There is no distension.     Palpations: Abdomen is soft.     Tenderness: There is no abdominal tenderness.  Musculoskeletal:        General: No tenderness. Normal range of motion.     Cervical back: Normal range of motion and neck supple.  Skin:    General: Skin is warm and dry.  Neurological:     Mental Status: She is alert and oriented to person, place, and time.     Cranial Nerves: No cranial nerve deficit.     Deep Tendon Reflexes: Reflexes are normal and symmetric.  Psychiatric:        Behavior: Behavior normal.        Thought Content: Thought content normal.        Judgment: Judgment normal.       BP 110/73   Pulse 70   Temp 98.2 F (36.8 C) (Temporal)   Ht 5\' 4"  (1.626 m)   Wt 130 lb 3.2 oz (59.1 kg)  SpO2 100%   BMI 22.35 kg/m      Assessment & Plan:  HASSIE DENAPOLI comes in today with chief complaint of Medical Management of Chronic Issues   Diagnosis and orders addressed:  1. Migraine with status migrainosus, not intractable, unspecified migraine type  2. Acquired hypothyroidism  3. Anxiety  4. Depression, recurrent ([place])  5. Fibromyalgia - HYDROcodone-acetaminophen (NORCO) 5-325 MG tablet; Take 1 tablet by mouth every 6 (six) hours as needed for moderate pain.  Dispense: 30 tablet; Refill: 0  6. Smoker  7. Controlled substance agreement signed - [employer] ([place]), Urine   Labs reviewed Pt reviewed [place] control database, drug  screen and contract up dated today Health Maintenance reviewed Diet and exercise encouraged  Follow up plan: 6 months    Evelina Dun, FNP

## 2020-02-12 LAB — TOXASSURE SELECT 13 (MW), URINE

## 2020-02-13 ENCOUNTER — Encounter: Payer: Self-pay | Admitting: Podiatry

## 2020-02-13 ENCOUNTER — Ambulatory Visit (INDEPENDENT_AMBULATORY_CARE_PROVIDER_SITE_OTHER): Payer: Medicare HMO | Admitting: Podiatry

## 2020-02-13 ENCOUNTER — Other Ambulatory Visit: Payer: Self-pay

## 2020-02-13 VITALS — Temp 96.4°F

## 2020-02-13 DIAGNOSIS — M779 Enthesopathy, unspecified: Secondary | ICD-10-CM | POA: Diagnosis not present

## 2020-02-13 NOTE — Progress Notes (Signed)
Subjective:   Patient ID: Ruth Hopkins, female   DOB: 54 y.o.   MRN: [id number]   HPI Patient states the joints are really started to bother her again she would like them worked on   ROS      Objective:  Physical Exam  Vascular status intact with patient found to have inflammation of the sinus tarsi bilateral that are painful as well to treatment medication approximately every 4 months     Assessment:  Sinus tarsitis bilateral     Plan:  Sterile prep injected the sinus tarsi bilateral 3 mg milligrams Xylocaine and advised on continued range of motion exercises anti-inflammatories

## 2020-02-21 DIAGNOSIS — F411 Generalized anxiety disorder: Secondary | ICD-10-CM | POA: Diagnosis not present

## 2020-02-21 DIAGNOSIS — R69 Illness, unspecified: Secondary | ICD-10-CM | POA: Diagnosis not present

## 2020-03-10 ENCOUNTER — Telehealth: Payer: Self-pay | Admitting: Family

## 2020-03-10 NOTE — Telephone Encounter (Signed)
°  Prescription Request  03/10/2020  What is the name of the medication or equipment? Levothyroxine  Have you contacted your pharmacy to request a refill? (if applicable) Yes  Which pharmacy would you like this sent to? [facility], [place]  Pt had 6 mth ck with Christy on 02/08/20.   Patient notified that their request is being sent to the clinical staff for review and that they should receive a response within 2 business days.

## 2020-03-10 NOTE — Telephone Encounter (Signed)
Refills were sent to [facility] in May.

## 2020-03-10 NOTE — Telephone Encounter (Signed)
Message left on voice mail.

## 2020-03-11 ENCOUNTER — Ambulatory Visit (INDEPENDENT_AMBULATORY_CARE_PROVIDER_SITE_OTHER): Payer: Medicare HMO | Admitting: Nurse Practitioner

## 2020-03-11 ENCOUNTER — Other Ambulatory Visit: Payer: Self-pay

## 2020-03-11 ENCOUNTER — Other Ambulatory Visit: Payer: Self-pay | Admitting: *Deleted

## 2020-03-11 ENCOUNTER — Encounter: Payer: Self-pay | Admitting: Nurse Practitioner

## 2020-03-11 VITALS — BP 105/74 | HR 72 | Temp 98.0°F | Resp 20 | Ht 64.0 in | Wt 131.0 lb

## 2020-03-11 DIAGNOSIS — E039 Hypothyroidism, unspecified: Secondary | ICD-10-CM

## 2020-03-11 DIAGNOSIS — R300 Dysuria: Secondary | ICD-10-CM

## 2020-03-11 LAB — MICROSCOPIC EXAMINATION: Renal Epithel, UA: NONE SEEN /hpf

## 2020-03-11 LAB — URINALYSIS, COMPLETE
Bilirubin, UA: NEGATIVE
Glucose, UA: NEGATIVE
Ketones, UA: NEGATIVE
Nitrite, UA: NEGATIVE
Protein,UA: NEGATIVE
Specific Gravity, UA: 1.015 (ref 1.005–1.030)
Urobilinogen, Ur: 0.2 mg/dL (ref 0.2–1.0)
pH, UA: 8 — ABNORMAL HIGH (ref 5.0–7.5)

## 2020-03-11 MED ORDER — CEPHALEXIN 500 MG PO CAPS: 500.0000 mg | ORAL_CAPSULE | Freq: Three times a day (TID) | ORAL | 0 refills | Status: DC

## 2020-03-11 MED ORDER — LEVOTHYROXINE SODIUM 50 MCG PO TABS: ORAL_TABLET | ORAL | 3 refills | Status: DC

## 2020-03-11 NOTE — Patient Instructions (Signed)
Urinary Tract Infection, Adult A urinary tract infection (UTI) is an infection of any part of the urinary tract. The urinary tract includes:  The kidneys.  The ureters.  The bladder.  The urethra. These organs make, store, and get rid of pee (urine) in the body. What are the causes? This is caused by germs (bacteria) in your genital area. These germs grow and cause swelling (inflammation) of your urinary tract. What increases the risk? You are more likely to develop this condition if:  You have a small, thin tube (catheter) to drain pee.  You cannot control when you pee or poop (incontinence).  You are female, and: ? You use these methods to prevent pregnancy:  A medicine that kills sperm (spermicide).  A device that blocks sperm (diaphragm). ? You have low levels of a female hormone (estrogen). ? You are pregnant.  You have genes that add to your risk.  You are sexually active.  You take antibiotic medicines.  You have trouble peeing because of: ? A prostate that is bigger than normal, if you are female. ? A blockage in the part of your body that drains pee from the bladder (urethra). ? A kidney stone. ? A nerve condition that affects your bladder (neurogenic bladder). ? Not getting enough to drink. ? Not peeing often enough.  You have other conditions, such as: ? Diabetes. ? A weak disease-fighting system (immune system). ? Sickle cell disease. ? Gout. ? Injury of the spine. What are the signs or symptoms? Symptoms of this condition include:  Needing to pee right away (urgently).  Peeing often.  Peeing small amounts often.  Pain or burning when peeing.  Blood in the pee.  Pee that smells bad or not like normal.  Trouble peeing.  Pee that is cloudy.  Fluid coming from the vagina, if you are female.  Pain in the belly or lower back. Other symptoms include:  Throwing up (vomiting).  No urge to eat.  Feeling mixed up (confused).  Being tired  and grouchy (irritable).  A fever.  Watery poop (diarrhea). How is this treated? This condition may be treated with:  Antibiotic medicine.  Other medicines.  Drinking enough water. Follow these instructions at home:  Medicines  Take over-the-counter and prescription medicines only as told by your doctor.  If you were prescribed an antibiotic medicine, take it as told by your doctor. Do not stop taking it even if you start to feel better. General instructions  Make sure you: ? Pee until your bladder is empty. ? Do not hold pee for a long time. ? Empty your bladder after sex. ? Wipe from front to back after pooping if you are a female. Use each tissue one time when you wipe.  Drink enough fluid to keep your pee pale yellow.  Keep all follow-up visits as told by your doctor. This is important. Contact a doctor if:  You do not get better after 1-2 days.  Your symptoms go away and then come back. Get help right away if:  You have very bad back pain.  You have very bad pain in your lower belly.  You have a fever.  You are sick to your stomach (nauseous).  You are throwing up. Summary  A urinary tract infection (UTI) is an infection of any part of the urinary tract.  This condition is caused by germs in your genital area.  There are many risk factors for a UTI. These include having a small, thin   tube to drain pee and not being able to control when you pee or poop.  Treatment includes antibiotic medicines for germs.  Drink enough fluid to keep your pee pale yellow. This information is not intended to replace advice given to you by your health care provider. Make sure you discuss any questions you have with your health care provider. Document Revised: 08/24/2018 Document Reviewed: 03/16/2018 Elsevier Patient Education  2020 [employer]  

## 2020-03-11 NOTE — Progress Notes (Signed)
Subjective:    Patient ID: JOSELINNE LAWAL, female    DOB: 1966-01-28, 54 y.o.   MRN: [id number]   Chief Complaint: Dysuria and Nausea   HPI Pt suspects UTI for ~2 days. States she feels she is not emptying her bladder when urinating and reports increased urination. States burning sensation after urination but no other pain. Some nausea that began today.    Review of Systems  Constitutional: Negative.   HENT: Negative.   Eyes: Negative.   Respiratory: Negative.   Cardiovascular: Negative.   Gastrointestinal: Negative.   Endocrine: Negative.   Genitourinary: Positive for difficulty urinating and dysuria.  Musculoskeletal: Negative.   Skin: Negative.   Allergic/Immunologic: Negative.   Neurological: Negative.   Hematological: Negative.   Psychiatric/Behavioral: Negative.        Objective:   Physical Exam Constitutional:      Appearance: Normal appearance. She is normal weight.  HENT:     Head: Normocephalic.     Right Ear: Tympanic membrane, ear canal and external ear normal.     Left Ear: Tympanic membrane, ear canal and external ear normal.     Nose: Nose normal.     Mouth/Throat:     Mouth: Mucous membranes are moist.     Pharynx: Oropharynx is clear.  Eyes:     Extraocular Movements: Extraocular movements intact.     Conjunctiva/sclera: Conjunctivae normal.     Pupils: Pupils are equal, round, and reactive to light.  Cardiovascular:     Rate and Rhythm: Normal rate and regular rhythm.     Pulses: Normal pulses.     Heart sounds: Normal heart sounds.  Pulmonary:     Effort: Pulmonary effort is normal.     Breath sounds: Normal breath sounds.  Abdominal:     General: Abdomen is flat. Bowel sounds are normal.     Palpations: Abdomen is soft.  Musculoskeletal:        General: Normal range of motion.     Cervical back: Normal range of motion and neck supple.  Skin:    General: Skin is warm and dry.     Capillary Refill: Capillary refill takes less than 2  seconds.  Neurological:     General: No focal deficit present.     Mental Status: She is alert and oriented to person, place, and time. Mental status is at baseline.  Psychiatric:        Mood and Affect: Mood normal.        Behavior: Behavior normal.        Thought Content: Thought content normal.        Judgment: Judgment normal.      BP 105/74   Pulse 72   Temp 98 F (36.7 C) (Temporal)   Resp 20   Ht 5\' 4"  (1.626 m)   Wt 131 lb (59.4 kg)   SpO2 98%   BMI 22.49 kg/m      Assessment & Plan:Melaya H Frymire in today with chief complaint of Dysuria and Nausea   1. Dysuria Take Keflex 500 mg (1 tab) three times a day. Please make sure you take the full course unless otherwise directed. We will send your urine off to be cultured and call you with the results.  - Urinalysis, Complete    The above assessment and management plan was discussed with the patient. The patient verbalized understanding of and has agreed to the management plan. Patient is aware to call the clinic if symptoms persist  or worsen. Patient is aware when to return to the clinic for a follow-up visit. Patient educated on when it is appropriate to go to the emergency department.   Mary-Margaret Hassell Done, FNP

## 2020-03-15 LAB — URINE CULTURE

## 2020-03-17 MED ORDER — CEFDINIR 300 MG PO CAPS: 300.0000 mg | ORAL_CAPSULE | Freq: Two times a day (BID) | ORAL | 0 refills | Status: DC

## 2020-03-17 NOTE — Addendum Note (Signed)
Addended by: Chevis Pretty on: 03/17/2020 08:00 AM   Modules accepted: Orders

## 2020-03-18 DIAGNOSIS — Z86000 Personal history of in-situ neoplasm of breast: Secondary | ICD-10-CM | POA: Diagnosis not present

## 2020-03-18 DIAGNOSIS — Z1239 Encounter for other screening for malignant neoplasm of breast: Secondary | ICD-10-CM | POA: Diagnosis not present

## 2020-03-18 DIAGNOSIS — Z9189 Other specified personal risk factors, not elsewhere classified: Secondary | ICD-10-CM | POA: Diagnosis not present

## 2020-03-18 DIAGNOSIS — R922 Inconclusive mammogram: Secondary | ICD-10-CM | POA: Diagnosis not present

## 2020-03-26 ENCOUNTER — Telehealth: Payer: Self-pay | Admitting: Family

## 2020-03-26 NOTE — Telephone Encounter (Signed)
Covering pcp requesting medication for yeast infection from the antibiotic she is on please advise

## 2020-03-26 NOTE — Telephone Encounter (Signed)
Should have been done with antibiotic a week ago. Are these new symptoms?

## 2020-03-26 NOTE — Telephone Encounter (Signed)
Pt called regarding most recent lab results for UTI. Says she is still taking the Keflex antibiotic and feels some better but feels like she now has a yeast infection. Requesting that MMM send in script for yeast infection.

## 2020-03-27 MED ORDER — FLUCONAZOLE 150 MG PO TABS
150.0000 mg | ORAL_TABLET | ORAL | 0 refills | Status: DC | PRN
Start: 2020-03-27 — End: 2020-06-11

## 2020-03-27 NOTE — Telephone Encounter (Signed)
Diflucan was not sent with antibiotic can we send one in for patient please?

## 2020-03-27 NOTE — Telephone Encounter (Signed)
Diflucan Prescription sent to pharmacy   

## 2020-03-27 NOTE — Telephone Encounter (Signed)
Patient aware, script is ready per message left on voice mail.

## 2020-04-03 ENCOUNTER — Ambulatory Visit (INDEPENDENT_AMBULATORY_CARE_PROVIDER_SITE_OTHER): Payer: Medicare HMO

## 2020-04-03 ENCOUNTER — Ambulatory Visit: Payer: Medicare HMO | Admitting: Podiatry

## 2020-04-03 ENCOUNTER — Other Ambulatory Visit: Payer: Self-pay

## 2020-04-03 ENCOUNTER — Encounter: Payer: Self-pay | Admitting: Podiatry

## 2020-04-03 VITALS — Temp 96.3°F

## 2020-04-03 DIAGNOSIS — M779 Enthesopathy, unspecified: Secondary | ICD-10-CM | POA: Diagnosis not present

## 2020-04-03 DIAGNOSIS — L84 Corns and callosities: Secondary | ICD-10-CM

## 2020-04-03 DIAGNOSIS — M2042 Other hammer toe(s) (acquired), left foot: Secondary | ICD-10-CM | POA: Diagnosis not present

## 2020-04-04 NOTE — Progress Notes (Signed)
Subjective:   Patient ID: Ruth Hopkins, female   DOB: 54 y.o.   MRN: [id number]   HPI Patient states she has developed a painful lesion at the end of her second toe left concerned about hammertoe and knows she may have to have surgery on this   ROS      Objective:  Physical Exam  Neurovascular status intact with patient second digit left foot showing keratotic distal lesion formation that is related to the structure and length of the digit itself     Assessment:  Hammertoe deformity second digit left creating distal keratotic inflamed tissue formation     Plan:  H&P x-ray reviewed reviewed condition and hammertoe and discussed possibility for shortening fusion of the toe.  Today distal debridement of the tissue was accomplished no iatrogenic bleeding and padding applied to try to cushion it and we will see how she does with this and if it does not improve will need to consider surgery  X-rays indicate moderate elongation second digit left with no other pathology associated with this condition

## 2020-04-08 ENCOUNTER — Ambulatory Visit (INDEPENDENT_AMBULATORY_CARE_PROVIDER_SITE_OTHER): Payer: Medicare HMO | Admitting: Nurse Practitioner

## 2020-04-08 ENCOUNTER — Encounter: Payer: Self-pay | Admitting: Nurse Practitioner

## 2020-04-08 DIAGNOSIS — R110 Nausea: Secondary | ICD-10-CM

## 2020-04-08 DIAGNOSIS — J0101 Acute recurrent maxillary sinusitis: Secondary | ICD-10-CM | POA: Diagnosis not present

## 2020-04-08 MED ORDER — AMOXICILLIN-POT CLAVULANATE 875-125 MG PO TABS
1.0000 | ORAL_TABLET | Freq: Two times a day (BID) | ORAL | 0 refills | Status: DC
Start: 2020-04-08 — End: 2020-06-11

## 2020-04-08 NOTE — Progress Notes (Signed)
Virtual Visit via telephone Note Due to COVID-19 pandemic this visit was conducted virtually. This visit type was conducted due to national recommendations for restrictions regarding the COVID-19 Pandemic (e.g. social distancing, sheltering in place) in an effort to limit this patient's exposure and mitigate transmission in our community. All issues noted in this document were discussed and addressed.  A physical exam was not performed with this format.  I connected with ODESTER NILSON on 04/08/20 at 11:30 by telephone and verified that I am speaking with the correct person using two identifiers. YVETTA DROTAR is currently located at home and no one is currently with her during visit. The provider, Mary-Margaret Hassell Done, FNP is located in their office at time of visit.  I discussed the limitations, risks, security and privacy concerns of performing an evaluation and management service by telephone and the availability of in person appointments. I also discussed with the patient that there may be a patient responsible charge related to this service. The patient expressed understanding and agreed to proceed.   History and Present Illness:   Chief Complaint: Dysuria, Nausea, and Sinusitis   HPI - had uti several weeks ago and she says she still feels like she may still have an infection. She deneis dysuria, urgency and freq. Has some nausea that she will usually have with uti - she thinks she may have a sinus infection, because she gets nausated when she has a sinus infection. Some facial pressure with congestion. SH ehas been wlking aot outside and that seems to have brought this on.  Review of Systems  Constitutional: Negative for chills and fever.  HENT: Positive for congestion and sinus pain. Negative for ear pain and sore throat.   Respiratory: Negative for cough.   Gastrointestinal: Positive for nausea.  Genitourinary: Negative for dysuria, frequency and urgency.  Neurological: Negative  for dizziness and headaches.  All other systems reviewed and are negative.    Observations/Objective: Alert and oriented- answers all questions appropriately No distress Voice hoarse No cough noted   Assessment and Plan: KHUSHBU PIPPEN in today with chief complaint of Dysuria, Nausea, and Sinusitis   1. Acute recurrent maxillary sinusitis  2. Nausea 1. Take meds as prescribed 2. Use a cool mist humidifier especially during the winter months and when heat has been humid. 3. Use saline nose sprays frequently 4. Saline irrigations of the nose can be very helpful if done frequently.  * 4X daily for 1 week*  * Use of a nettie pot can be helpful with this. Follow directions with this* 5. Drink plenty of fluids 6. Keep thermostat turn down low 7.For any cough or congestion  Use plain Mucinex- regular strength or max strength is fine   * Children- consult with Pharmacist for dosing 8. For fever or aces or pains- take tylenol or ibuprofen appropriate for age and weight.  * for fevers greater than 101 orally you may alternate ibuprofen and tylenol every  3 hours.   Meds ordered this encounter  Medications  . amoxicillin-clavulanate (AUGMENTIN) 875-125 MG tablet    Sig: Take 1 tablet by mouth 2 (two) times daily.    Dispense:  14 tablet    Refill:  0    Order Specific Question:   Supervising Provider    Answer:   Caryl Pina A [[id number]]        Follow Up Instructions: prn    I discussed the assessment and treatment plan with the patient. The patient was provided  an opportunity to ask questions and all were answered. The patient agreed with the plan and demonstrated an understanding of the instructions.   The patient was advised to call back or seek an in-person evaluation if the symptoms worsen or if the condition fails to improve as anticipated.  The above assessment and management plan was discussed with the patient. The patient verbalized understanding of and has  agreed to the management plan. Patient is aware to call the clinic if symptoms persist or worsen. Patient is aware when to return to the clinic for a follow-up visit. Patient educated on when it is appropriate to go to the emergency department.   Time call ended:  11:45  I provided 15 minutes of non-face-to-face time during this encounter.    Mary-Margaret Hassell Done, FNP

## 2020-04-14 ENCOUNTER — Other Ambulatory Visit: Payer: Self-pay | Admitting: Family

## 2020-04-16 DIAGNOSIS — Z20822 Contact with and (suspected) exposure to covid-19: Secondary | ICD-10-CM | POA: Diagnosis not present

## 2020-04-17 ENCOUNTER — Encounter: Payer: Self-pay | Admitting: Nurse Practitioner

## 2020-04-17 ENCOUNTER — Ambulatory Visit (INDEPENDENT_AMBULATORY_CARE_PROVIDER_SITE_OTHER): Payer: Medicare HMO | Admitting: Nurse Practitioner

## 2020-04-17 ENCOUNTER — Other Ambulatory Visit: Payer: Self-pay | Admitting: *Deleted

## 2020-04-17 DIAGNOSIS — H1012 Acute atopic conjunctivitis, left eye: Secondary | ICD-10-CM

## 2020-04-17 MED ORDER — OLOPATADINE HCL 0.2 % OP SOLN: 1.0000 [drp] | Freq: Two times a day (BID) | OPHTHALMIC | 2 refills | Status: DC

## 2020-04-17 MED ORDER — MOMETASONE FUROATE 50 MCG/ACT NA SUSP: 2.0000 | Freq: Every day | NASAL | 1 refills | Status: DC

## 2020-04-17 NOTE — Progress Notes (Signed)
   Virtual Visit via telephone Note Due to COVID-19 pandemic this visit was conducted virtually. This visit type was conducted due to national recommendations for restrictions regarding the COVID-19 Pandemic (e.g. social distancing, sheltering in place) in an effort to limit this patient's exposure and mitigate transmission in our community. All issues noted in this document were discussed and addressed.  A physical exam was not performed with this format.  I connected with [name] on 04/17/20 at 1:00 by telephone and verified that I am speaking with the correct person using two identifiers. Ruth Hopkins is currently located at home and no one is currently with her during visit. The provider, Mary-Margaret Hassell Done, FNP is located in their office at time of visit.  I discussed the limitations, risks, security and privacy concerns of performing an evaluation and management service by telephone and the availability of in person appointments. I also discussed with the patient that there may be a patient responsible charge related to this service. The patient expressed understanding and agreed to proceed.   History and Present Illness:   Chief Complaint: itchy eye  HPI Patient calls in stating that her left has been itchy and watery eye. She has had allergy eye drops in the past which helped   Review of Systems  Constitutional: Negative for diaphoresis and weight loss.  Eyes: Positive for discharge (watery). Negative for double vision, pain and redness.  Respiratory: Negative for shortness of breath.   Cardiovascular: Negative for chest pain, palpitations, orthopnea and leg swelling.  Gastrointestinal: Negative for abdominal pain.  Skin: Negative for rash.  Neurological: Negative for dizziness, sensory change, loss of consciousness, weakness and headaches.  Endo/Heme/Allergies: Negative for polydipsia. Does not bruise/bleed easily.  Psychiatric/Behavioral: Negative for memory loss. The  patient does not have insomnia.   All other systems reviewed and are negative.    Observations/Objective: Alert and oriented- answers all questions appropriately No distress    Assessment and Plan: SIBBIE FLAMMIA in today with chief complaint of No chief complaint on file.   1. Allergic conjunctivitis of left eye Avoid rubbing eye Cool compresses Meds ordered this encounter  Medications  . Olopatadine HCl 0.2 % SOLN    Sig: Apply 1 drop to eye in the morning and at bedtime.    Dispense:  2.5 mL    Refill:  2    Order Specific Question:   Supervising Provider    Answer:   Caryl Pina A [[id number]]        Follow Up Instructions: prn    I discussed the assessment and treatment plan with the patient. The patient was provided an opportunity to ask questions and all were answered. The patient agreed with the plan and demonstrated an understanding of the instructions.   The patient was advised to call back or seek an in-person evaluation if the symptoms worsen or if the condition fails to improve as anticipated.  The above assessment and management plan was discussed with the patient. The patient verbalized understanding of and has agreed to the management plan. Patient is aware to call the clinic if symptoms persist or worsen. Patient is aware when to return to the clinic for a follow-up visit. Patient educated on when it is appropriate to go to the emergency department.   Time call ended:  1:15  I provided 15 minutes of non-face-to-face time during this encounter.    Mary-Margaret Hassell Done, FNP

## 2020-04-18 ENCOUNTER — Telehealth: Payer: Self-pay | Admitting: Family

## 2020-04-18 NOTE — Telephone Encounter (Signed)
Pt says she has a bump/sore on the tip of her tongue that is really painful and is wanting advice on what she can do or take OTC to help relieve the pain.

## 2020-04-18 NOTE — Telephone Encounter (Signed)
Attempted to contact - number not working  

## 2020-04-21 ENCOUNTER — Other Ambulatory Visit: Payer: Self-pay | Admitting: *Deleted

## 2020-04-22 MED ORDER — OMEPRAZOLE 20 MG PO CPDR: 20.0000 mg | DELAYED_RELEASE_CAPSULE | Freq: Every day | ORAL | 3 refills | Status: DC

## 2020-04-29 DIAGNOSIS — R69 Illness, unspecified: Secondary | ICD-10-CM | POA: Diagnosis not present

## 2020-05-01 ENCOUNTER — Telehealth: Payer: Self-pay | Admitting: Family

## 2020-05-01 ENCOUNTER — Other Ambulatory Visit: Payer: Medicare HMO

## 2020-05-01 ENCOUNTER — Other Ambulatory Visit: Payer: Self-pay

## 2020-05-01 DIAGNOSIS — R300 Dysuria: Secondary | ICD-10-CM

## 2020-05-01 LAB — URINALYSIS, COMPLETE
Bilirubin, UA: NEGATIVE
Glucose, UA: NEGATIVE
Ketones, UA: NEGATIVE
Leukocytes,UA: NEGATIVE
Nitrite, UA: NEGATIVE
Protein,UA: NEGATIVE
Specific Gravity, UA: 1.02 (ref 1.005–1.030)
Urobilinogen, Ur: 0.2 mg/dL (ref 0.2–1.0)
pH, UA: 7 (ref 5.0–7.5)

## 2020-05-01 LAB — MICROSCOPIC EXAMINATION
Bacteria, UA: NONE SEEN
WBC, UA: NONE SEEN /hpf (ref 0–5)

## 2020-05-01 NOTE — Telephone Encounter (Signed)
Patient was seen on 04/08/2020 and diagnosed with a UTI.  She was prescribed Augmentin and has completed the medication.  She wants to make sure it has cleared up.  Order placed for patient to come in to have urine specimen rechecked.

## 2020-05-02 ENCOUNTER — Telehealth: Payer: Self-pay | Admitting: Family

## 2020-05-02 LAB — URINE CULTURE

## 2020-05-02 NOTE — Telephone Encounter (Signed)
Pt informed that urine was cultured and it has not been resulted.

## 2020-05-05 DIAGNOSIS — Z1239 Encounter for other screening for malignant neoplasm of breast: Secondary | ICD-10-CM | POA: Diagnosis not present

## 2020-05-05 DIAGNOSIS — R590 Localized enlarged lymph nodes: Secondary | ICD-10-CM | POA: Diagnosis not present

## 2020-05-05 DIAGNOSIS — Z9289 Personal history of other medical treatment: Secondary | ICD-10-CM | POA: Diagnosis not present

## 2020-05-05 DIAGNOSIS — Z9189 Other specified personal risk factors, not elsewhere classified: Secondary | ICD-10-CM | POA: Diagnosis not present

## 2020-05-05 DIAGNOSIS — R922 Inconclusive mammogram: Secondary | ICD-10-CM | POA: Diagnosis not present

## 2020-05-05 DIAGNOSIS — Z87898 Personal history of other specified conditions: Secondary | ICD-10-CM | POA: Diagnosis not present

## 2020-05-05 DIAGNOSIS — Z86000 Personal history of in-situ neoplasm of breast: Secondary | ICD-10-CM | POA: Diagnosis not present

## 2020-05-09 DIAGNOSIS — F411 Generalized anxiety disorder: Secondary | ICD-10-CM | POA: Diagnosis not present

## 2020-05-09 DIAGNOSIS — R69 Illness, unspecified: Secondary | ICD-10-CM | POA: Diagnosis not present

## 2020-06-06 DIAGNOSIS — R300 Dysuria: Secondary | ICD-10-CM | POA: Diagnosis not present

## 2020-06-06 DIAGNOSIS — N390 Urinary tract infection, site not specified: Secondary | ICD-10-CM | POA: Diagnosis not present

## 2020-06-11 ENCOUNTER — Ambulatory Visit (INDEPENDENT_AMBULATORY_CARE_PROVIDER_SITE_OTHER): Payer: Medicare HMO | Admitting: Family Medicine

## 2020-06-11 DIAGNOSIS — N3000 Acute cystitis without hematuria: Secondary | ICD-10-CM | POA: Diagnosis not present

## 2020-06-11 MED ORDER — FLUCONAZOLE 150 MG PO TABS: 150.0000 mg | ORAL_TABLET | Freq: Once | ORAL | 0 refills | Status: AC

## 2020-06-11 MED ORDER — CEPHALEXIN 500 MG PO CAPS: 500.0000 mg | ORAL_CAPSULE | Freq: Three times a day (TID) | ORAL | 0 refills | Status: DC

## 2020-06-11 NOTE — Patient Instructions (Signed)
Urinary Tract Infection, Adult A urinary tract infection (UTI) is an infection of any part of the urinary tract. The urinary tract includes:  The kidneys.  The ureters.  The bladder.  The urethra. These organs make, store, and get rid of pee (urine) in the body. What are the causes? This is caused by germs (bacteria) in your genital area. These germs grow and cause swelling (inflammation) of your urinary tract. What increases the risk? You are more likely to develop this condition if:  You have a small, thin tube (catheter) to drain pee.  You cannot control when you pee or poop (incontinence).  You are female, and: ? You use these methods to prevent pregnancy:  A medicine that kills sperm (spermicide).  A device that blocks sperm (diaphragm). ? You have low levels of a female hormone (estrogen). ? You are pregnant.  You have genes that add to your risk.  You are sexually active.  You take antibiotic medicines.  You have trouble peeing because of: ? A prostate that is bigger than normal, if you are female. ? A blockage in the part of your body that drains pee from the bladder (urethra). ? A kidney stone. ? A nerve condition that affects your bladder (neurogenic bladder). ? Not getting enough to drink. ? Not peeing often enough.  You have other conditions, such as: ? Diabetes. ? A weak disease-fighting system (immune system). ? Sickle cell disease. ? Gout. ? Injury of the spine. What are the signs or symptoms? Symptoms of this condition include:  Needing to pee right away (urgently).  Peeing often.  Peeing small amounts often.  Pain or burning when peeing.  Blood in the pee.  Pee that smells bad or not like normal.  Trouble peeing.  Pee that is cloudy.  Fluid coming from the vagina, if you are female.  Pain in the belly or lower back. Other symptoms include:  Throwing up (vomiting).  No urge to eat.  Feeling mixed up (confused).  Being tired  and grouchy (irritable).  A fever.  Watery poop (diarrhea). How is this treated? This condition may be treated with:  Antibiotic medicine.  Other medicines.  Drinking enough water. Follow these instructions at home:  Medicines  Take over-the-counter and prescription medicines only as told by your doctor.  If you were prescribed an antibiotic medicine, take it as told by your doctor. Do not stop taking it even if you start to feel better. General instructions  Make sure you: ? Pee until your bladder is empty. ? Do not hold pee for a long time. ? Empty your bladder after sex. ? Wipe from front to back after pooping if you are a female. Use each tissue one time when you wipe.  Drink enough fluid to keep your pee pale yellow.  Keep all follow-up visits as told by your doctor. This is important. Contact a doctor if:  You do not get better after 1-2 days.  Your symptoms go away and then come back. Get help right away if:  You have very bad back pain.  You have very bad pain in your lower belly.  You have a fever.  You are sick to your stomach (nauseous).  You are throwing up. Summary  A urinary tract infection (UTI) is an infection of any part of the urinary tract.  This condition is caused by germs in your genital area.  There are many risk factors for a UTI. These include having a small, thin   tube to drain pee and not being able to control when you pee or poop.  Treatment includes antibiotic medicines for germs.  Drink enough fluid to keep your pee pale yellow. This information is not intended to replace advice given to you by your health care provider. Make sure you discuss any questions you have with your health care provider. Document Revised: 08/24/2018 Document Reviewed: 03/16/2018 Elsevier Patient Education  2020 [employer]  

## 2020-06-11 NOTE — Progress Notes (Signed)
Telephone visit  Subjective: CC: UTI PCP: Sharion Balloon, FNP YSA:YTKZS H Pedone is a 54 y.o. female calls for telephone consult today. Patient provides verbal consent for consult held via phone.  Due to COVID-19 pandemic this visit was conducted virtually. This visit type was conducted due to national recommendations for restrictions regarding the COVID-19 Pandemic (e.g. social distancing, sheltering in place) in an effort to limit this patient's exposure and mitigate transmission in our community. All issues noted in this document were discussed and addressed.  A physical exam was not performed with this format.   Location of patient: home Location of provider: [facility] Others present for call: none  1. Urinary symptoms Patient reports difficulty urinating since Friday. She went to a Dr in [facility] and she was found to have a UTI. She was started on Macrobid since Friday and symptoms have not resolved.  She reports urgency.  No hematuria, fevers, chills, abdominal pain, nausea, vomiting, back pain, vaginal discharge.  ROS: Per HPI  Allergies  Allergen Reactions  . Latex Itching and Rash  . Codeine   . Gabapentin     "fuzzy"  . Sulfa Antibiotics    Past Medical History:  Diagnosis Date  . Chronic fatigue   . Depression   . Fibromyalgia   . IBS (irritable bowel syndrome)   . Migraine   . Thyroid disease     Current Outpatient Medications:  .  acetaminophen (TYLENOL) 500 MG tablet, Take by mouth., Disp: , Rfl:  .  amoxicillin-clavulanate (AUGMENTIN) 875-125 MG tablet, Take 1 tablet by mouth 2 (two) times daily., Disp: 14 tablet, Rfl: 0 .  buPROPion (WELLBUTRIN XL) 300 MG 24 hr tablet, Take 1 tablet (300 mg total) by mouth daily., Disp: 30 tablet, Rfl: 11 .  cefdinir (OMNICEF) 300 MG capsule, Take 1 capsule (300 mg total) by mouth 2 (two) times daily. 1 po BID, Disp: 20 capsule, Rfl: 0 .  cephALEXin (KEFLEX) 500 MG capsule, Take 1 capsule (500 mg total) by mouth 3 (three) times  daily., Disp: 21 capsule, Rfl: 0 .  clonazePAM (KLONOPIN) 0.5 MG tablet, Take 0.5 mg by mouth 2 (two) times daily. , Disp: , Rfl:  .  cycloSPORINE (RESTASIS) 0.05 % ophthalmic emulsion, Restasis 0.05 % eye drops in a dropperette  USE 1 DROP INTO BOTH EYES TWICE A DAY, Disp: , Rfl:  .  diclofenac (VOLTAREN) 75 MG EC tablet, Take 1 tablet (75 mg total) by mouth 2 (two) times daily., Disp: 180 tablet, Rfl: 3 .  eletriptan (RELPAX) 40 MG tablet, TAKE 1 TABLET EVERY 2 HOURS AS NEEDED FOR MIGRAINE, UP TO 6 TABLETS IN 30 DAYS, Disp: 12 tablet, Rfl: 0 .  estradiol (DOTTI) 0.1 MG/24HR patch, Dotti 0.1 mg/24 hr transdermal patch  APPLY 1 PATCH TOPICALLY TWICE A WEEK, Disp: , Rfl:  .  fluconazole (DIFLUCAN) 150 MG tablet, Take 1 tablet (150 mg total) by mouth every three (3) days as needed., Disp: 3 tablet, Rfl: 0 .  HYDROcodone-acetaminophen (NORCO) 5-325 MG tablet, Take 1 tablet by mouth every 6 (six) hours as needed for moderate pain., Disp: 30 tablet, Rfl: 0 .  imipramine (TOFRANIL) 10 MG tablet, Take 3 tablets (30 mg total) by mouth at bedtime., Disp: 90 tablet, Rfl: 11 .  levothyroxine (SYNTHROID) 50 MCG tablet, TAKE 1 TABLET BY MOUTH ONCE DAILY BEFORE BREAKFAST, Disp: 90 tablet, Rfl: 3 .  methocarbamol (ROBAXIN) 500 MG tablet, Take 1 tablet (500 mg total) by mouth every 6 (six) hours as  needed for muscle spasms., Disp: 30 tablet, Rfl: 5 .  mometasone (NASONEX) 50 MCG/ACT nasal spray, Place 2 sprays into the nose daily., Disp: 51 g, Rfl: 1 .  Multiple Vitamins-Minerals (MULTIVITAMIN PO), Take by mouth., Disp: , Rfl:  .  Olopatadine HCl 0.2 % SOLN, Apply 1 drop to eye in the morning and at bedtime., Disp: 2.5 mL, Rfl: 2 .  omeprazole (PRILOSEC) 20 MG capsule, Take 1 capsule (20 mg total) by mouth daily., Disp: 90 capsule, Rfl: 3  Assessment/ Plan: 54 y.o. female   1. Acute cystitis without hematuria Known UTI refractory to Macrobid.  Keflex to replace.  Patient to come in for repeat UA and UCx if  symptoms do not start improving by Friday.  Push fluids. Follow up prn. - cephALEXin (KEFLEX) 500 MG capsule; Take 1 capsule (500 mg total) by mouth 3 (three) times daily.  Dispense: 21 capsule; Refill: 0 - fluconazole (DIFLUCAN) 150 MG tablet; Take 1 tablet (150 mg total) by mouth once for 1 dose.  Dispense: 1 tablet; Refill: 0  Orders Placed This Encounter  Procedures  . Urine Culture    Standing Status:   Future    Standing Expiration Date:   06/19/2020  . Urinalysis, Complete    Standing Status:   Future    Standing Expiration Date:   06/19/2020     Start time: 8:49am End time: 8:55am  Total time spent on patient care (including telephone call/ virtual visit): 11 minutes  [place], [place] [phone]

## 2020-07-14 DIAGNOSIS — Z01419 Encounter for gynecological examination (general) (routine) without abnormal findings: Secondary | ICD-10-CM | POA: Diagnosis not present

## 2020-07-14 DIAGNOSIS — D649 Anemia, unspecified: Secondary | ICD-10-CM | POA: Diagnosis not present

## 2020-07-14 DIAGNOSIS — Z6821 Body mass index (BMI) 21.0-21.9, adult: Secondary | ICD-10-CM | POA: Diagnosis not present

## 2020-07-14 DIAGNOSIS — Z79890 Hormone replacement therapy (postmenopausal): Secondary | ICD-10-CM | POA: Diagnosis not present

## 2020-07-18 ENCOUNTER — Ambulatory Visit: Payer: Medicare HMO | Admitting: Family

## 2020-07-24 ENCOUNTER — Encounter: Payer: Self-pay | Admitting: Family

## 2020-07-24 ENCOUNTER — Other Ambulatory Visit: Payer: Self-pay

## 2020-07-24 ENCOUNTER — Ambulatory Visit (INDEPENDENT_AMBULATORY_CARE_PROVIDER_SITE_OTHER): Payer: Medicare HMO | Admitting: Family

## 2020-07-24 VITALS — BP 105/70 | HR 70 | Temp 98.1°F | Ht 64.0 in | Wt 130.6 lb

## 2020-07-24 DIAGNOSIS — R69 Illness, unspecified: Secondary | ICD-10-CM | POA: Diagnosis not present

## 2020-07-24 DIAGNOSIS — G43901 Migraine, unspecified, not intractable, with status migrainosus: Secondary | ICD-10-CM

## 2020-07-24 DIAGNOSIS — E039 Hypothyroidism, unspecified: Secondary | ICD-10-CM | POA: Diagnosis not present

## 2020-07-24 DIAGNOSIS — F17200 Nicotine dependence, unspecified, uncomplicated: Secondary | ICD-10-CM

## 2020-07-24 DIAGNOSIS — M545 Low back pain, unspecified: Secondary | ICD-10-CM | POA: Diagnosis not present

## 2020-07-24 DIAGNOSIS — M797 Fibromyalgia: Secondary | ICD-10-CM

## 2020-07-24 DIAGNOSIS — Z23 Encounter for immunization: Secondary | ICD-10-CM

## 2020-07-24 DIAGNOSIS — F419 Anxiety disorder, unspecified: Secondary | ICD-10-CM

## 2020-07-24 DIAGNOSIS — K219 Gastro-esophageal reflux disease without esophagitis: Secondary | ICD-10-CM | POA: Diagnosis not present

## 2020-07-24 DIAGNOSIS — F339 Major depressive disorder, recurrent, unspecified: Secondary | ICD-10-CM

## 2020-07-24 LAB — MICROSCOPIC EXAMINATION: Epithelial Cells (non renal): NONE SEEN /hpf (ref 0–10)

## 2020-07-24 LAB — URINALYSIS, COMPLETE
Bilirubin, UA: NEGATIVE
Glucose, UA: NEGATIVE
Ketones, UA: NEGATIVE
Leukocytes,UA: NEGATIVE
Nitrite, UA: NEGATIVE
Protein,UA: NEGATIVE
Specific Gravity, UA: 1.025 (ref 1.005–1.030)
Urobilinogen, Ur: 0.2 mg/dL (ref 0.2–1.0)
pH, UA: 6 (ref 5.0–7.5)

## 2020-07-24 MED ORDER — LEVOTHYROXINE SODIUM 50 MCG PO TABS: ORAL_TABLET | ORAL | 3 refills | Status: DC

## 2020-07-24 MED ORDER — DICLOFENAC SODIUM 75 MG PO TBEC: 75.0000 mg | DELAYED_RELEASE_TABLET | Freq: Two times a day (BID) | ORAL | 3 refills | Status: DC

## 2020-07-24 MED ORDER — ELETRIPTAN HYDROBROMIDE 40 MG PO TABS: ORAL_TABLET | ORAL | 0 refills | Status: DC

## 2020-07-24 MED ORDER — HYDROCODONE-ACETAMINOPHEN 5-325 MG PO TABS: 1.0000 | ORAL_TABLET | Freq: Four times a day (QID) | ORAL | 0 refills | Status: DC | PRN

## 2020-07-24 MED ORDER — METHOCARBAMOL 500 MG PO TABS: 500.0000 mg | ORAL_TABLET | Freq: Four times a day (QID) | ORAL | 5 refills | Status: DC | PRN

## 2020-07-24 MED ORDER — OMEPRAZOLE 20 MG PO CPDR: 20.0000 mg | DELAYED_RELEASE_CAPSULE | Freq: Every day | ORAL | 3 refills | Status: DC

## 2020-07-24 NOTE — Patient Instructions (Signed)
Myofascial Pain Syndrome and Fibromyalgia Myofascial pain syndrome and fibromyalgia are both pain disorders. This pain may be felt mainly in your muscles.  Myofascial pain syndrome: ? Always has tender points in the muscle that will cause pain when pressed (trigger points). The pain may come and go. ? Usually affects your neck, upper back, and shoulder areas. The pain often radiates into your arms and hands.  Fibromyalgia: ? Has muscle pains and tenderness that come and go. ? Is often associated with fatigue and sleep problems. ? Has trigger points. ? Tends to be long-lasting (chronic), but is not life-threatening. Fibromyalgia and myofascial pain syndrome are not the same. However, they often occur together. If you have both conditions, each can make the other worse. Both are common and can cause enough pain and fatigue to make day-to-day activities difficult. Both can be hard to diagnose because their symptoms are common in many other conditions. What are the causes? The exact causes of these conditions are not known. What increases the risk? You are more likely to develop this condition if:  You have a family history of the condition.  You have certain triggers, such as: ? Spine disorders. ? An injury (trauma) or other physical stressors. ? Being under a lot of stress. ? Medical conditions such as osteoarthritis, rheumatoid arthritis, or lupus. What are the signs or symptoms? Fibromyalgia The main symptom of fibromyalgia is widespread pain and tenderness in your muscles. Pain is sometimes described as stabbing, shooting, or burning. You may also have:  Tingling or numbness.  Sleep problems and fatigue.  Problems with attention and concentration (fibro fog). Other symptoms may include:  Bowel and bladder problems.  Headaches.  Visual problems.  Problems with odors and noises.  Depression or mood changes.  Painful menstrual periods (dysmenorrhea).  Dry skin or  eyes. These symptoms can vary over time. Myofascial pain syndrome Symptoms of myofascial pain syndrome include:  Tight, ropy bands of muscle.  Uncomfortable sensations in muscle areas. These may include aching, cramping, burning, numbness, tingling, and weakness.  Difficulty moving certain parts of the body freely (poor range of motion). How is this diagnosed? This condition may be diagnosed by your symptoms and medical history. You will also have a physical exam. In general:  Fibromyalgia is diagnosed if you have pain, fatigue, and other symptoms for more than 3 months, and symptoms cannot be explained by another condition.  Myofascial pain syndrome is diagnosed if you have trigger points in your muscles, and those trigger points are tender and cause pain elsewhere in your body (referred pain). How is this treated? Treatment for these conditions depends on the type that you have.  For fibromyalgia: ? Pain medicines, such as NSAIDs. ? Medicines for treating depression. ? Medicines for treating seizures. ? Medicines that relax the muscles.  For myofascial pain: ? Pain medicines, such as NSAIDs. ? Cooling and stretching of muscles. ? Trigger point injections. ? Sound wave (ultrasound) treatments to stimulate muscles. Treating these conditions often requires a team of health care providers. These may include:  Your primary care provider.  Physical therapist.  Complementary health care providers, such as massage therapists or acupuncturists.  Psychiatrist for cognitive behavioral therapy. Follow these instructions at home: Medicines  Take over-the-counter and prescription medicines only as told by your health care provider.  Do not drive or use heavy machinery while taking prescription pain medicine.  If you are taking prescription pain medicine, take actions to prevent or treat constipation. Your health care   provider may recommend that you: ? Drink enough fluid to keep  your urine pale yellow. ? Eat foods that are high in fiber, such as fresh fruits and vegetables, whole grains, and beans. ? Limit foods that are high in fat and processed sugars, such as fried or sweet foods. ? Take an over-the-counter or prescription medicine for constipation. Lifestyle   Exercise as directed by your health care provider or physical therapist.  Practice relaxation techniques to control your stress. You may want to try: ? Biofeedback. ? Visual imagery. ? Hypnosis. ? Muscle relaxation. ? Yoga. ? Meditation.  Maintain a healthy lifestyle. This includes eating a healthy diet and getting enough sleep.  Do not use any products that contain nicotine or tobacco, such as cigarettes and e-cigarettes. If you need help quitting, ask your health care provider. General instructions  Talk to your health care provider about complementary treatments, such as acupuncture or massage.  Consider joining a support group with others who are diagnosed with this condition.  Do not do activities that stress or strain your muscles. This includes repetitive motions and heavy lifting.  Keep all follow-up visits as told by your health care provider. This is important. Where to find more information  National Fibromyalgia Association: www.fmaware.org  Arthritis Foundation: www.arthritis.org  American Chronic Pain Association: www.theacpa.org Contact a health care provider if:  You have new symptoms.  Your symptoms get worse or your pain is severe.  You have side effects from your medicines.  You have trouble sleeping.  Your condition is causing depression or anxiety. Summary  Myofascial pain syndrome and fibromyalgia are pain disorders.  Myofascial pain syndrome has tender points in the muscle that will cause pain when pressed (trigger points). Fibromyalgia also has muscle pains and tenderness that come and go, but this condition is often associated with fatigue and sleep  disturbances.  Fibromyalgia and myofascial pain syndrome are not the same but often occur together, causing pain and fatigue that make day-to-day activities difficult.  Treatment for fibromyalgia includes taking medicines to relax the muscles and medicines for pain, depression, or seizures. Treatment for myofascial pain syndrome includes taking medicines for pain, cooling and stretching of muscles, and injecting medicines into trigger points.  Follow your health care provider's instructions for taking medicines and maintaining a healthy lifestyle. This information is not intended to replace advice given to you by your health care provider. Make sure you discuss any questions you have with your health care provider. Document Revised: 12/29/2018 Document Reviewed: 09/21/2017 Elsevier Patient Education  2020 [employer]vier Inc.  

## 2020-07-24 NOTE — Progress Notes (Signed)
Subjective:    Patient ID: Ruth Hopkins, female    DOB: Mar 14, 1966, 54 y.o.   MRN: [id number]  Chief Complaint  Patient presents with  . Medical Management of Chronic Issues   PT presents to the office today for chronic follow up. She is followed by psychiatrist 3 months for hot flashes and GAD.She is currently taking estradiol patch and gradually decreasing and trying to add other medications. She has tried gabapentin, Lexapro, and Effexor, and wellbutrin without success.    Thyroid Problem Presents for follow-up visit. Symptoms include anxiety, constipation, fatigue and hair loss. Patient reports no depressed mood, diarrhea or dry skin. The symptoms have been stable.  Migraine  This is a chronic problem. The current episode started more than 1 year ago. The problem occurs intermittently. The problem has been waxing and waning. The pain quality is similar to prior headaches. Associated symptoms include nausea, phonophobia and photophobia. The symptoms are aggravated by emotional stress. She has tried triptans for the symptoms. The treatment provided moderate relief. Her past medical history is significant for migraines in the family.  Nicotine Dependence Presents for follow-up visit. Symptoms include fatigue and irritability. Her urge triggers include company of smokers. The symptoms have been stable. She smokes < 1/2 a pack of cigarettes per day. Compliance with prior treatments has been good.  Depression        This is a chronic problem.  The current episode started more than 1 year ago.   The onset quality is gradual.   The problem occurs intermittently.  Associated symptoms include fatigue, hopelessness, irritable, restlessness and sad.  Past medical history includes thyroid problem and anxiety.   Anxiety Presents for follow-up visit. Symptoms include excessive worry, irritability, nausea, nervous/anxious behavior and restlessness. Patient reports no depressed mood. Symptoms occur  occasionally. The severity of symptoms is moderate. The quality of sleep is good.    Gastroesophageal Reflux She complains of belching, heartburn and nausea. This is a chronic problem. The current episode started more than 1 year ago. The problem occurs occasionally. The problem has been waxing and waning. Associated symptoms include fatigue. She has tried a PPI for the symptoms. The treatment provided moderate relief.   Fibromyalgia  Has generalized pain of 7 out 10. She has tried Cymbalta and gabapentin. She is prescribed Norco 10-325 mg as needed. She only filled once in 2021 so far for #30.   Review of Systems  Constitutional: Positive for fatigue and irritability.  Eyes: Positive for photophobia.  Gastrointestinal: Positive for constipation, heartburn and nausea. Negative for diarrhea.  Psychiatric/Behavioral: Positive for depression. The patient is nervous/anxious.   All other systems reviewed and are negative.      Objective:   Physical Exam Vitals reviewed.  Constitutional:      General: She is irritable. She is not in acute distress.    Appearance: She is well-developed.  HENT:     Head: Normocephalic and atraumatic.     Right Ear: Tympanic membrane normal.     Left Ear: Tympanic membrane normal.  Eyes:     Pupils: Pupils are equal, round, and reactive to light.  Neck:     Thyroid: No thyromegaly.  Cardiovascular:     Rate and Rhythm: Normal rate and regular rhythm.     Heart sounds: Normal heart sounds. No murmur heard.   Pulmonary:     Effort: Pulmonary effort is normal. No respiratory distress.     Breath sounds: Normal breath sounds. No wheezing.  Abdominal:     General: Bowel sounds are normal. There is no distension.     Palpations: Abdomen is soft.     Tenderness: There is no abdominal tenderness.  Musculoskeletal:        General: No tenderness. Normal range of motion.     Cervical back: Normal range of motion and neck supple.  Skin:    General: Skin is  warm and dry.  Neurological:     Mental Status: She is alert and oriented to person, place, and time.     Cranial Nerves: No cranial nerve deficit.     Deep Tendon Reflexes: Reflexes are normal and symmetric.  Psychiatric:        Behavior: Behavior normal.        Thought Content: Thought content normal.        Judgment: Judgment normal.        BP 105/70   Pulse 70   Temp 98.1 F (36.7 C) (Temporal)   Ht _0  (1.626 m)   Wt 130 lb 9.6 oz (59.2 kg)   SpO2 100%   BMI 22.42 kg/m   Assessment & Plan:  Ruth Hopkins comes in today with chief complaint of Medical Management of Chronic Issues   Diagnosis and orders addressed:  1. Acquired hypothyroidism - levothyroxine (SYNTHROID) 50 MCG tablet; TAKE 1 TABLET BY MOUTH ONCE DAILY BEFORE BREAKFAST  Dispense: 90 tablet; Refill: 3 - CMP14+EGFR - CBC with Differential/Platelet - TSH  2. Acute left-sided low back pain without sciatica - methocarbamol (ROBAXIN) 500 MG tablet; Take 1 tablet (500 mg total) by mouth every 6 (six) hours as needed for muscle spasms.  Dispense: 30 tablet; Refill: 5 - CMP14+EGFR - CBC with Differential/Platelet - Urinalysis, Complete - Urine Culture  3. Fibromyalgia - HYDROcodone-acetaminophen (NORCO) 5-325 MG tablet; Take 1 tablet by mouth every 6 (six) hours as needed for moderate pain.  Dispense: 30 tablet; Refill: 0 - CMP14+EGFR - CBC with Differential/Platelet  4. Migraine with status migrainosus, not intractable, unspecified migraine type - CMP14+EGFR - CBC with Differential/Platelet  5. Anxiety - CMP14+EGFR - CBC with Differential/Platelet  6. Depression, recurrent ([place]) - CMP14+EGFR - CBC with Differential/Platelet  7. Smoker - CMP14+EGFR - CBC with Differential/Platelet  8. Gastroesophageal reflux disease, unspecified whether esophagitis present - omeprazole (PRILOSEC) 20 MG capsule; Take 1 capsule (20 mg total) by mouth daily.  Dispense: 90 capsule; Refill: 3 - CMP14+EGFR - CBC  with Differential/Platelet   Labs pending Health Maintenance reviewed Diet and exercise encouraged  Follow up plan:  6 months   Evelina Dun, FNP

## 2020-07-25 LAB — CMP14+EGFR
ALT: 19 IU/L (ref 0–32)
AST: 23 IU/L (ref 0–40)
Albumin/Globulin Ratio: 1.8 (ref 1.2–2.2)
Albumin: 4.1 g/dL (ref 3.8–4.9)
Alkaline Phosphatase: 60 IU/L (ref 44–121)
BUN/Creatinine Ratio: 39 — ABNORMAL HIGH (ref 9–23)
BUN: 26 mg/dL — ABNORMAL HIGH (ref 6–24)
Bilirubin Total: <0.2 mg/dL (ref 0.0–1.2)
CO2: 25 mmol/L (ref 20–29)
Calcium: 8.7 mg/dL (ref 8.7–10.2)
Chloride: 102 mmol/L (ref 96–106)
Creatinine, Ser: 0.66 mg/dL (ref 0.57–1.00)
GFR calc Af Amer: 115 mL/min/{1.73_m2} (ref >59)
GFR calc non Af Amer: 100 mL/min/{1.73_m2} (ref >59)
Globulin, Total: 2.3 g/dL (ref 1.5–4.5)
Glucose: 82 mg/dL (ref 65–99)
Potassium: 4 mmol/L (ref 3.5–5.2)
Sodium: 138 mmol/L (ref 134–144)
Total Protein: 6.4 g/dL (ref 6.0–8.5)

## 2020-07-25 LAB — CBC WITH DIFFERENTIAL/PLATELET
Basophils Absolute: 0 10*3/uL (ref 0.0–0.2)
Basos: 1 %
EOS (ABSOLUTE): 0.4 10*3/uL (ref 0.0–0.4)
Eos: 9 %
Hematocrit: 38 % (ref 34.0–46.6)
Hemoglobin: 12.5 g/dL (ref 11.1–15.9)
Immature Grans (Abs): 0 10*3/uL (ref 0.0–0.1)
Immature Granulocytes: 0 %
Lymphocytes Absolute: 1.7 10*3/uL (ref 0.7–3.1)
Lymphs: 36 %
MCH: 29 pg (ref 26.6–33.0)
MCHC: 32.9 g/dL (ref 31.5–35.7)
MCV: 88 fL (ref 79–97)
Monocytes Absolute: 0.4 10*3/uL (ref 0.1–0.9)
Monocytes: 8 %
Neutrophils Absolute: 2.3 10*3/uL (ref 1.4–7.0)
Neutrophils: 46 %
Platelets: 188 10*3/uL (ref 150–450)
RBC: 4.31 x10E6/uL (ref 3.77–5.28)
RDW: 11.3 % — ABNORMAL LOW (ref 11.7–15.4)
WBC: 4.9 10*3/uL (ref 3.4–10.8)

## 2020-07-25 LAB — TSH: TSH: 3.43 u[IU]/mL (ref 0.450–4.500)

## 2020-07-29 ENCOUNTER — Telehealth: Payer: Self-pay

## 2020-07-29 NOTE — Telephone Encounter (Signed)
Culture not back yet, [facility] said it would be 07/30/20, pt aware

## 2020-07-30 LAB — URINE CULTURE

## 2020-07-31 ENCOUNTER — Other Ambulatory Visit: Payer: Self-pay | Admitting: Family

## 2020-07-31 MED ORDER — AMOXICILLIN-POT CLAVULANATE 875-125 MG PO TABS: 1.0000 | ORAL_TABLET | Freq: Two times a day (BID) | ORAL | 0 refills | Status: DC

## 2020-08-05 ENCOUNTER — Ambulatory Visit: Payer: Medicare HMO | Admitting: Family

## 2020-08-08 ENCOUNTER — Telehealth: Payer: Self-pay

## 2020-08-12 ENCOUNTER — Encounter: Payer: Self-pay | Admitting: Family Medicine

## 2020-08-12 ENCOUNTER — Ambulatory Visit (INDEPENDENT_AMBULATORY_CARE_PROVIDER_SITE_OTHER): Payer: Medicare HMO | Admitting: Family Medicine

## 2020-08-12 ENCOUNTER — Other Ambulatory Visit: Payer: Self-pay

## 2020-08-12 VITALS — BP 101/67 | HR 74 | Temp 98.3°F | Ht 64.0 in | Wt 129.0 lb

## 2020-08-12 DIAGNOSIS — R110 Nausea: Secondary | ICD-10-CM | POA: Diagnosis not present

## 2020-08-12 DIAGNOSIS — R399 Unspecified symptoms and signs involving the genitourinary system: Secondary | ICD-10-CM | POA: Diagnosis not present

## 2020-08-12 LAB — MICROSCOPIC EXAMINATION

## 2020-08-12 LAB — URINALYSIS, COMPLETE
Bilirubin, UA: NEGATIVE
Glucose, UA: NEGATIVE
Ketones, UA: NEGATIVE
Leukocytes,UA: NEGATIVE
Nitrite, UA: NEGATIVE
Protein,UA: NEGATIVE
Specific Gravity, UA: 1.025 (ref 1.005–1.030)
Urobilinogen, Ur: 0.2 mg/dL (ref 0.2–1.0)
pH, UA: 5.5 (ref 5.0–7.5)

## 2020-08-12 MED ORDER — ONDANSETRON 4 MG PO TBDP: 4.0000 mg | ORAL_TABLET | Freq: Three times a day (TID) | ORAL | 0 refills | Status: DC | PRN

## 2020-08-12 NOTE — Progress Notes (Addendum)
Subjective: CC: nausea PCP: Sharion Balloon, FNP  MMN:OTRRN H Ruzich is a 54 y.o. female presenting to clinic today for:  1. Nausea Ruth Hopkins reports nausea for a few weeks that comes and goes. She was seen for nausea a few weeks ago and was diagnosed with a UTI after a urine culture. She took amoxicillin and completed the abx as prescribed. Denies fever, body aches, facial pain, vomiting, new abdominal pain, blood in her stool, or diarrhea. She does have a history of migraines. She thought that maybe her nausea was due to migraines so she has been taking her triptan more frequently. This has helped some with the nausea. She has been having more issues lately with heartburn. She takes 20 mg of Prilosec daily and mylanta. Denies chest pain, dysuria, or flank pain.   Relevant past medical, surgical, family, and social history reviewed and updated as indicated.  Allergies and medications reviewed and updated.  Allergies  Allergen Reactions  . Latex Itching and Rash  . Codeine   . Gabapentin     "fuzzy"  . Sulfa Antibiotics    Past Medical History:  Diagnosis Date  . Chronic fatigue   . Depression   . Fibromyalgia   . IBS (irritable bowel syndrome)   . Migraine   . Thyroid disease     Current Outpatient Medications:  .  acetaminophen (TYLENOL) 500 MG tablet, Take by mouth., Disp: , Rfl:  .  buPROPion (WELLBUTRIN XL) 150 MG 24 hr tablet, Take 150 mg by mouth daily., Disp: , Rfl:  .  clonazePAM (KLONOPIN) 0.5 MG tablet, Take 0.5 mg by mouth 2 (two) times daily. , Disp: , Rfl:  .  cycloSPORINE (RESTASIS) 0.05 % ophthalmic emulsion, Restasis 0.05 % eye drops in a dropperette  USE 1 DROP INTO BOTH EYES TWICE A DAY, Disp: , Rfl:  .  diclofenac (VOLTAREN) 75 MG EC tablet, Take 1 tablet (75 mg total) by mouth 2 (two) times daily., Disp: 180 tablet, Rfl: 3 .  eletriptan (RELPAX) 40 MG tablet, May repeat in 2 hours if headache persists or recurs., Disp: 12 tablet, Rfl: 0 .  estradiol  (VIVELLE-DOT) 0.075 MG/24HR, Place 1 patch onto the skin 2 (two) times a week., Disp: , Rfl:  .  HYDROcodone-acetaminophen (NORCO) 5-325 MG tablet, Take 1 tablet by mouth every 6 (six) hours as needed for moderate pain., Disp: 30 tablet, Rfl: 0 .  imipramine (TOFRANIL) 10 MG tablet, Take 3 tablets (30 mg total) by mouth at bedtime., Disp: 90 tablet, Rfl: 11 .  levothyroxine (SYNTHROID) 50 MCG tablet, TAKE 1 TABLET BY MOUTH ONCE DAILY BEFORE BREAKFAST, Disp: 90 tablet, Rfl: 3 .  methocarbamol (ROBAXIN) 500 MG tablet, Take 1 tablet (500 mg total) by mouth every 6 (six) hours as needed for muscle spasms., Disp: 30 tablet, Rfl: 5 .  mometasone (NASONEX) 50 MCG/ACT nasal spray, Place 2 sprays into the nose daily., Disp: 51 g, Rfl: 1 .  Multiple Vitamins-Minerals (MULTIVITAMIN PO), Take by mouth., Disp: , Rfl:  .  Olopatadine HCl 0.2 % SOLN, Apply 1 drop to eye in the morning and at bedtime., Disp: 2.5 mL, Rfl: 2 .  omeprazole (PRILOSEC) 20 MG capsule, Take 1 capsule (20 mg total) by mouth daily., Disp: 90 capsule, Rfl: 3 Social History   Socioeconomic History  . Marital status: Married    Spouse name: Not on file  . Number of children: Not on file  . Years of education: Not on file  . Highest  education level: Not on file  Occupational History  . Not on file  Tobacco Use  . Smoking status: Light Tobacco Smoker    Types: Cigarettes  . Smokeless tobacco: Never Used  Vaping Use  . Vaping Use: Never used  Substance and Sexual Activity  . Alcohol use: No  . Drug use: No  . Sexual activity: Not on file  Other Topics Concern  . Not on file  Social History Narrative  . Not on file   Social Determinants of Health   Financial Resource Strain:   . Difficulty of Paying Living Expenses: Not on file  Food Insecurity:   . Worried About Charity fundraiser in the Last Year: Not on file  . Ran Out of Food in the Last Year: Not on file  Transportation Needs:   . Lack of Transportation (Medical):  Not on file  . Lack of Transportation (Non-Medical): Not on file  Physical Activity:   . Days of Exercise per Week: Not on file  . Minutes of Exercise per Session: Not on file  Stress:   . Feeling of Stress : Not on file  Social Connections:   . Frequency of Communication with Friends and Family: Not on file  . Frequency of Social Gatherings with Friends and Family: Not on file  . Attends Religious Services: Not on file  . Active Member of Clubs or Organizations: Not on file  . Attends Archivist Meetings: Not on file  . Marital Status: Not on file  Intimate Partner Violence:   . Fear of Current or Ex-Partner: Not on file  . Emotionally Abused: Not on file  . Physically Abused: Not on file  . Sexually Abused: Not on file   Family History  Problem Relation Age of Onset  . Asthma Mother   . Hypothyroidism Mother   . Hypertension Mother   . Raynaud syndrome Mother   . Hypertension Father     Review of Systems  Negative unless specially indicated above in HPI.  Objective: Office vital signs reviewed. BP 101/67   Pulse 74   Temp 98.3 F (36.8 C) (Temporal)   Ht $R'5\' 4"'wR$  (1.626 m)   Wt 129 lb (58.5 kg)   BMI 22.14 kg/m   Physical Examination:  Physical Exam Vitals reviewed.  Constitutional:      General: She is not in acute distress.    Appearance: Normal appearance. She is well-developed. She is not ill-appearing, toxic-appearing or diaphoretic.  HENT:     Head: Normocephalic and atraumatic.     Right Ear: Tympanic membrane normal.     Left Ear: Tympanic membrane normal.  Eyes:     Extraocular Movements: Extraocular movements intact.     Pupils: Pupils are equal, round, and reactive to light.  Neck:     Thyroid: No thyromegaly.  Cardiovascular:     Rate and Rhythm: Normal rate and regular rhythm.     Heart sounds: Normal heart sounds. No murmur heard.   Pulmonary:     Effort: Pulmonary effort is normal. No respiratory distress.     Breath sounds:  Normal breath sounds. No wheezing.  Abdominal:     General: Bowel sounds are normal. There is no distension.     Palpations: Abdomen is soft. There is no mass.     Tenderness: There is no abdominal tenderness. There is no right CVA tenderness, left CVA tenderness, guarding or rebound.     Hernia: No hernia is present.  Musculoskeletal:  General: Normal range of motion.     Cervical back: Neck supple. No rigidity.     Right lower leg: No edema.     Left lower leg: No edema.  Skin:    General: Skin is warm and dry.  Neurological:     General: No focal deficit present.     Mental Status: She is alert and oriented to person, place, and time.     Deep Tendon Reflexes: Reflexes are normal and symmetric.  Psychiatric:        Thought Content: Thought content normal.        Judgment: Judgment normal.      Results for orders placed or performed in visit on 07/24/20  Urine Culture   Specimen: Urine   UR  Result Value Ref Range   Urine Culture, Routine Final report (A)    Organism ID, Bacteria Enterococcus faecalis (A)    ORGANISM ID, BACTERIA Comment (A)    Antimicrobial Susceptibility Comment   Microscopic Examination   Urine  Result Value Ref Range   WBC, UA 0-5 0 - 5 /hpf   RBC 0-2 0 - 2 /hpf   Epithelial Cells (non renal) None seen 0 - 10 /hpf   Bacteria, UA Few (A) None seen/Few  CMP14+EGFR  Result Value Ref Range   Glucose 82 65 - 99 mg/dL   BUN 26 (H) 6 - 24 mg/dL   Creatinine, Ser 0.66 0.57 - 1.00 mg/dL   GFR calc non Af Amer 100 >59 mL/min/1.73   GFR calc Af Amer 115 >59 mL/min/1.73   BUN/Creatinine Ratio 39 (H) 9 - 23   Sodium 138 134 - 144 mmol/L   Potassium 4.0 3.5 - 5.2 mmol/L   Chloride 102 96 - 106 mmol/L   CO2 25 20 - 29 mmol/L   Calcium 8.7 8.7 - 10.2 mg/dL   Total Protein 6.4 6.0 - 8.5 g/dL   Albumin 4.1 3.8 - 4.9 g/dL   Globulin, Total 2.3 1.5 - 4.5 g/dL   Albumin/Globulin Ratio 1.8 1.2 - 2.2   Bilirubin Total <0.2 0.0 - 1.2 mg/dL   Alkaline  Phosphatase 60 44 - 121 IU/L   AST 23 0 - 40 IU/L   ALT 19 0 - 32 IU/L  CBC with Differential/Platelet  Result Value Ref Range   WBC 4.9 3.4 - 10.8 x10E3/uL   RBC 4.31 3.77 - 5.28 x10E6/uL   Hemoglobin 12.5 11.1 - 15.9 g/dL   Hematocrit 38.0 34.0 - 46.6 %   MCV 88 79 - 97 fL   MCH 29.0 26.6 - 33.0 pg   MCHC 32.9 31 - 35 g/dL   RDW 11.3 (L) 11.7 - 15.4 %   Platelets 188 150 - 450 x10E3/uL   Neutrophils 46 Not Estab. %   Lymphs 36 Not Estab. %   Monocytes 8 Not Estab. %   Eos 9 Not Estab. %   Basos 1 Not Estab. %   Neutrophils Absolute 2.3 1.40 - 7.00 x10E3/uL   Lymphocytes Absolute 1.7 0 - 3 x10E3/uL   Monocytes Absolute 0.4 0 - 0 x10E3/uL   EOS (ABSOLUTE) 0.4 0.0 - 0.4 x10E3/uL   Basophils Absolute 0.0 0 - 0 x10E3/uL   Immature Granulocytes 0 Not Estab. %   Immature Grans (Abs) 0.0 0.0 - 0.1 x10E3/uL  Urinalysis, Complete  Result Value Ref Range   Specific Gravity, UA 1.025 1.005 - 1.030   pH, UA 6.0 5.0 - 7.5   Color, UA Yellow Yellow  Appearance Ur Clear Clear   Leukocytes,UA Negative Negative   Protein,UA Negative Negative/Trace   Glucose, UA Negative Negative   Ketones, UA Negative Negative   RBC, UA 2+ (A) Negative   Bilirubin, UA Negative Negative   Urobilinogen, Ur 0.2 0.2 - 1.0 mg/dL   Nitrite, UA Negative Negative   Microscopic Examination See below:   TSH  Result Value Ref Range   TSH 3.430 0.450 - 4.500 uIU/mL     Assessment/ Plan: Earlee was seen today for nausea.  Diagnoses and all orders for this visit:  Nausea Increase omprazole to 20 mg BID for one month since patient reports increased acid reflux, this could be cause of nauea. Zofran added. Patient to follow up if no improvement.  UTI symptoms Negative nitrates and leuks on dip. Moderate bacteria on micro. Culture pending.  -     Urinalysis, Complete -     Urine Culture  Follow up with PCP as needed.   The above assessment and management plan was discussed with the patient. The patient  verbalized understanding of and has agreed to the management plan. Patient is aware to call the clinic if symptoms persist or worsen. Patient is aware when to return to the clinic for a follow-up visit. Patient educated on when it is appropriate to go to the emergency department.   Marjorie Smolder, FNP-C [facility] [address] [place], [place] [address] [phone]

## 2020-08-12 NOTE — Patient Instructions (Addendum)
Take your Omeprazole (Prilosec) twice a day for 1 month. Take Zofran dissolvable tablet as needed for nausea.  Nausea, Adult Nausea is the feeling that you have an upset stomach or that you are about to vomit. Nausea on its own is not usually a serious concern, but it may be an early sign of a more serious medical problem. As nausea gets worse, it can lead to vomiting. If vomiting develops, or if you are not able to drink enough fluids, you are at risk of becoming dehydrated. Dehydration can make you tired and thirsty, cause you to have a dry mouth, and decrease how often you urinate. Older adults and people with other diseases or a weak disease-fighting system (immune system) are at higher risk for dehydration. The main goals of treating your nausea are:  To relieve your nausea.  To limit repeated nausea episodes.  To prevent vomiting and dehydration. Follow these instructions at home: Watch your symptoms for any changes. Tell your health care provider about them. Follow these instructions as told by your health care provider. Eating and drinking      Take an oral rehydration solution (ORS). This is a drink that is sold at pharmacies and retail stores.  Drink clear fluids slowly and in small amounts as you are able. Clear fluids include water, ice chips, low-calorie sports drinks, and fruit juice that has water added (diluted fruit juice).  Eat bland, easy-to-digest foods in small amounts as you are able. These foods include bananas, applesauce, rice, lean meats, toast, and crackers.  Avoid drinking fluids that contain a lot of sugar or caffeine, such as energy drinks, sports drinks, and soda.  Avoid alcohol.  Avoid spicy or fatty foods. General instructions  Take over-the-counter and prescription medicines only as told by your health care provider.  Rest at home while you recover.  Drink enough fluid to keep your urine pale yellow.  Breathe slowly and deeply when you feel  nauseous.  Avoid smelling things that have strong odors.  Wash your hands often using soap and water. If soap and water are not available, use hand sanitizer.  Make sure that all people in your household wash their hands well and often.  Keep all follow-up visits as told by your health care provider. This is important. Contact a health care provider if:  Your nausea gets worse.  Your nausea does not go away after two days.  You vomit.  You cannot drink fluids without vomiting.  You have any of the following: ? New symptoms. ? A fever. ? A headache. ? Muscle cramps. ? A rash. ? Pain while urinating.  You feel light-headed or dizzy. Get help right away if:  You have pain in your chest, neck, arm, or jaw.  You feel extremely weak or you faint.  You have vomit that is bright red or looks like coffee grounds.  You have bloody or black stools or stools that look like tar.  You have a severe headache, a stiff neck, or both.  You have severe pain, cramping, or bloating in your abdomen.  You have difficulty breathing or are breathing very quickly.  Your heart is beating very quickly.  Your skin feels cold and clammy.  You feel confused.  You have signs of dehydration, such as: ? Dark urine, very little urine, or no urine. ? Cracked lips. ? Dry mouth. ? Sunken eyes. ? Sleepiness. ? Weakness. These symptoms may represent a serious problem that is an emergency. Do not wait  to see if the symptoms will go away. Get medical help right away. Call your local emergency services (911 in the [place] Do not drive yourself to the hospital. Summary  Nausea is the feeling that you have an upset stomach or that you are about to vomit. Nausea on its own is not usually a serious concern, but it may be an early sign of a more serious medical problem.  If vomiting develops, or if you are not able to drink enough fluids, you are at risk of becoming dehydrated.  Follow recommendations  for eating and drinking and take over-the-counter and prescription medicines only as told by your health care provider.  Contact a health care provider right away if your symptoms worsen or you have new symptoms.  Keep all follow-up visits as told by your health care provider. This is important. This information is not intended to replace advice given to you by your health care provider. Make sure you discuss any questions you have with your health care provider. Document Revised: 02/14/2018 Document Reviewed: 02/14/2018 Elsevier Patient Education  [place].  Gastroesophageal Reflux Disease, Adult Gastroesophageal reflux (GER) happens when acid from the stomach flows up into the tube that connects the mouth and the stomach (esophagus). Normally, food travels down the esophagus and stays in the stomach to be digested. However, when a person has GER, food and stomach acid sometimes move back up into the esophagus. If this becomes a more serious problem, the person may be diagnosed with a disease called gastroesophageal reflux disease (GERD). GERD occurs when the reflux:  Happens often.  Causes frequent or severe symptoms.  Causes problems such as damage to the esophagus. When stomach acid comes in contact with the esophagus, the acid may cause soreness (inflammation) in the esophagus. Over time, GERD may create small holes (ulcers) in the lining of the esophagus. What are the causes? This condition is caused by a problem with the muscle between the esophagus and the stomach (lower esophageal sphincter, or LES). Normally, the LES muscle closes after food passes through the esophagus to the stomach. When the LES is weakened or abnormal, it does not close properly, and that allows food and stomach acid to go back up into the esophagus. The LES can be weakened by certain dietary substances, medicines, and medical conditions, including:  Tobacco use.  Pregnancy.  Having a hiatal  hernia.  Alcohol use.  Certain foods and beverages, such as coffee, chocolate, onions, and peppermint. What increases the risk? You are more likely to develop this condition if you:  Have an increased body weight.  Have a connective tissue disorder.  Use NSAID medicines. What are the signs or symptoms? Symptoms of this condition include:  Heartburn.  Difficult or painful swallowing.  The feeling of having a lump in the throat.  Abitter taste in the mouth.  Bad breath.  Having a large amount of saliva.  Having an upset or bloated stomach.  Belching.  Chest pain. Different conditions can cause chest pain. Make sure you see your health care provider if you experience chest pain.  Shortness of breath or wheezing.  Ongoing (chronic) cough or a night-time cough.  Wearing away of tooth enamel.  Weight loss. How is this diagnosed? Your health care provider will take a medical history and perform a physical exam. To determine if you have mild or severe GERD, your health care provider may also monitor how you respond to treatment. You may also have tests, including:  A test to examine your stomach and esophagus with a small camera (endoscopy).  A test thatmeasures the acidity level in your esophagus.  A test thatmeasures how much pressure is on your esophagus.  A barium swallow or modified barium swallow test to show the shape, size, and functioning of your esophagus. How is this treated? The goal of treatment is to help relieve your symptoms and to prevent complications. Treatment for this condition may vary depending on how severe your symptoms are. Your health care provider may recommend:  Changes to your diet.  Medicine.  Surgery. Follow these instructions at home: Eating and drinking   Follow a diet as recommended by your health care provider. This may involve avoiding foods and drinks such as: ? Coffee and tea (with or without caffeine). ? Drinks that  containalcohol. ? Energy drinks and sports drinks. ? Carbonated drinks or sodas. ? Chocolate and cocoa. ? Peppermint and mint flavorings. ? Garlic and onions. ? Horseradish. ? Spicy and acidic foods, including peppers, chili powder, curry powder, vinegar, hot sauces, and barbecue sauce. ? Citrus fruit juices and citrus fruits, such as oranges, lemons, and limes. ? Tomato-based foods, such as red sauce, chili, salsa, and pizza with red sauce. ? Fried and fatty foods, such as donuts, french fries, potato chips, and high-fat dressings. ? High-fat meats, such as hot dogs and fatty cuts of red and white meats, such as rib eye steak, sausage, ham, and bacon. ? High-fat dairy items, such as whole milk, butter, and cream cheese.  Eat small, frequent meals instead of large meals.  Avoid drinking large amounts of liquid with your meals.  Avoid eating meals during the 2-3 hours before bedtime.  Avoid lying down right after you eat.  Do not exercise right after you eat. Lifestyle   Do not use any products that contain nicotine or tobacco, such as cigarettes, e-cigarettes, and chewing tobacco. If you need help quitting, ask your health care provider.  Try to reduce your stress by using methods such as yoga or meditation. If you need help reducing stress, ask your health care provider.  If you are overweight, reduce your weight to an amount that is healthy for you. Ask your health care provider for guidance about a safe weight loss goal. General instructions  Pay attention to any changes in your symptoms.  Take over-the-counter and prescription medicines only as told by your health care provider. Do not take aspirin, ibuprofen, or other NSAIDs unless your health care provider told you to do so.  Wear loose-fitting clothing. Do not wear anything tight around your waist that causes pressure on your abdomen.  Raise (elevate) the head of your bed about 6 inches (15 cm).  Avoid bending over if  this makes your symptoms worse.  Keep all follow-up visits as told by your health care provider. This is important. Contact a health care provider if:  You have: ? New symptoms. ? Unexplained weight loss. ? Difficulty swallowing or it hurts to swallow. ? Wheezing or a persistent cough. ? A hoarse voice.  Your symptoms do not improve with treatment. Get help right away if you:  Have pain in your arms, neck, jaw, teeth, or back.  Feel sweaty, dizzy, or light-headed.  Have chest pain or shortness of breath.  Vomit and your vomit looks like blood or coffee grounds.  Faint.  Have stool that is bloody or black.  Cannot swallow, drink, or eat. Summary  Gastroesophageal reflux happens when acid from the  stomach flows up into the esophagus. GERD is a disease in which the reflux happens often, causes frequent or severe symptoms, or causes problems such as damage to the esophagus.  Treatment for this condition may vary depending on how severe your symptoms are. Your health care provider may recommend diet and lifestyle changes, medicine, or surgery.  Contact a health care provider if you have new or worsening symptoms.  Take over-the-counter and prescription medicines only as told by your health care provider. Do not take aspirin, ibuprofen, or other NSAIDs unless your health care provider told you to do so.  Keep all follow-up visits as told by your health care provider. This is important. This information is not intended to replace advice given to you by your health care provider. Make sure you discuss any questions you have with your health care provider. Document Revised: 03/15/2018 Document Reviewed: 03/15/2018 Elsevier Patient Education  [place].

## 2020-08-13 ENCOUNTER — Telehealth: Payer: Self-pay

## 2020-08-13 NOTE — Telephone Encounter (Signed)
The note placed in the chart at the time of visit does not indicate UTI symptoms such as dysuria, frequency or urgency. Nausea is a symptom that is not specific. It does not indicate UTI. The urine was reviewed and does not support using another antibiotic without the result of a culture. Although there were some bacteria, the WBC and Nitrate were both negative. Once her culture is complete, if it shows infection, then an antibiotic can be prescribed.

## 2020-08-13 NOTE — Telephone Encounter (Signed)
Remember, we get paper copies of these first to review. It is highly likely Tiffany reviewed the specimen using the initial report. Please do not assume it wasn't reviewed. Second. There were bacteria, but very few WBC.. Since this is equivocal, we rely on the culture which is still pending.

## 2020-08-13 NOTE — Telephone Encounter (Signed)
Pt is use to having Keflex called in before the culture comes back and if needed to be changed after the results of the culture, Can this be done and sent to [facility]

## 2020-08-13 NOTE — Telephone Encounter (Signed)
Please review Urine from yesterday it was not reviewed and patient is a little upset

## 2020-08-14 LAB — URINE CULTURE: Organism ID, Bacteria: NO GROWTH

## 2020-08-18 ENCOUNTER — Telehealth: Payer: Self-pay | Admitting: Family

## 2020-08-18 NOTE — Telephone Encounter (Signed)
Pt is still having symptoms of a UTI but was not prescribed anything, when looking in the urinalysis in the pt chart it states that there is no growth on 11/23

## 2020-08-18 NOTE — Telephone Encounter (Signed)
Patient aware and verbalizes understanding. 

## 2020-08-18 NOTE — Telephone Encounter (Signed)
Patient aware she does not have a UTI and will let us know if she does not get better.

## 2020-08-18 NOTE — Telephone Encounter (Signed)
Symptoms do not appear to be urinary.

## 2020-08-18 NOTE — Telephone Encounter (Signed)
Urine Culture, Routine Final report   Organism ID, Bacteria No growth     Culture came back - above are results.  Ordering provider is not in the office today and you are the back up.  Would you like for [place] to tell the patient no abx needed ?

## 2020-08-26 DIAGNOSIS — F411 Generalized anxiety disorder: Secondary | ICD-10-CM | POA: Diagnosis not present

## 2020-08-26 DIAGNOSIS — R69 Illness, unspecified: Secondary | ICD-10-CM | POA: Diagnosis not present

## 2020-09-02 ENCOUNTER — Ambulatory Visit (INDEPENDENT_AMBULATORY_CARE_PROVIDER_SITE_OTHER): Payer: Medicare HMO | Admitting: Family

## 2020-09-02 ENCOUNTER — Encounter: Payer: Self-pay | Admitting: Family

## 2020-09-02 ENCOUNTER — Telehealth: Payer: Self-pay

## 2020-09-02 DIAGNOSIS — M5441 Lumbago with sciatica, right side: Secondary | ICD-10-CM

## 2020-09-02 DIAGNOSIS — Z7189 Other specified counseling: Secondary | ICD-10-CM

## 2020-09-02 MED ORDER — METHOCARBAMOL 500 MG PO TABS: 500.0000 mg | ORAL_TABLET | Freq: Four times a day (QID) | ORAL | 5 refills | Status: DC | PRN

## 2020-09-02 MED ORDER — PREDNISONE 10 MG (21) PO TBPK: ORAL_TABLET | ORAL | 0 refills | Status: DC

## 2020-09-02 NOTE — Progress Notes (Signed)
   Virtual Visit via telephone Note Due to COVID-19 pandemic this visit was conducted virtually. This visit type was conducted due to national recommendations for restrictions regarding the COVID-19 Pandemic (e.g. social distancing, sheltering in place) in an effort to limit this patient's exposure and mitigate transmission in our community. All issues noted in this document were discussed and addressed.  A physical exam was not performed with this format.  I connected with Ruth Hopkins on 09/02/20 at 2:01 pm  by telephone and verified that I am speaking with the correct person using two identifiers. Ruth Hopkins is currently located at home and no noe is currently with her during visit. The provider, Evelina Dun, FNP is located in their office at time of visit.  I discussed the limitations, risks, security and privacy concerns of performing an evaluation and management service by telephone and the availability of in person appointments. I also discussed with the patient that there may be a patient responsible charge related to this service. The patient expressed understanding and agreed to proceed.   History and Present Illness:  Back Pain This is a new problem. The current episode started 1 to 4 weeks ago. The problem occurs intermittently. The problem has been waxing and waning since onset. The pain is present in the lumbar spine. The quality of the pain is described as aching. The pain radiates to the right thigh. The pain is at a severity of 7/10. The pain is moderate. Associated symptoms include numbness and tingling. Risk factors include sedentary lifestyle. She has tried bed rest, NSAIDs and muscle relaxant for the symptoms. The treatment provided mild relief.      Review of Systems  Musculoskeletal: Positive for back pain.  Neurological: Positive for tingling and numbness.  All other systems reviewed and are negative.    Observations/Objective: No SOB or distress noted    Assessment and Plan: 1. Acute right-sided low back pain with right-sided sciatica Rest Ice  ROM exercise  Continue Diclofenac BID - predniSONE (STERAPRED UNI-PAK 21 TAB) 10 MG (21) TBPK tablet; Use as directed  Dispense: 21 tablet; Refill: 0 - methocarbamol (ROBAXIN) 500 MG tablet; Take 1 tablet (500 mg total) by mouth every 6 (six) hours as needed for muscle spasms.  Dispense: 30 tablet; Refill: 5  2. Educated about COVID-19 virus infection Pt still uncertain on getting vaccine. Will continue to think about it.      I discussed the assessment and treatment plan with the patient. The patient was provided an opportunity to ask questions and all were answered. The patient agreed with the plan and demonstrated an understanding of the instructions.   The patient was advised to call back or seek an in-person evaluation if the symptoms worsen or if the condition fails to improve as anticipated.  The above assessment and management plan was discussed with the patient. The patient verbalized understanding of and has agreed to the management plan. Patient is aware to call the clinic if symptoms persist or worsen. Patient is aware when to return to the clinic for a follow-up visit. Patient educated on when it is appropriate to go to the emergency department.   Time call ended:  2:12 pm   I provided 11 minutes of non-face-to-face time during this encounter.    Evelina Dun, FNP

## 2020-09-03 NOTE — Telephone Encounter (Signed)
FYI

## 2020-09-10 ENCOUNTER — Ambulatory Visit (INDEPENDENT_AMBULATORY_CARE_PROVIDER_SITE_OTHER): Payer: Medicare HMO | Admitting: Nurse Practitioner

## 2020-09-10 ENCOUNTER — Telehealth: Payer: Self-pay

## 2020-09-10 ENCOUNTER — Encounter: Payer: Self-pay | Admitting: Nurse Practitioner

## 2020-09-10 DIAGNOSIS — M797 Fibromyalgia: Secondary | ICD-10-CM

## 2020-09-10 NOTE — Progress Notes (Signed)
   Virtual Visit via telephone Note Due to COVID-19 pandemic this visit was conducted virtually. This visit type was conducted due to national recommendations for restrictions regarding the COVID-19 Pandemic (e.g. social distancing, sheltering in place) in an effort to limit this patient's exposure and mitigate transmission in our community. All issues noted in this document were discussed and addressed.  A physical exam was not performed with this format.  I connected with Ruth Hopkins on 09/10/20 at 1:35 by telephone and verified that I am speaking with the correct person using two identifiers. Ruth Hopkins is currently located at home and no ne is currently with  her during visit. The provider, Mary-Margaret Hassell Done, FNP is located in their office at time of visit.  I discussed the limitations, risks, security and privacy concerns of performing an evaluation and management service by telephone and the availability of in person appointments. I also discussed with the patient that there may be a patient responsible charge related to this service. The patient expressed understanding and agreed to proceed.   History and Present Illness:   Chief Complaint: Fibromyalgia   HPI Patient is having a flare up of her fibromyalgia. She says her left shoulder blade is hurting and pain is radiating up to her neck. Laying down increases her pain. Rates pain 10/10 currently. She was rx prednisone and robaxin on 09/02/20 but she says that she did not take the steroids. She did take the robaxin this ornin along with her vicodin.   Review of Systems  Musculoskeletal: Positive for myalgias.  All other systems reviewed and are negative.    Observations/Objective: Alert and oriented- answers all questions appropriately No distress    Assessment and Plan: Ruth Hopkins in today with chief complaint of Fibromyalgia   1. Fibromyalgia Take steroids as prescribed  robaxin as prescribed Pain meds as  prescribed Exercise- stretches RTO prn      Follow Up Instructions: prn    I discussed the assessment and treatment plan with the patient. The patient was provided an opportunity to ask questions and all were answered. The patient agreed with the plan and demonstrated an understanding of the instructions.   The patient was advised to call back or seek an in-person evaluation if the symptoms worsen or if the condition fails to improve as anticipated.  The above assessment and management plan was discussed with the patient. The patient verbalized understanding of and has agreed to the management plan. Patient is aware to call the clinic if symptoms persist or worsen. Patient is aware when to return to the clinic for a follow-up visit. Patient educated on when it is appropriate to go to the emergency department.   Time call ended:  1:48  I provided 13 minutes of non-face-to-face time during this encounter.    Mary-Margaret Hassell Done, FNP

## 2020-09-10 NOTE — Telephone Encounter (Signed)
Pt states she hardly ever takes but this dosage does not help. Please advise

## 2020-09-11 DIAGNOSIS — R300 Dysuria: Secondary | ICD-10-CM | POA: Diagnosis not present

## 2020-09-11 DIAGNOSIS — M9901 Segmental and somatic dysfunction of cervical region: Secondary | ICD-10-CM | POA: Diagnosis not present

## 2020-09-11 NOTE — Telephone Encounter (Signed)
Patient aware and verbalizes understanding. 

## 2020-09-11 NOTE — Telephone Encounter (Signed)
Pt's fibromyalgia is acting up and she said meds are not working. Please call

## 2020-09-11 NOTE — Telephone Encounter (Signed)
Norco is not recommended for Fibromyalgia pain. We can not give high dose of Norco because patient already takes Klonopin. We could try gabapentin?

## 2020-09-11 NOTE — Telephone Encounter (Signed)
Waiting on provider to response

## 2020-09-17 DIAGNOSIS — Z79891 Long term (current) use of opiate analgesic: Secondary | ICD-10-CM | POA: Diagnosis not present

## 2020-09-17 DIAGNOSIS — M5412 Radiculopathy, cervical region: Secondary | ICD-10-CM | POA: Diagnosis not present

## 2020-09-17 DIAGNOSIS — M5030 Other cervical disc degeneration, unspecified cervical region: Secondary | ICD-10-CM | POA: Diagnosis not present

## 2020-09-24 DIAGNOSIS — Z86000 Personal history of in-situ neoplasm of breast: Secondary | ICD-10-CM | POA: Diagnosis not present

## 2020-09-24 DIAGNOSIS — R922 Inconclusive mammogram: Secondary | ICD-10-CM | POA: Diagnosis not present

## 2020-09-24 DIAGNOSIS — Z1239 Encounter for other screening for malignant neoplasm of breast: Secondary | ICD-10-CM | POA: Diagnosis not present

## 2020-09-24 DIAGNOSIS — Z9189 Other specified personal risk factors, not elsewhere classified: Secondary | ICD-10-CM | POA: Diagnosis not present

## 2020-09-27 DIAGNOSIS — Z20822 Contact with and (suspected) exposure to covid-19: Secondary | ICD-10-CM | POA: Diagnosis not present

## 2020-10-03 ENCOUNTER — Encounter: Payer: Self-pay | Admitting: Family

## 2020-10-03 ENCOUNTER — Ambulatory Visit (INDEPENDENT_AMBULATORY_CARE_PROVIDER_SITE_OTHER): Payer: Medicare HMO | Admitting: Family

## 2020-10-03 ENCOUNTER — Telehealth: Payer: Self-pay | Admitting: Family

## 2020-10-03 DIAGNOSIS — R399 Unspecified symptoms and signs involving the genitourinary system: Secondary | ICD-10-CM | POA: Diagnosis not present

## 2020-10-03 LAB — MICROSCOPIC EXAMINATION

## 2020-10-03 LAB — URINALYSIS, COMPLETE
Bilirubin, UA: NEGATIVE
Glucose, UA: NEGATIVE
Leukocytes,UA: NEGATIVE
Nitrite, UA: NEGATIVE
Protein,UA: NEGATIVE
Specific Gravity, UA: 1.025 (ref 1.005–1.030)
Urobilinogen, Ur: 0.2 mg/dL (ref 0.2–1.0)
pH, UA: 6 (ref 5.0–7.5)

## 2020-10-03 NOTE — Telephone Encounter (Signed)
Pt calling to see if an antibiotic will be called in based on her urine sample

## 2020-10-03 NOTE — Progress Notes (Signed)
   Virtual Visit via telephone Note Due to COVID-19 pandemic this visit was conducted virtually. This visit type was conducted due to national recommendations for restrictions regarding the COVID-19 Pandemic (e.g. social distancing, sheltering in place) in an effort to limit this patient's exposure and mitigate transmission in our community. All issues noted in this document were discussed and addressed.  A physical exam was not performed with this format.  I connected with Ruth Hopkins on 10/03/20 at 9:08 Am  by telephone and verified that I am speaking with the correct person using two identifiers. Ruth Hopkins is currently located at home and no one is currently with her during visit. The provider, Evelina Dun, FNP is located in their office at time of visit.  I discussed the limitations, risks, security and privacy concerns of performing an evaluation and management service by telephone and the availability of in person appointments. I also discussed with the patient that there may be a patient responsible charge related to this service. The patient expressed understanding and agreed to proceed.   History and Present Illness:  Urinary Frequency  This is a new problem. The pain is at a severity of 2/10. The pain is mild. Associated symptoms include frequency and urgency. Pertinent negatives include no discharge, flank pain, hematuria, hesitancy, nausea or vomiting. She has tried increased fluids for the symptoms. The treatment provided mild relief.      Review of Systems  Gastrointestinal: Negative for nausea and vomiting.  Genitourinary: Positive for frequency and urgency. Negative for flank pain, hematuria and hesitancy.  All other systems reviewed and are negative.    Observations/Objective: No SOB or distress noted   Assessment and Plan: 1. UTI symptoms Force fluids AZO over the counter X2 days Pt will come in and leave urine, will prescribe Keflex is positive at that time.   RTO if symptoms worsen or do not improve  Culture pending - Urinalysis, Complete - Urine Culture     I discussed the assessment and treatment plan with the patient. The patient was provided an opportunity to ask questions and all were answered. The patient agreed with the plan and demonstrated an understanding of the instructions.   The patient was advised to call back or seek an in-person evaluation if the symptoms worsen or if the condition fails to improve as anticipated.  The above assessment and management plan was discussed with the patient. The patient verbalized understanding of and has agreed to the management plan. Patient is aware to call the clinic if symptoms persist or worsen. Patient is aware when to return to the clinic for a follow-up visit. Patient educated on when it is appropriate to go to the emergency department.   Time call ended:  9:19 AM   I provided 11 minutes of non-face-to-face time during this encounter.    Evelina Dun, FNP

## 2020-10-06 LAB — URINE CULTURE

## 2020-10-07 NOTE — Telephone Encounter (Signed)
See result note.  

## 2020-10-30 DIAGNOSIS — Z1231 Encounter for screening mammogram for malignant neoplasm of breast: Secondary | ICD-10-CM | POA: Diagnosis not present

## 2020-10-30 DIAGNOSIS — Z9189 Other specified personal risk factors, not elsewhere classified: Secondary | ICD-10-CM | POA: Diagnosis not present

## 2020-10-30 DIAGNOSIS — R922 Inconclusive mammogram: Secondary | ICD-10-CM | POA: Diagnosis not present

## 2020-10-30 DIAGNOSIS — Z86000 Personal history of in-situ neoplasm of breast: Secondary | ICD-10-CM | POA: Diagnosis not present

## 2020-10-30 LAB — HM MAMMOGRAPHY

## 2020-11-10 ENCOUNTER — Ambulatory Visit (INDEPENDENT_AMBULATORY_CARE_PROVIDER_SITE_OTHER): Payer: Medicare HMO | Admitting: Family

## 2020-11-10 ENCOUNTER — Encounter: Payer: Self-pay | Admitting: Family

## 2020-11-10 DIAGNOSIS — M546 Pain in thoracic spine: Secondary | ICD-10-CM | POA: Diagnosis not present

## 2020-11-10 DIAGNOSIS — M797 Fibromyalgia: Secondary | ICD-10-CM

## 2020-11-10 MED ORDER — PREDNISONE 10 MG (21) PO TBPK: ORAL_TABLET | ORAL | 0 refills | Status: DC

## 2020-11-10 NOTE — Progress Notes (Signed)
   Virtual Visit via telephone Note Due to COVID-19 pandemic this visit was conducted virtually. This visit type was conducted due to national recommendations for restrictions regarding the COVID-19 Pandemic (e.g. social distancing, sheltering in place) in an effort to limit this patient's exposure and mitigate transmission in our community. All issues noted in this document were discussed and addressed.  A physical exam was not performed with this format.  I connected with Ruth Hopkins on 11/10/20 at 3:42 pm by telephone and verified that I am speaking with the correct person using two identifiers. Ruth Hopkins is currently located at home and no one is currently with her during visit. The provider, Evelina Dun, FNP is located in their office at time of visit.  I discussed the limitations, risks, security and privacy concerns of performing an evaluation and management service by telephone and the availability of in person appointments. I also discussed with the patient that there may be a patient responsible charge related to this service. The patient expressed understanding and agreed to proceed.   History and Present Illness:  Back Pain This is a new problem. The current episode started today. The problem occurs constantly. The problem has been waxing and waning since onset. The pain is present in the thoracic spine. The quality of the pain is described as aching. The pain is at a severity of 8/10. The pain is moderate. The symptoms are aggravated by bending, twisting and standing. Pertinent negatives include no bowel incontinence, dysuria, pelvic pain, tingling or weakness. She has tried bed rest for the symptoms. The treatment provided mild relief.      Review of Systems  Gastrointestinal: Negative for bowel incontinence.  Genitourinary: Negative for dysuria and pelvic pain.  Musculoskeletal: Positive for back pain.  Neurological: Negative for tingling and weakness.  All other systems  reviewed and are negative.    Observations/Objective: No SOB or distress noted  Assessment and Plan: 1. Acute left-sided thoracic back pain Rest ROM exercises encouraged  Continue diclofenac and robaxin Call if symptoms worsen or do not improve  - predniSONE (STERAPRED UNI-PAK 21 TAB) 10 MG (21) TBPK tablet; Use as directed  Dispense: 21 tablet; Refill: 0  2. Fibromyalgia - predniSONE (STERAPRED UNI-PAK 21 TAB) 10 MG (21) TBPK tablet; Use as directed  Dispense: 21 tablet; Refill: 0    I discussed the assessment and treatment plan with the patient. The patient was provided an opportunity to ask questions and all were answered. The patient agreed with the plan and demonstrated an understanding of the instructions.   The patient was advised to call back or seek an in-person evaluation if the symptoms worsen or if the condition fails to improve as anticipated.  The above assessment and management plan was discussed with the patient. The patient verbalized understanding of and has agreed to the management plan. Patient is aware to call the clinic if symptoms persist or worsen. Patient is aware when to return to the clinic for a follow-up visit. Patient educated on when it is appropriate to go to the emergency department.   Time call ended:  3:53 pm   I provided 11 minutes of non-face-to-face time during this encounter.    Evelina Dun, FNP

## 2020-11-11 ENCOUNTER — Telehealth: Payer: Self-pay | Admitting: Dermatology

## 2020-11-11 NOTE — Telephone Encounter (Signed)
Says she has place on right buttocks that needs removal that ST has seen. Wants to know code for this so she can find out if insurance will cover it.

## 2020-11-12 NOTE — Telephone Encounter (Signed)
Patient has a benign cyst on her Buttock according to her office visit in march.  Patient wants to know codes to see if insurance will cover- codes were given to patient in march but she lost them and never called her insurance.  I looked back at her paper chart visit in march and there is no size for the cyst- so can't give exact code- all  I know from march visit per  tafeens note is benign cyst.   Patient was on hold while I was looking for her chart and hung up- tried calling patient back and no answer- left message for patient to call us back.

## 2020-11-25 DIAGNOSIS — F411 Generalized anxiety disorder: Secondary | ICD-10-CM | POA: Diagnosis not present

## 2020-11-25 DIAGNOSIS — R69 Illness, unspecified: Secondary | ICD-10-CM | POA: Diagnosis not present

## 2020-11-27 ENCOUNTER — Ambulatory Visit: Payer: Medicare HMO | Admitting: Podiatry

## 2020-11-27 ENCOUNTER — Other Ambulatory Visit: Payer: Self-pay

## 2020-11-27 ENCOUNTER — Ambulatory Visit (INDEPENDENT_AMBULATORY_CARE_PROVIDER_SITE_OTHER): Payer: Medicare HMO

## 2020-11-27 ENCOUNTER — Encounter: Payer: Self-pay | Admitting: Podiatry

## 2020-11-27 DIAGNOSIS — M2042 Other hammer toe(s) (acquired), left foot: Secondary | ICD-10-CM

## 2020-11-27 DIAGNOSIS — M779 Enthesopathy, unspecified: Secondary | ICD-10-CM

## 2020-11-27 MED ORDER — TRIAMCINOLONE ACETONIDE 10 MG/ML IJ SUSP
10.0000 mg | Freq: Once | INTRAMUSCULAR | Status: AC
  Administered 2020-11-27: 10 mg

## 2020-11-27 NOTE — Progress Notes (Signed)
Subjective:   Patient ID: Ruth Hopkins, female   DOB: 55 y.o.   MRN: [id number]   HPI Patient presents with exquisite discomfort in the sinus tarsi bilateral and distal keratotic lesion mildly tender second left   ROS      Objective:  Physical Exam  Inflammatory sinus tarsitis capsulitis bilateral with slight digital deformity     Assessment:  Sinus tarsitis bilateral with inflammation     Plan:  Sterile prep injected each sinus tarsi 3 mg Kenalog 5 mg Xylocaine advised on continued supportive shoe gear and increased activity and reappoint as needed and applied pad to second digit left foot

## 2020-12-01 ENCOUNTER — Telehealth: Payer: Self-pay | Admitting: Podiatry

## 2020-12-01 NOTE — Telephone Encounter (Signed)
Pt left message with front desk for me to call her last Friday.  I called pt today and it went to husbands voicemail and I left message for pt to call me back.

## 2020-12-24 ENCOUNTER — Other Ambulatory Visit: Payer: Self-pay | Admitting: Family

## 2020-12-29 DIAGNOSIS — Z0100 Encounter for examination of eyes and vision without abnormal findings: Secondary | ICD-10-CM | POA: Diagnosis not present

## 2020-12-29 DIAGNOSIS — H5200 Hypermetropia, unspecified eye: Secondary | ICD-10-CM | POA: Diagnosis not present

## 2021-01-19 ENCOUNTER — Encounter: Payer: Self-pay | Admitting: Family Medicine

## 2021-01-21 ENCOUNTER — Other Ambulatory Visit: Payer: Self-pay

## 2021-01-21 ENCOUNTER — Ambulatory Visit (INDEPENDENT_AMBULATORY_CARE_PROVIDER_SITE_OTHER): Payer: Medicare HMO | Admitting: Family

## 2021-01-21 ENCOUNTER — Encounter: Payer: Self-pay | Admitting: Family

## 2021-01-21 VITALS — BP 103/69 | HR 75 | Temp 98.1°F | Ht 64.0 in | Wt 131.4 lb

## 2021-01-21 DIAGNOSIS — Z1159 Encounter for screening for other viral diseases: Secondary | ICD-10-CM | POA: Diagnosis not present

## 2021-01-21 DIAGNOSIS — Z114 Encounter for screening for human immunodeficiency virus [HIV]: Secondary | ICD-10-CM | POA: Diagnosis not present

## 2021-01-21 DIAGNOSIS — E039 Hypothyroidism, unspecified: Secondary | ICD-10-CM

## 2021-01-21 DIAGNOSIS — M797 Fibromyalgia: Secondary | ICD-10-CM | POA: Diagnosis not present

## 2021-01-21 DIAGNOSIS — N898 Other specified noninflammatory disorders of vagina: Secondary | ICD-10-CM | POA: Diagnosis not present

## 2021-01-21 DIAGNOSIS — F17200 Nicotine dependence, unspecified, uncomplicated: Secondary | ICD-10-CM

## 2021-01-21 DIAGNOSIS — G43901 Migraine, unspecified, not intractable, with status migrainosus: Secondary | ICD-10-CM | POA: Diagnosis not present

## 2021-01-21 DIAGNOSIS — K219 Gastro-esophageal reflux disease without esophagitis: Secondary | ICD-10-CM

## 2021-01-21 DIAGNOSIS — F419 Anxiety disorder, unspecified: Secondary | ICD-10-CM | POA: Diagnosis not present

## 2021-01-21 DIAGNOSIS — R69 Illness, unspecified: Secondary | ICD-10-CM | POA: Diagnosis not present

## 2021-01-21 DIAGNOSIS — F339 Major depressive disorder, recurrent, unspecified: Secondary | ICD-10-CM

## 2021-01-21 LAB — WET PREP FOR TRICH, YEAST, CLUE
Clue Cell Exam: NEGATIVE
Trichomonas Exam: NEGATIVE
Yeast Exam: NEGATIVE

## 2021-01-21 MED ORDER — ELETRIPTAN HYDROBROMIDE 40 MG PO TABS: ORAL_TABLET | ORAL | 0 refills | Status: DC

## 2021-01-21 NOTE — Patient Instructions (Signed)
Managing the Challenge of Quitting Smoking Quitting smoking is a physical and mental challenge. You will face cravings, withdrawal symptoms, and temptation. Before quitting, work with your health care provider to make a plan that can help you manage quitting. Preparation can help you quit and keep you from giving in. How to manage lifestyle changes Managing stress Stress can make you want to smoke, and wanting to smoke may cause stress. It is important to find ways to manage your stress. You might try some of the following:  Practice relaxation techniques. ? Breathe slowly and deeply, in through your nose and out through your mouth. ? Listen to music. ? Soak in a bath or take a shower. ? Imagine a peaceful place or vacation.  Get some support. ? Talk with family or friends about your stress. ? Join a support group. ? Talk with a counselor or therapist.  Get some physical activity. ? Go for a walk, run, or bike ride. ? Play a favorite sport. ? Practice yoga.   Medicines Talk with your health care provider about medicines that might help you deal with cravings and make quitting easier for you. Relationships Social situations can be difficult when you are quitting smoking. To manage this, you can:  Avoid parties and other social situations where people might be smoking.  Avoid alcohol.  Leave right away if you have the urge to smoke.  Explain to your family and friends that you are quitting smoking. Ask for support and let them know you might be a bit grumpy.  Plan activities where smoking is not an option. General instructions Be aware that many people gain weight after they quit smoking. However, not everyone does. To keep from gaining weight, have a plan in place before you quit and stick to the plan after you quit. Your plan should include:  Having healthy snacks. When you have a craving, it may help to: ? Eat popcorn, carrots, celery, or other cut vegetables. ? Chew  sugar-free gum.  Changing how you eat. ? Eat small portion sizes at meals. ? Eat 4-6 small meals throughout the day instead of 1-2 large meals a day. ? Be mindful when you eat. Do not watch television or do other things that might distract you as you eat.  Exercising regularly. ? Make time to exercise each day. If you do not have time for a long workout, do short bouts of exercise for 5-10 minutes several times a day. ? Do some form of strengthening exercise, such as weight lifting. ? Do some exercise that gets your heart beating and causes you to breathe deeply, such as walking fast, running, swimming, or biking. This is very important.  Drinking plenty of water or other low-calorie or no-calorie drinks. Drink 6-8 glasses of water daily.   How to recognize withdrawal symptoms Your body and mind may experience discomfort as you try to get used to not having nicotine in your system. These effects are called withdrawal symptoms. They may include:  Feeling hungrier than normal.  Having trouble concentrating.  Feeling irritable or restless.  Having trouble sleeping.  Feeling depressed.  Craving a cigarette. To manage withdrawal symptoms:  Avoid places, people, and activities that trigger your cravings.  Remember why you want to quit.  Get plenty of sleep.  Avoid coffee and other caffeinated drinks. These may worsen some of your symptoms. These symptoms may surprise you. But be assured that they are normal to have when quitting smoking. How to manage cravings   Come up with a plan for how to deal with your cravings. The plan should include the following:  A definition of the specific situation you want to deal with.  An alternative action you will take.  A clear idea for how this action will help.  The name of someone who might help you with this. Cravings usually last for 5-10 minutes. Consider taking the following actions to help you with your plan to deal with  cravings:  Keep your mouth busy. ? Chew sugar-free gum. ? Suck on hard candies or a straw. ? Brush your teeth.  Keep your hands and body busy. ? Change to a different activity right away. ? Squeeze or play with a ball. ? Do an activity or a hobby, such as making bead jewelry, practicing needlepoint, or working with wood. ? Mix up your normal routine. ? Take a short exercise break. Go for a quick walk or run up and down stairs.  Focus on doing something kind or helpful for someone else.  Call a friend or family member to talk during a craving.  Join a support group.  Contact a quitline. Where to find support To get help or find a support group:  Call the National Cancer Institute's Smoking Quitline: 1-800-QUIT NOW (784-8669)  Visit the website of the Substance Abuse and Mental Health Services Administration: www.samhsa.gov  Text QUIT to SmokefreeTXT: 478848 Where to find more information Visit these websites to find more information on quitting smoking:  National Cancer Institute: www.smokefree.gov  American Lung Association: www.lung.org  American Cancer Society: www.cancer.org  Centers for Disease Control and Prevention: www.cdc.gov  American Heart Association: www.heart.org Contact a health care provider if:  You want to change your plan for quitting.  The medicines you are taking are not helping.  Your eating feels out of control or you cannot sleep. Get help right away if:  You feel depressed or become very anxious. Summary  Quitting smoking is a physical and mental challenge. You will face cravings, withdrawal symptoms, and temptation to smoke again. Preparation can help you as you go through these challenges.  Try different techniques to manage stress, handle social situations, and prevent weight gain.  You can deal with cravings by keeping your mouth busy (such as by chewing gum), keeping your hands and body busy, calling family or friends, or  contacting a quitline for people who want to quit smoking.  You can deal with withdrawal symptoms by avoiding places where people smoke, getting plenty of rest, and avoiding drinks with caffeine. This information is not intended to replace advice given to you by your health care provider. Make sure you discuss any questions you have with your health care provider. Document Revised: 06/26/2019 Document Reviewed: 06/26/2019 Elsevier Patient Education  2021 Elsevier Inc.  

## 2021-01-21 NOTE — Progress Notes (Signed)
Subjective:    Patient ID: Ruth Hopkins, female    DOB: 03-Jun-1966, 54 y.o.   MRN: [id number]  Chief Complaint  Patient presents with  . Medical Management of Chronic Issues  . vaginal irritation    Wants to speak with you before wet prep     PT presents to the office today for chronic follow up. She is followed by psychiatrist 3 months for hot flashes and GAD.She is currently taking estradiol patch and gradually decreasing and trying to add other medications. She has tried gabapentin, Lexapro, and Effexor, and wellbutrin without success. Thyroid Problem Presents for follow-up visit. Symptoms include constipation and depressed mood. Patient reports no anxiety, dry skin, fatigue or hair loss. The symptoms have been stable.  Migraine  This is a chronic problem. The current episode started more than 1 year ago. The problem occurs intermittently (5-6 times a month). The pain is located in the left unilateral region. Associated symptoms include phonophobia and photophobia.  Gastroesophageal Reflux She complains of belching and heartburn. This is a chronic problem. The current episode started more than 1 year ago. The problem occurs occasionally. The problem has been waxing and waning. Pertinent negatives include no fatigue. She has tried a PPI for the symptoms. The treatment provided moderate relief.  Anxiety Presents for follow-up visit. Symptoms include depressed mood, excessive worry, irritability and restlessness. Patient reports no nervous/anxious behavior. Symptoms occur most days. The severity of symptoms is moderate.    Depression        This is a chronic problem.  The current episode started more than 1 year ago.   The onset quality is gradual.   The problem occurs intermittently.  Associated symptoms include helplessness, hopelessness, irritable, restlessness and sad.  Associated symptoms include no fatigue.  Past medical history includes thyroid problem and anxiety.   Nicotine  Dependence Presents for follow-up visit. Symptoms include irritability. Symptoms are negative for fatigue. Her urge triggers include company of smokers. The symptoms have been stable. She smokes < 1/2 a pack of cigarettes per day.  Vaginal Itching The patient's primary symptoms include genital itching. The patient's pertinent negatives include no vaginal bleeding or vaginal discharge. This is a new problem. The current episode started 1 to 4 weeks ago. The problem occurs intermittently. Associated symptoms include constipation.      Review of Systems  Constitutional: Positive for irritability. Negative for fatigue.  Eyes: Positive for photophobia.  Gastrointestinal: Positive for constipation and heartburn.  Genitourinary: Negative for vaginal discharge.  Psychiatric/Behavioral: Positive for depression. The patient is not nervous/anxious.   All other systems reviewed and are negative.      Objective:   Physical Exam Vitals reviewed.  Constitutional:      General: She is irritable. She is not in acute distress.    Appearance: She is well-developed.  HENT:     Head: Normocephalic and atraumatic.     Right Ear: Tympanic membrane normal.     Left Ear: Tympanic membrane normal.  Eyes:     Pupils: Pupils are equal, round, and reactive to light.  Neck:     Thyroid: No thyromegaly.  Cardiovascular:     Rate and Rhythm: Normal rate and regular rhythm.     Heart sounds: Normal heart sounds. No murmur heard.   Pulmonary:     Effort: Pulmonary effort is normal. No respiratory distress.     Breath sounds: Normal breath sounds. No wheezing.  Abdominal:     General: Bowel sounds are  normal. There is no distension.     Palpations: Abdomen is soft.     Tenderness: There is no abdominal tenderness.  Musculoskeletal:        General: No tenderness. Normal range of motion.     Cervical back: Normal range of motion and neck supple.  Skin:    General: Skin is warm and dry.  Neurological:      Mental Status: She is alert and oriented to person, place, and time.     Cranial Nerves: No cranial nerve deficit.     Deep Tendon Reflexes: Reflexes are normal and symmetric.  Psychiatric:        Behavior: Behavior normal.        Thought Content: Thought content normal.        Judgment: Judgment normal.       BP 103/69   Pulse 75   Temp 98.1 F (36.7 C) (Temporal)   Ht _0  (1.626 m)   Wt 131 lb 6.4 oz (59.6 kg)   BMI 22.55 kg/m      Assessment & Plan:  Ruth Hopkins comes in today with chief complaint of Medical Management of Chronic Issues and vaginal irritation (Wants to speak with you before wet prep )   Diagnosis and orders addressed:  1. Migraine with status migrainosus, not intractable, unspecified migraine type - CMP14+EGFR - CBC with Differential/Platelet - eletriptan (RELPAX) 40 MG tablet; May repeat in 2 hours if headache persists or recurs.  Dispense: 12 tablet; Refill: 0  2. Gastroesophageal reflux disease, unspecified whether esophagitis present - CMP14+EGFR - CBC with Differential/Platelet  3. Acquired hypothyroidism - CMP14+EGFR - CBC with Differential/Platelet  4. Anxiety  - CMP14+EGFR - CBC with Differential/Platelet  5. Depression, recurrent ([place]) - CMP14+EGFR - CBC with Differential/Platelet  6. Fibromyalgia  - CMP14+EGFR - CBC with Differential/Platelet  7. Smoker - CMP14+EGFR - CBC with Differential/Platelet  8. Need for hepatitis C screening test  - Hepatitis C antibody - CMP14+EGFR - CBC with Differential/Platelet  9. Screening for HIV (human immunodeficiency virus) - HIV Antibody (routine testing w rflx) - CMP14+EGFR - CBC with Differential/Platelet  10. Vagina itching  - CMP14+EGFR - CBC with Differential/Platelet - WET PREP FOR TRICH, YEAST, CLUE   Labs pending Health Maintenance reviewed Diet and exercise encouraged  Follow up plan: 6 months    Evelina Dun, FNP

## 2021-01-22 LAB — CMP14+EGFR
ALT: 21 IU/L (ref 0–32)
AST: 25 IU/L (ref 0–40)
Albumin/Globulin Ratio: 1.8 (ref 1.2–2.2)
Albumin: 4.4 g/dL (ref 3.8–4.9)
Alkaline Phosphatase: 73 IU/L (ref 44–121)
BUN/Creatinine Ratio: 41 — ABNORMAL HIGH (ref 9–23)
BUN: 31 mg/dL — ABNORMAL HIGH (ref 6–24)
Bilirubin Total: <0.2 mg/dL (ref 0.0–1.2)
CO2: 27 mmol/L (ref 20–29)
Calcium: 9.2 mg/dL (ref 8.7–10.2)
Chloride: 103 mmol/L (ref 96–106)
Creatinine, Ser: 0.75 mg/dL (ref 0.57–1.00)
Globulin, Total: 2.5 g/dL (ref 1.5–4.5)
Glucose: 81 mg/dL (ref 65–99)
Potassium: 4.1 mmol/L (ref 3.5–5.2)
Sodium: 145 mmol/L — ABNORMAL HIGH (ref 134–144)
Total Protein: 6.9 g/dL (ref 6.0–8.5)
eGFR: 94 mL/min/{1.73_m2} (ref >59)

## 2021-01-22 LAB — CBC WITH DIFFERENTIAL/PLATELET
Basophils Absolute: 0.1 10*3/uL (ref 0.0–0.2)
Basos: 1 %
EOS (ABSOLUTE): 0.3 10*3/uL (ref 0.0–0.4)
Eos: 5 %
Hematocrit: 42.4 % (ref 34.0–46.6)
Hemoglobin: 13.6 g/dL (ref 11.1–15.9)
Immature Grans (Abs): 0 10*3/uL (ref 0.0–0.1)
Immature Granulocytes: 0 %
Lymphocytes Absolute: 2 10*3/uL (ref 0.7–3.1)
Lymphs: 35 %
MCH: 28.8 pg (ref 26.6–33.0)
MCHC: 32.1 g/dL (ref 31.5–35.7)
MCV: 90 fL (ref 79–97)
Monocytes Absolute: 0.4 10*3/uL (ref 0.1–0.9)
Monocytes: 8 %
Neutrophils Absolute: 2.9 10*3/uL (ref 1.4–7.0)
Neutrophils: 51 %
Platelets: 188 10*3/uL (ref 150–450)
RBC: 4.73 x10E6/uL (ref 3.77–5.28)
RDW: 11.8 % (ref 11.7–15.4)
WBC: 5.7 10*3/uL (ref 3.4–10.8)

## 2021-01-22 LAB — HEPATITIS C ANTIBODY: Hep C Virus Ab: <0.1 s/co ratio (ref 0.0–0.9)

## 2021-01-22 LAB — HIV ANTIBODY (ROUTINE TESTING W REFLEX): HIV Screen 4th Generation wRfx: NONREACTIVE

## 2021-01-26 ENCOUNTER — Other Ambulatory Visit: Payer: Self-pay

## 2021-01-26 ENCOUNTER — Ambulatory Visit: Payer: Medicare HMO | Admitting: Dermatology

## 2021-01-26 DIAGNOSIS — L851 Acquired keratosis [keratoderma] palmaris et plantaris: Secondary | ICD-10-CM | POA: Diagnosis not present

## 2021-01-26 DIAGNOSIS — L720 Epidermal cyst: Secondary | ICD-10-CM

## 2021-01-26 DIAGNOSIS — L700 Acne vulgaris: Secondary | ICD-10-CM | POA: Diagnosis not present

## 2021-01-26 DIAGNOSIS — Z1283 Encounter for screening for malignant neoplasm of skin: Secondary | ICD-10-CM

## 2021-01-26 DIAGNOSIS — D1801 Hemangioma of skin and subcutaneous tissue: Secondary | ICD-10-CM

## 2021-01-26 DIAGNOSIS — D239 Other benign neoplasm of skin, unspecified: Secondary | ICD-10-CM

## 2021-01-26 DIAGNOSIS — D2371 Other benign neoplasm of skin of right lower limb, including hip: Secondary | ICD-10-CM | POA: Diagnosis not present

## 2021-01-26 DIAGNOSIS — L309 Dermatitis, unspecified: Secondary | ICD-10-CM | POA: Diagnosis not present

## 2021-01-26 MED ORDER — TACROLIMUS 0.1 % EX OINT
TOPICAL_OINTMENT | Freq: Two times a day (BID) | CUTANEOUS | 3 refills | Status: DC
Start: 2021-01-26 — End: 2021-09-08

## 2021-01-26 MED ORDER — TRETINOIN 0.025 % EX CREA: TOPICAL_CREAM | Freq: Every day | CUTANEOUS | 3 refills | Status: AC

## 2021-01-26 NOTE — Patient Instructions (Signed)
Tretinoin-face start 3 times a week protipic- lips

## 2021-02-05 ENCOUNTER — Encounter: Payer: Self-pay | Admitting: Family

## 2021-02-05 DIAGNOSIS — N9089 Other specified noninflammatory disorders of vulva and perineum: Secondary | ICD-10-CM | POA: Diagnosis not present

## 2021-02-05 DIAGNOSIS — R69 Illness, unspecified: Secondary | ICD-10-CM | POA: Diagnosis not present

## 2021-02-05 DIAGNOSIS — L723 Sebaceous cyst: Secondary | ICD-10-CM | POA: Diagnosis not present

## 2021-02-07 ENCOUNTER — Encounter: Payer: Self-pay | Admitting: Dermatology

## 2021-02-07 NOTE — Progress Notes (Signed)
   Follow-Up Visit   Subjective  Ruth Hopkins is a 55 y.o. female who presents for the following: Skin Problem (Here to have a few spots checked. Patient has some spots on face that look like acne but doesn't go away. Using otc acne cream. Red[place] Baseman place on chest. Also has a cyst ob buttock she wants looked at. Patient also has "rosacea" on lips uses protopic but needs new prescription. ).  Multiple skin issues Location:  Duration:  Quality:  Associated Signs/Symptoms: Modifying Factors:  Severity:  Timing: Context:   Objective  Well appearing patient in no apparent distress; mood and affect are within normal limits. Objective  Left Upper Back: Smooth red 2 mm papules  Objective  Mid Back: Full body skin exam.  No atypical pigmented lesions or nonmelanoma skin cancer  Objective  Right Hip (side) - Posterior: Firm 5 mm pink dermal papule; dermoscopy typical  Objective  Left Forearm - Posterior, Right Upper Back: Flat subtly textured tan 5 mm papules  Objective  Right Upper Cutaneous Lip: Half millimeter hard epidermal white papule  Objective  Head - Anterior (Face): Patient states she still gets some acne-like breakouts  Objective  Mid Upper Vermilion Lip: Subtle dermatitis perhaps mixed with ultraviolet damage.    A full examination was performed including scalp, head, eyes, ears, nose, lips, neck, chest, axillae, abdomen, back, buttocks, bilateral upper extremities, bilateral lower extremities, hands, feet, fingers, toes, fingernails, and toenails. All findings within normal limits unless otherwise noted below.  Areas beneath undergarments not fully examined   Assessment & Plan    Hemangioma of skin Left Upper Back  Intervention not necessary  Screening for malignant neoplasm of skin Mid Back  Annual skin examination.  Patient encouraged to self examine twice annually.  Continued ultraviolet protection.  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  Ordered Medications: tretinoin (RETIN-A) 0.025 % cream  Eczema, unspecified type Mid Upper Vermilion Lip  Generic Protopic ointment nightly for 3 weeks.  Ordered Medications: tacrolimus (PROTOPIC) 0.1 % ointment      I, Lavonna Monarch, MD, have reviewed all documentation for this visit.  The documentation on 02/07/21 for the exam, diagnosis, procedures, and orders are all accurate and complete.

## 2021-02-12 ENCOUNTER — Telehealth: Payer: Self-pay | Admitting: Dermatology

## 2021-02-12 NOTE — Telephone Encounter (Signed)
Patient called to say that the prescriptions for Tretinoin and Tacrolimus were over $100.00 each so she cannot afford them.  What can she do?

## 2021-02-17 ENCOUNTER — Ambulatory Visit: Payer: Medicare HMO

## 2021-02-17 NOTE — Telephone Encounter (Signed)
Left message for patient to return our phone call.

## 2021-02-17 NOTE — Telephone Encounter (Signed)
I believe Ruth Hopkins had been using Protopic on this area before her visit with me.  Clinically this was very very nonspecific presentation with some inflammation and some sun damage, so I simply told her she could continue the Protopic if it was helping.  Missing is a definitive diagnosis.  I would first try generic Hytone ointment 2.5% nightly for 4 weeks.  If this is also of no benefit, I would rather take a second look then to keep guessing at an appropriate therapy.

## 2021-02-17 NOTE — Telephone Encounter (Signed)
Patient aware of cash pay and goodrx

## 2021-02-18 ENCOUNTER — Ambulatory Visit (INDEPENDENT_AMBULATORY_CARE_PROVIDER_SITE_OTHER): Payer: Medicare HMO

## 2021-02-18 VITALS — Ht 64.0 in | Wt 127.0 lb

## 2021-02-18 DIAGNOSIS — Z0000 Encounter for general adult medical examination without abnormal findings: Secondary | ICD-10-CM | POA: Diagnosis not present

## 2021-02-18 MED ORDER — HYDROCORTISONE 2.5 % EX OINT: TOPICAL_OINTMENT | Freq: Two times a day (BID) | CUTANEOUS | 0 refills | Status: DC

## 2021-02-18 NOTE — Addendum Note (Signed)
Addended by: Sheran Lawless on: 02/18/2021 10:49 AM   Modules accepted: Orders

## 2021-02-18 NOTE — Patient Instructions (Addendum)
Ruth Hopkins , Thank you for taking time to come for your Medicare Wellness Visit. I appreciate your ongoing commitment to your health goals. Please review the following plan we discussed and let me know if I can assist you in the future.   Screening recommendations/referrals: Colonoscopy: Done 01/10/2018 - Repeat in 10 years Mammogram: Done 02/05/21 - continue routine visits with Aundra Millet Bone Density: Due at age 55 Recommended yearly ophthalmology/optometry visit for glaucoma screening and checkup Recommended yearly dental visit for hygiene and checkup  Vaccinations: Influenza vaccine: Done 07/24/2020 - Repeat annually Pneumococcal vaccine: Due Tdap vaccine: Due (every 10 years) Shingles vaccine: Due Shingrix discussed. Please contact your pharmacy for coverage information.   Covid-19: Declined  Advanced directives: Advance directive discussed with you today. Even though you declined this today, please call our office should you change your mind, and we can give you the proper paperwork for you to fill out.  Conditions/risks identified: Aim for 30 minutes of exercise or brisk walking each day, drink 6-8 glasses of water and eat lots of fruits and vegetables. If you wish to quit smoking, help is available. For free tobacco cessation program offerings call the [facility] at 701 506 7890 or Live Well Line at (629)700-1786. You may also visit www..com or email livelifewell_0 .com for more information on other programs.   You may also call 1-800-QUIT-NOW 806 055 5996) or visit www.VirusCrisis.dk or www.BecomeAnEx.org for additional resources on smoking cessation.   Next appointment: Follow up in one year for your annual wellness visit.   Preventive Care 40-64 Years, Female Preventive care refers to lifestyle choices and visits with your health care provider that can promote health and wellness. What does preventive care include?  A yearly physical exam.  This is also called an annual well check.  Dental exams once or twice a year.  Routine eye exams. Ask your health care provider how often you should have your eyes checked.  Personal lifestyle choices, including:  Daily care of your teeth and gums.  Regular physical activity.  Eating a healthy diet.  Avoiding tobacco and drug use.  Limiting alcohol use.  Practicing safe sex.  Taking low-dose aspirin daily starting at age 69.  Taking vitamin and mineral supplements as recommended by your health care provider. What happens during an annual well check? The services and screenings done by your health care provider during your annual well check will depend on your age, overall health, lifestyle risk factors, and family history of disease. Counseling  Your health care provider may ask you questions about your:  Alcohol use.  Tobacco use.  Drug use.  Emotional well-being.  Home and relationship well-being.  Sexual activity.  Eating habits.  Work and work Statistician.  Method of birth control.  Menstrual cycle.  Pregnancy history. Screening  You may have the following tests or measurements:  Height, weight, and BMI.  Blood pressure.  Lipid and cholesterol levels. These may be checked every 5 years, or more frequently if you are over 84 years old.  Skin check.  Lung cancer screening. You may have this screening every year starting at age 69 if you have a 30-pack-year history of smoking and currently smoke or have quit within the past 15 years.  Fecal occult blood test (FOBT) of the stool. You may have this test every year starting at age 38.  Flexible sigmoidoscopy or colonoscopy. You may have a sigmoidoscopy every 5 years or a colonoscopy every 10 years starting at age 25.  Hepatitis C blood test.  Hepatitis B blood test.  Sexually transmitted disease (STD) testing.  Diabetes screening. This is done by checking your blood sugar (glucose) after you have  not eaten for a while (fasting). You may have this done every 1-3 years.  Mammogram. This may be done every 1-2 years. Talk to your health care provider about when you should start having regular mammograms. This may depend on whether you have a family history of breast cancer.  BRCA-related cancer screening. This may be done if you have a family history of breast, ovarian, tubal, or peritoneal cancers.  Pelvic exam and Pap test. This may be done every 3 years starting at age 43. Starting at age 45, this may be done every 5 years if you have a Pap test in combination with an HPV test.  Bone density scan. This is done to screen for osteoporosis. You may have this scan if you are at high risk for osteoporosis. Discuss your test results, treatment options, and if necessary, the need for more tests with your health care provider. Vaccines  Your health care provider may recommend certain vaccines, such as:  Influenza vaccine. This is recommended every year.  Tetanus, diphtheria, and acellular pertussis (Tdap, Td) vaccine. You may need a Td booster every 10 years.  Zoster vaccine. You may need this after age 27.  Pneumococcal 13-valent conjugate (PCV13) vaccine. You may need this if you have certain conditions and were not previously vaccinated.  Pneumococcal polysaccharide (PPSV23) vaccine. You may need one or two doses if you smoke cigarettes or if you have certain conditions. Talk to your health care provider about which screenings and vaccines you need and how often you need them. This information is not intended to replace advice given to you by your health care provider. Make sure you discuss any questions you have with your health care provider. Document Released: 10/03/2015 Document Revised: 05/26/2016 Document Reviewed: 07/08/2015 Elsevier Interactive Patient Education  2017 [place] Prevention in the Home Falls can cause injuries. They can happen to people of all ages.  There are many things you can do to make your home safe and to help prevent falls. What can I do on the outside of my home?  Regularly fix the edges of walkways and driveways and fix any cracks.  Remove anything that might make you trip as you walk through a door, such as a raised step or threshold.  Trim any bushes or trees on the path to your home.  Use bright outdoor lighting.  Clear any walking paths of anything that might make someone trip, such as rocks or tools.  Regularly check to see if handrails are loose or broken. Make sure that both sides of any steps have handrails.  Any raised decks and porches should have guardrails on the edges.  Have any leaves, snow, or ice cleared regularly.  Use sand or salt on walking paths during winter.  Clean up any spills in your garage right away. This includes oil or grease spills. What can I do in the bathroom?  Use night lights.  Install grab bars by the toilet and in the tub and shower. Do not use towel bars as grab bars.  Use non-skid mats or decals in the tub or shower.  If you need to sit down in the shower, use a plastic, non-slip stool.  Keep the floor dry. Clean up any water that spills on the floor as soon as it  happens.  Remove soap buildup in the tub or shower regularly.  Attach bath mats securely with double-sided non-slip rug tape.  Do not have throw rugs and other things on the floor that can make you trip. What can I do in the bedroom?  Use night lights.  Make sure that you have a light by your bed that is easy to reach.  Do not use any sheets or blankets that are too big for your bed. They should not hang down onto the floor.  Have a firm chair that has side arms. You can use this for support while you get dressed.  Do not have throw rugs and other things on the floor that can make you trip. What can I do in the kitchen?  Clean up any spills right away.  Avoid walking on wet floors.  Keep items that  you use a lot in easy-to-reach places.  If you need to reach something above you, use a strong step stool that has a grab bar.  Keep electrical cords out of the way.  Do not use floor polish or wax that makes floors slippery. If you must use wax, use non-skid floor wax.  Do not have throw rugs and other things on the floor that can make you trip. What can I do with my stairs?  Do not leave any items on the stairs.  Make sure that there are handrails on both sides of the stairs and use them. Fix handrails that are broken or loose. Make sure that handrails are as long as the stairways.  Check any carpeting to make sure that it is firmly attached to the stairs. Fix any carpet that is loose or worn.  Avoid having throw rugs at the top or bottom of the stairs. If you do have throw rugs, attach them to the floor with carpet tape.  Make sure that you have a light switch at the top of the stairs and the bottom of the stairs. If you do not have them, ask someone to add them for you. What else can I do to help prevent falls?  Wear shoes that:  Do not have high heels.  Have rubber bottoms.  Are comfortable and fit you well.  Are closed at the toe. Do not wear sandals.  If you use a stepladder:  Make sure that it is fully opened. Do not climb a closed stepladder.  Make sure that both sides of the stepladder are locked into place.  Ask someone to hold it for you, if possible.  Clearly mark and make sure that you can see:  Any grab bars or handrails.  First and last steps.  Where the edge of each step is.  Use tools that help you move around (mobility aids) if they are needed. These include:  Canes.  Walkers.  Scooters.  Crutches.  Turn on the lights when you go into a dark area. Replace any light bulbs as soon as they burn out.  Set up your furniture so you have a clear path. Avoid moving your furniture around.  If any of your floors are uneven, fix them.  If there  are any pets around you, be aware of where they are.  Review your medicines with your doctor. Some medicines can make you feel dizzy. This can increase your chance of falling. Ask your doctor what other things that you can do to help prevent falls. This information is not intended to replace advice given to you by  your health care provider. Make sure you discuss any questions you have with your health care provider. Document Released: 07/03/2009 Document Revised: 02/12/2016 Document Reviewed: 10/11/2014 Elsevier Interactive Patient Education  2017 [employer].

## 2021-02-18 NOTE — Progress Notes (Signed)
Subjective:   Ruth Hopkins is a 55 y.o. female who presents for an Initial Medicare Annual Wellness Visit.  Virtual Visit via Telephone Note  I connected with  Ruth Hopkins on 02/18/21 at 11:15 AM EDT by telephone and verified that I am speaking with the correct person using two identifiers.  Location: Patient: Home Provider: [facility] Persons participating in the virtual visit: patient/Nurse Health Advisor   I discussed the limitations, risks, security and privacy concerns of performing an evaluation and management service by telephone and the availability of in person appointments. The patient expressed understanding and agreed to proceed.  Interactive audio and video telecommunications were attempted between this nurse and patient, however failed, due to patient having technical difficulties OR patient did not have access to video capability.  We continued and completed visit with audio only.  Some vital signs may be absent or patient reported.   Ruth Goldsmith E Rahsaan Weakland, LPN   Review of Systems     Cardiac Risk Factors include: sedentary lifestyle;dyslipidemia;smoking/ tobacco exposure     Objective:    Today's Vitals   02/18/21 1127  Weight: 127 lb (57.6 kg)  Height: $Remove'5\' 4"'[id number]$  (1.626 m)   Body mass index is 21.8 kg/m.  Advanced Directives 02/18/2021  Does Patient Have a Medical Advance Directive? No  Would patient like information on creating a medical advance directive? No - Patient declined    Current Medications (verified) Outpatient Encounter Medications as of 02/18/2021  Medication Sig  . acetaminophen (TYLENOL) 500 MG tablet Take by mouth.  . clonazePAM (KLONOPIN) 0.5 MG tablet Take 0.5 mg by mouth 2 (two) times daily.   . cycloSPORINE (RESTASIS) 0.05 % ophthalmic emulsion Restasis 0.05 % eye drops in a dropperette  USE 1 DROP INTO BOTH EYES TWICE A DAY  . diclofenac (VOLTAREN) 75 MG EC tablet Take 1 tablet (75 mg total) by mouth 2 (two) times daily.  Marland Kitchen eletriptan (RELPAX) 40  MG tablet May repeat in 2 hours if headache persists or recurs.  Marland Kitchen estradiol (VIVELLE-DOT) 0.075 MG/24HR Place 1 patch onto the skin 2 (two) times a week.  . hydrocortisone 2.5 % ointment Apply topically 2 (two) times daily.  Marland Kitchen imipramine (TOFRANIL) 10 MG tablet Take 3 tablets (30 mg total) by mouth at bedtime.  Marland Kitchen levothyroxine (SYNTHROID) 50 MCG tablet TAKE 1 TABLET BY MOUTH ONCE DAILY BEFORE BREAKFAST  . methocarbamol (ROBAXIN) 500 MG tablet Take 1 tablet (500 mg total) by mouth every 6 (six) hours as needed for muscle spasms.  . mometasone (NASONEX) 50 MCG/ACT nasal spray Place 2 sprays into the nose daily.  . Multiple Vitamins-Minerals (MULTIVITAMIN PO) Take by mouth.  . Olopatadine HCl 0.2 % SOLN Apply 1 drop to eye in the morning and at bedtime.  Marland Kitchen omeprazole (PRILOSEC) 20 MG capsule Take 1 capsule (20 mg total) by mouth daily.  . ondansetron (ZOFRAN ODT) 4 MG disintegrating tablet Take 1 tablet (4 mg total) by mouth every 8 (eight) hours as needed for nausea or vomiting.  . tacrolimus (PROTOPIC) 0.1 % ointment Apply topically 2 (two) times daily.  Marland Kitchen tretinoin (RETIN-A) 0.025 % cream Apply topically at bedtime.   No facility-administered encounter medications on file as of 02/18/2021.    Allergies (verified) Latex, Codeine, Gabapentin, and Sulfa antibiotics   History: Past Medical History:  Diagnosis Date  . Chronic fatigue   . Depression   . Fibromyalgia   . IBS (irritable bowel syndrome)   . Migraine   . Thyroid disease  Past Surgical History:  Procedure Laterality Date  . BREAST SURGERY Right 2007   lumpectomy  . FOOT SURGERY     Family History  Problem Relation Age of Onset  . Asthma Mother   . Hypothyroidism Mother   . Hypertension Mother   . Raynaud syndrome Mother   . Hypertension Father    Social History   Socioeconomic History  . Marital status: Married    Spouse name: Not on file  . Number of children: Not on file  . Years of education: Not on file   . Highest education level: Not on file  Occupational History  . Not on file  Tobacco Use  . Smoking status: Light Tobacco Smoker    Types: Cigarettes  . Smokeless tobacco: Never Used  Vaping Use  . Vaping Use: Never used  Substance and Sexual Activity  . Alcohol use: No  . Drug use: No  . Sexual activity: Not on file  Other Topics Concern  . Not on file  Social History Narrative   Lives home with husband   Social Determinants of Health   Financial Resource Strain: Low Risk   . Difficulty of Paying Living Expenses: Not hard at all  Food Insecurity: No Food Insecurity  . Worried About Charity fundraiser in the Last Year: Never true  . Ran Out of Food in the Last Year: Never true  Transportation Needs: No Transportation Needs  . Lack of Transportation (Medical): No  . Lack of Transportation (Non-Medical): No  Physical Activity: Insufficiently Active  . Days of Exercise per Week: 3 days  . Minutes of Exercise per Session: 40 min  Stress: No Stress Concern Present  . Feeling of Stress : Not at all  Social Connections: Moderately Integrated  . Frequency of Communication with Friends and Family: More than three times a week  . Frequency of Social Gatherings with Friends and Family: Twice a week  . Attends Religious Services: More than 4 times per year  . Active Member of Clubs or Organizations: No  . Attends Archivist Meetings: Never  . Marital Status: Married    Tobacco Counseling Ready to quit: Not Answered Counseling given: Not Answered   Clinical Intake:  Pre-visit preparation completed: Yes  Pain : No/denies pain     BMI - recorded: 21.8 Nutritional Status: BMI of 19-24  Normal Nutritional Risks: None Diabetes: No  How often do you need to have someone help you when you read instructions, pamphlets, or other written materials from your doctor or pharmacy?: 1 - Never  Diabetic? No  Interpreter Needed?: No  Information entered by :: Marixa Mellott, LPN   Activities of Daily Living In your present state of health, do you have any difficulty performing the following activities: 02/18/2021  Hearing? N  Vision? N  Difficulty concentrating or making decisions? N  Walking or climbing stairs? N  Dressing or bathing? N  Doing errands, shopping? N  Preparing Food and eating ? N  Using the Toilet? N  In the past six months, have you accidently leaked urine? N  Do you have problems with loss of bowel control? N  Managing your Medications? N  Managing your Finances? N  Housekeeping or managing your Housekeeping? N  Some recent data might be hidden    Patient Care Team: Sharion Balloon, FNP as PCP - General (Family Medicine) Molli Posey, MD as Consulting Physician (Obstetrics and Gynecology) Lavonna Monarch, MD as Consulting Physician (Dermatology) Suella Broad,  MD as Consulting Physician (Physical Medicine and Rehabilitation) Gean Birchwood (Hematology and Oncology) Noemi Chapel, NP as Nurse Practitioner (Psychology) Paulla Dolly Tamala Fothergill, DPM as Consulting Physician (Podiatry)  Indicate any recent Medical Services you may have received from other than [facility] providers in the past year (date may be approximate).     Assessment:   This is a routine wellness examination for Ruth Hopkins.  Hearing/Vision screen  Hearing Screening   '125Hz'$  $Remo'250Hz'pMFpH$'500Hz'$'1000Hz'$'2000Hz'$'3000Hz'$'4000Hz'$'6000Hz'$'8000Hz'$   Right ear:           Left ear:           Comments: Denies hearing difficulties   Vision Screening Comments: Up to date with routine eye exams at [facility] in [place] - denies vision difficulties  Dietary issues and exercise activities discussed: Current Exercise Habits: Home exercise routine, Type of exercise: walking;stretching, Time (Minutes): 40, Frequency (Times/Week): 3, Weekly Exercise (Minutes/Week): 120, Intensity: Mild, Exercise limited by: orthopedic condition(s)  Goals Addressed            This Visit's Progress   . Quit  Smoking       Down to 1/2 ppd      Depression Screen PHQ 2/9 Scores 02/18/2021 07/24/2020 03/11/2020 02/08/2020 07/31/2019 01/23/2019 11/07/2018  PHQ - 2 Score 1 1 0 $R'1 1 4 3  'nJ$ PHQ- 9 Score - 4 - $R'2 5 9 7    'lh$ Fall Risk Fall Risk  02/18/2021 01/21/2021 07/24/2020 03/11/2020 02/08/2020  Falls in the past year? 0 0 0 0 0  Number falls in past yr: 0 - - - -  Injury with Fall? 0 - - - -  Risk for fall due to : Orthopedic patient;Medication side effect - - - -  Follow up Falls prevention discussed - - - -    FALL RISK PREVENTION PERTAINING TO THE HOME:  Any stairs in or around the home? Yes  If so, are there any without handrails? No  Home free of loose throw rugs in walkways, pet beds, electrical cords, etc? Yes  Adequate lighting in your home to reduce risk of falls? Yes   ASSISTIVE DEVICES UTILIZED TO PREVENT FALLS:  Life alert? No  Use of a cane, walker or w/c? No  Grab bars in the bathroom? Yes  Shower chair or bench in shower? Yes  Elevated toilet seat or a handicapped toilet? No   TIMED UP AND GO:  Was the test performed? No . Telephonic visit.  Cognitive Function: Normal cognitive status assessed by direct observation by this Nurse Health Advisor. No abnormalities found.         Immunizations Immunization History  Administered Date(s) Administered  . Influenza Inj Mdck Quad With Preservative 06/26/2019  . Influenza,inj,Quad PF,6+ Mos 08/05/2018, 07/24/2020  . MMR 07/07/2000  . Tdap 04/14/2000    TDAP status: Due, Education has been provided regarding the importance of this vaccine. Advised may receive this vaccine at local pharmacy or Health Dept. Aware to provide a copy of the vaccination record if obtained from local pharmacy or Health Dept. Verbalized acceptance and understanding.  Flu Vaccine status: Up to date  Pneumococcal vaccine status: Due, Education has been provided regarding the importance of this vaccine. Advised may receive this vaccine at local pharmacy or Health  Dept. Aware to provide a copy of the vaccination record if obtained from local pharmacy or Health Dept. Verbalized acceptance and understanding.  Covid-19 vaccine status: Declined, Education has been provided regarding the importance of this vaccine but  patient still declined. Advised may receive this vaccine at local pharmacy or Health Dept.or vaccine clinic. Aware to provide a copy of the vaccination record if obtained from local pharmacy or Health Dept. Verbalized acceptance and understanding.  Qualifies for Shingles Vaccine? Yes   Zostavax completed No   Shingrix Completed?: No.    Education has been provided regarding the importance of this vaccine. Patient has been advised to call insurance company to determine out of pocket expense if they have not yet received this vaccine. Advised may also receive vaccine at local pharmacy or Health Dept. Verbalized acceptance and understanding.  Screening Tests Health Maintenance  Topic Date Due  . COVID-19 Vaccine (1) Never done  . TETANUS/TDAP  04/14/2010  . Zoster Vaccines- Shingrix (1 of 2) Never done  . INFLUENZA VACCINE  04/20/2021  . PAP SMEAR-Modifier  06/15/2021  . MAMMOGRAM  10/30/2022  . COLONOSCOPY (Pts 45-66yrs Insurance coverage will need to be confirmed)  01/11/2028  . Hepatitis C Screening  Completed  . HIV Screening  Completed  . HPV VACCINES  Aged Out    Health Maintenance  Health Maintenance Due  Topic Date Due  . COVID-19 Vaccine (1) Never done  . TETANUS/TDAP  04/14/2010  . Zoster Vaccines- Shingrix (1 of 2) Never done    Colorectal cancer screening: Type of screening: Colonoscopy. Completed 01/10/2018. Repeat every 10 years  Mammogram status: Completed 02/05/21. Repeat every year  Bone Density status: Due at age 45  Lung Cancer Screening: (Low Dose CT Chest recommended if Age 63-80 years, 30 pack-year currently smoking OR have quit w/in 15years.) does not qualify.   Additional Screening:  Hepatitis C Screening:  does not qualify; Completed 01/21/21  Vision Screening: Recommended annual ophthalmology exams for early detection of glaucoma and other disorders of the eye. Is the patient up to date with their annual eye exam?  Yes  Who is the provider or what is the name of the office in which the patient attends annual eye exams? [facility] If pt is not established with a provider, would they like to be referred to a provider to establish care? No .   Dental Screening: Recommended annual dental exams for proper oral hygiene  Community Resource Referral / Chronic Care Management: CRR required this visit?  No   CCM required this visit?  No      Plan:     I have personally reviewed and noted the following in the patient's chart:   . Medical and social history . Use of alcohol, tobacco or illicit drugs  . Current medications and supplements including opioid prescriptions. Patient is not currently taking opioid prescriptions. . Functional ability and status . Nutritional status . Physical activity . Advanced directives . List of other physicians . Hospitalizations, surgeries, and ER visits in previous 12 months . Vitals . Screenings to include cognitive, depression, and falls . Referrals and appointments  In addition, I have reviewed and discussed with patient certain preventive protocols, quality metrics, and best practice recommendations. A written personalized care plan for preventive services as well as general preventive health recommendations were provided to patient.     Sandrea Hammond, LPN   03/25/2830   Nurse Notes: None

## 2021-02-18 NOTE — Telephone Encounter (Signed)
Left message new rx sent per Dr Denna Haggard note

## 2021-02-23 ENCOUNTER — Encounter: Payer: Self-pay | Admitting: Nurse Practitioner

## 2021-02-23 ENCOUNTER — Ambulatory Visit (INDEPENDENT_AMBULATORY_CARE_PROVIDER_SITE_OTHER): Payer: Medicare HMO | Admitting: Nurse Practitioner

## 2021-02-23 DIAGNOSIS — J069 Acute upper respiratory infection, unspecified: Secondary | ICD-10-CM | POA: Diagnosis not present

## 2021-02-23 MED ORDER — BENZONATATE 100 MG PO CAPS: 100.0000 mg | ORAL_CAPSULE | Freq: Three times a day (TID) | ORAL | 0 refills | Status: DC | PRN

## 2021-02-23 MED ORDER — PREDNISONE 10 MG (21) PO TBPK
ORAL_TABLET | ORAL | 0 refills | Status: DC
Start: 2021-02-23 — End: 2021-04-17

## 2021-02-23 NOTE — Assessment & Plan Note (Signed)
Patient is reporting worsening symptoms of congestion, cough, headache with no fever, nausea body aches or sinus pain.  Symptoms have been ongoing for the last 3 days.  Patient is to current home COVID test that is negative.  Patient reports she has been smoking more lately and has been in the pool and may have worsened her symptoms. Advised patient to take Tylenol for headache, prednisone taper, benzonatate for cough, increase hydration, COVID-19 PCR in clinic.  Follow-up with worsening unresolved symptoms.

## 2021-02-23 NOTE — Progress Notes (Addendum)
     Virtual Visit  Note Due to COVID-19 pandemic this visit was conducted virtually. This visit type was conducted due to national recommendations for restrictions regarding the COVID-19 Pandemic (e.g. social distancing, sheltering in place) in an effort to limit this patient's exposure and mitigate transmission in our community. All issues noted in this document were discussed and addressed.  A physical exam was not performed with this format.  I connected with Ruth Hopkins on 02/23/21 at  08 6 AM by telephone and verified that I am speaking with the correct person using two identifiers. Ruth Hopkins is currently located at home during visit. The provider, Ivy Lynn, NP is located in their office at time of visit.  I discussed the limitations, risks, security and privacy concerns of performing an evaluation and management service by telephone and the availability of in person appointments. I also discussed with the patient that there may be a patient responsible charge related to this service. The patient expressed understanding and agreed to proceed.   History and Present Illness:  URI  This is a new problem. The problem has been unchanged. There has been no fever. Associated symptoms include congestion and coughing. Pertinent negatives include no rash, sinus pain or sore throat. She has tried decongestant for the symptoms. The treatment provided mild relief.      Review of Systems  Constitutional: Negative for chills, fever and malaise/fatigue.  HENT: Positive for congestion. Negative for sinus pain and sore throat.   Eyes: Negative.   Respiratory: Positive for cough.   Cardiovascular: Negative.   Skin: Negative for rash.  All other systems reviewed and are negative.    Observations/Objective: Televisit patient did not sound to be in distress.  Assessment and Plan:  Viral upper respiratory tract infection Patient is reporting worsening symptoms of congestion, cough,  headache with no fever, nausea body aches or sinus pain.  Symptoms have been ongoing for the last 3 days.  Patient is to current home COVID test that is negative.  Patient reports she has been smoking more lately and has been in the pool and may have worsened her symptoms. Advised patient to take Tylenol for headache, prednisone taper, benzonatate for cough, increase hydration, COVID-19 PCR in clinic.   Chronic Follow Up Instructions: Follow-up with worsening unresolved symptoms.    I discussed the assessment and treatment plan with the patient. The patient was provided an opportunity to ask questions and all were answered. The patient agreed with the plan and demonstrated an understanding of the instructions.   The patient was advised to call back or seek an in-person evaluation if the symptoms worsen or if the condition fails to improve as anticipated.  The above assessment and management plan was discussed with the patient. The patient verbalized understanding of and has agreed to the management plan. Patient is aware to call the clinic if symptoms persist or worsen. Patient is aware when to return to the clinic for a follow-up visit. Patient educated on when it is appropriate to go to the emergency department.   Time call ended: 8:12 AM  I provided 6 minutes of  non face-to-face time during this encounter.    Ivy Lynn, NP

## 2021-02-24 ENCOUNTER — Ambulatory Visit (INDEPENDENT_AMBULATORY_CARE_PROVIDER_SITE_OTHER): Payer: Medicare HMO | Admitting: Nurse Practitioner

## 2021-02-24 ENCOUNTER — Telehealth: Payer: Self-pay | Admitting: Family

## 2021-02-24 ENCOUNTER — Encounter: Payer: Self-pay | Admitting: Nurse Practitioner

## 2021-02-24 DIAGNOSIS — J0110 Acute frontal sinusitis, unspecified: Secondary | ICD-10-CM | POA: Diagnosis not present

## 2021-02-24 LAB — NOVEL CORONAVIRUS, NAA: SARS-CoV-2, NAA: NOT DETECTED

## 2021-02-24 LAB — SARS-COV-2, NAA 2 DAY TAT

## 2021-02-24 MED ORDER — AMOXICILLIN-POT CLAVULANATE 875-125 MG PO TABS: 1.0000 | ORAL_TABLET | Freq: Two times a day (BID) | ORAL | 0 refills | Status: DC

## 2021-02-24 NOTE — Assessment & Plan Note (Signed)
Worsening signs and symptoms of sinusitis.  Patient is reporting right ear pain, sore throat, cough, chills, body ache with nausea since Friday.  Nothing over-the-counter has alleviate symptoms.  Patient also completed prednisone taper with no relief.  I started patient on Augmentin with clavulanic acid 875-125 mg tablet twice daily for 14 days. I provided education to patient.  To please take medication as prescribed increase hydration and follow-up with worsening or unresolved symptoms.  Rx sent to pharmacy.

## 2021-02-24 NOTE — Progress Notes (Signed)
   Virtual Visit  Note Due to COVID-19 pandemic this visit was conducted virtually. This visit type was conducted due to national recommendations for restrictions regarding the COVID-19 Pandemic (e.g. social distancing, sheltering in place) in an effort to limit this patient's exposure and mitigate transmission in our community. All issues noted in this document were discussed and addressed.  A physical exam was not performed with this format.  I connected with Ruth Hopkins on 02/24/21 at  11:30 AM by telephone and verified that I am speaking with the correct person using two identifiers. Ruth Hopkins is currently located at home during visit. The provider, Ivy Lynn, NP is located in their office at time of visit.  I discussed the limitations, risks, security and privacy concerns of performing an evaluation and management service by telephone and the availability of in person appointments. I also discussed with the patient that there may be a patient responsible charge related to this service. The patient expressed understanding and agreed to proceed.   History and Present Illness:  Sinusitis This is a new problem. Episode onset: In the past 4 days. The problem has been gradually worsening since onset. There has been no fever. The pain is moderate. Associated symptoms include chills, congestion, coughing, ear pain, headaches, sinus pressure and a sore throat. Treatments tried: Steroids. The treatment provided no relief.      Review of Systems  Constitutional: Positive for chills.  HENT: Positive for congestion, ear pain, sinus pressure and sore throat.   Respiratory: Positive for cough.   Neurological: Positive for headaches.  All other systems reviewed and are negative.    Observations/Objective: Televisit patient did not sound to be in distress.  Assessment and Plan: Subacute frontal sinusitis Worsening signs and symptoms of sinusitis.  Patient is reporting right ear pain,  sore throat, cough, chills, body ache with nausea since Friday.  Nothing over-the-counter has alleviate symptoms.  Patient also completed prednisone taper with no relief.  I started patient on Augmentin with clavulanic acid 875-125 mg tablet twice daily for 14 days. I provided education to patient.  To please take medication as prescribed increase hydration and follow-up with worsening or unresolved symptoms.  Rx sent to pharmacy.  Follow Up Instructions: Follow-up with unresolved symptoms.    I discussed the assessment and treatment plan with the patient. The patient was provided an opportunity to ask questions and all were answered. The patient agreed with the plan and demonstrated an understanding of the instructions.   The patient was advised to call back or seek an in-person evaluation if the symptoms worsen or if the condition fails to improve as anticipated.  The above assessment and management plan was discussed with the patient. The patient verbalized understanding of and has agreed to the management plan. Patient is aware to call the clinic if symptoms persist or worsen. Patient is aware when to return to the clinic for a follow-up visit. Patient educated on when it is appropriate to go to the emergency department.   Time call ended: 11:40 AM  I provided 10 minutes of  non face-to-face time during this encounter.    Ivy Lynn, NP

## 2021-02-24 NOTE — Telephone Encounter (Signed)
Patient had a visit with JE yesterday  Requesting abx be sent in  Please advise

## 2021-03-03 ENCOUNTER — Telehealth: Payer: Self-pay | Admitting: Family

## 2021-03-03 DIAGNOSIS — G43901 Migraine, unspecified, not intractable, with status migrainosus: Secondary | ICD-10-CM

## 2021-03-03 MED ORDER — ELETRIPTAN HYDROBROMIDE 40 MG PO TABS: ORAL_TABLET | ORAL | 1 refills | Status: DC

## 2021-03-03 NOTE — Telephone Encounter (Signed)
Aware we sent #[id number] in May and  re-send again today

## 2021-03-10 ENCOUNTER — Other Ambulatory Visit: Payer: Self-pay | Admitting: Family

## 2021-03-10 DIAGNOSIS — E039 Hypothyroidism, unspecified: Secondary | ICD-10-CM

## 2021-03-11 ENCOUNTER — Telehealth: Payer: Self-pay | Admitting: Family

## 2021-03-11 NOTE — Telephone Encounter (Signed)
I spoke with pt and advised a lot of the mail in pharmacies will only do 90 day supply medications and this medication is for PRN and may need to go to local pharmacy and pt states she will call the pharmacy to verify.

## 2021-03-11 NOTE — Telephone Encounter (Signed)
Pt is having problems with refills on eletriptan (RELPAX) 40 MG tablet. The directions needs clarification given to [facility] home delivery mail order.   Pt is prescribed 12 tablets for a month.

## 2021-03-12 ENCOUNTER — Encounter: Payer: Self-pay | Admitting: Physician Assistant

## 2021-03-12 ENCOUNTER — Ambulatory Visit (INDEPENDENT_AMBULATORY_CARE_PROVIDER_SITE_OTHER): Payer: Medicare HMO | Admitting: Physician Assistant

## 2021-03-12 DIAGNOSIS — J069 Acute upper respiratory infection, unspecified: Secondary | ICD-10-CM

## 2021-03-12 NOTE — Patient Instructions (Signed)
Upper Respiratory Infection, Adult  An upper respiratory infection (URI) affects the nose, throat, and upper air passages. URIs are caused by germs (viruses). The most common type of URI is often called "the common cold."  Medicines cannot cure URIs, but you can do things at home to relieve your symptoms. URIs usually get better within 7-10 days.  Follow these instructions at home:  Activity  Rest as needed.  If you have a fever, stay home from work or school until your fever is gone, or until your doctor says you may return to work or school.  You should stay home until you cannot spread the infection anymore (you are not contagious).  Your doctor may have you wear a face mask so you have less risk of spreading the infection.  Relieving symptoms  Gargle with a salt-water mixture 3-4 times a day or as needed. To make a salt-water mixture, completely dissolve -1 tsp of salt in 1 cup of warm water.  Use a cool-mist humidifier to add moisture to the air. This can help you breathe more easily.  Eating and drinking    Drink enough fluid to keep your pee (urine) pale yellow.  Eat soups and other clear broths.  General instructions    Take over-the-counter and prescription medicines only as told by your doctor. These include cold medicines, fever reducers, and cough suppressants.  Do not use any products that contain nicotine or tobacco. These include cigarettes and e-cigarettes. If you need help quitting, ask your doctor.  Avoid being where people are smoking (avoid secondhand smoke).  Make sure you get regular shots and get the flu shot every year.  Keep all follow-up visits as told by your doctor. This is important.  How to avoid spreading infection to others    Wash your hands often with soap and water. If you do not have soap and water, use hand sanitizer.  Avoid touching your mouth, face, eyes, or nose.  Cough or sneeze into a tissue or your sleeve or elbow. Do not cough or sneeze into your hand or into the  air.  Contact a doctor if:  You are getting worse, not better.  You have any of these:  A fever.  Chills.  Brown or red mucus in your nose.  Yellow or brown fluid (discharge)coming from your nose.  Pain in your face, especially when you bend forward.  Swollen neck glands.  Pain with swallowing.  White areas in the back of your throat.  Get help right away if:  You have shortness of breath that gets worse.  You have very bad or constant:  Headache.  Ear pain.  Pain in your forehead, behind your eyes, and over your cheekbones (sinus pain).  Chest pain.  You have long-lasting (chronic) lung disease along with any of these:  Wheezing.  Long-lasting cough.  Coughing up blood.  A change in your usual mucus.  You have a stiff neck.  You have changes in your:  Vision.  Hearing.  Thinking.  Mood.  Summary  An upper respiratory infection (URI) is caused by a germ called a virus. The most common type of URI is often called "the common cold."  URIs usually get better within 7-10 days.  Take over-the-counter and prescription medicines only as told by your doctor.  This information is not intended to replace advice given to you by your health care provider. Make sure you discuss any questions you have with your health care provider.  Document 

## 2021-03-12 NOTE — Progress Notes (Signed)
   Virtual Visit  Note Due to COVID-19 pandemic this visit was conducted virtually. This visit type was conducted due to national recommendations for restrictions regarding the COVID-19 Pandemic (e.g. social distancing, sheltering in place) in an effort to limit this patient's exposure and mitigate transmission in our community. All issues noted in this document were discussed and addressed.  A physical exam was not performed with this format.  I connected with Ruth Hopkins on 03/12/21 by telephone and verified that I am speaking with the correct person using two identifiers. Ruth Hopkins is currently located at home and is currently with no one during visit. The provider, Thomasene Mohair, PA-C is located in their office at time of visit.  I discussed the limitations, risks, security and privacy concerns of performing an evaluation and management service by telephone and the availability of in person appointments. I also discussed with the patient that there may be a patient responsible charge related to this service. The patient expressed understanding and agreed to proceed.   History and Present Illness:  Pt with a several week hx of sinus congestion and pressure Some assoc rhintis and cough Prev telemed appt on 6/6 Had COVID testing neg here and neg at home x 2 Started on Augmentin and Pred Sx did not improve     Review of Systems  Constitutional: Negative.   HENT:  Positive for congestion and sinus pain. Negative for sore throat.   Respiratory:  Positive for cough. Negative for shortness of breath and wheezing.   Cardiovascular: Negative.     Observations/Objective: defer  Assessment and Plan: URI  Givne duration fo sx, neg testing, and failure of ATB I feel pt needs in person appt Pt agreed Will have nursing call and schedule an appt  Follow Up Instructions: As above Contnue OTC meds for sx until appt    I discussed the assessment and treatment plan with the patient.  The patient was provided an opportunity to ask questions and all were answered. The patient agreed with the plan and demonstrated an understanding of the instructions.   The patient was advised to call back or seek an in-person evaluation if the symptoms worsen or if the condition fails to improve as anticipated.  The above assessment and management plan was discussed with the patient. The patient verbalized understanding of and has agreed to the management plan. Patient is aware to call the clinic if symptoms persist or worsen. Patient is aware when to return to the clinic for a follow-up visit. Patient educated on when it is appropriate to go to the emergency department.   Time call ended:    I provided 11 minutes of  non face-to-face time during this encounter.    Thomasene Mohair, PA-C

## 2021-03-13 ENCOUNTER — Ambulatory Visit: Payer: Medicare HMO | Admitting: Family

## 2021-03-25 DIAGNOSIS — Z86000 Personal history of in-situ neoplasm of breast: Secondary | ICD-10-CM | POA: Diagnosis not present

## 2021-03-25 DIAGNOSIS — Z9189 Other specified personal risk factors, not elsewhere classified: Secondary | ICD-10-CM | POA: Diagnosis not present

## 2021-03-25 DIAGNOSIS — Z1239 Encounter for other screening for malignant neoplasm of breast: Secondary | ICD-10-CM | POA: Diagnosis not present

## 2021-03-25 DIAGNOSIS — R922 Inconclusive mammogram: Secondary | ICD-10-CM | POA: Diagnosis not present

## 2021-03-31 ENCOUNTER — Other Ambulatory Visit: Payer: Self-pay | Admitting: Family

## 2021-04-06 DIAGNOSIS — R69 Illness, unspecified: Secondary | ICD-10-CM | POA: Diagnosis not present

## 2021-04-17 ENCOUNTER — Other Ambulatory Visit: Payer: Self-pay

## 2021-04-17 ENCOUNTER — Ambulatory Visit (INDEPENDENT_AMBULATORY_CARE_PROVIDER_SITE_OTHER): Payer: Medicare HMO | Admitting: Nurse Practitioner

## 2021-04-17 ENCOUNTER — Ambulatory Visit: Payer: Medicare HMO | Admitting: Family Medicine

## 2021-04-17 ENCOUNTER — Encounter: Payer: Self-pay | Admitting: Nurse Practitioner

## 2021-04-17 VITALS — BP 98/63 | HR 84 | Temp 97.7°F | Ht 64.0 in | Wt 128.0 lb

## 2021-04-17 DIAGNOSIS — M542 Cervicalgia: Secondary | ICD-10-CM | POA: Diagnosis not present

## 2021-04-17 DIAGNOSIS — R300 Dysuria: Secondary | ICD-10-CM

## 2021-04-17 LAB — URINALYSIS, ROUTINE W REFLEX MICROSCOPIC
Bilirubin, UA: NEGATIVE
Glucose, UA: NEGATIVE
Ketones, UA: NEGATIVE
Leukocytes,UA: NEGATIVE
Nitrite, UA: NEGATIVE
Protein,UA: NEGATIVE
Specific Gravity, UA: 1.02 (ref 1.005–1.030)
Urobilinogen, Ur: 0.2 mg/dL (ref 0.2–1.0)
pH, UA: 5.5 (ref 5.0–7.5)

## 2021-04-17 LAB — MICROSCOPIC EXAMINATION
Bacteria, UA: NONE SEEN
WBC, UA: NONE SEEN /hpf (ref 0–5)

## 2021-04-17 MED ORDER — DICLOFENAC SODIUM 1 % EX GEL: 2.0000 g | Freq: Four times a day (QID) | CUTANEOUS | 1 refills | Status: DC

## 2021-04-17 NOTE — Assessment & Plan Note (Signed)
Ongoing neck pain caused by tension symptoms not well resolved.  Patient reports moderate pain.  Voltaren gel as needed to left mid/shoulder, warm compress, anti-inflammatories/Tylenol as needed for pain.  Continue Robaxin as prescribed.  Rx sent to pharmacy.  Patient knows to follow-up with worsening or unresolved symptoms.

## 2021-04-17 NOTE — Assessment & Plan Note (Addendum)
Patient reports UTI symptoms.  On assessment no symptoms present this visit urinalysis negative for leukocytes, nitrites, bacteria.  Patient reports she increased her coffee and that has made her use the bathroom more frequently and she may be thinking that it is a UTI. Encourage patient to increase hydration, AZO OTC,  maintain good hygiene wiping from front to back, watch and wait for worsening symptoms and follow-up as needed. Urine sent to culture  Patient verbalized understanding.  Advised to use anti-inflammatory/Tylenol for pain as needed.

## 2021-04-17 NOTE — Patient Instructions (Signed)
Cervical Sprain A cervical sprain is also called a neck sprain. It is a stretch or tear in one or more ligaments in the neck. Ligaments are tissues that connect bones to eachother. Neck sprains can be mild, bad, or very bad. A very bad sprain in the neck can cause the bones in the neck to be unstable. This can damage the spinal cord. It can also cause serious problems in the brain, spinal cord, and nerves (nervous system). Most neck sprains heal in 4-6 weeks. It can take more or less time depending on: What caused the injury. The amount of injury. What are the causes? Neck sprains may be caused by trauma, such as: An injury from an accident in a vehicle such as a car or boat. A fall. The head and neck being moved front to back or side to side all of a sudden (whiplash injury). Mild neck sprains may be caused by wear and tear over time. What increases the risk? The following factors may make you more likely to develop this condition: Taking part in activities that put you at high risk of hurting your neck. These include: Contact sports. Animator. Gymnastics. Diving. Taking risks when driving or riding in a vehicle such as a car or boat. Arthritis caused by wear and tear of the joints in the spine. The neck not being very strong or flexible. Having had a neck injury in the past. Poor posture. Spending a lot of time in certain positions that put stress on the neck. This may be from sitting at a computer for a long time. What are the signs or symptoms? Symptoms of this condition include: Your neck, shoulders, or upper back feeling: Painful or sore. Stiff. Tender. Swollen. Hot, or like it is burning. Sudden tightening of neck muscles (spasms). Not being able to move the neck very much. Headache. Feeling dizzy. Feeling like you may vomit, or vomiting. Having a hand or arm that: Feels weak. Loses feeling (feels numb). Tingles. You may get symptoms right away after injury, or you  may get them over a few days. In some cases, symptoms may go away with treatment and come back overtime. How is this treated? This condition is treated by: Resting your neck. Icing the part of your neck that is hurt. Doing exercises to restore movement and strength to your neck (physical therapy). If there is no swelling, you may use heat therapy 2-3 days after the injury took place. If your injury is very bad, treatment may also include: Keeping your neck in place for a length of time. This may be done using: A neck collar. This supports your chin and the back of your head. A cervical traction device. This is a sling that holds up your head. The sling removes weight and pressure from your neck. It may also help to relieve pain. Medicines that help with: Pain. Irritation and swelling (inflammation). Medicines that help to relax your muscles (muscle relaxants). Surgery. This is rare. Follow these instructions at home: Medicines  Take over-the-counter and prescription medicines only as told by your doctor. Ask your doctor if the medicine prescribed to you: Requires you to avoid driving or using heavy machinery. Can cause trouble pooping (constipation). You may need to take these actions to prevent or treat trouble pooping: Drink enough fluid to keep your pee (urine) pale yellow. Take over-the-counter or prescription medicines. Eat foods that are high in fiber. These include beans, whole grains, and fresh fruits and vegetables. Limit foods that  are high in fat and sugar. These include fried or sweet foods.  If you have a neck collar: Wear it as told by your doctor. Do not take it off unless told. Ask your doctor before adjusting your collar. If you have long hair, keep it outside of the collar. Ask your doctor if you may take off the collar for cleaning and bathing. If you may take off the collar: Follow instructions about how to take it off safely. Clean it by hand with mild soap and  water. Let it air-dry fully. If your collar has pads that you can take out: Take the pads out every 1-2 days. Wash them by hand with soap and water. Let the pads air-dry fully before you put them back in the collar. Tell your doctor if your skin under the collar has irritation or sores. Managing pain, stiffness, and swelling     Use a cervical traction device, if told by your doctor. If told, put ice on the affected area. To do this: Put ice in a plastic bag. Place a towel between your skin and the bag. Leave the ice on for 20 minutes, 2-3 times a day. If told, put heat on the affected area. Do this before exercise or as often as told by your doctor. Use the heat source that your doctor recommends, such as a moist heat pack or a heating pad. Place a towel between your skin and the heat source. Leave the heat on for 20-30 minutes. Take the heat off if your skin turns bright red. This is very important if you cannot feel pain, heat, or cold. You may have a greater risk of getting burned. Activity Do not drive while wearing a neck collar. If you do not have a neck collar, ask if it is safe to drive while your neck heals. Do not lift anything that is heavier than 10 lb (4.5 kg), or the limit that you are told, until your doctor tells you that it is safe. Rest as told by your doctor. Do exercises as told by your doctor or physical therapist. Return to your normal activities as told by your doctor. Avoid positions and activities that make you feel worse. Ask your doctor what activities are safe for you. General instructions Do not use any products that contain nicotine or tobacco, such as cigarettes, e-cigarettes, and chewing tobacco. These can delay healing. If you need help quitting, ask your doctor. Keep all follow-up visits as told by your doctor or physical therapist. This is important. How is this prevented? To prevent a neck sprain from happening again: Practice good posture. Adjust  your workstation to help you do this. Exercise regularly as told by your doctor or physical therapist. Avoid activities that are risky or may cause a neck sprain. Contact a doctor if: Your symptoms get worse. Your symptoms do not get better after 2 weeks of treatment. Your pain gets worse. Medicine does not help your pain. You have new symptoms that you cannot explain. Your neck collar gives you sores on your skin or bothers your skin. Get help right away if: You have very bad pain. You get any of the following in any part of your body: Loss of feeling. Tingling. Weakness. You cannot move a part of your body. You have neck pain and either of these: Very bad dizziness. A very bad headache. Summary A cervical sprain is also called a neck sprain. It is a stretch or tear in one or more ligaments  in the neck. Ligaments are tissues that connect bones. Neck sprains may be caused by trauma, such as an injury or a fall. You may get symptoms right away after injury, or you may get them over a few days. Neck sprains may be treated with rest, heat, ice, medicines, exercise, and surgery. This information is not intended to replace advice given to you by your health care provider. Make sure you discuss any questions you have with your healthcare provider. Document Revised: 05/16/2019 Document Reviewed: 05/16/2019 Elsevier Patient Education  [place]. Urinary Tract Infection, Adult A urinary tract infection (UTI) is an infection of any part of the urinary tract. The urinary tract includes: The kidneys. The ureters. The bladder. The urethra. These organs make, store, and get rid of pee (urine) in the body. What are the causes? This infection is caused by germs (bacteria) in your genital area. These germs grow and cause swelling (inflammation) of your urinary tract. What increases the risk? The following factors may make you more likely to develop this condition: Using a small, thin  tube (catheter) to drain pee. Not being able to control when you pee or poop (incontinence). Being female. If you are female, these things can increase the risk: Using these methods to prevent pregnancy: A medicine that kills sperm (spermicide). A device that blocks sperm (diaphragm). Having low levels of a female hormone (estrogen). Being pregnant. You are more likely to develop this condition if: You have genes that add to your risk. You are sexually active. You take antibiotic medicines. You have trouble peeing because of: A prostate that is bigger than normal, if you are female. A blockage in the part of your body that drains pee from the bladder. A kidney stone. A nerve condition that affects your bladder. Not getting enough to drink. Not peeing often enough. You have other conditions, such as: Diabetes. A weak disease-fighting system (immune system). Sickle cell disease. Gout. Injury of the spine. What are the signs or symptoms? Symptoms of this condition include: Needing to pee right away. Peeing small amounts often. Pain or burning when peeing. Blood in the pee. Pee that smells bad or not like normal. Trouble peeing. Pee that is cloudy. Fluid coming from the vagina, if you are female. Pain in the belly or lower back. Other symptoms include: Vomiting. Not feeling hungry. Feeling mixed up (confused). This may be the first symptom in older adults. Being tired and grouchy (irritable). A fever. Watery poop (diarrhea). How is this treated? Taking antibiotic medicine. Taking other medicines. Drinking enough water. In some cases, you may need to see a specialist. Follow these instructions at home:  Medicines Take over-the-counter and prescription medicines only as told by your doctor. If you were prescribed an antibiotic medicine, take it as told by your doctor. Do not stop taking it even if you start to feel better. General instructions Make sure you: Pee until  your bladder is empty. Do not hold pee for a long time. Empty your bladder after sex. Wipe from front to back after peeing or pooping if you are a female. Use each tissue one time when you wipe. Drink enough fluid to keep your pee pale yellow. Keep all follow-up visits. Contact a doctor if: You do not get better after 1-2 days. Your symptoms go away and then come back. Get help right away if: You have very bad back pain. You have very bad pain in your lower belly. You have a fever. You have chills.  You feeling like you will vomit or you vomit. Summary A urinary tract infection (UTI) is an infection of any part of the urinary tract. This condition is caused by germs in your genital area. There are many risk factors for a UTI. Treatment includes antibiotic medicines. Drink enough fluid to keep your pee pale yellow. This information is not intended to replace advice given to you by your health care provider. Make sure you discuss any questions you have with your healthcare provider. Document Revised: 04/18/2020 Document Reviewed: 04/18/2020 Elsevier Patient Education  [place].

## 2021-04-17 NOTE — Addendum Note (Signed)
Addended by: Ivy Lynn on: 04/17/2021 04:49 PM   Modules accepted: Orders

## 2021-04-17 NOTE — Progress Notes (Addendum)
Acute Office Visit  Subjective:    Patient ID: Ruth Hopkins, female    DOB: 1966/09/07, 55 y.o.   MRN: [id number]  Chief Complaint  Patient presents with   Dysuria    Dysuria  This is a new problem. The current episode started yesterday. The problem occurs intermittently. The problem has been rapidly improving. The patient is experiencing no pain. There has been no fever. There is No history of pyelonephritis. Associated symptoms include frequency. Pertinent negatives include no chills, discharge, flank pain, hesitancy, nausea or urgency. She has tried nothing for the symptoms.   Patient is in today for Pain  She reports recurrent right shoulder pain. was not an injury that may have caused the pain. The pain started a few weeks ago and is staying constant. The pain does not radiate . The pain is described as aching, is moderate in intensity, occurring intermittently. Symptoms are worse in the: morning, afternoon  Aggravating factors: none Relieving factors: none.  She has tried NSAIDs with little relief.   ---------------------------------------------------------------------------------------------------   Past Medical History:  Diagnosis Date   Chronic fatigue    Depression    Fibromyalgia    IBS (irritable bowel syndrome)    Migraine    Thyroid disease     Past Surgical History:  Procedure Laterality Date   BREAST SURGERY Right 2007   lumpectomy   FOOT SURGERY      Family History  Problem Relation Age of Onset   Asthma Mother    Hypothyroidism Mother    Hypertension Mother    Raynaud syndrome Mother    Hypertension Father     Social History   Socioeconomic History   Marital status: Married    Spouse name: Not on file   Number of children: Not on file   Years of education: Not on file   Highest education level: Not on file  Occupational History   Not on file  Tobacco Use   Smoking status: Light Smoker    Types: Cigarettes   Smokeless tobacco: Never   Vaping Use   Vaping Use: Never used  Substance and Sexual Activity   Alcohol use: No   Drug use: No   Sexual activity: Not on file  Other Topics Concern   Not on file  Social History Narrative   Lives home with husband   Social Determinants of Health   Financial Resource Strain: Low Risk    Difficulty of Paying Living Expenses: Not hard at all  Food Insecurity: No Food Insecurity   Worried About Programme researcher, broadcasting/film/video in the Last Year: Never true   Ran Out of Food in the Last Year: Never true  Transportation Needs: No Transportation Needs   Lack of Transportation (Medical): No   Lack of Transportation (Non-Medical): No  Physical Activity: Insufficiently Active   Days of Exercise per Week: 3 days   Minutes of Exercise per Session: 40 min  Stress: No Stress Concern Present   Feeling of Stress : Not at all  Social Connections: Moderately Integrated   Frequency of Communication with Friends and Family: More than three times a week   Frequency of Social Gatherings with Friends and Family: Twice a week   Attends Religious Services: More than 4 times per year   Active Member of [employer] or Organizations: No   Attends Banker Meetings: Never   Marital Status: Married  Catering manager Violence: Not At Risk   Fear of Current or Ex-Partner: No  Emotionally Abused: No   Physically Abused: No   Sexually Abused: No    Outpatient Medications Prior to Visit  Medication Sig Dispense Refill   acetaminophen (TYLENOL) 500 MG tablet Take by mouth.     clonazePAM (KLONOPIN) 0.5 MG tablet Take 0.5 mg by mouth 2 (two) times daily.      cycloSPORINE (RESTASIS) 0.05 % ophthalmic emulsion Restasis 0.05 % eye drops in a dropperette  USE 1 DROP INTO BOTH EYES TWICE A DAY     diclofenac (VOLTAREN) 75 MG EC tablet TAKE ONE TABLET TWICE DAILY 180 tablet 0   eletriptan (RELPAX) 40 MG tablet May repeat in 2 hours if headache persists or recurs. 12 tablet 1   estradiol (VIVELLE-DOT) 0.075  MG/24HR Place 1 patch onto the skin 2 (two) times a week.     EUTHYROX 50 MCG tablet TAKE 1 TABLET BY MOUTH ONCE DAILY BEFORE BREAKFAST 90 tablet 1   hydrocortisone 2.5 % ointment Apply topically 2 (two) times daily. 30 g 0   imipramine (TOFRANIL) 10 MG tablet Take 3 tablets (30 mg total) by mouth at bedtime. 90 tablet 11   methocarbamol (ROBAXIN) 500 MG tablet Take 1 tablet (500 mg total) by mouth every 6 (six) hours as needed for muscle spasms. 30 tablet 5   mometasone (NASONEX) 50 MCG/ACT nasal spray Place 2 sprays into the nose daily. 51 g 1   Multiple Vitamins-Minerals (MULTIVITAMIN PO) Take by mouth.     Olopatadine HCl 0.2 % SOLN Apply 1 drop to eye in the morning and at bedtime. 2.5 mL 2   omeprazole (PRILOSEC) 20 MG capsule Take 1 capsule (20 mg total) by mouth daily. 90 capsule 3   ondansetron (ZOFRAN ODT) 4 MG disintegrating tablet Take 1 tablet (4 mg total) by mouth every 8 (eight) hours as needed for nausea or vomiting. 20 tablet 0   tacrolimus (PROTOPIC) 0.1 % ointment Apply topically 2 (two) times daily. 100 g 3   tretinoin (RETIN-A) 0.025 % cream Apply topically at bedtime. 45 g 3   amoxicillin-clavulanate (AUGMENTIN) 875-125 MG tablet Take 1 tablet by mouth 2 (two) times daily. 14 tablet 0   benzonatate (TESSALON PERLES) 100 MG capsule Take 1 capsule (100 mg total) by mouth 3 (three) times daily as needed for cough. 20 capsule 0   predniSONE (STERAPRED UNI-PAK 21 TAB) 10 MG (21) TBPK tablet 6 tablet day 1, 5 tablet day 2, 4 tablet day 3, 3 tablet day 4,  2 tablet day 5, 1 tablet day 6 1 each 0   No facility-administered medications prior to visit.    Allergies  Allergen Reactions   Latex Itching and Rash   Codeine    Gabapentin     "fuzzy"   Sulfa Antibiotics     Review of Systems  Constitutional:  Negative for chills.  HENT: Negative.    Respiratory: Negative.    Gastrointestinal:  Negative for nausea.  Genitourinary:  Positive for dysuria and frequency. Negative  for decreased urine volume, flank pain, hesitancy, pelvic pain and urgency.  Skin:  Negative for rash.  All other systems reviewed and are negative.     Objective:    Physical Exam Vitals and nursing note reviewed.  Constitutional:      Appearance: Normal appearance.  HENT:     Head: Normocephalic.     Nose: Nose normal.     Mouth/Throat:     Mouth: Mucous membranes are moist.  Eyes:     Conjunctiva/sclera: Conjunctivae normal.  Cardiovascular:     Pulses: Normal pulses.     Heart sounds: Normal heart sounds.  Pulmonary:     Effort: Pulmonary effort is normal.     Breath sounds: Normal breath sounds.  Abdominal:     General: Bowel sounds are normal.  Musculoskeletal:     Left shoulder: Tenderness present. Decreased range of motion.  Skin:    Findings: No rash.  Neurological:     Mental Status: She is alert and oriented to person, place, and time.    BP 98/63   Pulse 84   Temp 97.7 F (36.5 C) (Temporal)   Ht $R'5\' 4"'lF$  (1.626 m)   Wt 128 lb (58.1 kg)   SpO2 97%   BMI 21.97 kg/m  Wt Readings from Last 3 Encounters:  04/17/21 128 lb (58.1 kg)  02/18/21 127 lb (57.6 kg)  01/21/21 131 lb 6.4 oz (59.6 kg)    Health Maintenance Due  Topic Date Due   COVID-19 Vaccine (1) Never done   Pneumococcal Vaccine 40-65 Years old (1 - PCV) Never done   Zoster Vaccines- Shingrix (1 of 2) Never done   TETANUS/TDAP  04/14/2010   PAP SMEAR-Modifier  06/15/2021    There are no preventive care reminders to display for this patient.   Lab Results  Component Value Date   TSH 3.430 07/24/2020   Lab Results  Component Value Date   WBC 5.7 01/21/2021   HGB 13.6 01/21/2021   HCT 42.4 01/21/2021   MCV 90 01/21/2021   PLT 188 01/21/2021   Lab Results  Component Value Date   NA 145 (H) 01/21/2021   K 4.1 01/21/2021   CO2 27 01/21/2021   GLUCOSE 81 01/21/2021   BUN 31 (H) 01/21/2021   CREATININE 0.75 01/21/2021   BILITOT <0.2 01/21/2021   ALKPHOS 73 01/21/2021   AST 25  01/21/2021   ALT 21 01/21/2021   PROT 6.9 01/21/2021   ALBUMIN 4.4 01/21/2021   CALCIUM 9.2 01/21/2021   EGFR 94 01/21/2021   Lab Results  Component Value Date   CHOL 189 11/29/2018   Lab Results  Component Value Date   HDL 64 11/29/2018   Lab Results  Component Value Date   LDLCALC 107 (H) 11/29/2018   Lab Results  Component Value Date   TRIG 88 11/29/2018   Lab Results  Component Value Date   CHOLHDL 3.0 11/29/2018   No results found for: HGBA1C     Assessment & Plan:   Problem List Items Addressed This Visit       Other   Neck pain, acute    Ongoing neck pain caused by tension symptoms not well resolved.  Patient reports moderate pain.  Voltaren gel as needed to left mid/shoulder, warm compress, anti-inflammatories/Tylenol as needed for pain.  Continue Robaxin as prescribed.  Rx sent to pharmacy.  Patient knows to follow-up with worsening or unresolved symptoms.       Relevant Medications   diclofenac Sodium (VOLTAREN) 1 % GEL   Dysuria - Primary    Patient reports UTI symptoms.  On assessment no symptoms present this visit urinalysis negative for leukocytes, nitrites, bacteria.  Patient reports she increased her coffee and that has made her use the bathroom more frequently and she may be thinking that it is a UTI. Encourage patient to increase hydration, AZO OTC,  maintain good hygiene wiping from front to back, watch and wait for worsening symptoms and follow-up as needed. Urine sent to culture  Patient  verbalized understanding.  Advised to use anti-inflammatory/Tylenol for pain as needed.       Relevant Orders   Urinalysis, Routine w reflex microscopic (Completed)     Meds ordered this encounter  Medications   diclofenac Sodium (VOLTAREN) 1 % GEL    Sig: Apply 2 g topically 4 (four) times daily.    Dispense:  50 g    Refill:  1    Order Specific Question:   Supervising Provider    Answer:   Janora Norlander [[id number]]     Ivy Lynn,  NP

## 2021-04-21 ENCOUNTER — Telehealth: Payer: Self-pay | Admitting: Family

## 2021-04-21 NOTE — Progress Notes (Signed)
Pt calling back. Would like to talk to nurse about urine.

## 2021-04-21 NOTE — Telephone Encounter (Signed)
Pt called requesting to speak with nurse regarding her lab result for UTI.

## 2021-04-21 NOTE — Progress Notes (Signed)
Please call [phone]

## 2021-04-22 ENCOUNTER — Other Ambulatory Visit: Payer: Self-pay | Admitting: Nurse Practitioner

## 2021-04-22 ENCOUNTER — Other Ambulatory Visit: Payer: Medicare HMO

## 2021-04-22 ENCOUNTER — Other Ambulatory Visit: Payer: Self-pay

## 2021-04-22 DIAGNOSIS — R300 Dysuria: Secondary | ICD-10-CM | POA: Diagnosis not present

## 2021-04-22 MED ORDER — CEPHALEXIN 500 MG PO CAPS: 500.0000 mg | ORAL_CAPSULE | Freq: Two times a day (BID) | ORAL | 0 refills | Status: DC

## 2021-04-22 NOTE — Telephone Encounter (Signed)
Discussed labs with pt - aware of je message.   Lab was unable to add on CULTURE (due to time of order add on) _ pt is coming in today to leave that.  Je- she is getting worse each day - would like for Keflex to be called into [facility] in the mean time.

## 2021-04-22 NOTE — Telephone Encounter (Signed)
Pt aware of antibiotic- vm

## 2021-04-25 ENCOUNTER — Other Ambulatory Visit: Payer: Self-pay | Admitting: Nurse Practitioner

## 2021-04-25 DIAGNOSIS — N3000 Acute cystitis without hematuria: Secondary | ICD-10-CM

## 2021-04-25 LAB — URINE CULTURE

## 2021-04-25 MED ORDER — AMOXICILLIN-POT CLAVULANATE 875-125 MG PO TABS: 1.0000 | ORAL_TABLET | Freq: Two times a day (BID) | ORAL | 0 refills | Status: DC

## 2021-04-27 NOTE — Progress Notes (Signed)
Read message to pt 

## 2021-05-04 ENCOUNTER — Telehealth: Payer: Self-pay | Admitting: Nurse Practitioner

## 2021-05-04 ENCOUNTER — Other Ambulatory Visit: Payer: Self-pay | Admitting: Nurse Practitioner

## 2021-05-04 DIAGNOSIS — R110 Nausea: Secondary | ICD-10-CM

## 2021-05-04 MED ORDER — ONDANSETRON HCL 4 MG PO TABS: 4.0000 mg | ORAL_TABLET | Freq: Three times a day (TID) | ORAL | 0 refills | Status: DC | PRN

## 2021-05-04 NOTE — Telephone Encounter (Signed)
LMTCB

## 2021-05-05 NOTE — Telephone Encounter (Signed)
Returning nurse call.

## 2021-05-05 NOTE — Telephone Encounter (Signed)
Pt says she does not need zofran. She will finish the abx given for uti tomorrow. She says that the abx has not helped her and she feels worse. Please call back

## 2021-05-05 NOTE — Telephone Encounter (Signed)
Please advise.  Augmentin is susceptible to the bacteria that grew.

## 2021-05-06 NOTE — Telephone Encounter (Signed)
Pt has appt with Lenna Gilford next week and will just wait to be seen then.

## 2021-05-07 DIAGNOSIS — Z9189 Other specified personal risk factors, not elsewhere classified: Secondary | ICD-10-CM | POA: Diagnosis not present

## 2021-05-07 DIAGNOSIS — Z9289 Personal history of other medical treatment: Secondary | ICD-10-CM | POA: Diagnosis not present

## 2021-05-07 DIAGNOSIS — Z9011 Acquired absence of right breast and nipple: Secondary | ICD-10-CM | POA: Diagnosis not present

## 2021-05-07 DIAGNOSIS — Z86000 Personal history of in-situ neoplasm of breast: Secondary | ICD-10-CM | POA: Diagnosis not present

## 2021-05-07 DIAGNOSIS — Z1239 Encounter for other screening for malignant neoplasm of breast: Secondary | ICD-10-CM | POA: Diagnosis not present

## 2021-05-07 DIAGNOSIS — R922 Inconclusive mammogram: Secondary | ICD-10-CM | POA: Diagnosis not present

## 2021-05-11 ENCOUNTER — Telehealth: Payer: Self-pay | Admitting: Family

## 2021-05-11 ENCOUNTER — Other Ambulatory Visit: Payer: Self-pay

## 2021-05-11 ENCOUNTER — Encounter: Payer: Self-pay | Admitting: Nurse Practitioner

## 2021-05-11 ENCOUNTER — Ambulatory Visit (INDEPENDENT_AMBULATORY_CARE_PROVIDER_SITE_OTHER): Payer: Medicare HMO | Admitting: Nurse Practitioner

## 2021-05-11 VITALS — BP 101/69 | HR 97 | Temp 97.4°F | Resp 20 | Ht 64.0 in | Wt 129.0 lb

## 2021-05-11 DIAGNOSIS — S61235A Puncture wound without foreign body of left ring finger without damage to nail, initial encounter: Secondary | ICD-10-CM | POA: Diagnosis not present

## 2021-05-11 DIAGNOSIS — Z23 Encounter for immunization: Secondary | ICD-10-CM

## 2021-05-11 DIAGNOSIS — R300 Dysuria: Secondary | ICD-10-CM

## 2021-05-11 LAB — URINALYSIS, COMPLETE
Bilirubin, UA: NEGATIVE
Glucose, UA: NEGATIVE
Ketones, UA: NEGATIVE
Leukocytes,UA: NEGATIVE
Nitrite, UA: NEGATIVE
Protein,UA: NEGATIVE
Specific Gravity, UA: 1.015 (ref 1.005–1.030)
Urobilinogen, Ur: 0.2 mg/dL (ref 0.2–1.0)
pH, UA: 8.5 — ABNORMAL HIGH (ref 5.0–7.5)

## 2021-05-11 LAB — MICROSCOPIC EXAMINATION
Renal Epithel, UA: NONE SEEN /hpf
WBC, UA: NONE SEEN /hpf (ref 0–5)

## 2021-05-11 NOTE — Patient Instructions (Signed)
Dysuria ?Dysuria is pain or discomfort during urination. The pain or discomfort may be felt in the part of the body that drains urine from the bladder (urethra) or in the surrounding tissue of the genitals. The pain may also be felt in the groin area, lower abdomen, or lower back. ?You may have to urinate frequently or have the sudden feeling that you have to urinate (urgency). Dysuria can affect anyone, but it is more common in females. Dysuria can be caused by many different things, including: ?Urinary tract infection. ?Kidney stones or bladder stones. ?Certain STIs (sexually transmitted infections), such as chlamydia. ?Dehydration. ?Inflammation of the tissues of the vagina. ?Use of certain medicines. ?Use of certain soaps or scented products that cause irritation. ?Follow these instructions at home: ?Medicines ?Take over-the-counter and prescription medicines only as told by your health care provider. ?If you were prescribed an antibiotic medicine, take it as told by your health care provider. Do not stop taking the antibiotic even if you start to feel better. ?Eating and drinking ? ?Drink enough fluid to keep your urine pale yellow. ?Avoid caffeinated beverages, tea, and alcohol. These beverages can irritate the bladder and make dysuria worse. In males, alcohol may irritate the prostate. ?General instructions ?Watch your condition for any changes. ?Urinate often. Avoid holding urine for long periods of time. ?If you are female, you should wipe from front to back after urinating or having a bowel movement. Use each piece of toilet paper only once. ?Empty your bladder after sex. ?Keep all follow-up visits. This is important. ?If you had any tests done to find the cause of dysuria, it is up to you to get your test results. Ask your health care provider, or the department that is doing the test, when your results will be ready. ?Contact a health care provider if: ?You have a fever. ?You develop pain in your back or  sides. ?You have nausea or vomiting. ?You have blood in your urine. ?You are not urinating as often as you usually do. ?Get help right away if: ?Your pain is severe and not relieved with medicines. ?You cannot eat or drink without vomiting. ?You are confused. ?You have a rapid heartbeat while resting. ?You have shaking or chills. ?You feel extremely weak. ?Summary ?Dysuria is pain or discomfort while urinating. Many different conditions can lead to dysuria. ?If you have dysuria, you may have to urinate frequently or have the sudden feeling that you have to urinate (urgency). ?Watch your condition for any changes. Keep all follow-up visits. ?Make sure that you urinate often and drink enough fluid to keep your urine pale yellow. ?This information is not intended to replace advice given to you by your health care provider. Make sure you discuss any questions you have with your health care provider. ?Document Revised: 04/18/2020 Document Reviewed: 04/18/2020 ?Elsevier Patient Education ? 2022 Elsevier Inc[employer] ? ?

## 2021-05-11 NOTE — Telephone Encounter (Signed)
Patient returned call.  She thinks she may have a UTI.  Appointment scheduled with Shelah Lewandowsky for this and also Tetanus vaccine.

## 2021-05-11 NOTE — Telephone Encounter (Signed)
Left message to call back.  Patient does not need an office visit with a provider, she can schedule with triage when patient returns call.

## 2021-05-11 NOTE — Addendum Note (Signed)
Addended by: Rolena Infante on: 05/11/2021 02:32 PM   Modules accepted: Orders

## 2021-05-11 NOTE — Progress Notes (Addendum)
   Subjective:    Patient ID: Ruth Hopkins, female    DOB: 14-Feb-1966, 55 y.o.   MRN: [id number]  Chief Complaint: Dysuria and Cut finger and needs tetanus   HPI Patient was seen 04/17/21 with uti. She was told that urine was clear and she did not give her anything. Someone else looked at urine and sent her in keflex. Urine culture came back with proteus mirabilis. Not sure if keflex would cover it so she was changed to augmentin. She says she is still having occasional urinary frequency and urgency. Slight dysuria occasionally.   Puncture wound to left ring finger form a nail above her cabinets that she was cleaning- happened 3 days ago.  Review of Systems  Constitutional:  Negative for diaphoresis.  Eyes:  Negative for pain.  Respiratory:  Negative for shortness of breath.   Cardiovascular:  Negative for chest pain, palpitations and leg swelling.  Gastrointestinal:  Negative for abdominal pain.  Endocrine: Negative for polydipsia.  Genitourinary:  Positive for dysuria, frequency and urgency.  Skin:  Negative for rash.  Neurological:  Negative for dizziness, weakness and headaches.  Hematological:  Does not bruise/bleed easily.  All other systems reviewed and are negative.     Objective:   Physical Exam Vitals reviewed.  Constitutional:      Appearance: Normal appearance.  Cardiovascular:     Rate and Rhythm: Normal rate and regular rhythm.     Heart sounds: Normal heart sounds.  Pulmonary:     Effort: Pulmonary effort is normal.     Breath sounds: Normal breath sounds.  Skin:    General: Skin is warm.     Comments: Left ring finger has small puncture wound- no erythema  Neurological:     General: No focal deficit present.     Mental Status: She is alert and oriented to person, place, and time.  Psychiatric:        Mood and Affect: Mood normal.        Behavior: Behavior normal.    BP 101/69   Pulse 97   Temp (!) 97.4 F (36.3 C) (Temporal)   Resp 20   Ht '5\' 4"'$   (1.626 m)   Wt 129 lb (58.5 kg)   SpO2 100%   BMI 22.14 kg/m   Urine clear     Assessment & Plan:  Ruth Hopkins in today with chief complaint of Dysuria and Cut finger and needs tetanus   1. Dysuria Take medication as prescribe Cotton underwear Take shower not bath Cranberry juice, yogurt Force fluids AZO over the counter X2 days Culture pending- will do culture to make sure has resolved RTO prn  - Urinalysis, Complete  2. puncture wound left ring finger Tetanus given today  The above assessment and management plan was discussed with the patient. The patient verbalized understanding of and has agreed to the management plan. Patient is aware to call the clinic if symptoms persist or worsen. Patient is aware when to return to the clinic for a follow-up visit. Patient educated on when it is appropriate to go to the emergency department.   Mary-Margaret Hassell Done, FNP

## 2021-05-13 LAB — URINE CULTURE

## 2021-05-14 ENCOUNTER — Other Ambulatory Visit: Payer: Self-pay

## 2021-05-14 ENCOUNTER — Ambulatory Visit (INDEPENDENT_AMBULATORY_CARE_PROVIDER_SITE_OTHER): Payer: Medicare HMO | Admitting: Family

## 2021-05-14 ENCOUNTER — Other Ambulatory Visit: Payer: Self-pay | Admitting: Family

## 2021-05-14 ENCOUNTER — Encounter: Payer: Self-pay | Admitting: Family

## 2021-05-14 VITALS — BP 115/78 | HR 83 | Temp 98.1°F | Ht 64.0 in | Wt 130.8 lb

## 2021-05-14 DIAGNOSIS — R300 Dysuria: Secondary | ICD-10-CM | POA: Diagnosis not present

## 2021-05-14 DIAGNOSIS — G43901 Migraine, unspecified, not intractable, with status migrainosus: Secondary | ICD-10-CM | POA: Diagnosis not present

## 2021-05-14 DIAGNOSIS — F17200 Nicotine dependence, unspecified, uncomplicated: Secondary | ICD-10-CM | POA: Diagnosis not present

## 2021-05-14 DIAGNOSIS — R69 Illness, unspecified: Secondary | ICD-10-CM | POA: Diagnosis not present

## 2021-05-14 DIAGNOSIS — F419 Anxiety disorder, unspecified: Secondary | ICD-10-CM

## 2021-05-14 DIAGNOSIS — K219 Gastro-esophageal reflux disease without esophagitis: Secondary | ICD-10-CM

## 2021-05-14 DIAGNOSIS — F339 Major depressive disorder, recurrent, unspecified: Secondary | ICD-10-CM

## 2021-05-14 DIAGNOSIS — E039 Hypothyroidism, unspecified: Secondary | ICD-10-CM

## 2021-05-14 DIAGNOSIS — M797 Fibromyalgia: Secondary | ICD-10-CM | POA: Diagnosis not present

## 2021-05-14 LAB — URINALYSIS, COMPLETE
Bilirubin, UA: NEGATIVE
Glucose, UA: NEGATIVE
Ketones, UA: NEGATIVE
Leukocytes,UA: NEGATIVE
Nitrite, UA: NEGATIVE
Protein,UA: NEGATIVE
Specific Gravity, UA: 1.02 (ref 1.005–1.030)
Urobilinogen, Ur: 0.2 mg/dL (ref 0.2–1.0)
pH, UA: 6.5 (ref 5.0–7.5)

## 2021-05-14 LAB — MICROSCOPIC EXAMINATION: Bacteria, UA: NONE SEEN

## 2021-05-14 MED ORDER — CLONAZEPAM 0.5 MG PO TABS: 0.5000 mg | ORAL_TABLET | Freq: Two times a day (BID) | ORAL | 2 refills | Status: DC

## 2021-05-14 MED ORDER — ELETRIPTAN HYDROBROMIDE 40 MG PO TABS: ORAL_TABLET | ORAL | 1 refills | Status: DC

## 2021-05-14 MED ORDER — TOPIRAMATE 25 MG PO TABS: ORAL_TABLET | ORAL | 0 refills | Status: DC

## 2021-05-14 MED ORDER — TOPIRAMATE 50 MG PO TABS: 50.0000 mg | ORAL_TABLET | Freq: Two times a day (BID) | ORAL | 2 refills | Status: DC

## 2021-05-14 NOTE — Patient Instructions (Signed)
Migraine Headache A migraine headache is an intense, throbbing pain on one side or both sides of the head. Migraine headaches may also cause other symptoms, such as nausea, vomiting, and sensitivity to light and noise. A migraine headache can last from 4 hours to 3 days. Talk with your doctor about what things may bring on (trigger) your migraine headaches. What are the causes? The exact cause of this condition is not known. However, a migraine may be caused when nerves in the brain become irritated and release chemicals that cause inflammation of blood vessels. This inflammation causes pain. This condition may be triggered or caused by: Drinking alcohol. Smoking. Taking medicines, such as: Medicine used to treat chest pain (nitroglycerin). Birth control pills. Estrogen. Certain blood pressure medicines. Eating or drinking products that contain nitrates, glutamate, aspartame, or tyramine. Aged cheeses, chocolate, or caffeine may also be triggers. Doing physical activity. Other things that may trigger a migraine headache include: Menstruation. Pregnancy. Hunger. Stress. Lack of sleep or too much sleep. Weather changes. Fatigue. What increases the risk? The following factors may make you more likely to experience migraine headaches: Being a certain age. This condition is more common in people who are 25-55 years old. Being female. Having a family history of migraine headaches. Being Caucasian. Having a mental health condition, such as depression or anxiety. Being obese. What are the signs or symptoms? The main symptom of this condition is pulsating or throbbing pain. This pain may: Happen in any area of the head, such as on one side or both sides. Interfere with daily activities. Get worse with physical activity. Get worse with exposure to bright lights or loud noises. Other symptoms may include: Nausea. Vomiting. Dizziness. General sensitivity to bright lights, loud noises, or  smells. Before you get a migraine headache, you may get warning signs (an aura). An aura may include: Seeing flashing lights or having blind spots. Seeing bright spots, halos, or zigzag lines. Having tunnel vision or blurred vision. Having numbness or a tingling feeling. Having trouble talking. Having muscle weakness. Some people have symptoms after a migraine headache (postdromal phase), such as: Feeling tired. Difficulty concentrating. How is this diagnosed? A migraine headache can be diagnosed based on: Your symptoms. A physical exam. Tests, such as: CT scan or an MRI of the head. These imaging tests can help rule out other causes of headaches. Taking fluid from the spine (lumbar puncture) and analyzing it (cerebrospinal fluid analysis, or CSF analysis). How is this treated? This condition may be treated with medicines that: Relieve pain. Relieve nausea. Prevent migraine headaches. Treatment for this condition may also include: Acupuncture. Lifestyle changes like avoiding foods that trigger migraine headaches. Biofeedback. Cognitive behavioral therapy. Follow these instructions at home: Medicines Take over-the-counter and prescription medicines only as told by your health care provider. Ask your health care provider if the medicine prescribed to you: Requires you to avoid driving or using heavy machinery. Can cause constipation. You may need to take these actions to prevent or treat constipation: Drink enough fluid to keep your urine pale yellow. Take over-the-counter or prescription medicines. Eat foods that are high in fiber, such as beans, whole grains, and fresh fruits and vegetables. Limit foods that are high in fat and processed sugars, such as fried or sweet foods. Lifestyle Do not drink alcohol. Do not use any products that contain nicotine or tobacco, such as cigarettes, e-cigarettes, and chewing tobacco. If you need help quitting, ask your health care  provider. Get at least 8   hours of sleep every night. Find ways to manage stress, such as meditation, deep breathing, or yoga. General instructions   Keep a journal to find out what may trigger your migraine headaches. For example, write down: What you eat and drink. How much sleep you get. Any change to your diet or medicines. If you have a migraine headache: Avoid things that make your symptoms worse, such as bright lights. It may help to lie down in a dark, quiet room. Do not drive or use heavy machinery. Ask your health care provider what activities are safe for you while you are experiencing symptoms. Keep all follow-up visits as told by your health care provider. This is important. Contact a health care provider if: You develop symptoms that are different or more severe than your usual migraine headache symptoms. You have more than 15 headache days in one month. Get help right away if: Your migraine headache becomes severe. Your migraine headache lasts longer than 72 hours. You have a fever. You have a stiff neck. You have vision loss. Your muscles feel weak or like you cannot control them. You start to lose your balance often. You have trouble walking. You faint. You have a seizure. Summary A migraine headache is an intense, throbbing pain on one side or both sides of the head. Migraines may also cause other symptoms, such as nausea, vomiting, and sensitivity to light and noise. This condition may be treated with medicines and lifestyle changes. You may also need to avoid certain things that trigger a migraine headache. Keep a journal to find out what may trigger your migraine headaches. Contact your health care provider if you have more than 15 headache days in a month or you develop symptoms that are different or more severe than your usual migraine headache symptoms. This information is not intended to replace advice given to you by your health care provider. Make sure you  discuss any questions you have with your health care provider. Document Revised: 12/29/2018 Document Reviewed: 10/19/2018 Elsevier Patient Education  2022 Elsevier Inc.  

## 2021-05-14 NOTE — Progress Notes (Signed)
Subjective:    Patient ID: Ruth Hopkins, female    DOB: 1966/02/11, 55 y.o.   MRN: [id number]  Chief Complaint  Patient presents with   Migraine    REFERRAL dAVID MEYER    PT presents to the office today for chronic follow up. She is followed by psychiatrist 3 months for hot flashes and GAD.She is currently taking estradiol patch and gradually decreasing and trying to add other medications. She has tried gabapentin, Lexapro, and Effexor, and wellbutrin without success.  She has fibromyalgia and states she gets neck pain that causes her migraines. She is followed by GYN annually.  Migraine  This is a chronic problem. The current episode started more than 1 year ago. Episode frequency: 15 days. The pain is located in the Occipital region. The pain quality is similar to prior headaches. The quality of the pain is described as aching. Associated symptoms include nausea, phonophobia and photophobia. Pertinent negatives include no vomiting. She has tried triptans for the symptoms. The treatment provided mild relief.  Gastroesophageal Reflux She complains of belching, heartburn and nausea. This is a chronic problem. The current episode started more than 1 year ago. The problem occurs occasionally. Pertinent negatives include no fatigue. She has tried a PPI for the symptoms. The treatment provided moderate relief.  Thyroid Problem Presents for follow-up visit. Symptoms include anxiety, constipation, depressed mood and hair loss. Patient reports no fatigue. The symptoms have been stable.  Nicotine Dependence Presents for follow-up visit. Symptoms are negative for fatigue. Her urge triggers include company of smokers. The symptoms have been stable. She smokes 1 pack of cigarettes per day.  Depression        This is a chronic problem.  The current episode started more than 1 year ago.   The onset quality is sudden.   Associated symptoms include helplessness, hopelessness, irritable, restlessness and sad.   Associated symptoms include no fatigue.  Past medical history includes thyroid problem and anxiety.   Anxiety Presents for follow-up visit. Symptoms include depressed mood, excessive worry, nausea, nervous/anxious behavior and restlessness. Symptoms occur most days. The severity of symptoms is moderate.    Dysuria  This is a new problem. The current episode started in the past 7 days. The problem has been waxing and waning. The quality of the pain is described as burning. The pain is at a severity of 1/10. Associated symptoms include nausea. Pertinent negatives include no vomiting.     Review of Systems  Constitutional:  Negative for fatigue.  Eyes:  Positive for photophobia.  Gastrointestinal:  Positive for constipation, heartburn and nausea. Negative for vomiting.  Genitourinary:  Positive for dysuria.  Psychiatric/Behavioral:  Positive for depression. The patient is nervous/anxious.   All other systems reviewed and are negative.     Objective:   Physical Exam Vitals reviewed.  Constitutional:      General: She is irritable. She is not in acute distress.    Appearance: She is well-developed.  HENT:     Head: Normocephalic and atraumatic.     Right Ear: Tympanic membrane normal.     Left Ear: Tympanic membrane normal.  Eyes:     Pupils: Pupils are equal, round, and reactive to light.  Neck:     Thyroid: No thyromegaly.  Cardiovascular:     Rate and Rhythm: Normal rate and regular rhythm.     Heart sounds: Normal heart sounds. No murmur heard. Pulmonary:     Effort: Pulmonary effort is normal. No respiratory  distress.     Breath sounds: Normal breath sounds. No wheezing.  Abdominal:     General: Bowel sounds are normal. There is no distension.     Palpations: Abdomen is soft.     Tenderness: There is no abdominal tenderness.  Musculoskeletal:        General: No tenderness. Normal range of motion.     Cervical back: Normal range of motion and neck supple.  Skin:     General: Skin is warm and dry.  Neurological:     Mental Status: She is alert and oriented to person, place, and time.     Cranial Nerves: No cranial nerve deficit.     Deep Tendon Reflexes: Reflexes are normal and symmetric.  Psychiatric:        Behavior: Behavior normal.        Thought Content: Thought content normal.        Judgment: Judgment normal.     BP 115/78   Pulse 83   Temp 98.1 F (36.7 C) (Temporal)   Ht _0  (1.626 m)   Wt 130 lb 12.8 oz (59.3 kg)   BMI 22.45 kg/m       Assessment & Plan:  DELISSA SILBA comes in today with chief complaint of Migraine (REFERRAL dAVID MEYER )   Diagnosis and orders addressed:  1. Dysuria - Urinalysis, Complete - Urine Culture - CMP14+EGFR - CBC with Differential/Platelet  2. Migraine with status migrainosus, not intractable, unspecified migraine type -Start Topamax 25 mg BID for 2 weeks then increase to 50 mg BID Headache journal   - CMP14+EGFR - CBC with Differential/Platelet - eletriptan (RELPAX) 40 MG tablet; May repeat in 2 hours if headache persists or recurs.  Dispense: 36 tablet; Refill: 1  3. Gastroesophageal reflux disease, unspecified whether esophagitis present - CMP14+EGFR - CBC with Differential/Platelet  4. Smoker - CMP14+EGFR - CBC with Differential/Platelet  5. Depression, recurrent ([place]) - CMP14+EGFR - CBC with Differential/Platelet  6. Anxiety - CMP14+EGFR - CBC with Differential/Platelet  7. Fibromyalgia - CMP14+EGFR - CBC with Differential/Platelet  8. Acquired hypothyroidism - CMP14+EGFR - CBC with Differential/Platelet - TSH   Labs pending Health Maintenance reviewed Diet and exercise encouraged  Follow up plan: 3 months    Evelina Dun, FNP

## 2021-05-15 LAB — CMP14+EGFR
ALT: 23 IU/L (ref 0–32)
AST: 19 IU/L (ref 0–40)
Albumin/Globulin Ratio: 1.7 (ref 1.2–2.2)
Albumin: 4.3 g/dL (ref 3.8–4.9)
Alkaline Phosphatase: 68 IU/L (ref 44–121)
BUN/Creatinine Ratio: 35 — ABNORMAL HIGH (ref 9–23)
BUN: 28 mg/dL — ABNORMAL HIGH (ref 6–24)
Bilirubin Total: <0.2 mg/dL (ref 0.0–1.2)
CO2: 23 mmol/L (ref 20–29)
Calcium: 8.9 mg/dL (ref 8.7–10.2)
Chloride: 103 mmol/L (ref 96–106)
Creatinine, Ser: 0.81 mg/dL (ref 0.57–1.00)
Globulin, Total: 2.5 g/dL (ref 1.5–4.5)
Glucose: 77 mg/dL (ref 65–99)
Potassium: 4.3 mmol/L (ref 3.5–5.2)
Sodium: 139 mmol/L (ref 134–144)
Total Protein: 6.8 g/dL (ref 6.0–8.5)
eGFR: 86 mL/min/{1.73_m2} (ref >59)

## 2021-05-15 LAB — CBC WITH DIFFERENTIAL/PLATELET
Basophils Absolute: 0.1 10*3/uL (ref 0.0–0.2)
Basos: 1 %
EOS (ABSOLUTE): 0.2 10*3/uL (ref 0.0–0.4)
Eos: 5 %
Hematocrit: 39.1 % (ref 34.0–46.6)
Hemoglobin: 12.9 g/dL (ref 11.1–15.9)
Immature Grans (Abs): 0 10*3/uL (ref 0.0–0.1)
Immature Granulocytes: 0 %
Lymphocytes Absolute: 2.1 10*3/uL (ref 0.7–3.1)
Lymphs: 44 %
MCH: 29.4 pg (ref 26.6–33.0)
MCHC: 33 g/dL (ref 31.5–35.7)
MCV: 89 fL (ref 79–97)
Monocytes Absolute: 0.3 10*3/uL (ref 0.1–0.9)
Monocytes: 7 %
Neutrophils Absolute: 2 10*3/uL (ref 1.4–7.0)
Neutrophils: 43 %
Platelets: 173 10*3/uL (ref 150–450)
RBC: 4.39 x10E6/uL (ref 3.77–5.28)
RDW: 11.6 % — ABNORMAL LOW (ref 11.7–15.4)
WBC: 4.6 10*3/uL (ref 3.4–10.8)

## 2021-05-15 LAB — TSH: TSH: 2.13 u[IU]/mL (ref 0.450–4.500)

## 2021-05-16 LAB — URINE CULTURE

## 2021-06-10 DIAGNOSIS — H04129 Dry eye syndrome of unspecified lacrimal gland: Secondary | ICD-10-CM | POA: Diagnosis not present

## 2021-06-10 DIAGNOSIS — M797 Fibromyalgia: Secondary | ICD-10-CM | POA: Diagnosis not present

## 2021-06-10 DIAGNOSIS — G8929 Other chronic pain: Secondary | ICD-10-CM | POA: Diagnosis not present

## 2021-06-10 DIAGNOSIS — G43909 Migraine, unspecified, not intractable, without status migrainosus: Secondary | ICD-10-CM | POA: Diagnosis not present

## 2021-06-10 DIAGNOSIS — E039 Hypothyroidism, unspecified: Secondary | ICD-10-CM | POA: Diagnosis not present

## 2021-06-10 DIAGNOSIS — M5430 Sciatica, unspecified side: Secondary | ICD-10-CM | POA: Diagnosis not present

## 2021-06-10 DIAGNOSIS — K219 Gastro-esophageal reflux disease without esophagitis: Secondary | ICD-10-CM | POA: Diagnosis not present

## 2021-06-10 DIAGNOSIS — R69 Illness, unspecified: Secondary | ICD-10-CM | POA: Diagnosis not present

## 2021-06-25 ENCOUNTER — Ambulatory Visit (INDEPENDENT_AMBULATORY_CARE_PROVIDER_SITE_OTHER): Payer: Medicare HMO | Admitting: Family

## 2021-06-25 ENCOUNTER — Encounter: Payer: Self-pay | Admitting: Family

## 2021-06-25 DIAGNOSIS — M5442 Lumbago with sciatica, left side: Secondary | ICD-10-CM

## 2021-06-25 MED ORDER — PREDNISONE 10 MG (21) PO TBPK: ORAL_TABLET | ORAL | 0 refills | Status: DC

## 2021-06-25 MED ORDER — DICLOFENAC SODIUM 75 MG PO TBEC: 75.0000 mg | DELAYED_RELEASE_TABLET | Freq: Two times a day (BID) | ORAL | 0 refills | Status: DC

## 2021-06-25 NOTE — Progress Notes (Signed)
Virtual Visit  Note Due to COVID-19 pandemic this visit was conducted virtually. This visit type was conducted due to national recommendations for restrictions regarding the COVID-19 Pandemic (e.g. social distancing, sheltering in place) in an effort to limit this patient's exposure and mitigate transmission in our community. All issues noted in this document were discussed and addressed.  A physical exam was not performed with this format.  I connected with Ruth Hopkins on 06/25/21 at 9:56 AM  by telephone and verified that I am speaking with the correct person using two identifiers. Ruth Hopkins is currently located at home and no one is currently with her during visit. The provider, Evelina Dun, FNP is located in their office at time of visit.  I discussed the limitations, risks, security and privacy concerns of performing an evaluation and management service by telephone and the availability of in person appointments. I also discussed with the patient that there may be a patient responsible charge related to this service. The patient expressed understanding and agreed to proceed.  Ruth Hopkins, Ruth Hopkins are scheduled for a virtual visit with your provider today.    Just as we do with appointments in the office, we must obtain your consent to participate.  Your consent will be active for this visit and any virtual visit you may have with one of our providers in the next 365 days.    If you have a MyChart account, I can also send a copy of this consent to you electronically.  All virtual visits are billed to your insurance company just like a traditional visit in the office.  As this is a virtual visit, video technology does not allow for your provider to perform a traditional examination.  This may limit your provider's ability to fully assess your condition.  If your provider identifies any concerns that need to be evaluated in person or the need to arrange testing such as labs, EKG, etc, we will make  arrangements to do so.    Although advances in technology are sophisticated, we cannot ensure that it will always work on either your end or our end.  If the connection with a video visit is poor, we may have to switch to a telephone visit.  With either a video or telephone visit, we are not always able to ensure that we have a secure connection.   I need to obtain your verbal consent now.   Are you willing to proceed with your visit today?   Ruth Hopkins has provided verbal consent on 06/25/2021 for a virtual visit (video or telephone).   Evelina Dun, [place] 06/25/2021  9:58 AM    History and Present Illness:  Hip Pain  The incident occurred 12 to 24 hours ago. Incident location: mopped her house. The pain is present in the left hip. The quality of the pain is described as aching. The pain is at a severity of 7/10. The pain is mild. Pertinent negatives include no loss of motion, loss of sensation, muscle weakness or numbness. She reports no foreign bodies present. The symptoms are aggravated by weight bearing. She has tried acetaminophen for the symptoms. The treatment provided mild relief.  Back Pain This is a new problem. The current episode started yesterday. The pain is present in the gluteal. The quality of the pain is described as aching. The pain is at a severity of 7/10. Pertinent negatives include no numbness.     Review of Systems  Musculoskeletal:  Positive for  back pain.  Neurological:  Negative for numbness.    Observations/Objective: No SOB or distress noted   Assessment and Plan: 1. Acute left-sided low back pain with left-sided sciatica Rest ROM exercises encouraged No other NSAID's  RTO if symptoms worsen or do not improve  - predniSONE (STERAPRED UNI-PAK 21 TAB) 10 MG (21) TBPK tablet; Use as directed  Dispense: 21 tablet; Refill: 0 - diclofenac (VOLTAREN) 75 MG EC tablet; Take 1 tablet (75 mg total) by mouth 2 (two) times daily.  Dispense: 180 tablet; Refill:  0     I discussed the assessment and treatment plan with the patient. The patient was provided an opportunity to ask questions and all were answered. The patient agreed with the plan and demonstrated an understanding of the instructions.   The patient was advised to call back or seek an in-person evaluation if the symptoms worsen or if the condition fails to improve as anticipated.  The above assessment and management plan was discussed with the patient. The patient verbalized understanding of and has agreed to the management plan. Patient is aware to call the clinic if symptoms persist or worsen. Patient is aware when to return to the clinic for a follow-up visit. Patient educated on when it is appropriate to go to the emergency department.   Time call ended:  10:07 AM   I provided 11 minutes of  non face-to-face time during this encounter.    Evelina Dun, FNP

## 2021-07-10 ENCOUNTER — Other Ambulatory Visit: Payer: Self-pay

## 2021-07-10 ENCOUNTER — Encounter: Payer: Self-pay | Admitting: Family

## 2021-07-10 ENCOUNTER — Ambulatory Visit (INDEPENDENT_AMBULATORY_CARE_PROVIDER_SITE_OTHER): Payer: Medicare HMO | Admitting: Family

## 2021-07-10 VITALS — BP 109/73 | HR 80 | Temp 97.7°F | Ht 64.0 in | Wt 128.8 lb

## 2021-07-10 DIAGNOSIS — F411 Generalized anxiety disorder: Secondary | ICD-10-CM

## 2021-07-10 DIAGNOSIS — R69 Illness, unspecified: Secondary | ICD-10-CM | POA: Diagnosis not present

## 2021-07-10 DIAGNOSIS — R5383 Other fatigue: Secondary | ICD-10-CM

## 2021-07-10 DIAGNOSIS — F320 Major depressive disorder, single episode, mild: Secondary | ICD-10-CM | POA: Diagnosis not present

## 2021-07-10 DIAGNOSIS — R300 Dysuria: Secondary | ICD-10-CM | POA: Diagnosis not present

## 2021-07-10 LAB — MICROSCOPIC EXAMINATION
Bacteria, UA: NONE SEEN
Renal Epithel, UA: NONE SEEN /hpf
WBC, UA: NONE SEEN /hpf (ref 0–5)

## 2021-07-10 LAB — URINALYSIS, COMPLETE
Bilirubin, UA: NEGATIVE
Glucose, UA: NEGATIVE
Ketones, UA: NEGATIVE
Leukocytes,UA: NEGATIVE
Nitrite, UA: NEGATIVE
Protein,UA: NEGATIVE
Specific Gravity, UA: 1.02 (ref 1.005–1.030)
Urobilinogen, Ur: 0.2 mg/dL (ref 0.2–1.0)
pH, UA: 6.5 (ref 5.0–7.5)

## 2021-07-10 MED ORDER — BUPROPION HCL ER (XL) 150 MG PO TB24: 150.0000 mg | ORAL_TABLET | Freq: Every day | ORAL | 1 refills | Status: DC

## 2021-07-10 NOTE — Progress Notes (Signed)
Subjective:    Patient ID: Ruth Hopkins, female    DOB: 1965-10-05, 55 y.o.   MRN: [id number]  Chief Complaint  Patient presents with   Urinary Tract Infection   Pt presents to the office today with urinary frequency over the last week.   She also has chronic fatigue, depression, and GAD. She stopped her Wellbutrin because she was having hair loss. Her hair loss has not improved since stopping.  Urinary Frequency  This is a new problem. The current episode started 1 to 4 weeks ago. The problem occurs intermittently. The problem has been waxing and waning. The patient is experiencing no pain. Associated symptoms include frequency, nausea and urgency. Pertinent negatives include no discharge, flank pain, hematuria or hesitancy. She has tried increased fluids for the symptoms. The treatment provided mild relief.  Depression        This is a chronic problem.  The current episode started more than 1 year ago.   The problem occurs intermittently.  Associated symptoms include fatigue, helplessness, hopelessness, irritable, restlessness, decreased interest and sad.  Past medical history includes anxiety.   Anxiety Presents for follow-up visit. Symptoms include excessive worry, irritability, nausea and restlessness. Symptoms occur most days. The severity of symptoms is moderate.       Review of Systems  Constitutional:  Positive for fatigue and irritability.  Gastrointestinal:  Positive for nausea.  Genitourinary:  Positive for frequency and urgency. Negative for flank pain, hematuria and hesitancy.  Psychiatric/Behavioral:  Positive for depression.   All other systems reviewed and are negative.     Objective:   Physical Exam Vitals reviewed.  Constitutional:      General: She is irritable. She is not in acute distress.    Appearance: She is well-developed.  HENT:     Head: Normocephalic and atraumatic.     Right Ear: Tympanic membrane normal.     Left Ear: Tympanic membrane normal.   Eyes:     Pupils: Pupils are equal, round, and reactive to light.  Neck:     Thyroid: No thyromegaly.  Cardiovascular:     Rate and Rhythm: Normal rate and regular rhythm.     Heart sounds: Normal heart sounds. No murmur heard. Pulmonary:     Effort: Pulmonary effort is normal. No respiratory distress.     Breath sounds: Normal breath sounds. No wheezing.  Abdominal:     General: Bowel sounds are normal. There is no distension.     Palpations: Abdomen is soft.     Tenderness: There is no abdominal tenderness.  Musculoskeletal:        General: No tenderness. Normal range of motion.     Cervical back: Normal range of motion and neck supple.  Skin:    General: Skin is warm and dry.  Neurological:     Mental Status: She is alert and oriented to person, place, and time.     Cranial Nerves: No cranial nerve deficit.     Deep Tendon Reflexes: Reflexes are normal and symmetric.  Psychiatric:        Behavior: Behavior normal.        Thought Content: Thought content normal.        Judgment: Judgment normal.      BP 109/73   Pulse 80   Temp 97.7 F (36.5 C) (Temporal)   Ht 5\' 4"  (1.626 m)   Wt 128 lb 12.8 oz (58.4 kg)   BMI 22.11 kg/m      Assessment &  Plan:  STEPHANYE FINNICUM comes in today with chief complaint of Urinary Tract Infection   Diagnosis and orders addressed:  1. Dysuria No infection present at this time Force fluids - Urinalysis, Complete - Urine Culture  2. Other fatigue  3. GAD (generalized anxiety disorder) Pt will restart Wellbutrin 150 mg daily Keep behavorial follow up  - buPROPion (WELLBUTRIN XL) 150 MG 24 hr tablet; Take 1 tablet (150 mg total) by mouth daily.  Dispense: 90 tablet; Refill: 1  4. Depression, major, single episode, mild (HCC) - buPROPion (WELLBUTRIN XL) 150 MG 24 hr tablet; Take 1 tablet (150 mg total) by mouth daily.  Dispense: 90 tablet; Refill: 1   Health Maintenance reviewed Diet and exercise encouraged  Follow up  plan: Keep chronic follow up   Evelina Dun, FNP

## 2021-07-10 NOTE — Patient Instructions (Signed)

## 2021-07-12 LAB — URINE CULTURE: Organism ID, Bacteria: NO GROWTH

## 2021-07-14 ENCOUNTER — Telehealth: Payer: Self-pay | Admitting: Family

## 2021-07-15 NOTE — Telephone Encounter (Signed)
lmtcb

## 2021-07-21 ENCOUNTER — Other Ambulatory Visit: Payer: Self-pay

## 2021-07-21 ENCOUNTER — Encounter: Payer: Self-pay | Admitting: Family

## 2021-07-21 ENCOUNTER — Ambulatory Visit (INDEPENDENT_AMBULATORY_CARE_PROVIDER_SITE_OTHER): Payer: Medicare HMO | Admitting: Family

## 2021-07-21 VITALS — BP 110/73 | HR 79 | Temp 97.9°F | Ht 64.0 in | Wt 129.0 lb

## 2021-07-21 DIAGNOSIS — R69 Illness, unspecified: Secondary | ICD-10-CM | POA: Diagnosis not present

## 2021-07-21 DIAGNOSIS — R300 Dysuria: Secondary | ICD-10-CM | POA: Diagnosis not present

## 2021-07-21 DIAGNOSIS — F419 Anxiety disorder, unspecified: Secondary | ICD-10-CM | POA: Diagnosis not present

## 2021-07-21 DIAGNOSIS — F17200 Nicotine dependence, unspecified, uncomplicated: Secondary | ICD-10-CM

## 2021-07-21 DIAGNOSIS — Z0001 Encounter for general adult medical examination with abnormal findings: Secondary | ICD-10-CM

## 2021-07-21 DIAGNOSIS — E039 Hypothyroidism, unspecified: Secondary | ICD-10-CM

## 2021-07-21 DIAGNOSIS — Z0000 Encounter for general adult medical examination without abnormal findings: Secondary | ICD-10-CM | POA: Diagnosis not present

## 2021-07-21 DIAGNOSIS — F339 Major depressive disorder, recurrent, unspecified: Secondary | ICD-10-CM | POA: Diagnosis not present

## 2021-07-21 DIAGNOSIS — K219 Gastro-esophageal reflux disease without esophagitis: Secondary | ICD-10-CM

## 2021-07-21 DIAGNOSIS — M797 Fibromyalgia: Secondary | ICD-10-CM

## 2021-07-21 LAB — URINALYSIS, COMPLETE
Bilirubin, UA: NEGATIVE
Glucose, UA: NEGATIVE
Leukocytes,UA: NEGATIVE
Nitrite, UA: NEGATIVE
Protein,UA: NEGATIVE
Specific Gravity, UA: 1.02 (ref 1.005–1.030)
Urobilinogen, Ur: 0.2 mg/dL (ref 0.2–1.0)
pH, UA: 5.5 (ref 5.0–7.5)

## 2021-07-21 LAB — MICROSCOPIC EXAMINATION
Renal Epithel, UA: NONE SEEN /hpf
WBC, UA: NONE SEEN /hpf (ref 0–5)

## 2021-07-21 NOTE — Patient Instructions (Signed)
Urinary Frequency, Adult Urinary frequency means urinating more often than usual. You may urinate every 1-2 hours even though you drink a normal amount of fluid and do not have a bladder infection or condition. Although you urinate more often than normal,the total amount of urine produced in a day is normal. With urinary frequency, you may have an urgent need to urinate often. The stress and anxiety of needing to find a bathroom quickly can make this urge worse. This condition may go away on its own or you may need treatment at home. Home treatment may include bladder training, exercises, taking medicines, ormaking changes to your diet. Follow these instructions at home: Bladder health  Keep a bladder diary if told by your health care provider. Keep track of: What you eat and drink. How often you urinate. How much you urinate. Follow a bladder training program if told by your health care provider. This may include: Learning to delay going to the bathroom. Double urinating (voiding). This helps if you are not completely emptying your bladder. Scheduled voiding. Do Kegel exercises as told by your health care provider. Kegel exercises strengthen the muscles that help control urination, which may help the condition.  Eating and drinking If told by your health care provider, make diet changes, such as: Avoiding caffeine. Drinking fewer fluids, especially alcohol. Not drinking in the evening. Avoiding foods or drinks that may irritate the bladder. These include coffee, tea, soda, artificial sweeteners, citrus, tomato-based foods, and chocolate. Eating foods that help prevent or ease constipation. Constipation can make this condition worse. Your health care provider may recommend that you: Drink enough fluid to keep your urine pale yellow. Take over-the-counter or prescription medicines. Eat foods that are high in fiber, such as beans, whole grains, and fresh fruits and vegetables. Limit foods  that are high in fat and processed sugars, such as fried or sweet foods. General instructions Take over-the-counter and prescription medicines only as told by your health care provider. Keep all follow-up visits as told by your health care provider. This is important. Contact a health care provider if: You start urinating more often. You feel pain or irritation when you urinate. You notice blood in your urine. Your urine looks cloudy. You develop a fever. You begin vomiting. Get help right away if: You are unable to urinate. Summary Urinary frequency means urinating more often than usual. With urinary frequency, you may urinate every 1-2 hours even though you drink a normal amount of fluid and do not have a bladder infection or other bladder condition. Your health care provider may recommend that you keep a bladder diary, follow a bladder training program, or make dietary changes. If told by your health care provider, do Kegel exercises to strengthen the muscles that help control urination. Take over-the-counter and prescription medicines only as told by your health care provider. Contact a health care provider if your symptoms do not improve or get worse. This information is not intended to replace advice given to you by your health care provider. Make sure you discuss any questions you have with your healthcare provider. Document Revised: 03/16/2018 Document Reviewed: 03/16/2018 Elsevier Patient Education  2021 [employer] Inc[employer]  

## 2021-07-21 NOTE — Progress Notes (Signed)
Subjective:    Patient ID: Ruth Hopkins, female    DOB: 1966/08/27, 55 y.o.   MRN: [id number]  Chief Complaint  Patient presents with   Medical Management of Chronic Issues   Urinary Tract Infection   PT presents to the office today for CPE and chronic follow up. She is followed by psychiatrist 3 months for hot flashes and GAD.She is currently taking estradiol patch and gradually decreasing and trying to add other medications. She has tried gabapentin, Lexapro, and Effexor, and wellbutrin without success.   She has fibromyalgia and states she gets neck pain that causes her migraines. She is followed by GYN annually.  Urinary Frequency  This is a recurrent problem. The current episode started 1 to 4 weeks ago. The problem occurs intermittently. The quality of the pain is described as burning. The pain is at a severity of 2/10. The pain is mild. Associated symptoms include frequency. She has tried increased fluids for the symptoms. The treatment provided mild relief.  Migraine  This is a chronic problem. The current episode started more than 1 year ago. The problem occurs intermittently.  Gastroesophageal Reflux She complains of belching and heartburn. This is a chronic problem. The current episode started more than 1 year ago. The problem occurs occasionally. Associated symptoms include fatigue. She has tried a PPI for the symptoms. The treatment provided moderate relief.  Thyroid Problem Presents for follow-up visit. Symptoms include anxiety, depressed mood and fatigue. The symptoms have been stable.  Anxiety Presents for follow-up visit. Symptoms include depressed mood, excessive worry, irritability, nervous/anxious behavior and restlessness. Symptoms occur most days. The severity of symptoms is severe.       Review of Systems  Constitutional:  Positive for fatigue and irritability.  Gastrointestinal:  Positive for heartburn.  Genitourinary:  Positive for frequency.   Psychiatric/Behavioral:  The patient is nervous/anxious.   All other systems reviewed and are negative.  Family History  Problem Relation Age of Onset   Asthma Mother    Hypothyroidism Mother    Hypertension Mother    Raynaud syndrome Mother    Hypertension Father    Social History   Socioeconomic History   Marital status: Married    Spouse name: Not on file   Number of children: Not on file   Years of education: Not on file   Highest education level: Not on file  Occupational History   Not on file  Tobacco Use   Smoking status: Light Smoker    Types: Cigarettes   Smokeless tobacco: Never  Vaping Use   Vaping Use: Never used  Substance and Sexual Activity   Alcohol use: No   Drug use: No   Sexual activity: Not on file  Other Topics Concern   Not on file  Social History Narrative   Lives home with husband   Social Determinants of Health   Financial Resource Strain: Low Risk    Difficulty of Paying Living Expenses: Not hard at all  Food Insecurity: No Food Insecurity   Worried About Charity fundraiser in the Last Year: Never true   [place] in the Last Year: Never true  Transportation Needs: No Transportation Needs   Lack of Transportation (Medical): No   Lack of Transportation (Non-Medical): No  Physical Activity: Insufficiently Active   Days of Exercise per Week: 3 days   Minutes of Exercise per Session: 40 min  Stress: No Stress Concern Present   Feeling of Stress : Not  at all  Social Connections: Moderately Integrated   Frequency of Communication with Friends and Family: More than three times a week   Frequency of Social Gatherings with Friends and Family: Twice a week   Attends Religious Services: More than 4 times per year   Active Member of [employer] or Organizations: No   Attends Archivist Meetings: Never   Marital Status: Married       Objective:   Physical Exam Vitals reviewed.  Constitutional:      General: She is not in acute  distress.    Appearance: She is well-developed.  HENT:     Head: Normocephalic and atraumatic.     Right Ear: Tympanic membrane normal.     Left Ear: Tympanic membrane normal.  Eyes:     Pupils: Pupils are equal, round, and reactive to light.  Neck:     Thyroid: No thyromegaly.  Cardiovascular:     Rate and Rhythm: Normal rate and regular rhythm.     Heart sounds: Normal heart sounds. No murmur heard. Pulmonary:     Effort: Pulmonary effort is normal. No respiratory distress.     Breath sounds: Normal breath sounds. No wheezing.  Abdominal:     General: Bowel sounds are normal. There is no distension.     Palpations: Abdomen is soft.     Tenderness: There is no abdominal tenderness.  Musculoskeletal:        General: No tenderness. Normal range of motion.     Cervical back: Normal range of motion and neck supple.  Skin:    General: Skin is warm and dry.  Neurological:     Mental Status: She is alert and oriented to person, place, and time.     Cranial Nerves: No cranial nerve deficit.     Deep Tendon Reflexes: Reflexes are normal and symmetric.  Psychiatric:        Behavior: Behavior normal.        Thought Content: Thought content normal.        Judgment: Judgment normal.     BP 110/73   Pulse 79   Temp 97.9 F (36.6 C) (Temporal)   Ht $R'5\' 4"'lZ$  (1.626 m)   Wt 129 lb (58.5 kg)   BMI 22.14 kg/m      Assessment & Plan:  Ruth Hopkins comes in today with chief complaint of Medical Management of Chronic Issues and Urinary Tract Infection   Diagnosis and orders addressed:  1. Dysuria Urine negative Thinks it may from caffeine.  I have offered a referral to Urologists   - Urine Culture - Urinalysis, Complete - CMP14+EGFR - CBC with Differential/Platelet  2. Gastroesophageal reflux disease, unspecified whether esophagitis present - CMP14+EGFR - CBC with Differential/Platelet  3. Acquired hypothyroidism - CMP14+EGFR - CBC with Differential/Platelet  4.  Fibromyalgia - CMP14+EGFR - CBC with Differential/Platelet  5. Anxiety Increased anxiety. She has not been taking certain medications because she is worried about hair loss. - CMP14+EGFR - CBC with Differential/Platelet  6. Depression, recurrent ([place]) - CMP14+EGFR - CBC with Differential/Platelet  7. Smoker Encouraged smoking cessation - CMP14+EGFR - CBC with Differential/Platelet  8. Annual physical exam - CMP14+EGFR - CBC with Differential/Platelet - Lipid panel   Labs pending Health Maintenance reviewed- Has Pap scheduled for this week. Diet and exercise encouraged  Follow up plan: 6 months    Evelina Dun, FNP

## 2021-07-22 LAB — LIPID PANEL
Chol/HDL Ratio: 4.1 ratio (ref 0.0–4.4)
Cholesterol, Total: 240 mg/dL — ABNORMAL HIGH (ref 100–199)
HDL: 58 mg/dL (ref >39)
LDL Chol Calc (NIH): 162 mg/dL — ABNORMAL HIGH (ref 0–99)
Triglycerides: 111 mg/dL (ref 0–149)
VLDL Cholesterol Cal: 20 mg/dL (ref 5–40)

## 2021-07-22 LAB — CMP14+EGFR
ALT: 25 IU/L (ref 0–32)
AST: 25 IU/L (ref 0–40)
Albumin/Globulin Ratio: 1.8 (ref 1.2–2.2)
Albumin: 4.4 g/dL (ref 3.8–4.9)
Alkaline Phosphatase: 67 IU/L (ref 44–121)
BUN/Creatinine Ratio: 43 — ABNORMAL HIGH (ref 9–23)
BUN: 31 mg/dL — ABNORMAL HIGH (ref 6–24)
Bilirubin Total: 0.2 mg/dL (ref 0.0–1.2)
CO2: 25 mmol/L (ref 20–29)
Calcium: 9 mg/dL (ref 8.7–10.2)
Chloride: 104 mmol/L (ref 96–106)
Creatinine, Ser: 0.72 mg/dL (ref 0.57–1.00)
Globulin, Total: 2.4 g/dL (ref 1.5–4.5)
Glucose: 72 mg/dL (ref 70–99)
Potassium: 4 mmol/L (ref 3.5–5.2)
Sodium: 140 mmol/L (ref 134–144)
Total Protein: 6.8 g/dL (ref 6.0–8.5)
eGFR: 98 mL/min/{1.73_m2} (ref >59)

## 2021-07-22 LAB — CBC WITH DIFFERENTIAL/PLATELET
Basophils Absolute: 0 10*3/uL (ref 0.0–0.2)
Basos: 1 %
EOS (ABSOLUTE): 0.3 10*3/uL (ref 0.0–0.4)
Eos: 6 %
Hematocrit: 42 % (ref 34.0–46.6)
Hemoglobin: 13.4 g/dL (ref 11.1–15.9)
Immature Grans (Abs): 0 10*3/uL (ref 0.0–0.1)
Immature Granulocytes: 0 %
Lymphocytes Absolute: 1.6 10*3/uL (ref 0.7–3.1)
Lymphs: 34 %
MCH: 27.9 pg (ref 26.6–33.0)
MCHC: 31.9 g/dL (ref 31.5–35.7)
MCV: 87 fL (ref 79–97)
Monocytes Absolute: 0.3 10*3/uL (ref 0.1–0.9)
Monocytes: 6 %
Neutrophils Absolute: 2.6 10*3/uL (ref 1.4–7.0)
Neutrophils: 53 %
Platelets: 206 10*3/uL (ref 150–450)
RBC: 4.81 x10E6/uL (ref 3.77–5.28)
RDW: 11.7 % (ref 11.7–15.4)
WBC: 4.9 10*3/uL (ref 3.4–10.8)

## 2021-07-23 DIAGNOSIS — Z124 Encounter for screening for malignant neoplasm of cervix: Secondary | ICD-10-CM | POA: Diagnosis not present

## 2021-07-23 DIAGNOSIS — R69 Illness, unspecified: Secondary | ICD-10-CM | POA: Diagnosis not present

## 2021-07-23 DIAGNOSIS — Z6821 Body mass index (BMI) 21.0-21.9, adult: Secondary | ICD-10-CM | POA: Diagnosis not present

## 2021-07-23 DIAGNOSIS — Z01419 Encounter for gynecological examination (general) (routine) without abnormal findings: Secondary | ICD-10-CM | POA: Diagnosis not present

## 2021-07-25 LAB — URINE CULTURE

## 2021-07-28 ENCOUNTER — Ambulatory Visit: Payer: Medicare HMO | Admitting: Dermatology

## 2021-08-04 DIAGNOSIS — F1321 Sedative, hypnotic or anxiolytic dependence, in remission: Secondary | ICD-10-CM | POA: Diagnosis not present

## 2021-08-04 DIAGNOSIS — R69 Illness, unspecified: Secondary | ICD-10-CM | POA: Diagnosis not present

## 2021-08-04 DIAGNOSIS — F330 Major depressive disorder, recurrent, mild: Secondary | ICD-10-CM | POA: Diagnosis not present

## 2021-08-05 ENCOUNTER — Ambulatory Visit: Payer: Medicare HMO | Admitting: Dermatology

## 2021-09-01 DIAGNOSIS — N644 Mastodynia: Secondary | ICD-10-CM | POA: Diagnosis not present

## 2021-09-01 DIAGNOSIS — R922 Inconclusive mammogram: Secondary | ICD-10-CM | POA: Diagnosis not present

## 2021-09-01 DIAGNOSIS — Z86000 Personal history of in-situ neoplasm of breast: Secondary | ICD-10-CM | POA: Diagnosis not present

## 2021-09-01 DIAGNOSIS — Z9189 Other specified personal risk factors, not elsewhere classified: Secondary | ICD-10-CM | POA: Diagnosis not present

## 2021-09-04 ENCOUNTER — Ambulatory Visit (INDEPENDENT_AMBULATORY_CARE_PROVIDER_SITE_OTHER): Payer: Medicare HMO | Admitting: Family Medicine

## 2021-09-04 DIAGNOSIS — J0100 Acute maxillary sinusitis, unspecified: Secondary | ICD-10-CM | POA: Diagnosis not present

## 2021-09-04 MED ORDER — AMOXICILLIN-POT CLAVULANATE 875-125 MG PO TABS: 1.0000 | ORAL_TABLET | Freq: Two times a day (BID) | ORAL | 0 refills | Status: DC

## 2021-09-04 MED ORDER — MECLIZINE HCL 25 MG PO TABS: 25.0000 mg | ORAL_TABLET | Freq: Three times a day (TID) | ORAL | 0 refills | Status: DC | PRN

## 2021-09-04 NOTE — Progress Notes (Addendum)
Subjective:    Patient ID: Ruth Hopkins, female    DOB: 1966-08-11, 55 y.o.   MRN: [id number]   HPI: Ruth Hopkins is a 55 y.o. female presenting for dizziness when first getting up. When she bends over she feels like a bit of vertigo. Has had labyrinthitis in the past. Room goes around. Lasts a few seconds.   Stuffy nose as well. Similar sx with sinus and ear infections. Onset yesterday. Slight facial pressure at cheeks. NO drainage.    Depression screen [facility] 2/9 05/14/2021 04/17/2021 02/18/2021 07/24/2020 03/11/2020  Decreased Interest 0 0 0 0 0  Down, Depressed, Hopeless 0 0 1 1 0  PHQ - 2 Score 0 0 1 1 0  Altered sleeping 1 - - 2 -  Tired, decreased energy 1 - - 1 -  Change in appetite 0 - - 0 -  Feeling bad or failure about yourself  0 - - 0 -  Trouble concentrating 0 - - 0 -  Moving slowly or fidgety/restless 0 - - 0 -  Suicidal thoughts 0 - - 0 -  PHQ-9 Score 2 - - 4 -  Difficult doing work/chores Not difficult at all - - Somewhat difficult -  Some recent data might be hidden     Relevant past medical, surgical, family and social history reviewed and updated as indicated.  Interim medical history since our last visit reviewed. Allergies and medications reviewed and updated.  ROS: 12 Review of Systems  Constitutional:  Negative for activity change, appetite change, chills and fever.  HENT:  Positive for congestion and sinus pressure. Negative for ear discharge, ear pain, hearing loss, nosebleeds, postnasal drip, rhinorrhea, sneezing and trouble swallowing.   Respiratory:  Negative for chest tightness and shortness of breath.   Cardiovascular:  Negative for chest pain and palpitations.  Skin:  Negative for rash.  Psychiatric/Behavioral:  The patient is not nervous/anxious.     Social History   Tobacco Use  Smoking Status Light Smoker   Types: Cigarettes  Smokeless Tobacco Never       Objective:     Wt Readings from Last 3 Encounters:  07/21/21 129 lb (58.5 kg)   07/10/21 128 lb 12.8 oz (58.4 kg)  05/14/21 130 lb 12.8 oz (59.3 kg)     Exam deferred. Pt. Harboring due to COVID 19. Phone visit performed.   Assessment & Plan:   1. Acute maxillary sinusitis, recurrence not specified     Meds ordered this encounter  Medications   amoxicillin-clavulanate (AUGMENTIN) 875-125 MG tablet    Sig: Take 1 tablet by mouth 2 (two) times daily. Take all of this medication    Dispense:  20 tablet    Refill:  0   meclizine (ANTIVERT) 25 MG tablet    Sig: Take 1 tablet (25 mg total) by mouth 3 (three) times daily as needed for dizziness.    Dispense:  30 tablet    Refill:  0    No orders of the defined types were placed in this encounter.     Diagnoses and all orders for this visit:  Acute maxillary sinusitis, recurrence not specified  Other orders -     amoxicillin-clavulanate (AUGMENTIN) 875-125 MG tablet; Take 1 tablet by mouth 2 (two) times daily. Take all of this medication -     meclizine (ANTIVERT) 25 MG tablet; Take 1 tablet (25 mg total) by mouth 3 (three) times daily as needed for dizziness.    Virtual Visit  via telephone Note  I discussed the limitations, risks, security and privacy concerns of performing an evaluation and management service by telephone and the availability of in person appointments. The patient was identified with two identifiers. Pt.expressed understanding and agreed to proceed. Pt. Is at home. Dr. Livia Snellen is in his office.  Follow Up Instructions:   I discussed the assessment and treatment plan with the patient. The patient was provided an opportunity to ask questions and all were answered. The patient agreed with the plan and demonstrated an understanding of the instructions.   The patient was advised to call back or seek an in-person evaluation if the symptoms worsen or if the condition fails to improve as anticipated.   Total minutes including chart review and phone contact time: 12   Follow up plan: No  follow-ups on file.  Claretta Fraise, MD [place]

## 2021-09-07 ENCOUNTER — Telehealth: Payer: Self-pay | Admitting: Family

## 2021-09-07 NOTE — Telephone Encounter (Signed)
Appointment scheduled.

## 2021-09-07 NOTE — Telephone Encounter (Signed)
Pt. Needs to be seen in person. Thanks, WS

## 2021-09-08 ENCOUNTER — Ambulatory Visit (INDEPENDENT_AMBULATORY_CARE_PROVIDER_SITE_OTHER): Payer: Medicare HMO | Admitting: Family Medicine

## 2021-09-08 ENCOUNTER — Encounter: Payer: Self-pay | Admitting: Family Medicine

## 2021-09-08 VITALS — BP 101/64 | HR 75 | Temp 98.3°F | Ht 64.0 in | Wt 129.0 lb

## 2021-09-08 DIAGNOSIS — R6889 Other general symptoms and signs: Secondary | ICD-10-CM | POA: Diagnosis not present

## 2021-09-08 DIAGNOSIS — R110 Nausea: Secondary | ICD-10-CM | POA: Diagnosis not present

## 2021-09-08 DIAGNOSIS — R0981 Nasal congestion: Secondary | ICD-10-CM

## 2021-09-08 DIAGNOSIS — R531 Weakness: Secondary | ICD-10-CM | POA: Diagnosis not present

## 2021-09-08 LAB — MICROSCOPIC EXAMINATION
RBC, Urine: NONE SEEN /hpf (ref 0–2)
Renal Epithel, UA: NONE SEEN /hpf
WBC, UA: NONE SEEN /hpf (ref 0–5)

## 2021-09-08 LAB — URINALYSIS, ROUTINE W REFLEX MICROSCOPIC
Bilirubin, UA: NEGATIVE
Glucose, UA: NEGATIVE
Ketones, UA: NEGATIVE
Leukocytes,UA: NEGATIVE
Nitrite, UA: NEGATIVE
Protein,UA: NEGATIVE
Specific Gravity, UA: 1.015 (ref 1.005–1.030)
Urobilinogen, Ur: 0.2 mg/dL (ref 0.2–1.0)
pH, UA: 7.5 (ref 5.0–7.5)

## 2021-09-08 MED ORDER — ONDANSETRON HCL 4 MG PO TABS: 4.0000 mg | ORAL_TABLET | Freq: Three times a day (TID) | ORAL | 0 refills | Status: DC | PRN

## 2021-09-08 NOTE — Progress Notes (Signed)
Acute Office Visit  Subjective:    Patient ID: Ruth Hopkins, female    DOB: [birth year], 55 y.o.   MRN: [id number]  Chief Complaint  Patient presents with   Nausea   Weakness    HPI Patient is in today for nausea for 6 days. She was having some vertigo symptoms and fatigue as well. She reports dizziness for short periods of time when lying down. She had a visit a few days ago and was given meclizine. She did not take this. She was also given augmentin for sinusitis. She reports some mild nasal congestion. She denies abdominal pain or vomiting. Denies fever, chest pain, or shortness of breath. She denies urinary symptoms but reports that she has fel this way with a UTI before. She is also worried about Covid or the flu since she feels so fatigued and has congestion.   Past Medical History:  Diagnosis Date   Chronic fatigue    Depression    Fibromyalgia    IBS (irritable bowel syndrome)    Migraine    Thyroid disease     Past Surgical History:  Procedure Laterality Date   BREAST SURGERY Right 2007   lumpectomy   FOOT SURGERY      Family History  Problem Relation Age of Onset   Asthma Mother    Hypothyroidism Mother    Hypertension Mother    Raynaud syndrome Mother    Hypertension Father     Social History   Socioeconomic History   Marital status: Married    Spouse name: Not on file   Number of children: Not on file   Years of education: Not on file   Highest education level: Not on file  Occupational History   Not on file  Tobacco Use   Smoking status: Light Smoker    Types: Cigarettes   Smokeless tobacco: Never  Vaping Use   Vaping Use: Never used  Substance and Sexual Activity   Alcohol use: No   Drug use: No   Sexual activity: Not on file  Other Topics Concern   Not on file  Social History Narrative   Lives home with husband   Social Determinants of Health   Financial Resource Strain: Low Risk    Difficulty of Paying Living Expenses: Not hard at  all  Food Insecurity: No Food Insecurity   Worried About Charity fundraiser in the Last Year: Never true   [place] in the Last Year: Never true  Transportation Needs: No Transportation Needs   Lack of Transportation (Medical): No   Lack of Transportation (Non-Medical): No  Physical Activity: Insufficiently Active   Days of Exercise per Week: 3 days   Minutes of Exercise per Session: 40 min  Stress: No Stress Concern Present   Feeling of Stress : Not at all  Social Connections: Moderately Integrated   Frequency of Communication with Friends and Family: More than three times a week   Frequency of Social Gatherings with Friends and Family: Twice a week   Attends Religious Services: More than 4 times per year   Active Member of [employer] or Organizations: No   Attends Archivist Meetings: Never   Marital Status: Married  Human resources officer Violence: Not At Risk   Fear of Current or Ex-Partner: No   Emotionally Abused: No   Physically Abused: No   Sexually Abused: No    Outpatient Medications Prior to Visit  Medication Sig Dispense Refill   acetaminophen (  TYLENOL) 500 MG tablet Take by mouth.     amoxicillin-clavulanate (AUGMENTIN) 875-125 MG tablet Take 1 tablet by mouth 2 (two) times daily. Take all of this medication 20 tablet 0   buPROPion (WELLBUTRIN XL) 150 MG 24 hr tablet Take 1 tablet (150 mg total) by mouth daily. 90 tablet 1   clonazePAM (KLONOPIN) 0.5 MG tablet Take 1 tablet (0.5 mg total) by mouth 2 (two) times daily. 30 tablet 2   cycloSPORINE (RESTASIS) 0.05 % ophthalmic emulsion Restasis 0.05 % eye drops in a dropperette  USE 1 DROP INTO BOTH EYES TWICE A DAY     diclofenac (VOLTAREN) 75 MG EC tablet Take 1 tablet (75 mg total) by mouth 2 (two) times daily. 180 tablet 0   eletriptan (RELPAX) 40 MG tablet May repeat in 2 hours if headache persists or recurs. 36 tablet 1   estradiol (VIVELLE-DOT) 0.075 MG/24HR Place 1 patch onto the skin 2 (two) times a week.      EUTHYROX 50 MCG tablet TAKE 1 TABLET BY MOUTH ONCE DAILY BEFORE BREAKFAST 90 tablet 1   imipramine (TOFRANIL) 10 MG tablet Take 3 tablets (30 mg total) by mouth at bedtime. 90 tablet 11   methocarbamol (ROBAXIN) 500 MG tablet Take 1 tablet (500 mg total) by mouth every 6 (six) hours as needed for muscle spasms. 30 tablet 5   mometasone (NASONEX) 50 MCG/ACT nasal spray Place 2 sprays into the nose daily. 51 g 1   Multiple Vitamins-Minerals (MULTIVITAMIN PO) Take by mouth.     omeprazole (PRILOSEC) 20 MG capsule Take 1 capsule (20 mg total) by mouth daily. 90 capsule 3   meclizine (ANTIVERT) 25 MG tablet Take 1 tablet (25 mg total) by mouth 3 (three) times daily as needed for dizziness. (Patient not taking: Reported on 09/08/2021) 30 tablet 0   topiramate (TOPAMAX) 50 MG tablet Take 1 tablet (50 mg total) by mouth 2 (two) times daily. (Patient not taking: Reported on 09/08/2021) 180 tablet 2   tretinoin (RETIN-A) 0.025 % cream Apply topically at bedtime. (Patient not taking: Reported on 09/08/2021) 45 g 3   diclofenac Sodium (VOLTAREN) 1 % GEL Apply 2 g topically 4 (four) times daily. 50 g 1   hydrocortisone 2.5 % ointment Apply topically 2 (two) times daily. 30 g 0   Olopatadine HCl 0.2 % SOLN Apply 1 drop to eye in the morning and at bedtime. 2.5 mL 2   tacrolimus (PROTOPIC) 0.1 % ointment Apply topically 2 (two) times daily. 100 g 3   No facility-administered medications prior to visit.    Allergies  Allergen Reactions   Latex Itching and Rash   Codeine    Gabapentin     "fuzzy"   Sulfa Antibiotics     Review of Systems As per HPI.     Objective:    Physical Exam Vitals and nursing note reviewed.  Constitutional:      General: She is not in acute distress.    Appearance: She is not ill-appearing, toxic-appearing or diaphoretic.  HENT:     Head: Atraumatic.     Right Ear: Tympanic membrane, ear canal and external ear normal.     Left Ear: Tympanic membrane, ear canal and  external ear normal.     Nose: Congestion present.     Mouth/Throat:     Mouth: Mucous membranes are moist.     Pharynx: Oropharynx is clear. No oropharyngeal exudate or posterior oropharyngeal erythema.  Eyes:     Extraocular Movements:  Extraocular movements intact.     Conjunctiva/sclera: Conjunctivae normal.     Pupils: Pupils are equal, round, and reactive to light.  Cardiovascular:     Rate and Rhythm: Normal rate and regular rhythm.     Heart sounds: Normal heart sounds. No murmur heard. Pulmonary:     Effort: Pulmonary effort is normal. No respiratory distress.     Breath sounds: Normal breath sounds.  Abdominal:     General: Bowel sounds are normal. There is no distension.     Tenderness: There is no abdominal tenderness. There is no right CVA tenderness, left CVA tenderness, guarding or rebound.  Skin:    General: Skin is warm and dry.  Neurological:     General: No focal deficit present.     Mental Status: She is alert and oriented to person, place, and time.  Psychiatric:        Mood and Affect: Mood normal.        Behavior: Behavior normal.    BP 101/64    Pulse 75    Temp 98.3 F (36.8 C) (Temporal)    Ht $R'5\' 4"'fJ$  (1.626 m)    Wt 129 lb (58.5 kg)    BMI 22.14 kg/m  Wt Readings from Last 3 Encounters:  09/08/21 129 lb (58.5 kg)  07/21/21 129 lb (58.5 kg)  07/10/21 128 lb 12.8 oz (58.4 kg)    Health Maintenance Due  Topic Date Due   COVID-19 Vaccine (1) Never done   PAP SMEAR-Modifier  06/15/2021    There are no preventive care reminders to display for this patient.   Lab Results  Component Value Date   TSH 2.130 05/14/2021   Lab Results  Component Value Date   WBC 4.9 07/21/2021   HGB 13.4 07/21/2021   HCT 42.0 07/21/2021   MCV 87 07/21/2021   PLT 206 07/21/2021   Lab Results  Component Value Date   NA 140 07/21/2021   K 4.0 07/21/2021   CO2 25 07/21/2021   GLUCOSE 72 07/21/2021   BUN 31 (H) 07/21/2021   CREATININE 0.72 07/21/2021   BILITOT  0.2 07/21/2021   ALKPHOS 67 07/21/2021   AST 25 07/21/2021   ALT 25 07/21/2021   PROT 6.8 07/21/2021   ALBUMIN 4.4 07/21/2021   CALCIUM 9.0 07/21/2021   EGFR 98 07/21/2021   Lab Results  Component Value Date   CHOL 240 (H) 07/21/2021   Lab Results  Component Value Date   HDL 58 07/21/2021   Lab Results  Component Value Date   LDLCALC 162 (H) 07/21/2021   Lab Results  Component Value Date   TRIG 111 07/21/2021   Lab Results  Component Value Date   CHOLHDL 4.1 07/21/2021   No results found for: HGBA1C     Assessment & Plan:   Kensly was seen today for nausea and weakness.  Diagnoses and all orders for this visit:  Nausea UA negative for UTI. Zofran ordered as below. Discussed to take meclizine as vertigo can also cause fatigue and nausea. Benign exam today. Discussed hydration. Urine culture pending.  -     Urinalysis, Routine w reflex microscopic -     ondansetron (ZOFRAN) 4 MG tablet; Take 1 tablet (4 mg total) by mouth every 8 (eight) hours as needed for nausea or vomiting. -     COVID-19, Flu A+B and RSV -     Urine Culture  Nasal congestion Discussed OTC medication. Continue augmentin as prescribed.  Testing pending as  below, quarantine until results. Will notify patient of results when available.  -     COVID-19, Flu A+B and RSV  Return to office for new or worsening symptoms, or if symptoms persist.   The patient indicates understanding of these issues and agrees with the plan.  Gwenlyn Perking, FNP

## 2021-09-08 NOTE — Patient Instructions (Signed)
Vertigo Vertigo is the feeling that you or your surroundings are moving when they are not. This feeling can come and go at any time. Vertigo often goes away on its own. Vertigo can be dangerous if it occurs while you are doing something thatcould endanger yourself or others, such as driving or operating machinery. Your health care provider will do tests to try to determine the cause of your vertigo. Tests will also help your health care provider decide how best totreat your condition. Follow these instructions at home: Eating and drinking     Dehydration can make vertigo worse. Drink enough fluid to keep your urine pale yellow. Do not drink alcohol. Activity Return to your normal activities as told by your health care provider. Ask your health care provider what activities are safe for you. In the morning, first sit up on the side of the bed. When you feel okay, stand slowly while you hold onto something until you know that your balance is fine. Move slowly. Avoid sudden body or head movements or certain positions, as told by your health care provider. If you have trouble walking or keeping your balance, try using a cane for stability. If you feel dizzy or unstable, sit down right away. Avoid doing any tasks that would cause danger to you or others if vertigo occurs. Avoid bending down if you feel dizzy. Place items in your home so that they are easy for you to reach without bending or leaning over. Do not drive or use machinery if you feel dizzy. General instructions Take over-the-counter and prescription medicines only as told by your health care provider. Keep all follow-up visits. This is important. Contact a health care provider if: Your medicines do not relieve your vertigo or they make it worse. Your condition gets worse or you develop new symptoms. You have a fever. You develop nausea or vomiting, or if nausea gets worse. Your family or friends notice any behavioral changes. You  have numbness or a prickling and tingling sensation in part of your body. Get help right away if you: Are always dizzy or you faint. Develop severe headaches. Develop a stiff neck. Develop sensitivity to light. Have difficulty moving or speaking. Have weakness in your hands, arms, or legs. Have changes in your hearing or vision. These symptoms may represent a serious problem that is an emergency. Do not wait to see if the symptoms will go away. Get medical help right away. Call your local emergency services (911 in the [place] Do not drive yourself to the hospital. Summary Vertigo is the feeling that you or your surroundings are moving when they are not. Your health care provider will do tests to try to determine the cause of your vertigo. Follow instructions for home care. You may be told to avoid certain tasks, positions, or movements. Contact a health care provider if your medicines do not relieve your symptoms, or if you have a fever, nausea, vomiting, or changes in behavior. Get help right away if you have severe headaches or difficulty speaking, or you develop hearing or vision problems. This information is not intended to replace advice given to you by your health care provider. Make sure you discuss any questions you have with your healthcare provider. Document Revised: 08/06/2020 Document Reviewed: 08/06/2020 Elsevier Patient Education  2022 [employer]  

## 2021-09-09 ENCOUNTER — Ambulatory Visit: Payer: Medicare HMO | Admitting: Family Medicine

## 2021-09-09 LAB — COVID-19, FLU A+B AND RSV
Influenza A, NAA: NOT DETECTED
Influenza B, NAA: NOT DETECTED
RSV, NAA: NOT DETECTED
SARS-CoV-2, NAA: NOT DETECTED

## 2021-09-12 LAB — URINE CULTURE: Organism ID, Bacteria: NO GROWTH

## 2021-09-17 DIAGNOSIS — N644 Mastodynia: Secondary | ICD-10-CM | POA: Diagnosis not present

## 2021-09-17 DIAGNOSIS — Z86000 Personal history of in-situ neoplasm of breast: Secondary | ICD-10-CM | POA: Diagnosis not present

## 2021-09-17 DIAGNOSIS — Z9189 Other specified personal risk factors, not elsewhere classified: Secondary | ICD-10-CM | POA: Diagnosis not present

## 2021-09-17 DIAGNOSIS — R922 Inconclusive mammogram: Secondary | ICD-10-CM | POA: Diagnosis not present

## 2021-09-17 LAB — HM MAMMOGRAPHY

## 2021-09-25 ENCOUNTER — Other Ambulatory Visit: Payer: Self-pay | Admitting: Family

## 2021-09-25 DIAGNOSIS — G43901 Migraine, unspecified, not intractable, with status migrainosus: Secondary | ICD-10-CM

## 2021-09-30 ENCOUNTER — Other Ambulatory Visit: Payer: Self-pay | Admitting: Family

## 2021-09-30 DIAGNOSIS — E039 Hypothyroidism, unspecified: Secondary | ICD-10-CM

## 2021-10-02 ENCOUNTER — Other Ambulatory Visit: Payer: Self-pay | Admitting: Family

## 2021-10-02 DIAGNOSIS — M5442 Lumbago with sciatica, left side: Secondary | ICD-10-CM

## 2021-10-05 ENCOUNTER — Encounter: Payer: Self-pay | Admitting: Nurse Practitioner

## 2021-10-05 ENCOUNTER — Ambulatory Visit (INDEPENDENT_AMBULATORY_CARE_PROVIDER_SITE_OTHER): Payer: Medicare HMO | Admitting: Nurse Practitioner

## 2021-10-05 DIAGNOSIS — H9202 Otalgia, left ear: Secondary | ICD-10-CM | POA: Diagnosis not present

## 2021-10-05 MED ORDER — FLUOCINOLONE ACETONIDE 0.01 % OT OIL: 1.0000 [drp] | TOPICAL_OIL | Freq: Every day | OTIC | 0 refills | Status: DC

## 2021-10-05 NOTE — Progress Notes (Signed)
° °  Virtual Visit  Note Due to COVID-19 pandemic this visit was conducted virtually. This visit type was conducted due to national recommendations for restrictions regarding the COVID-19 Pandemic (e.g. social distancing, sheltering in place) in an effort to limit this patient's exposure and mitigate transmission in our community. All issues noted in this document were discussed and addressed.  A physical exam was not performed with this format.  I connected with Ruth Hopkins on 10/05/21 at 8:00 am  by telephone and verified that I am speaking with the correct person using two identifiers. Ruth Hopkins is currently located at home during visit. The provider, Ivy Lynn, NP is located in their office at time of visit.  I discussed the limitations, risks, security and privacy concerns of performing an evaluation and management service by telephone and the availability of in person appointments. I also discussed with the patient that there may be a patient responsible charge related to this service. The patient expressed understanding and agreed to proceed.   History and Present Illness:  Otalgia  There is pain in the right ear. This is a new problem. The current episode started in the past 7 days. The problem occurs constantly. The problem has been unchanged. There has been no fever. The patient is experiencing no pain. Pertinent negatives include no abdominal pain, coughing, diarrhea, headaches or hearing loss. Associated symptoms comments: Dryness/pruritus. Treatments tried: Zyrtec. The treatment provided no relief. Eczema     Review of Systems  Constitutional: Negative.   HENT:  Positive for ear pain. Negative for congestion, hearing loss and sinus pain.   Respiratory:  Negative for cough and stridor.   Cardiovascular: Negative.   Gastrointestinal:  Negative for abdominal pain and diarrhea.  Genitourinary: Negative.   Musculoskeletal: Negative.   Neurological:  Negative for headaches.     Observations/Objective: Televisit patient not in distress.  Assessment and Plan:  Dry Pruritus right ear  Take meds as prescribed - Use a cool mist humidifier  -Use saline nose sprays frequently -Force fluids -For fever or aches or pains- take Tylenol or ibuprofen. -Fluocinolone ear drops for itching and dryness -Continue Zyrtec as prescribed.   Follow Up Instructions: Follow-up with worsening unresolved symptoms.    I discussed the assessment and treatment plan with the patient. The patient was provided an opportunity to ask questions and all were answered. The patient agreed with the plan and demonstrated an understanding of the instructions.   The patient was advised to call back or seek an in-person evaluation if the symptoms worsen or if the condition fails to improve as anticipated.  The above assessment and management plan was discussed with the patient. The patient verbalized understanding of and has agreed to the management plan. Patient is aware to call the clinic if symptoms persist or worsen. Patient is aware when to return to the clinic for a follow-up visit. Patient educated on when it is appropriate to go to the emergency department.   Time call ended: 8:10 AM I provided 10 minutes of  non face-to-face time during this encounter.    Ivy Lynn, NP

## 2021-10-08 ENCOUNTER — Ambulatory Visit (INDEPENDENT_AMBULATORY_CARE_PROVIDER_SITE_OTHER): Payer: Medicare HMO | Admitting: Family

## 2021-10-08 ENCOUNTER — Other Ambulatory Visit: Payer: Self-pay

## 2021-10-08 ENCOUNTER — Encounter: Payer: Self-pay | Admitting: Family

## 2021-10-08 VITALS — BP 99/65 | HR 81 | Temp 98.7°F | Ht 64.0 in | Wt 127.8 lb

## 2021-10-08 DIAGNOSIS — R300 Dysuria: Secondary | ICD-10-CM | POA: Diagnosis not present

## 2021-10-08 DIAGNOSIS — R52 Pain, unspecified: Secondary | ICD-10-CM | POA: Diagnosis not present

## 2021-10-08 DIAGNOSIS — B029 Zoster without complications: Secondary | ICD-10-CM | POA: Diagnosis not present

## 2021-10-08 DIAGNOSIS — H8110 Benign paroxysmal vertigo, unspecified ear: Secondary | ICD-10-CM | POA: Diagnosis not present

## 2021-10-08 LAB — MICROSCOPIC EXAMINATION
Bacteria, UA: NONE SEEN
Epithelial Cells (non renal): NONE SEEN /hpf (ref 0–10)
RBC, Urine: NONE SEEN /hpf (ref 0–2)
Renal Epithel, UA: NONE SEEN /hpf
WBC, UA: NONE SEEN /hpf (ref 0–5)

## 2021-10-08 LAB — URINALYSIS, COMPLETE
Bilirubin, UA: NEGATIVE
Glucose, UA: NEGATIVE
Ketones, UA: NEGATIVE
Leukocytes,UA: NEGATIVE
Nitrite, UA: NEGATIVE
Protein,UA: NEGATIVE
Specific Gravity, UA: 1.015 (ref 1.005–1.030)
Urobilinogen, Ur: 0.2 mg/dL (ref 0.2–1.0)
pH, UA: 8 — ABNORMAL HIGH (ref 5.0–7.5)

## 2021-10-08 MED ORDER — VALACYCLOVIR HCL 1 G PO TABS: 1000.0000 mg | ORAL_TABLET | Freq: Three times a day (TID) | ORAL | 0 refills | Status: AC

## 2021-10-08 MED ORDER — MECLIZINE HCL 50 MG PO TABS: 50.0000 mg | ORAL_TABLET | Freq: Three times a day (TID) | ORAL | 1 refills | Status: DC | PRN

## 2021-10-08 MED ORDER — VALACYCLOVIR HCL 1 G PO TABS: 1000.0000 mg | ORAL_TABLET | Freq: Three times a day (TID) | ORAL | 0 refills | Status: DC

## 2021-10-08 NOTE — Progress Notes (Signed)
Subjective:    Patient ID: Ruth Hopkins, female    DOB: [birth year], 56 y.o.   MRN: [id number]  Chief Complaint  Patient presents with   Herpes Zoster   Urinary Tract Infection   Pt presents to the office today with dysuria and burning on her side.  She reports she woke up this morning with burning and tingling pain of her left side.  She reports her pain is 7 out 10. This comes and goes. Denies any rash.  Dysuria  This is a recurrent problem. The current episode started 1 to 4 weeks ago. The problem occurs intermittently. The quality of the pain is described as burning. The pain is mild. Associated symptoms include urgency. Pertinent negatives include no flank pain, frequency, hematuria, hesitancy or nausea. She has tried increased fluids for the symptoms. The treatment provided mild relief.  Dizziness This is a recurrent problem. The current episode started 1 to 4 weeks ago. The problem has been waxing and waning. Pertinent negatives include no nausea. The symptoms are aggravated by bending (laying down). She has tried position changes for the symptoms. The treatment provided mild relief.     Review of Systems  Gastrointestinal:  Negative for nausea.  Genitourinary:  Positive for dysuria and urgency. Negative for flank pain, frequency, hematuria and hesitancy.  Neurological:  Positive for dizziness.  All other systems reviewed and are negative.     Objective:   Physical Exam Vitals reviewed.  Constitutional:      General: She is not in acute distress.    Appearance: She is well-developed.  HENT:     Head: Normocephalic and atraumatic.     Right Ear: Tympanic membrane normal.     Left Ear: Tympanic membrane normal.  Eyes:     Pupils: Pupils are equal, round, and reactive to light.  Neck:     Thyroid: No thyromegaly.  Cardiovascular:     Rate and Rhythm: Normal rate and regular rhythm.     Heart sounds: Normal heart sounds. No murmur heard. Pulmonary:     Effort: Pulmonary  effort is normal. No respiratory distress.     Breath sounds: Normal breath sounds. No wheezing.  Abdominal:     General: Bowel sounds are normal. There is no distension.     Palpations: Abdomen is soft.     Tenderness: There is no abdominal tenderness.  Musculoskeletal:        General: No tenderness. Normal range of motion.     Cervical back: Normal range of motion and neck supple.  Skin:    General: Skin is warm and dry.  Neurological:     Mental Status: She is alert and oriented to person, place, and time.     Cranial Nerves: No cranial nerve deficit.     Deep Tendon Reflexes: Reflexes are normal and symmetric.  Psychiatric:        Behavior: Behavior normal.        Thought Content: Thought content normal.        Judgment: Judgment normal.      BP 99/65    Pulse 81    Temp 98.7 F (37.1 C) (Temporal)    Ht 5\' 4"  (1.626 m)    Wt 127 lb 12.8 oz (58 kg)    BMI 21.94 kg/m      Assessment & Plan:  Ruth Hopkins comes in today with chief complaint of Herpes Zoster and Urinary Tract Infection   Diagnosis and orders addressed:  1.  Dysuria Referral to Urologists pending - Urinalysis, Complete - Urine Culture - Ambulatory referral to Urology  2. Benign paroxysmal positional vertigo, unspecified laterality Rest Avoid fast position changes  Epley exercises  - meclizine (ANTIVERT) 50 MG tablet; Take 1 tablet (50 mg total) by mouth 3 (three) times daily as needed.  Dispense: 90 tablet; Refill: 1  3. Burning pain - valACYclovir (VALTREX) 1000 MG tablet; Take 1 tablet (1,000 mg total) by mouth 3 (three) times daily for 7 days.  Dispense: 21 tablet; Refill: 0  4. Herpes zoster without complication Will start valtrex  Avoid immune compromise Do not scratch - valACYclovir (VALTREX) 1000 MG tablet; Take 1 tablet (1,000 mg total) by mouth 3 (three) times daily for 7 days.  Dispense: 21 tablet; Refill: 0   Labs pending Health Maintenance reviewed Diet and exercise  encouraged  Follow up plan: Keep chronic follow up  Evelina Dun, FNP

## 2021-10-08 NOTE — Patient Instructions (Signed)
Shingles ?Shingles, which is also known as herpes zoster, is an infection that causes a painful skin rash and fluid-filled blisters. It is caused by a virus. ?Shingles only develops in people who: ?Have had chickenpox. ?Have been vaccinated against chickenpox. Shingles is rare in this group. ?What are the causes? ?Shingles is caused by varicella-zoster virus. This is the same virus that causes chickenpox. After a person is exposed to the virus, it stays in the body in an inactive (dormant) state. Shingles develops if the virus is reactivated. This can happen many years after the first (initial) exposure to the virus. It is not known what causes this virus to be reactivated. ?What increases the risk? ?People who have had chickenpox or received the chickenpox vaccine are at risk for shingles. Shingles infection is more common in people who: ?Are older than 57 years of age. ?Have a weakened disease-fighting system (immune system), such as people with: ?HIV (human immunodeficiency virus). ?AIDS (acquired immunodeficiency syndrome). ?Cancer. ?Are taking medicines that weaken the immune system, such as organ transplant medicines. ?Are experiencing a lot of stress. ?What are the signs or symptoms? ?Early symptoms of this condition include itching, tingling, and pain in an area on your skin. Pain may be described as burning, stabbing, or throbbing. ?A few days or weeks after early symptoms start, a painful red rash appears. The rash is usually on one side of the body and has a band-like or belt-like pattern. The rash eventually turns into fluid-filled blisters that break open, change into scabs, and dry up in about 2-3 weeks. ?At any time during the infection, you may also develop: ?A fever. ?Chills. ?A headache. ?Nausea. ?How is this diagnosed? ?This condition is diagnosed with a skin exam. Skin or fluid samples (a culture) may be taken from the blisters before a diagnosis is made. ?How is this treated? ?The rash may last  for several weeks. There is not a specific cure for this condition. Your health care provider may prescribe medicines to help you manage pain, recover more quickly, and avoid long-term problems. Medicines may include: ?Antiviral medicines. ?Anti-inflammatory medicines. ?Pain medicines. ?Anti-itching medicines (antihistamines). ?If the area involved is on your face, you may be referred to a specialist, such as an eye doctor (ophthalmologist) or an ear, nose, and throat (ENT) doctor (otorhinolaryngologist) to help you avoid eye problems, chronic pain, or disability. ?Follow these instructions at home: ?Medicines ?Take over-the-counter and prescription medicines only as told by your health care provider. ?Apply an anti-itch cream or numbing cream to the affected area as told by your health care provider. ?Relieving itching and discomfort ? ?Apply cold, wet cloths (cold compresses) to the area of the rash or blisters as told by your health care provider. ?Cool baths can be soothing. Try adding baking soda or dry oatmeal to the water to reduce itching. Do not bathe in hot water. ?Use calamine lotion as recommended by your health care provider. This is an over-the-counter lotion that helps to relieve itchiness. ?Blister and rash care ?Keep your rash covered with a loose bandage (dressing). Wear loose-fitting clothing to help ease the pain of material rubbing against the rash. ?Wash your hands with soap and water for at least 20 seconds before and after you change your dressing. If soap and water are not available, use hand sanitizer. ?Change your dressing as told by your health care provider. ?Keep your rash and blisters clean by washing the area with mild soap and cool water as told by your health   care provider. ?Check your rash every day for signs of infection. Check for: ?More redness, swelling, or pain. ?Fluid or blood. ?Warmth. ?Pus or a bad smell. ?Do not scratch your rash or pick at your blisters. To help avoid  scratching: ?Keep your fingernails clean and cut short. ?Wear gloves or mittens while you sleep, if scratching is a problem. ?General instructions ?Rest as told by your health care provider. ?Wash your hands often with soap and water for at least 20 seconds. If soap and water are not available, use hand sanitizer. Doing this lowers your chance of getting a bacterial skin infection. ?Before your blisters change into scabs, your shingles infection can cause chickenpox in people who have never had it or have never been vaccinated against it. To prevent this from happening, avoid contact with other people, especially: ?Babies. ?Pregnant women. ?Children who have eczema. ?Older people who have transplants. ?People who have chronic illnesses, such as cancer or AIDS. ?Keep all follow-up visits. This is important. ?How is this prevented? ?Getting vaccinated is the best way to prevent shingles and protect against shingles complications. If you have not been vaccinated, talk with your health care provider about getting the vaccine. ?Where to find more information ?Centers for Disease Control and Prevention: www.cdc.gov ?Contact a health care provider if: ?Your pain is not relieved with prescribed medicines. ?Your pain does not get better after the rash heals. ?You have any of these signs of infection: ?More redness, swelling, or pain around the rash. ?Fluid or blood coming from the rash. ?Warmth coming from your rash. ?Pus or a bad smell coming from the rash. ?A fever. ?Get help right away if: ?The rash is on your face or nose. ?You have facial pain, pain around your eye area, or loss of feeling on one side of your face. ?You have difficulty seeing. ?You have ear pain or have ringing in your ear. ?You have a loss of taste. ?Your condition gets worse. ?Summary ?Shingles, also known as herpes zoster, is an infection that causes a painful skin rash and fluid-filled blisters. ?This condition is diagnosed with a skin exam. Skin or  fluid samples (a culture) may be taken from the blisters. ?Keep your rash covered with a loose bandage (dressing). Wear loose-fitting clothing to help ease the pain of material rubbing against the rash. ?Before your blisters change into scabs, your shingles infection can cause chickenpox in people who have never had it or have never been vaccinated against it. ?This information is not intended to replace advice given to you by your health care provider. Make sure you discuss any questions you have with your health care provider. ?Document Revised: 09/01/2020 Document Reviewed: 09/01/2020 ?Elsevier Patient Education ? 2022 [employer] Inc. ? ?

## 2021-10-11 LAB — URINE CULTURE

## 2021-10-19 ENCOUNTER — Encounter: Payer: Self-pay | Admitting: *Deleted

## 2021-10-20 ENCOUNTER — Ambulatory Visit (INDEPENDENT_AMBULATORY_CARE_PROVIDER_SITE_OTHER): Payer: Medicare HMO | Admitting: Family

## 2021-10-20 ENCOUNTER — Other Ambulatory Visit: Payer: Self-pay | Admitting: Family

## 2021-10-20 ENCOUNTER — Encounter: Payer: Self-pay | Admitting: Family

## 2021-10-20 VITALS — BP 104/64 | HR 79 | Temp 98.5°F | Ht 64.0 in | Wt 130.8 lb

## 2021-10-20 DIAGNOSIS — L600 Ingrowing nail: Secondary | ICD-10-CM | POA: Diagnosis not present

## 2021-10-20 DIAGNOSIS — M797 Fibromyalgia: Secondary | ICD-10-CM

## 2021-10-20 DIAGNOSIS — M5441 Lumbago with sciatica, right side: Secondary | ICD-10-CM | POA: Diagnosis not present

## 2021-10-20 MED ORDER — HYDROCODONE-ACETAMINOPHEN 5-325 MG PO TABS: 1.0000 | ORAL_TABLET | Freq: Four times a day (QID) | ORAL | 0 refills | Status: DC | PRN

## 2021-10-20 MED ORDER — METHOCARBAMOL 500 MG PO TABS: 500.0000 mg | ORAL_TABLET | Freq: Four times a day (QID) | ORAL | 5 refills | Status: DC | PRN

## 2021-10-20 NOTE — Patient Instructions (Signed)
Ingrown Toenail ?An ingrown toenail occurs when the corner or sides of a toenail grow into the surrounding skin. This causes discomfort and pain. The big toe is most commonly affected, but any of the toes can be affected. If an ingrown toenail is not treated, it can become infected. ?What are the causes? ?This condition may be caused by: ?Wearing shoes that are too small or tight. ?An injury, such as stubbing your toe or having your toe stepped on. ?Improper cutting or care of your toenails. ?Having nail or foot abnormalities that were present from birth (congenital abnormalities), such as having a nail that is too big for your toe. ?What increases the risk? ?The following factors may make you more likely to develop ingrown toenails: ?Age. Nails tend to get thicker with age, so ingrown nails are more common among older people. ?Cutting your toenails incorrectly, such as cutting them very short or cutting them unevenly. ?An ingrown toenail is more likely to get infected if you have: ?Diabetes. ?Blood flow (circulation) problems. ?What are the signs or symptoms? ?Symptoms of an ingrown toenail may include: ?Pain, soreness, or tenderness. ?Redness. ?Swelling. ?Hardening of the skin that surrounds the toenail. ?Signs that an ingrown toenail may be infected include: ?Fluid or pus. ?Symptoms that get worse. ?How is this diagnosed? ?Ingrown toenails may be diagnosed based on: ?Your symptoms and medical history. ?A physical exam. ?Labs or tests. If you have fluid or blood coming from your toenail, a sample may be collected to test for the specific type of bacteria that is causing the infection. ?How is this treated? ?Treatment depends on the severity of your symptoms. You may be able to care for your toenail at home. ?If you have an infection, you may be prescribed antibiotic medicines. ?If you have fluid or pus draining from your toenail, your health care provider may drain it. ?If you have trouble walking, you may be  given crutches to use. ?If you have a severe or infected ingrown toenail, you may need a procedure to remove part or all of the nail. ?Follow these instructions at home: ?Foot care ? ?Check your wound every day for signs of infection, or as often as told by your health care provider. Check for: ?More redness, swelling, or pain. ?More fluid or blood. ?Warmth. ?Pus or a bad smell. ?Do not pick at your toenail or try to remove it yourself. ?Soak your foot in warm, soapy water. Do this for 20 minutes, 3 times a day, or as often as told by your health care provider. This helps to keep your toe clean and your skin soft. ?Wear shoes that fit well and are not too tight. Your health care provider may recommend that you wear open-toed shoes while you heal. ?Trim your toenails regularly and carefully. Cut your toenails straight across to prevent injury to the skin at the corners of the toenail. Do not cut your nails in a curved shape. ?Keep your feet clean and dry to help prevent infection. ?General instructions ?Take over-the-counter and prescription medicines only as told by your health care provider. ?If you were prescribed an antibiotic, take it as told by your health care provider. Do not stop taking the antibiotic even if you start to feel better. ?If your health care provider told you to use crutches to help you move around, use them as instructed. ?Return to your normal activities as told by your health care provider. Ask your health care provider what activities are safe for you. ?Keep   all follow-up visits. This is important. ?Contact a health care provider if: ?You have more redness, swelling, pain, or other symptoms that do not improve with treatment. ?You have fluid, blood, or pus coming from your toenail. ?You have a red streak on your skin that starts at your foot and spreads up your leg. ?You have a fever. ?Summary ?An ingrown toenail occurs when the corner or sides of a toenail grow into the surrounding skin.  This causes discomfort and pain. The big toe is most commonly affected, but any of the toes can be affected. ?If an ingrown toenail is not treated, it can become infected. ?Fluid or pus draining from your toenail is a sign of infection. Your health care provider may need to drain it. You may be given antibiotics to treat the infection. ?Trimming your toenails regularly and properly can help you prevent an ingrown toenail. ?This information is not intended to replace advice given to you by your health care provider. Make sure you discuss any questions you have with your health care provider. ?Document Revised: 01/06/2021 Document Reviewed: 01/06/2021 ?Elsevier Patient Education ? 2022 Elsevier Inc. ? ?

## 2021-10-20 NOTE — Progress Notes (Signed)
Subjective:    Patient ID: Ruth Hopkins, female    DOB: [birth year], 56 y.o.   MRN: [id number]  Chief Complaint  Patient presents with   Toe Pain    Left foot big toe    PT presents to the office today with left great toe pain from an ingrown toenail. She states she has had this cut out in the past and it caused her a great deal of pain.   She states she has had a as needed script of Norco that she takes when she has flare up of pain of fibromyalgia, sciatic, ect. She has not had her Norco refilled since 09/17/20 and takes very rarely.  Toe Pain   Back Pain This is a recurrent problem. The current episode started more than 1 year ago. The problem occurs intermittently. The problem has been waxing and waning since onset. The pain is present in the lumbar spine. The quality of the pain is described as aching. The pain radiates to the right thigh. The pain is at a severity of 8/10 (when it hurts. 0 right now). She has tried bed rest, ice and muscle relaxant for the symptoms. The treatment provided mild relief.     Review of Systems  Musculoskeletal:  Positive for back pain.  All other systems reviewed and are negative.     Objective:   Physical Exam Vitals reviewed.  Constitutional:      General: She is not in acute distress.    Appearance: She is well-developed.  HENT:     Head: Normocephalic and atraumatic.     Right Ear: Tympanic membrane normal.     Left Ear: Tympanic membrane normal.  Eyes:     Pupils: Pupils are equal, round, and reactive to light.  Neck:     Thyroid: No thyromegaly.  Cardiovascular:     Rate and Rhythm: Normal rate and regular rhythm.     Heart sounds: Normal heart sounds. No murmur heard. Pulmonary:     Effort: Pulmonary effort is normal. No respiratory distress.     Breath sounds: Normal breath sounds. No wheezing.  Abdominal:     General: Bowel sounds are normal. There is no distension.     Palpations: Abdomen is soft.     Tenderness: There is no  abdominal tenderness.  Musculoskeletal:        General: No tenderness. Normal range of motion.     Cervical back: Normal range of motion and neck supple.  Skin:    General: Skin is warm and dry.  Neurological:     Mental Status: She is alert and oriented to person, place, and time.     Cranial Nerves: No cranial nerve deficit.     Deep Tendon Reflexes: Reflexes are normal and symmetric.  Psychiatric:        Behavior: Behavior normal.        Thought Content: Thought content normal.        Judgment: Judgment normal.    BP 104/64    Pulse 79    Temp 98.5 F (36.9 C) (Temporal)    Ht 5\' 4"  (1.626 m)    Wt 130 lb 12.8 oz (59.3 kg)    BMI 22.45 kg/m        Assessment & Plan:  Ruth Hopkins comes in today with chief complaint of Toe Pain (Left foot big toe )   Diagnosis and orders addressed:  1. Acute right-sided low back pain with right-sided sciatica Rest ROM  exercises  Strict guidelines with Norco discussed. Do not take with Klonopin.  - methocarbamol (ROBAXIN) 500 MG tablet; Take 1 tablet (500 mg total) by mouth every 6 (six) hours as needed for muscle spasms.  Dispense: 30 tablet; Refill: 5 - HYDROcodone-acetaminophen (NORCO) 5-325 MG tablet; Take 1 tablet by mouth every 6 (six) hours as needed for moderate pain.  Dispense: 10 tablet; Refill: 0  2. Fibromyalgia  3. Ingrown toenail   Health Maintenance reviewed Diet and exercise encouraged  Follow up plan: As needed    Evelina Dun, FNP

## 2021-10-21 NOTE — Telephone Encounter (Signed)
[facility] covering provider... These were refilled yesterday but will not let me refuse them.  Can you do this please.

## 2021-10-23 ENCOUNTER — Other Ambulatory Visit: Payer: Self-pay | Admitting: Family

## 2021-10-23 DIAGNOSIS — K219 Gastro-esophageal reflux disease without esophagitis: Secondary | ICD-10-CM

## 2021-11-12 ENCOUNTER — Other Ambulatory Visit: Payer: Self-pay

## 2021-11-12 ENCOUNTER — Ambulatory Visit: Payer: Medicare HMO | Admitting: Podiatry

## 2021-11-12 DIAGNOSIS — M7752 Other enthesopathy of left foot: Secondary | ICD-10-CM | POA: Diagnosis not present

## 2021-11-12 DIAGNOSIS — M7751 Other enthesopathy of right foot: Secondary | ICD-10-CM

## 2021-11-12 MED ORDER — TRIAMCINOLONE ACETONIDE 10 MG/ML IJ SUSP
20.0000 mg | Freq: Once | INTRAMUSCULAR | Status: AC
  Administered 2021-11-12: 20 mg

## 2021-11-13 NOTE — Progress Notes (Signed)
Subjective:   Patient ID: Ruth Hopkins, female   DOB: 56 y.o.   MRN: [id number]   HPI Patient presents stating her ankles are started to hurt again and she has been doing pretty well but trying to increase exercise   ROS      Objective:  Physical Exam  Neurovascular status intact with discomfort in the sinus tarsi bilateral with inflammation noted within the sinus tarsi bilateral and pain     Assessment:  Acute sinus tarsitis with inflammation of the joint surfaces bilateral     Plan:  H&P reviewed condition and I am satisfied that its been almost 1 year.  I did sterile prep injected the sinus tarsi bilateral 3 mg Kenalog 5 mg Xylocaine and advised on anti-inflammatories and support.  Patient will be seen back for [place] to recheck encouraged to call questions that may arise

## 2021-11-16 ENCOUNTER — Ambulatory Visit: Payer: Medicare HMO | Admitting: Dermatology

## 2021-11-16 ENCOUNTER — Other Ambulatory Visit: Payer: Self-pay

## 2021-11-16 DIAGNOSIS — L658 Other specified nonscarring hair loss: Secondary | ICD-10-CM | POA: Diagnosis not present

## 2021-11-16 DIAGNOSIS — L659 Nonscarring hair loss, unspecified: Secondary | ICD-10-CM

## 2021-11-16 DIAGNOSIS — L570 Actinic keratosis: Secondary | ICD-10-CM

## 2021-11-16 DIAGNOSIS — L639 Alopecia areata, unspecified: Secondary | ICD-10-CM | POA: Diagnosis not present

## 2021-11-16 MED ORDER — CLOBETASOL PROPIONATE 0.05 % EX FOAM: Freq: Two times a day (BID) | CUTANEOUS | 3 refills | Status: DC

## 2021-11-18 DIAGNOSIS — R69 Illness, unspecified: Secondary | ICD-10-CM | POA: Diagnosis not present

## 2021-11-18 DIAGNOSIS — F411 Generalized anxiety disorder: Secondary | ICD-10-CM | POA: Diagnosis not present

## 2021-11-18 DIAGNOSIS — F330 Major depressive disorder, recurrent, mild: Secondary | ICD-10-CM | POA: Diagnosis not present

## 2021-11-26 ENCOUNTER — Encounter: Payer: Self-pay | Admitting: Dermatology

## 2021-11-26 NOTE — Progress Notes (Signed)
° °  Follow-Up Visit   Subjective  Ruth Hopkins is a 56 y.o. female who presents for the following: Skin Problem (Lesion on chest x years- itching. ).  Lesion on chest, hair loss. Location:  Duration:  Quality:  Associated Signs/Symptoms: Modifying Factors:  Severity:  Timing: Context:   Objective  Well appearing patient in no apparent distress; mood and affect are within normal limits. 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  Mid Frontal Scalp In addition to a focal area of probable resolving alopecia areata, there is some loss of density on the front and frontal central part.  Right Parietal Scalp 1.5 cm area with loss of hair density, some central regrowth and margins are well anchored with negative pull test.    A focused examination was performed including head, neck, chest. Relevant physical exam findings are noted in the Assessment and Plan.   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[facility])  If this fails to clear with freezing she will return for possible biopsy  Destruction of lesion - Chest - Medial (Center) Complexity: simple   Destruction method: cryotherapy   Informed consent: discussed and consent obtained   Timeout:  patient name, date of birth, surgical site, and procedure verified Lesion destroyed using liquid nitrogen: Yes   Cryotherapy cycles:  3 Outcome: patient tolerated procedure well with no complications   Post-procedure details: wound care instructions given    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  Also discussed the possibility of future hormone intervention (she has a history of breast cancer) as well as low-dose oral minoxidil.  Alopecia areata Right Parietal Scalp  May use topical clobetasol but patient told that up to 90% of of alopecia will eventually correct itself.  Discussed newer Jak inhibitors  but this is clearly not indicated.  clobetasol (OLUX) 0.05 % topical foam - Right Parietal Scalp Apply topically 2 (two) times daily.      I, Lavonna Monarch, MD, have reviewed all documentation for this visit.  The documentation on 11/26/21 for the exam, diagnosis, procedures, and orders are all accurate and complete.

## 2021-12-10 ENCOUNTER — Encounter: Payer: Self-pay | Admitting: Nurse Practitioner

## 2021-12-10 ENCOUNTER — Ambulatory Visit (INDEPENDENT_AMBULATORY_CARE_PROVIDER_SITE_OTHER): Payer: Medicare HMO | Admitting: Nurse Practitioner

## 2021-12-10 ENCOUNTER — Other Ambulatory Visit: Payer: Self-pay | Admitting: Family

## 2021-12-10 DIAGNOSIS — R399 Unspecified symptoms and signs involving the genitourinary system: Secondary | ICD-10-CM | POA: Diagnosis not present

## 2021-12-10 DIAGNOSIS — E039 Hypothyroidism, unspecified: Secondary | ICD-10-CM

## 2021-12-10 LAB — URINALYSIS, COMPLETE
Bilirubin, UA: NEGATIVE
Glucose, UA: NEGATIVE
Leukocytes,UA: NEGATIVE
Nitrite, UA: NEGATIVE
Protein,UA: NEGATIVE
Specific Gravity, UA: 1.025 (ref 1.005–1.030)
Urobilinogen, Ur: 0.2 mg/dL (ref 0.2–1.0)
pH, UA: 6 (ref 5.0–7.5)

## 2021-12-10 LAB — MICROSCOPIC EXAMINATION
Bacteria, UA: NONE SEEN
WBC, UA: NONE SEEN /hpf (ref 0–5)

## 2021-12-10 MED ORDER — CEPHALEXIN 500 MG PO CAPS: 500.0000 mg | ORAL_CAPSULE | Freq: Two times a day (BID) | ORAL | 0 refills | Status: DC

## 2021-12-10 NOTE — Patient Instructions (Signed)
Take medication as prescribe Cotton underwear Take shower not bath Cranberry juice, yogurt Force fluids AZO over the counter X2 days Culture pending RTO prn  

## 2021-12-10 NOTE — Progress Notes (Signed)
? ?  Virtual Visit  Note ?Due to COVID-19 pandemic this visit was conducted virtually. This visit type was conducted due to national recommendations for restrictions regarding the COVID-19 Pandemic (e.g. social distancing, sheltering in place) in an effort to limit this patient's exposure and mitigate transmission in our community. All issues noted in this document were discussed and addressed.  A physical exam was not performed with this format. ? ?I connected with Ruth Hopkins on 12/10/21 at 1:10 by telephone and verified that I am speaking with the correct person using two identifiers. Ruth Hopkins is currently located at home and no one is currently with her during visit. The provider, Mary-Margaret Hassell Done, FNP is located in their office at time of visit. ? ?I discussed the limitations, risks, security and privacy concerns of performing an evaluation and management service by telephone and the availability of in person appointments. I also discussed with the patient that there may be a patient responsible charge related to this service. The patient expressed understanding and agreed to proceed. ? ? ?History and Present Illness: ? ?Urinary Tract Infection  ?This is a new problem. Episode onset: 3 days ago. The problem occurs every urination. The problem has been waxing and waning. The quality of the pain is described as burning. The pain is at a severity of 3/10. The pain is mild. There has been no fever. She is Sexually active. There is No history of pyelonephritis. Associated symptoms include flank pain, frequency and urgency. She has tried nothing for the symptoms. The treatment provided no relief.  ? ? ? ?Review of Systems  ?Genitourinary:  Positive for flank pain, frequency and urgency.  ? ? ?Observations/Objective: ?Alert and oriented- answers all questions appropriately ?No distress ?Denies suprapubic pain or pressure ? ?Assessment and Plan: ?Ruth Hopkins in today with chief complaint of No chief complaint  on file. ? ? ?1. UTI symptoms ?Take medication as prescribe ?Cotton underwear ?Take shower not bath ?Cranberry juice, yogurt ?Force fluids ?AZO over the counter X2 days ?Urine culture pending ?RTO prn ? ?Meds ordered this encounter  ?Medications  ? cephALEXin (KEFLEX) 500 MG capsule  ?  Sig: Take 1 capsule (500 mg total) by mouth 2 (two) times daily.  ?  Dispense:  14 capsule  ?  Refill:  0  ?  Order Specific Question:   Supervising Provider  ?  Answer:   Caryl Pina A [[id number]]  ? ? ? ? ? ?Follow Up Instructions: ?prn ? ?  ?I discussed the assessment and treatment plan with the patient. The patient was provided an opportunity to ask questions and all were answered. The patient agreed with the plan and demonstrated an understanding of the instructions. ?  ?The patient was advised to call back or seek an in-person evaluation if the symptoms worsen or if the condition fails to improve as anticipated. ? ?The above assessment and management plan was discussed with the patient. The patient verbalized understanding of and has agreed to the management plan. Patient is aware to call the clinic if symptoms persist or worsen. Patient is aware when to return to the clinic for a follow-up visit. Patient educated on when it is appropriate to go to the emergency department.  ? ?Time call ended:  1:22 ? ?I provided 12 minutes of  non face-to-face time during this encounter. ? ? ? ?Mary-Margaret Hassell Done, FNP ? ? ?

## 2021-12-11 ENCOUNTER — Telehealth: Payer: Self-pay | Admitting: Family

## 2021-12-11 NOTE — Telephone Encounter (Signed)
Seen in MMM ?

## 2021-12-11 NOTE — Telephone Encounter (Signed)
Patient aware culture not back. ?

## 2021-12-11 NOTE — Telephone Encounter (Signed)
Calling for the results of her urinalysis. Please call back.  ?

## 2021-12-13 LAB — URINE CULTURE

## 2021-12-14 NOTE — Telephone Encounter (Signed)
When culture is reviewed and if abx needs to be changed, send abx to [place]. ?

## 2021-12-14 NOTE — Telephone Encounter (Signed)
LMTCb culture was normal  ?

## 2021-12-21 ENCOUNTER — Other Ambulatory Visit: Payer: Self-pay | Admitting: Family

## 2021-12-21 DIAGNOSIS — M5442 Lumbago with sciatica, left side: Secondary | ICD-10-CM

## 2021-12-23 ENCOUNTER — Ambulatory Visit: Payer: Medicare HMO

## 2021-12-23 DIAGNOSIS — M779 Enthesopathy, unspecified: Secondary | ICD-10-CM

## 2021-12-23 DIAGNOSIS — N941 Unspecified dyspareunia: Secondary | ICD-10-CM | POA: Diagnosis not present

## 2021-12-23 DIAGNOSIS — N760 Acute vaginitis: Secondary | ICD-10-CM | POA: Diagnosis not present

## 2021-12-23 DIAGNOSIS — M2042 Other hammer toe(s) (acquired), left foot: Secondary | ICD-10-CM

## 2021-12-23 NOTE — Progress Notes (Signed)
SITUATION ?Reason for Consult: Follow-up with custom foot orthotics ?Patient / Caregiver Report: Patient has multiple pairs of foot orthotics and wants to know if they are ok or if they need refurbishment ? ?OBJECTIVE DATA ?History / Diagnosis:  ?  ICD-10-CM   ?1. Capsulitis  M77.9   ?  ?2. Hammer toe of left foot  M20.42   ?  ? ? ?Change in Pathology: None ? ?ACTIONS PERFORMED ?Patient's equipment was checked for structural stability and fit. Device(s) intact and fit is excellent. Instructed patient to increase wear schedule as if they were new. All questions answered and concerns addressed. ? ?PLAN ?Follow-up as needed (PRN). Plan of care discussed with and agreed upon by patient / caregiver. ? ?

## 2021-12-30 DIAGNOSIS — H5200 Hypermetropia, unspecified eye: Secondary | ICD-10-CM | POA: Diagnosis not present

## 2021-12-31 ENCOUNTER — Encounter: Payer: Self-pay | Admitting: Family Medicine

## 2021-12-31 ENCOUNTER — Ambulatory Visit (INDEPENDENT_AMBULATORY_CARE_PROVIDER_SITE_OTHER): Payer: Medicare HMO | Admitting: Family Medicine

## 2021-12-31 DIAGNOSIS — M5432 Sciatica, left side: Secondary | ICD-10-CM | POA: Diagnosis not present

## 2021-12-31 DIAGNOSIS — M5431 Sciatica, right side: Secondary | ICD-10-CM

## 2021-12-31 MED ORDER — PREDNISONE 10 MG (21) PO TBPK: ORAL_TABLET | ORAL | 0 refills | Status: DC

## 2021-12-31 NOTE — Progress Notes (Signed)
? ?Virtual Visit via Telephone Note ? ?I connected with Ruth Hopkins on 12/31/21 at 4:42 PM by telephone and verified that I am speaking with the correct person using two identifiers. [place] and nobody is currently with her during this visit. The provider, Loman Brooklyn, FNP is located in their office at time of visit. ? ?I discussed the limitations, risks, security and privacy concerns of performing an evaluation and management service by telephone and the availability of in person appointments. I also discussed with the patient that there may be a patient responsible charge related to this service. The patient expressed understanding and agreed to proceed. ? ?Subjective: ?PCP: Sharion Balloon, FNP ? ?Chief Complaint  ?Patient presents with  ? Sciatica  ? ?Patient reports every time she bends over and raises back up she has pain in both of her hips.  She is also having pain in her lower back when sitting.  She states this is how she feels when her sciatic nerve is starting to act up.  States if she does not take oral steroids it will get worse and she will end up needing a steroid shot with her specialist. ? ? ?ROS: Per HPI ? ?Current Outpatient Medications:  ?  acetaminophen (TYLENOL) 500 MG tablet, Take by mouth., Disp: , Rfl:  ?  buPROPion (WELLBUTRIN XL) 150 MG 24 hr tablet, Take 1 tablet (150 mg total) by mouth daily., Disp: 90 tablet, Rfl: 1 ?  cephALEXin (KEFLEX) 500 MG capsule, Take 1 capsule (500 mg total) by mouth 2 (two) times daily., Disp: 14 capsule, Rfl: 0 ?  clobetasol (OLUX) 0.05 % topical foam, Apply topically 2 (two) times daily., Disp: 100 g, Rfl: 3 ?  clonazePAM (KLONOPIN) 0.5 MG tablet, Take 1 tablet (0.5 mg total) by mouth 2 (two) times daily., Disp: 30 tablet, Rfl: 2 ?  cycloSPORINE (RESTASIS) 0.05 % ophthalmic emulsion, Restasis 0.05 % eye drops in a dropperette  USE 1 DROP INTO BOTH EYES TWICE A DAY, Disp: , Rfl:  ?  diclofenac (VOLTAREN) 75 MG EC  tablet, Take 1 tablet by mouth twice daily, Disp: 180 tablet, Rfl: 0 ?  eletriptan (RELPAX) 40 MG tablet, TAKE 1 TABLET EVERY 2 HOURS AS NEEDED FOR MIGRAINE, MAX 6 TABLETS PER DAY, Disp: 12 tablet, Rfl: 2 ?  estradiol (VIVELLE-DOT) 0.075 MG/24HR, Place 1 patch onto the skin 2 (two) times a week., Disp: , Rfl:  ?  Fluocinolone Acetonide (FLAC) 0.01 % OIL, Place 1 drop in ear(s) daily. Place 1-2 drop in right ear, Disp: 20 mL, Rfl: 0 ?  HYDROcodone-acetaminophen (NORCO) 5-325 MG tablet, Take 1 tablet by mouth every 6 (six) hours as needed for moderate pain., Disp: 10 tablet, Rfl: 0 ?  imipramine (TOFRANIL) 10 MG tablet, Take 3 tablets (30 mg total) by mouth at bedtime., Disp: 90 tablet, Rfl: 11 ?  levothyroxine (SYNTHROID) 50 MCG tablet, TAKE 1 TABLET BY MOUTH ONCE DAILY BEFORE BREAKFAST, Disp: 90 tablet, Rfl: 1 ?  meclizine (ANTIVERT) 50 MG tablet, Take 1 tablet (50 mg total) by mouth 3 (three) times daily as needed., Disp: 90 tablet, Rfl: 1 ?  methocarbamol (ROBAXIN) 500 MG tablet, Take 1 tablet (500 mg total) by mouth every 6 (six) hours as needed for muscle spasms., Disp: 30 tablet, Rfl: 5 ?  mometasone (NASONEX) 50 MCG/ACT nasal spray, PLACE 2 SPRAYS INTO THE NOSE DAILY., Disp: 51 each, Rfl: 1 ?  Multiple Vitamins-Minerals (MULTIVITAMIN PO), Take by  mouth., Disp: , Rfl:  ?  omeprazole (PRILOSEC) 20 MG capsule, TAKE 1 CAPSULE BY MOUTH DAILY., Disp: 90 capsule, Rfl: 0 ?  ondansetron (ZOFRAN) 4 MG tablet, Take 1 tablet (4 mg total) by mouth every 8 (eight) hours as needed for nausea or vomiting., Disp: 20 tablet, Rfl: 0 ?  tretinoin (RETIN-A) 0.025 % cream, Apply topically at bedtime., Disp: 45 g, Rfl: 3 ? ?Allergies  ?Allergen Reactions  ? Latex Itching and Rash  ? Codeine   ? Gabapentin   ?  "fuzzy"  ? Sulfa Antibiotics   ? ?Past Medical History:  ?Diagnosis Date  ? Chronic fatigue   ? Depression   ? Fibromyalgia   ? IBS (irritable bowel syndrome)   ? Migraine   ? Thyroid disease    ? ? ?Observations/Objective: ?A&O  ?No respiratory distress or wheezing audible over the phone ?Mood, judgement, and thought processes all WNL ? ?Assessment and Plan: ?1. Bilateral sciatica ?- predniSONE (STERAPRED UNI-PAK 21 TAB) 10 MG (21) TBPK tablet; As directed x 6 days  Dispense: 21 tablet; Refill: 0 ? ? ?Follow Up Instructions: ?I discussed the assessment and treatment plan with the patient. The patient was provided an opportunity to ask questions and all were answered. The patient agreed with the plan and demonstrated an understanding of the instructions. ?  ?The patient was advised to call back or seek an in-person evaluation if the symptoms worsen or if the condition fails to improve as anticipated. ? ?The above assessment and management plan was discussed with the patient. The patient verbalized understanding of and has agreed to the management plan. Patient is aware to call the clinic if symptoms persist or worsen. Patient is aware when to return to the clinic for a follow-up visit. Patient educated on when it is appropriate to go to the emergency department.  ? ?Time call ended: 4:53 PM ? ?I provided 11 minutes of non-face-to-face time during this encounter. ? ?Hendricks Limes, MSN, APRN, FNP-C ?[place] ?12/31/21 ? ? ?

## 2022-01-18 ENCOUNTER — Other Ambulatory Visit: Payer: Self-pay | Admitting: Family

## 2022-01-18 ENCOUNTER — Encounter: Payer: Self-pay | Admitting: Family

## 2022-01-18 ENCOUNTER — Ambulatory Visit (INDEPENDENT_AMBULATORY_CARE_PROVIDER_SITE_OTHER): Payer: Medicare HMO | Admitting: Family

## 2022-01-18 VITALS — BP 98/65 | HR 73 | Temp 98.0°F | Ht 64.0 in | Wt 131.8 lb

## 2022-01-18 DIAGNOSIS — F17200 Nicotine dependence, unspecified, uncomplicated: Secondary | ICD-10-CM | POA: Diagnosis not present

## 2022-01-18 DIAGNOSIS — F339 Major depressive disorder, recurrent, unspecified: Secondary | ICD-10-CM | POA: Diagnosis not present

## 2022-01-18 DIAGNOSIS — G43901 Migraine, unspecified, not intractable, with status migrainosus: Secondary | ICD-10-CM | POA: Diagnosis not present

## 2022-01-18 DIAGNOSIS — L659 Nonscarring hair loss, unspecified: Secondary | ICD-10-CM | POA: Diagnosis not present

## 2022-01-18 DIAGNOSIS — R69 Illness, unspecified: Secondary | ICD-10-CM | POA: Diagnosis not present

## 2022-01-18 DIAGNOSIS — Z0100 Encounter for examination of eyes and vision without abnormal findings: Secondary | ICD-10-CM | POA: Diagnosis not present

## 2022-01-18 DIAGNOSIS — K219 Gastro-esophageal reflux disease without esophagitis: Secondary | ICD-10-CM | POA: Diagnosis not present

## 2022-01-18 DIAGNOSIS — M5441 Lumbago with sciatica, right side: Secondary | ICD-10-CM | POA: Diagnosis not present

## 2022-01-18 DIAGNOSIS — E039 Hypothyroidism, unspecified: Secondary | ICD-10-CM | POA: Diagnosis not present

## 2022-01-18 DIAGNOSIS — E785 Hyperlipidemia, unspecified: Secondary | ICD-10-CM

## 2022-01-18 DIAGNOSIS — F419 Anxiety disorder, unspecified: Secondary | ICD-10-CM

## 2022-01-18 MED ORDER — TOPIRAMATE 25 MG PO TABS: 25.0000 mg | ORAL_TABLET | Freq: Two times a day (BID) | ORAL | 1 refills | Status: DC

## 2022-01-18 MED ORDER — CLONAZEPAM 0.5 MG PO TABS: 0.5000 mg | ORAL_TABLET | Freq: Two times a day (BID) | ORAL | 2 refills | Status: DC

## 2022-01-18 NOTE — Patient Instructions (Signed)

## 2022-01-18 NOTE — Progress Notes (Signed)
? ?Subjective:  ? ? Patient ID: Ruth Hopkins, female    DOB: 23-Dec-1965, 56 y.o.   MRN: [id number] ? ?Chief Complaint  ?Patient presents with  ? Medical Management of Chronic Issues  ?  6 mos  ? ?PT presents to the office today for chronic follow up. She is followed by psychiatrist 3 months for hot flashes and GAD.She is currently taking estradiol patch and gradually decreasing and trying to add other medications. She has tried gabapentin, Lexapro, and Effexor, and wellbutrin without success. ? ?She is complaining of hair loss.  ?  ?She has fibromyalgia and states she gets neck pain that causes her migraines. She is followed by GYN annually.  ?Gastroesophageal Reflux ?She complains of belching, heartburn and nausea. She reports no hoarse voice. This is a chronic problem. The current episode started more than 1 year ago. The problem occurs occasionally. The problem has been waxing and waning. The symptoms are aggravated by smoking. Associated symptoms include fatigue. She has tried a PPI for the symptoms. The treatment provided moderate relief.  ?Thyroid Problem ?Presents for follow-up visit. Symptoms include anxiety, constipation, depressed mood and fatigue. Patient reports no diarrhea or hoarse voice. The symptoms have been stable. Her past medical history is significant for hyperlipidemia.  ?Nicotine Dependence ?Presents for follow-up visit. Symptoms include fatigue, insomnia and irritability. Her urge triggers include company of smokers. The symptoms have been stable. She smokes 1 pack of cigarettes per day.  ?Back Pain ?This is a chronic problem. The current episode started more than 1 year ago. The problem occurs intermittently. The problem has been waxing and waning since onset. The pain is present in the lumbar spine. The quality of the pain is described as aching. The pain is at a severity of 8/10. The pain is moderate. The symptoms are aggravated by bending. She has tried bed rest and NSAIDs for the symptoms.  The treatment provided mild relief.  ?Depression ?       This is a chronic problem.  The current episode started more than 1 year ago.   Associated symptoms include fatigue, helplessness, hopelessness, insomnia, irritable, restlessness and sad.  Past medical history includes thyroid problem and anxiety.   ?Anxiety ?Presents for follow-up visit. Symptoms include depressed mood, excessive worry, insomnia, irritability, nausea, nervous/anxious behavior and restlessness. Symptoms occur most days. The severity of symptoms is moderate.  ? ? ?Migraine  ?This is a chronic problem. The current episode started more than 1 year ago. The problem occurs monthly (15 in a month). The problem has been waxing and waning. The pain quality is similar to prior headaches. Associated symptoms include back pain, insomnia, nausea and photophobia. Pertinent negatives include no phonophobia. The symptoms are aggravated by activity and emotional stress. The treatment provided mild relief. Her past medical history is significant for migraines in the family.  ?Hyperlipidemia ?This is a chronic problem. The current episode started more than 1 year ago. Current antihyperlipidemic treatment includes diet change. The current treatment provides mild improvement of lipids.  ? ? ? ?Review of Systems  ?Constitutional:  Positive for fatigue and irritability.  ?HENT:  Negative for hoarse voice.   ?Eyes:  Positive for photophobia.  ?Gastrointestinal:  Positive for constipation, heartburn and nausea. Negative for diarrhea.  ?Musculoskeletal:  Positive for back pain.  ?Psychiatric/Behavioral:  Positive for depression. The patient is nervous/anxious and has insomnia.   ?All other systems reviewed and are negative. ? ?   ?Objective:  ? Physical Exam ?Vitals reviewed.  ?  Constitutional:   ?   General: She is irritable. She is not in acute distress. ?   Appearance: She is well-developed.  ?HENT:  ?   Head: Normocephalic and atraumatic.  ?   Right Ear: Tympanic  membrane normal.  ?   Left Ear: Tympanic membrane normal.  ?Eyes:  ?   Pupils: Pupils are equal, round, and reactive to light.  ?Neck:  ?   Thyroid: No thyromegaly.  ?Cardiovascular:  ?   Rate and Rhythm: Normal rate and regular rhythm.  ?   Heart sounds: Normal heart sounds. No murmur heard. ?Pulmonary:  ?   Effort: Pulmonary effort is normal. No respiratory distress.  ?   Breath sounds: Normal breath sounds. No wheezing.  ?Abdominal:  ?   General: Bowel sounds are normal. There is no distension.  ?   Palpations: Abdomen is soft.  ?   Tenderness: There is no abdominal tenderness.  ?Musculoskeletal:     ?   General: No tenderness. Normal range of motion.  ?   Cervical back: Normal range of motion and neck supple.  ?Skin: ?   General: Skin is warm and dry.  ?Neurological:  ?   Mental Status: She is alert and oriented to person, place, and time.  ?   Cranial Nerves: No cranial nerve deficit.  ?   Deep Tendon Reflexes: Reflexes are normal and symmetric.  ?Psychiatric:     ?   Behavior: Behavior normal.     ?   Thought Content: Thought content normal.     ?   Judgment: Judgment normal.  ? ? ? ? ?BP 98/65   Pulse 73   Temp 98 ?F (36.7 ?C) (Oral)   Ht _0  (1.626 m)   Wt 131 lb 12.8 oz (59.8 kg)   SpO2 99%   BMI 22.62 kg/m?  ? ?   ?Assessment & Plan:  ?Ruth Hopkins comes in today with chief complaint of Medical Management of Chronic Issues (6 mos) ? ? ?Diagnosis and orders addressed: ? ?1. Acute right-sided low back pain with right-sided sciatica ?- CMP14+EGFR ?- CBC with Differential/Platelet ? ?2. Migraine with status migrainosus, not intractable, unspecified migraine type ?-Start Topamax 25 mg nightly for 3-4 days then increase to BID ?- CMP14+EGFR ?- CBC with Differential/Platelet ?- topiramate (TOPAMAX) 25 MG tablet; Take 1 tablet (25 mg total) by mouth 2 (two) times daily.  Dispense: 60 tablet; Refill: 1 ? ?3. Gastroesophageal reflux disease, unspecified whether esophagitis present ?- CMP14+EGFR ?- CBC with  Differential/Platelet ? ?4. Acquired hypothyroidism ?- CMP14+EGFR ?- CBC with Differential/Platelet ?- TSH ? ?5. Anxiety ?- clonazePAM (KLONOPIN) 0.5 MG tablet; Take 1 tablet (0.5 mg total) by mouth 2 (two) times daily.  Dispense: 30 tablet; Refill: 2 ?- CMP14+EGFR ?- CBC with Differential/Platelet ? ?6. Depression, recurrent ([place]) ?- CMP14+EGFR ?- CBC with Differential/Platelet ? ?7. Smoker ?- CMP14+EGFR ?- CBC with Differential/Platelet ? ?8. Hyperlipidemia, unspecified hyperlipidemia type ?- Lipid panel ? ?9. Hair loss ? ? ?Labs pending ?Health Maintenance reviewed ?Diet and exercise encouraged ? ?Follow up plan: ?3 months  ? ?Evelina Dun, FNP ? ? ? ?

## 2022-01-19 ENCOUNTER — Other Ambulatory Visit: Payer: Self-pay | Admitting: Family

## 2022-01-19 DIAGNOSIS — E785 Hyperlipidemia, unspecified: Secondary | ICD-10-CM | POA: Insufficient documentation

## 2022-01-19 DIAGNOSIS — E039 Hypothyroidism, unspecified: Secondary | ICD-10-CM

## 2022-01-19 LAB — CBC WITH DIFFERENTIAL/PLATELET
Basophils Absolute: 0.1 10*3/uL (ref 0.0–0.2)
Basos: 1 %
EOS (ABSOLUTE): 0.7 10*3/uL — ABNORMAL HIGH (ref 0.0–0.4)
Eos: 11 %
Hematocrit: 37.1 % (ref 34.0–46.6)
Hemoglobin: 12.1 g/dL (ref 11.1–15.9)
Immature Grans (Abs): 0 10*3/uL (ref 0.0–0.1)
Immature Granulocytes: 1 %
Lymphocytes Absolute: 1.8 10*3/uL (ref 0.7–3.1)
Lymphs: 29 %
MCH: 29.2 pg (ref 26.6–33.0)
MCHC: 32.6 g/dL (ref 31.5–35.7)
MCV: 90 fL (ref 79–97)
Monocytes Absolute: 0.5 10*3/uL (ref 0.1–0.9)
Monocytes: 7 %
Neutrophils Absolute: 3.3 10*3/uL (ref 1.4–7.0)
Neutrophils: 51 %
Platelets: 251 10*3/uL (ref 150–450)
RBC: 4.14 x10E6/uL (ref 3.77–5.28)
RDW: 12.9 % (ref 11.7–15.4)
WBC: 6.3 10*3/uL (ref 3.4–10.8)

## 2022-01-19 LAB — LIPID PANEL
Chol/HDL Ratio: 4 ratio (ref 0.0–4.4)
Cholesterol, Total: 245 mg/dL — ABNORMAL HIGH (ref 100–199)
HDL: 61 mg/dL (ref >39)
LDL Chol Calc (NIH): 170 mg/dL — ABNORMAL HIGH (ref 0–99)
Triglycerides: 80 mg/dL (ref 0–149)
VLDL Cholesterol Cal: 14 mg/dL (ref 5–40)

## 2022-01-19 LAB — CMP14+EGFR
ALT: 33 IU/L — ABNORMAL HIGH (ref 0–32)
AST: 24 IU/L (ref 0–40)
Albumin/Globulin Ratio: 1.6 (ref 1.2–2.2)
Albumin: 4.1 g/dL (ref 3.8–4.9)
Alkaline Phosphatase: 65 IU/L (ref 44–121)
BUN/Creatinine Ratio: 32 — ABNORMAL HIGH (ref 9–23)
BUN: 24 mg/dL (ref 6–24)
Bilirubin Total: 0.3 mg/dL (ref 0.0–1.2)
CO2: 24 mmol/L (ref 20–29)
Calcium: 8.8 mg/dL (ref 8.7–10.2)
Chloride: 106 mmol/L (ref 96–106)
Creatinine, Ser: 0.76 mg/dL (ref 0.57–1.00)
Globulin, Total: 2.5 g/dL (ref 1.5–4.5)
Glucose: 78 mg/dL (ref 70–99)
Potassium: 4.3 mmol/L (ref 3.5–5.2)
Sodium: 143 mmol/L (ref 134–144)
Total Protein: 6.6 g/dL (ref 6.0–8.5)
eGFR: 92 mL/min/{1.73_m2} (ref >59)

## 2022-01-19 LAB — TSH: TSH: 2.67 u[IU]/mL (ref 0.450–4.500)

## 2022-01-20 ENCOUNTER — Telehealth: Payer: Self-pay

## 2022-01-20 NOTE — Chronic Care Management (AMB) (Signed)
?  Chronic Care Management  ? ?Outreach Note ? ?01/20/2022 ?Name: [name] MRN: [id number] DOB: 09-10-1966 ? ?Ruth Hopkins is a 56 y.o. year old female who is a primary care patient of Sharion Balloon, FNP. I reached out to [name] by phone today in response to a referral sent by Ms. Gustavus Messing Guttierrez's primary care provider. ? ?An unsuccessful telephone outreach was attempted today. The patient was referred to the case management team for assistance with care management and care coordination.  ? ?Follow Up Plan: A HIPAA compliant phone message was left for the patient providing contact information and requesting a return call.  ?The care management team will reach out to the patient again over the next 7 days.  ?If patient returns call to provider office, please advise to call [facility] * at [phone]* ? ?Noreene Larsson, RMA ?Care Guide, Embedded Care Coordination ?[facility]  Care Management  ?[place], [place] [address] ?Direct Dial: [phone] ?Museum/[email or web address] ?Website: [email or web address]  ? ?

## 2022-01-20 NOTE — Chronic Care Management (AMB) (Signed)
?  Chronic Care Management  ? ?Note ? ?01/20/2022 ?Name: OVELLA MANYGOATS MRN: [id number] DOB: June 11, 1966 ? ?Ruth Hopkins is a 56 y.o. year old female who is a primary care patient of Sharion Balloon, FNP. I reached out to [name] by phone today in response to a referral sent by Ms. Gustavus Messing Capo's PCP. ? ?Ms. Barreras was given information about Chronic Care Management services today including:  ?CCM service includes personalized support from designated clinical staff supervised by her physician, including individualized plan of care and coordination with other care providers ?24/7 contact phone numbers for assistance for urgent and routine care needs. ?Service will only be billed when office clinical staff spend 20 minutes or more in a month to coordinate care. ?Only one practitioner may furnish and bill the service in a calendar month. ?The patient may stop CCM services at any time (effective at the end of the month) by phone call to the office staff. ?The patient is responsible for co-pay (up to 20% after annual deductible is met) if co-pay is required by the individual health plan.  ? ?Patient agreed to services and verbal consent obtained.  ? ?Follow up plan: ?Telephone appointment with care management team member scheduled for:01/22/2022 ? ?Noreene Larsson, RMA ?Care Guide, Embedded Care Coordination ?[facility]  Care Management  ?[place], [place] [address] ?Direct Dial: [phone] ?Museum/gallery [email or web address] ?Website: [email or web address]  ? ?

## 2022-01-22 ENCOUNTER — Ambulatory Visit: Payer: Medicare HMO | Admitting: *Deleted

## 2022-01-22 DIAGNOSIS — Z741 Need for assistance with personal care: Secondary | ICD-10-CM

## 2022-01-22 DIAGNOSIS — E039 Hypothyroidism, unspecified: Secondary | ICD-10-CM

## 2022-01-22 DIAGNOSIS — E785 Hyperlipidemia, unspecified: Secondary | ICD-10-CM

## 2022-01-26 ENCOUNTER — Ambulatory Visit: Payer: Medicare HMO | Admitting: Dermatology

## 2022-01-27 NOTE — Chronic Care Management (AMB) (Signed)
? Care Management ?  ? RN Visit Note ? ?01/22/2022 ?Name: Ruth Hopkins MRN: [id number] DOB: June 12, 1966 ? ?Subjective: ?Ruth Hopkins is a 56 y.o. year old female who is a primary care patient of Sharion Balloon, FNP. The care management team was consulted for assistance with disease management and care coordination needs.   ? ?Engaged with patient by telephone for initial visit in response to provider referral for case management and/or care coordination services.  ? ?Consent to Services:  ? Ruth Hopkins was given information about Care Management services today including:  ?Care Management services includes personalized support from designated clinical staff supervised by her physician, including individualized plan of care and coordination with other care providers ?24/7 contact phone numbers for assistance for urgent and routine care needs. ?The patient may stop case management services at any time by phone call to the office staff. ? ?Patient agreed to services and consent obtained.  ? ?Assessment: Review of patient past medical history, allergies, medications, health status, including review of consultants reports, laboratory and other test data, was performed as part of comprehensive evaluation and provision of chronic care management services.  ? ?SDOH (Social Determinants of Health) assessments and interventions performed:  ?SDOH Interventions   ? ?Flowsheet Row Most Recent Value  ?SDOH Interventions   ?Transportation Interventions Intervention Not Indicated  ? ?  ?  ? ?Care Plan ? ?Allergies  ?Allergen Reactions  ? Latex Itching and Rash  ? Codeine   ? Gabapentin   ?  "fuzzy"  ? Sulfa Antibiotics   ? ? ?Outpatient Encounter Medications as of 01/22/2022  ?Medication Sig  ? acetaminophen (TYLENOL) 500 MG tablet Take by mouth.  ? buPROPion (WELLBUTRIN XL) 150 MG 24 hr tablet Take 1 tablet (150 mg total) by mouth daily.  ? clobetasol (OLUX) 0.05 % topical foam Apply topically 2 (two) times daily.  ? clonazePAM (KLONOPIN)  0.5 MG tablet Take 1 tablet (0.5 mg total) by mouth 2 (two) times daily.  ? cycloSPORINE (RESTASIS) 0.05 % ophthalmic emulsion Restasis 0.05 % eye drops in a dropperette ? USE 1 DROP INTO BOTH EYES TWICE A DAY  ? diclofenac (VOLTAREN) 75 MG EC tablet Take 1 tablet by mouth twice daily  ? eletriptan (RELPAX) 40 MG tablet TAKE 1 TABLET BY MOUTH EVERY 2 HOURS AS NEEDED FOR MIGRAINE, MAX 6 TABLETS PER DAY.  ? estradiol (VIVELLE-DOT) 0.075 MG/24HR Place 1 patch onto the skin 2 (two) times a week.  ? Fluocinolone Acetonide (FLAC) 0.01 % OIL Place 1 drop in ear(s) daily. Place 1-2 drop in right ear  ? imipramine (TOFRANIL) 10 MG tablet Take 3 tablets (30 mg total) by mouth at bedtime.  ? levothyroxine (SYNTHROID) 50 MCG tablet TAKE 1 TABLET BY MOUTH ONCE DAILY BEFORE BREAKFAST  ? meclizine (ANTIVERT) 50 MG tablet Take 1 tablet (50 mg total) by mouth 3 (three) times daily as needed.  ? methocarbamol (ROBAXIN) 500 MG tablet Take 1 tablet (500 mg total) by mouth every 6 (six) hours as needed for muscle spasms.  ? mometasone (NASONEX) 50 MCG/ACT nasal spray PLACE 2 SPRAYS INTO THE NOSE DAILY.  ? Multiple Vitamins-Minerals (MULTIVITAMIN PO) Take by mouth.  ? omeprazole (PRILOSEC) 20 MG capsule TAKE 1 CAPSULE BY MOUTH DAILY.  ? ondansetron (ZOFRAN) 4 MG tablet Take 1 tablet (4 mg total) by mouth every 8 (eight) hours as needed for nausea or vomiting.  ? topiramate (TOPAMAX) 25 MG tablet Take 1 tablet (25 mg total) by mouth 2 (  two) times daily.  ? [EXPIRED] tretinoin (RETIN-A) 0.025 % cream Apply topically at bedtime.  ? valACYclovir (VALTREX) 500 MG tablet Take 500 mg by mouth daily as needed.  ? ?No facility-administered encounter medications on file as of 01/22/2022.  ? ? ?Patient Active Problem List  ? Diagnosis Date Noted  ? Hyperlipidemia 01/19/2022  ? Left ear pain 10/05/2021  ? Neck pain, acute 04/17/2021  ? Educated about COVID-19 virus infection 09/02/2020  ? Gastroesophageal reflux disease 07/24/2020  ? Menopause  10/21/2019  ? Anxiety 07/12/2019  ? Lumbar pain with radiation down right leg 11/09/2018  ? Right sciatic nerve pain 11/09/2018  ? Genital herpes simplex 10/12/2018  ? 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 Depression, recurrent ([place]) 05/26/2018  ? History of adenomatous polyp of colon 12/25/2017  ? Internal hemorrhoids 12/25/2017  ? Dense breast tissue on mammogram 07/18/2017  ? Acquired hypothyroidism 12/10/2016  ? At high risk for breast cancer 09/30/2016  ? 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 Fatigue 01/10/2014  ? Irritable bowel syndrome with constipation 01/10/2014  ? Painful lumpy right breast 09/05/2013  ? Smoker 07/09/2013  ? Fibromyalgia 05/24/2013  ? Migraine with status migrainosus 05/24/2013  ? Morton's neuroma of right foot 05/24/2013  ? Pain in joint involving lower leg 05/24/2013  ? Lesion of plantar nerve 05/24/2013  ? 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 ? ?Conditions to be addressed/monitored: HLD, Hypothyroidism, and diet/healthy eating resource needs ? ?Care Plan : RNCM Care Plan  ?  ? ?Problem: Chronic Disease Management Needs   ?Priority: High  ?Onset Date: 01/22/2022  ?  ? ?Long-Range Goal: Diet Management in Patient with Hyperlipidemia and hypothyroidism   ?Start Date: 01/22/2022  ?Expected End Date: 01/23/2023  ?This Visit's Progress: On track  ?Priority: High  ?Note:   ?Current Barriers:  ?Knowledge deficits related to healthy diet for cholesterol and weight management in a patient with hyperlipidemia and hypothyroidism ?Chronic foot pain. In need of new orthotics. Cost is a limiting factor.  ? ?RNCM Clinical Goal(s):  ?Patient will continue to work with RN Care Manager and/or Social Worker to address care management and care coordination needs related to HLD and Hypothyroidism as evidenced by adherence to CM Team Scheduled appointments     through collaboration with LCSW, provider, and care team.  ? ?Interventions: ?1:1 collaboration with primary  care provider regarding development and update of comprehensive plan of care as evidenced by provider attestation and co-signature ?Inter-disciplinary care team collaboration (see longitudinal plan of care) ?Evaluation of current treatment plan related to  self management and patient's adherence to plan as established by provider ?Discussed mobility and ability to perform ADLs ?SDOH assessment performed ?Discussed family/social support ?Referral placed to [facility] for assistance with dental resources ? ? ?Hyperlipidemia:  (Status: New goal.) Long Term Goal  ?Lab Results  ?Component Value Date  ? CHOL 245 (H) 01/18/2022  ? HDL 61 01/18/2022  ? LDLCALC 170 (H) 01/18/2022  ? TRIG 80 01/18/2022  ? CHOLHDL 4.0 01/18/2022  ?  ?Medication review performed; medication list updated in electronic medical record.  ?Provider established cholesterol goals reviewed; ?Counseled on importance of regular laboratory monitoring as prescribed; ?Reviewed role and benefits of statin for ASCVD risk reduction; ?Reviewed importance of limiting foods high in cholesterol; ?Reviewed exercise goals and target of 150 minutes per week; ?Assessed social determinant of health barriers;  ?Reviewed most recent office notes ?Discussed that patient was not fasting prior to  most recent labs ?Reviewed upcoming appointments ?Encouraged to fast prior to having labwork done at next visit ? ? ?Hypothyroidism:  (Status: New goal.) Long Term Goal  ?Lab Results  ?Component Value Date  ? TSH 2.670 01/18/2022  ?Evaluation of current treatment plan related to hypothyroidism ?Reviewed and discussed current medications ?Reviewed and discusssed most recent office visit and lab results ?Encouraged to monitor for signs of thyroid dysfunction and to call PCP with any new or worsening symptoms ? ? ?Diet/Healthy Eating:  (Status: New goal.)  ?Provided verbal and/or written education to patient re: provider recommended life style modifications;  ?Offered to  connect patient with psychology or social work support for counseling and supportive care;  ?Reviewed recommended dietary changes: avoid fad diets, make small/incremental dietary and exercise changes, eat at the

## 2022-01-27 NOTE — Patient Instructions (Signed)
Visit Information ? ? ?Patient Goals/Self-Care Activities: ?Take medications as prescribed   ?Attend all scheduled provider appointments ?Perform all self care activities independently  ?Perform IADL's (shopping, preparing meals, housekeeping, managing finances) independently ?Call provider office for new concerns or questions  ?Patient will perform moderatetly strenuous exercises at least 150 minutes per week ?Review resources regarding diet/healthy eating ?Call RN Care Manager as needed [phone] ?Fast before having lab work done at next office visit ? ?Patient verbalizes understanding of instructions and care plan provided today and agrees to view in [facility]. Active MyChart status confirmed with patient.   ? ?Plan: Telephone follow up appointment with care management team member scheduled for:  02/01/22 with RNCM ?The patient has been provided with contact information for the care management team and has been advised to call with any health related questions or concerns.  ? ?Chong Sicilian, BSN, RN-BC ?Embedded Chronic Care Manager ?[facility] Management ?Direct Dial: [phone] ? [phone]   ?

## 2022-01-28 ENCOUNTER — Other Ambulatory Visit: Payer: Self-pay | Admitting: Family

## 2022-01-28 ENCOUNTER — Telehealth: Payer: Self-pay | Admitting: Podiatry

## 2022-01-28 ENCOUNTER — Telehealth: Payer: Self-pay | Admitting: *Deleted

## 2022-01-28 DIAGNOSIS — K219 Gastro-esophageal reflux disease without esophagitis: Secondary | ICD-10-CM

## 2022-01-28 NOTE — Telephone Encounter (Signed)
? ?  Telephone encounter was:  Successful.  ?01/28/2022 ?Name: [name] MRN: [id number] DOB: [birth year] ? ?Ruth Hopkins is a 56 y.o. year old female who is a primary care patient of Sharion Balloon, FNP . The community resource team was consulted for assistance with  dental ? ?Care guide performed the following interventions: Patient provided with information about care guide support team and interviewed to confirm resource needs. ? ?Follow Up Plan:  No further follow up planned at this time. The patient has been provided with needed resources. ?Lovett Sox -Selinda Eon ?Care Guide , Embedded Care Coordination ?[facility], Care Management  ?[phone] ?[address] , [place] [address] ?Email : [email or web address] ?  ? ? ? ?

## 2022-01-28 NOTE — Telephone Encounter (Signed)
Pt is wanting to purchase a  large toe cap and is wanting to know if she pays for it can you mail it to her.She lives about 45 minutes away. ?

## 2022-02-01 ENCOUNTER — Ambulatory Visit: Payer: Medicare HMO | Admitting: *Deleted

## 2022-02-01 DIAGNOSIS — E039 Hypothyroidism, unspecified: Secondary | ICD-10-CM

## 2022-02-01 DIAGNOSIS — F419 Anxiety disorder, unspecified: Secondary | ICD-10-CM

## 2022-02-01 DIAGNOSIS — E785 Hyperlipidemia, unspecified: Secondary | ICD-10-CM

## 2022-02-01 NOTE — Chronic Care Management (AMB) (Signed)
? Care Management ?  ? RN Visit Note ? ?02/01/2022 ?Name: Ruth Hopkins MRN: [id number] DOB: 03/14/1966 ? ?Subjective: ?Ruth Hopkins is a 56 y.o. year old female who is a primary care patient of Sharion Balloon, FNP. The care management team was consulted for assistance with disease management and care coordination needs.   ? ?Engaged with patient by telephone for follow up visit in response to provider referral for case management and/or care coordination services.  ? ?Consent to Services:  ? Ms. Kohler was given information about Care Management services today including:  ?Care Management services includes personalized support from designated clinical staff supervised by her physician, including individualized plan of care and coordination with other care providers ?24/7 contact phone numbers for assistance for urgent and routine care needs. ?The patient may stop case management services at any time by phone call to the office staff. ? ?Patient agreed to services and consent obtained.  ? ?Assessment: Review of patient past medical history, allergies, medications, health status, including review of consultants reports, laboratory and other test data, was performed as part of comprehensive evaluation and provision of chronic care management services.  ? ?SDOH (Social Determinants of Health) assessments and interventions performed:   ? ?Care Plan ? ?Allergies  ?Allergen Reactions  ? Latex Itching and Rash  ? Codeine   ? Gabapentin   ?  "fuzzy"  ? Sulfa Antibiotics   ? ? ?Outpatient Encounter Medications as of 02/01/2022  ?Medication Sig  ? acetaminophen (TYLENOL) 500 MG tablet Take by mouth.  ? buPROPion (WELLBUTRIN XL) 150 MG 24 hr tablet Take 1 tablet (150 mg total) by mouth daily.  ? clobetasol (OLUX) 0.05 % topical foam Apply topically 2 (two) times daily.  ? clonazePAM (KLONOPIN) 0.5 MG tablet Take 1 tablet (0.5 mg total) by mouth 2 (two) times daily.  ? cycloSPORINE (RESTASIS) 0.05 % ophthalmic emulsion Restasis 0.05  % eye drops in a dropperette ? USE 1 DROP INTO BOTH EYES TWICE A DAY  ? diclofenac (VOLTAREN) 75 MG EC tablet Take 1 tablet by mouth twice daily  ? eletriptan (RELPAX) 40 MG tablet TAKE 1 TABLET BY MOUTH EVERY 2 HOURS AS NEEDED FOR MIGRAINE, MAX 6 TABLETS PER DAY.  ? estradiol (VIVELLE-DOT) 0.075 MG/24HR Place 1 patch onto the skin 2 (two) times a week.  ? Fluocinolone Acetonide (FLAC) 0.01 % OIL Place 1 drop in ear(s) daily. Place 1-2 drop in right ear  ? imipramine (TOFRANIL) 10 MG tablet Take 3 tablets (30 mg total) by mouth at bedtime.  ? levothyroxine (SYNTHROID) 50 MCG tablet TAKE 1 TABLET BY MOUTH ONCE DAILY BEFORE BREAKFAST  ? meclizine (ANTIVERT) 50 MG tablet Take 1 tablet (50 mg total) by mouth 3 (three) times daily as needed.  ? methocarbamol (ROBAXIN) 500 MG tablet Take 1 tablet (500 mg total) by mouth every 6 (six) hours as needed for muscle spasms.  ? mometasone (NASONEX) 50 MCG/ACT nasal spray PLACE 2 SPRAYS INTO THE NOSE DAILY.  ? Multiple Vitamins-Minerals (MULTIVITAMIN PO) Take by mouth.  ? omeprazole (PRILOSEC) 20 MG capsule Take 1 capsule by mouth once daily  ? ondansetron (ZOFRAN) 4 MG tablet Take 1 tablet (4 mg total) by mouth every 8 (eight) hours as needed for nausea or vomiting.  ? topiramate (TOPAMAX) 25 MG tablet Take 1 tablet (25 mg total) by mouth 2 (two) times daily.  ? valACYclovir (VALTREX) 500 MG tablet Take 500 mg by mouth daily as needed.  ? ?No facility-administered encounter  medications on file as of 02/01/2022.  ? ? ?Patient Active Problem List  ? Diagnosis Date Noted  ? Hyperlipidemia 01/19/2022  ? Left ear pain 10/05/2021  ? Neck pain, acute 04/17/2021  ? Educated about COVID-19 virus infection 09/02/2020  ? Gastroesophageal reflux disease 07/24/2020  ? Menopause 10/21/2019  ? Anxiety 07/12/2019  ? Lumbar pain with radiation down right leg 11/09/2018  ? Right sciatic nerve pain 11/09/2018  ? Genital herpes simplex 10/12/2018  ? 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 Depression, recurrent ([place]) 05/26/2018  ? History of adenomatous polyp of colon 12/25/2017  ? Internal hemorrhoids 12/25/2017  ? Dense breast tissue on mammogram 07/18/2017  ? Acquired hypothyroidism 12/10/2016  ? At high risk for breast cancer 09/30/2016  ? 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 Fatigue 01/10/2014  ? Irritable bowel syndrome with constipation 01/10/2014  ? Painful lumpy right breast 09/05/2013  ? Smoker 07/09/2013  ? Fibromyalgia 05/24/2013  ? Migraine with status migrainosus 05/24/2013  ? Morton's neuroma of right foot 05/24/2013  ? Pain in joint involving lower leg 05/24/2013  ? Lesion of plantar nerve 05/24/2013  ? 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 ? ?Conditions to be addressed/monitored: HLD, Hypothyroidism, and stress ? ?Care Plan : [facility] Care Plan  ?Updates made by Ilean China, RN since 02/01/2022 12:00 AM  ?  ? ?Problem: Chronic Disease Management Needs   ?Priority: High  ?Onset Date: 01/22/2022  ?  ? ?Long-Range Goal: Diet Management in Patient with Hyperlipidemia and hypothyroidism   ?Start Date: 01/22/2022  ?Expected End Date: 01/23/2023  ?This Visit's Progress: On track  ?Recent Progress: On track  ?Priority: High  ?Note:   ?Current Barriers:  ?Knowledge deficits related to healthy diet for cholesterol and weight management in a patient with hyperlipidemia and hypothyroidism ?Chronic foot pain. In need of new orthotics. Cost is a limiting factor.  ? ?RNCM Clinical Goal(s):  ?Patient will continue to work with RN Care Manager and/or Social Worker to address care management and care coordination needs related to HLD and Hypothyroidism as evidenced by adherence to CM Team Scheduled appointments     through collaboration with LCSW, provider, and care team.  ? ?Interventions: ?1:1 collaboration with primary care provider regarding development and update of comprehensive plan of care as evidenced by provider attestation and co-signature ?Inter-disciplinary care  team collaboration (see longitudinal plan of care) ?Evaluation of current treatment plan related to  self management and patient's adherence to plan as established by provider ?Discussed mobility and ability to perform ADLs ?SDOH assessment performed ?Discussed family/social support ?Relationship stress due to husband's alcohol use. No safety concerns at present.  ?Referral placed to [facility] for assistance with dental resources. ?Confirmed that patient was outreached by Care Guides and resources were provided ? ? ?Hyperlipidemia:  (Status: Goal on Track (progressing): YES.) Long Term Goal  ?Lab Results  ?Component Value Date  ? CHOL 245 (H) 01/18/2022  ? HDL 61 01/18/2022  ? LDLCALC 170 (H) 01/18/2022  ? TRIG 80 01/18/2022  ? CHOLHDL 4.0 01/18/2022  ?  ?Medication review performed ?Provider established cholesterol goals reviewed; ?Counseled on importance of regular laboratory monitoring as prescribed; ?Reviewed role and benefits of statin for ASCVD risk reduction; ?Reviewed importance of limiting foods high in cholesterol; ?Reviewed exercise goals and target of 150 minutes per week; ?Assessed social determinant of health barriers;  ?Reviewed most recent office notes ?Previously discussed that patient was not fasting prior to  most recent labs ?Reviewed upcoming appointments ?reminded to fast prior to having labwork done at next visit ?Confirmed that patient received nutrition/diet educational resources that were mailed to patient ? ? ?Hypothyroidism:  (Status: Condition stable. Not addressed this visit.) Long Term Goal  ?Lab Results  ?Component Value Date  ? TSH 2.670 01/18/2022  ?Evaluation of current treatment plan related to hypothyroidism ?Reviewed and discussed current medications ?Reviewed and discusssed most recent office visit and lab results ?Encouraged to monitor for signs of thyroid dysfunction and to call PCP with any new or worsening symptoms ? ? ?Diet/Healthy Eating:  (Status: Goal on  Track (progressing): YES.)  ?Offered to connect patient with psychology or social work support for counseling and supportive care;  ?Reviewed recommended dietary changes: avoid fad diets, make small/increm

## 2022-02-01 NOTE — Patient Instructions (Signed)
Visit Information  ? ?Thank you for taking time to visit with me today. Please don't hesitate to contact me if I can be of assistance to you before our next scheduled telephone appointment. ? ?Following are the goals we discussed today:  ?Take medications as prescribed   ?Attend all scheduled provider appointments ?Perform all self care activities independently  ?Perform IADL's (shopping, preparing meals, housekeeping, managing finances) independently ?Call provider office for new concerns or questions  ?Patient will perform moderatetly strenuous exercises at least 150 minutes per week ?Review resources regarding diet/healthy eating ?Call RN Care Manager as needed [phone] ?Fast before having lab work done at next office visit ?For a mental health crisis, please call [facility] at [phone] or call 911 ?Talk with care guide regarding scheduling an appt with LCSW ? ?Our next appointment is by telephone on 02/17/22 at 11:00 ? ?Please call the care guide team at [phone] if you need to cancel or reschedule your appointment.  ? ?If you are experiencing a Mental Health or [place] or need someone to talk to, please call the [facility]: [phone] ?call 911  ? ? ? ?Patient verbalizes understanding of instructions and care plan provided today and agrees to view in [place]. Active MyChart status confirmed with patient.   ? ?Telephone follow up appointment with care management team member scheduled for: 02/17/22 ?The patient has been provided with contact information for the care management team and has been advised to call with any health related questions or concerns.  ? ?Chong Sicilian, BSN, RN-BC ?Embedded Chronic Care Manager ?[place] [facility] Management ?Direct Dial: [phone] ? ? ? ?  ?

## 2022-02-02 ENCOUNTER — Telehealth: Payer: Self-pay

## 2022-02-02 NOTE — Chronic Care Management (AMB) (Signed)
  Chronic Care Management   Note  02/02/2022 Name: Ruth Hopkins MRN: [id number] DOB: August 31, 1966  Ruth Hopkins is a 56 y.o. year old female who is a primary care patient of Sharion Balloon, FNP. Ruth Hopkins is currently enrolled in care management services. An additional referral for LCSW was placed.   Follow up plan: Unsuccessful telephone outreach attempt made. A HIPAA compliant phone message was left for the patient providing contact information and requesting a return call.  The care management team will reach out to the patient again over the next LCSW days.  If patient returns call to provider office, please advise to call [facility]  at [place], [address], [place], [place] [address] Direct Dial: [phone] [email or web address]: [email or web address]

## 2022-02-03 DIAGNOSIS — Z0100 Encounter for examination of eyes and vision without abnormal findings: Secondary | ICD-10-CM | POA: Diagnosis not present

## 2022-02-05 ENCOUNTER — Ambulatory Visit: Payer: Medicare HMO | Admitting: Podiatry

## 2022-02-10 ENCOUNTER — Encounter: Payer: Self-pay | Admitting: Podiatry

## 2022-02-10 ENCOUNTER — Ambulatory Visit: Payer: Medicare HMO | Admitting: Podiatry

## 2022-02-10 DIAGNOSIS — M7751 Other enthesopathy of right foot: Secondary | ICD-10-CM

## 2022-02-10 DIAGNOSIS — L600 Ingrowing nail: Secondary | ICD-10-CM | POA: Diagnosis not present

## 2022-02-10 MED ORDER — TRIAMCINOLONE ACETONIDE 10 MG/ML IJ SUSP
10.0000 mg | Freq: Once | INTRAMUSCULAR | Status: AC
  Administered 2022-02-10: 10 mg

## 2022-02-10 NOTE — Patient Instructions (Signed)

## 2022-02-10 NOTE — Chronic Care Management (AMB) (Signed)
  Chronic Care Management   Note  02/10/2022 Name: Ruth Hopkins MRN: [id number] DOB: [birth year]  Ruth Hopkins is a 56 y.o. year old female who is a primary care patient of Sharion Balloon, FNP. Ruth Hopkins is currently enrolled in care management services. An additional referral for LCSW  was placed.   Follow up plan: Unsuccessful telephone outreach attempt made. A HIPAA compliant phone message was left for the patient providing contact information and requesting a return call.  The care management team will reach out to the patient again over the next 5 days.  If patient returns call to provider office, please advise to call [facility] at [place], [address], [place], [place] [address] Direct Dial: [phone] [email or web address]: [email or web address]

## 2022-02-10 NOTE — Progress Notes (Signed)
Subjective:   Patient ID: Ruth Hopkins, female   DOB: 56 y.o.   MRN: [id number]   HPI Patient presents stating my right ankle and more distal is really bothering me and I have an ingrown toenail on my left over right big toe that is sore and I have tried to trim and soak and use devices without relief.   ROS      Objective:  Physical Exam  Vascular status was found to be intact with incurvated medial border left hallux painful when pressed with no erythema no edema no drainage noted and on the right I did note inflammation pain of the sinus tarsi with fluid buildup of the joint surface and its going in a more distal direction with inflammation noted in     Assessment:  Grown toenail deformity of the left hallux medial border with pain no indication infection along with inflammatory capsulitis that was doing okay for a few months but has started to become bothersome again     Plan:  Reviewed both conditions and recommended correction of ingrown toenail patient wanting to get this fixed.  I explained procedure risk and patient wants surgery and I explained the procedure to her and she signed consent form.  I infiltrated the left hallux 60 mg like Marcaine mixture sterile prep done and using sterile instrumentation I remove the medial border exposed matrix applied phenol 3 applications 30 seconds followed by alcohol lavage sterile dressing gave instructions on soaks leave dressing on 24 hours take it off earlier if throbbing were to occur and for the right I did do 1 more carefully guided injection of the sinus tarsi and distal into the lateral ankle gutter 3 mg Dexasone Kenalog 5 mg Xylocaine with sterile dressing applied

## 2022-02-11 ENCOUNTER — Ambulatory Visit: Payer: Medicare HMO | Admitting: Podiatry

## 2022-02-16 NOTE — Chronic Care Management (AMB) (Signed)
Care Management   Note  02/16/2022 Name: Ruth Hopkins MRN: [id number] DOB: 01-30-1966  Ruth Hopkins is a 56 y.o. year old female who is a primary care patient of Sharion Balloon, FNP. Ruth Hopkins is currently enrolled in care management services. An additional referral for LCSW was placed.   Follow up plan: Unable to make contact on outreach attempts x 3. PCP Sharion Balloon, FNP notified via routed documentation in medical record.   Noreene Lar[address], [place], [place],  [address] Direct Dial: [phone] [email or web address] Website: [email or web address]

## 2022-02-17 ENCOUNTER — Telehealth: Payer: Medicare HMO | Admitting: *Deleted

## 2022-02-17 ENCOUNTER — Telehealth: Payer: Self-pay | Admitting: *Deleted

## 2022-02-18 NOTE — Telephone Encounter (Signed)
   02/18/2022  Ruth Hopkins 30-May-1966 [id number]   Care Management   Follow Up Note   02/17/2022 Name: Ruth Hopkins MRN: [id number] DOB: 27-Dec-1965   Referred by: Sharion Balloon, FNP Reason for referral : Chronic Care Management (Unsuccessful telephone follow-up)   An unsuccessful telephone outreach was attempted today. The patient was referred to the case management team for assistance with care management and care coordination.   Follow Up Plan: A HIPPA compliant phone message was left for the patient providing contact information and requesting a return call. Forwarding to Care Guide for outreach and rescheduling.  Chong Sicilian, BSN, RN-BC Embedded Chronic Care Manager [facility] [facility] Direct Dial: [phone]

## 2022-02-19 ENCOUNTER — Other Ambulatory Visit: Payer: Self-pay

## 2022-02-19 ENCOUNTER — Ambulatory Visit (INDEPENDENT_AMBULATORY_CARE_PROVIDER_SITE_OTHER): Payer: Medicare HMO

## 2022-02-19 ENCOUNTER — Telehealth: Payer: Self-pay

## 2022-02-19 VITALS — Wt 131.0 lb

## 2022-02-19 DIAGNOSIS — Z0000 Encounter for general adult medical examination without abnormal findings: Secondary | ICD-10-CM

## 2022-02-19 DIAGNOSIS — F419 Anxiety disorder, unspecified: Secondary | ICD-10-CM

## 2022-02-19 DIAGNOSIS — F321 Major depressive disorder, single episode, moderate: Secondary | ICD-10-CM

## 2022-02-19 NOTE — Patient Instructions (Addendum)
Ruth Hopkins , Thank you for taking time to come for your Medicare Wellness Visit. I appreciate your ongoing commitment to your health goals. Please review the following plan we discussed and let me know if I can assist you in the future.   Screening recommendations/referrals: Colonoscopy: Done 01/10/2018 - Repeat in 5 years  Mammogram: Done 09/17/2021 - Repeat annually Bone Density: Due at age 56 Recommended yearly ophthalmology/optometry visit for glaucoma screening and checkup Recommended yearly dental visit for hygiene and checkup  Vaccinations: Influenza vaccine: Done 2022 - repeat every fall Pneumococcal vaccine: Recommend once per lifetime Prevnar-20 Tdap vaccine: Done 05/11/2021 - Repeat in 10 years Shingles vaccine: Shingrix is 2 doses 2-6 months apart and over 90% effective  *consider getting at next visit  Covid-19: Declined  Advanced directives: Advance directive discussed with you today. Even though you declined this today, please call our office should you change your mind, and we can give you the proper paperwork for you to fill out.   Conditions/risks identified: Aim for 30 minutes of exercise or brisk walking, 6-8 glasses of water, and 5 servings of fruits and vegetables each day. We will send a chronic care management referral to see if our social worker can help you with therapy.  If you wish to quit smoking, help is available. For free tobacco cessation program offerings call the [facility] at (336) 720-2513 or Live Well Line at 314-744-9113. You may also visit www.[place].com or email livelifewell_0 .com for more information on other programs.   You may also call 1-800-QUIT-NOW 512-061-0286) or visit www.VirusCrisis.dk or www.BecomeAnEx.org for additional resources on smoking cessation.    Next appointment: Follow up in one year for your annual wellness visit.   Preventive Care 40-64 Years, Female Preventive care refers to lifestyle choices  and visits with your health care provider that can promote health and wellness. What does preventive care include? A yearly physical exam. This is also called an annual well check. Dental exams once or twice a year. Routine eye exams. Ask your health care provider how often you should have your eyes checked. Personal lifestyle choices, including: Daily care of your teeth and gums. Regular physical activity. Eating a healthy diet. Avoiding tobacco and drug use. Limiting alcohol use. Practicing safe sex. Taking low-dose aspirin daily starting at age 59. Taking vitamin and mineral supplements as recommended by your health care provider. What happens during an annual well check? The services and screenings done by your health care provider during your annual well check will depend on your age, overall health, lifestyle risk factors, and family history of disease. Counseling  Your health care provider may ask you questions about your: Alcohol use. Tobacco use. Drug use. Emotional well-being. Home and relationship well-being. Sexual activity. Eating habits. Work and work Statistician. Method of birth control. Menstrual cycle. Pregnancy history. Screening  You may have the following tests or measurements: Height, weight, and BMI. Blood pressure. Lipid and cholesterol levels. These may be checked every 5 years, or more frequently if you are over 37 years old. Skin check. Lung cancer screening. You may have this screening every year starting at age 22 if you have a 30-pack-year history of smoking and currently smoke or have quit within the past 15 years. Fecal occult blood test (FOBT) of the stool. You may have this test every year starting at age 40. Flexible sigmoidoscopy or colonoscopy. You may have a sigmoidoscopy every 5 years or a colonoscopy every 10 years starting at age  50. Hepatitis C blood test. Hepatitis B blood test. Sexually transmitted disease (STD) testing. Diabetes  screening. This is done by checking your blood sugar (glucose) after you have not eaten for a while (fasting). You may have this done every 1-3 years. Mammogram. This may be done every 1-2 years. Talk to your health care provider about when you should start having regular mammograms. This may depend on whether you have a family history of breast cancer. BRCA-related cancer screening. This may be done if you have a family history of breast, ovarian, tubal, or peritoneal cancers. Pelvic exam and Pap test. This may be done every 3 years starting at age 89. Starting at age 69, this may be done every 5 years if you have a Pap test in combination with an HPV test. Bone density scan. This is done to screen for osteoporosis. You may have this scan if you are at high risk for osteoporosis. Discuss your test results, treatment options, and if necessary, the need for more tests with your health care provider. Vaccines  Your health care provider may recommend certain vaccines, such as: Influenza vaccine. This is recommended every year. Tetanus, diphtheria, and acellular pertussis (Tdap, Td) vaccine. You may need a Td booster every 10 years. Zoster vaccine. You may need this after age 45. Pneumococcal 13-valent conjugate (PCV13) vaccine. You may need this if you have certain conditions and were not previously vaccinated. Pneumococcal polysaccharide (PPSV23) vaccine. You may need one or two doses if you smoke cigarettes or if you have certain conditions. Talk to your health care provider about which screenings and vaccines you need and how often you need them. This information is not intended to replace advice given to you by your health care provider. Make sure you discuss any questions you have with your health care provider. Document Released: 10/03/2015 Document Revised: 05/26/2016 Document Reviewed: 07/08/2015 Elsevier Interactive Patient Education  2017 [place] Prevention in the  Home Falls can cause injuries. They can happen to people of all ages. There are many things you can do to make your home safe and to help prevent falls. What can I do on the outside of my home? Regularly fix the edges of walkways and driveways and fix any cracks. Remove anything that might make you trip as you walk through a door, such as a raised step or threshold. Trim any bushes or trees on the path to your home. Use bright outdoor lighting. Clear any walking paths of anything that might make someone trip, such as rocks or tools. Regularly check to see if handrails are loose or broken. Make sure that both sides of any steps have handrails. Any raised decks and porches should have guardrails on the edges. Have any leaves, snow, or ice cleared regularly. Use sand or salt on walking paths during winter. Clean up any spills in your garage right away. This includes oil or grease spills. What can I do in the bathroom? Use night lights. Install grab bars by the toilet and in the tub and shower. Do not use towel bars as grab bars. Use non-skid mats or decals in the tub or shower. If you need to sit down in the shower, use a plastic, non-slip stool. Keep the floor dry. Clean up any water that spills on the floor as soon as it happens. Remove soap buildup in the tub or shower regularly. Attach bath mats securely with double-sided non-slip rug tape. Do not have throw rugs and other  things on the floor that can make you trip. What can I do in the bedroom? Use night lights. Make sure that you have a light by your bed that is easy to reach. Do not use any sheets or blankets that are too big for your bed. They should not hang down onto the floor. Have a firm chair that has side arms. You can use this for support while you get dressed. Do not have throw rugs and other things on the floor that can make you trip. What can I do in the kitchen? Clean up any spills right away. Avoid walking on wet  floors. Keep items that you use a lot in easy-to-reach places. If you need to reach something above you, use a strong step stool that has a grab bar. Keep electrical cords out of the way. Do not use floor polish or wax that makes floors slippery. If you must use wax, use non-skid floor wax. Do not have throw rugs and other things on the floor that can make you trip. What can I do with my stairs? Do not leave any items on the stairs. Make sure that there are handrails on both sides of the stairs and use them. Fix handrails that are broken or loose. Make sure that handrails are as long as the stairways. Check any carpeting to make sure that it is firmly attached to the stairs. Fix any carpet that is loose or worn. Avoid having throw rugs at the top or bottom of the stairs. If you do have throw rugs, attach them to the floor with carpet tape. Make sure that you have a light switch at the top of the stairs and the bottom of the stairs. If you do not have them, ask someone to add them for you. What else can I do to help prevent falls? Wear shoes that: Do not have high heels. Have rubber bottoms. Are comfortable and fit you well. Are closed at the toe. Do not wear sandals. If you use a stepladder: Make sure that it is fully opened. Do not climb a closed stepladder. Make sure that both sides of the stepladder are locked into place. Ask someone to hold it for you, if possible. Clearly mark and make sure that you can see: Any grab bars or handrails. First and last steps. Where the edge of each step is. Use tools that help you move around (mobility aids) if they are needed. These include: Canes. Walkers. Scooters. Crutches. Turn on the lights when you go into a dark area. Replace any light bulbs as soon as they burn out. Set up your furniture so you have a clear path. Avoid moving your furniture around. If any of your floors are uneven, fix them. If there are any pets around you, be aware of  where they are. Review your medicines with your doctor. Some medicines can make you feel dizzy. This can increase your chance of falling. Ask your doctor what other things that you can do to help prevent falls. This information is not intended to replace advice given to you by your health care provider. Make sure you discuss any questions you have with your health care provider. Document Released: 07/03/2009 Document Revised: 02/12/2016 Document Reviewed: 10/11/2014 Elsevier Interactive Patient Education  2017 [employer].

## 2022-02-19 NOTE — Telephone Encounter (Signed)
Referral placed.   Baleigh Rennaker, FNP  

## 2022-02-19 NOTE — Patient Outreach (Signed)
[facility]) Care Management  02/19/2022  Ruth Hopkins 09-09-1966 [id number]   Received MD referral from pcp for LCSW for Anxiety. PCP has embedded LCSW sent [id number] to be assigned for follow up.  [place] Management Assistant [phone]

## 2022-02-19 NOTE — Telephone Encounter (Signed)
She has routine visits with psychiatrist, but feels like she needs a therapist to talk to - she cannot afford to pay copays for therapist at psych office. Can you please sent CCM referral to see if social worker can help with this? She is struggling with some anxiety and depression, husband talks down to her and makes her feel bad about herself; meds help some

## 2022-02-19 NOTE — Progress Notes (Signed)
Subjective:   Ruth Hopkins is a 56 y.o. female who presents for Medicare Annual (Subsequent) preventive examination.  Virtual Visit via Telephone Note  I connected with  Ruth Hopkins on 02/19/22 at 11:15 AM EDT by telephone and verified that I am speaking with the correct person using two identifiers.  Location: Patient: Home Provider: [facility] Persons participating in the virtual visit: patient/Nurse Health Advisor   I discussed the limitations, risks, security and privacy concerns of performing an evaluation and management service by telephone and the availability of in person appointments. The patient expressed understanding and agreed to proceed.  Interactive audio and video telecommunications were attempted between this nurse and patient, however failed, due to patient having technical difficulties OR patient did not have access to video capability.  We continued and completed visit with audio only.  Some vital signs may be absent or patient reported.   Mylinda Brook E Gustabo Gordillo, LPN   Review of Systems     Cardiac Risk Factors include: dyslipidemia;smoking/ tobacco exposure     Objective:    Today's Vitals   02/19/22 1116  Weight: 131 lb (59.4 kg)   Body mass index is 22.49 kg/m.     02/19/2022   11:25 AM 02/18/2021   11:32 AM  Advanced Directives  Does Patient Have a Medical Advance Directive? No No  Would patient like information on creating a medical advance directive? No - Patient declined No - Patient declined    Current Medications (verified) Outpatient Encounter Medications as of 02/19/2022  Medication Sig   acetaminophen (TYLENOL) 500 MG tablet Take by mouth.   buPROPion (WELLBUTRIN XL) 150 MG 24 hr tablet Take 1 tablet (150 mg total) by mouth daily.   clobetasol (OLUX) 0.05 % topical foam Apply topically 2 (two) times daily.   clonazePAM (KLONOPIN) 0.5 MG tablet Take 1 tablet (0.5 mg total) by mouth 2 (two) times daily.   cycloSPORINE (RESTASIS) 0.05 % ophthalmic  emulsion Restasis 0.05 % eye drops in a dropperette  USE 1 DROP INTO BOTH EYES TWICE A DAY   diclofenac (VOLTAREN) 75 MG EC tablet Take 1 tablet by mouth twice daily   eletriptan (RELPAX) 40 MG tablet TAKE 1 TABLET BY MOUTH EVERY 2 HOURS AS NEEDED FOR MIGRAINE, MAX 6 TABLETS PER DAY.   estradiol (VIVELLE-DOT) 0.075 MG/24HR Place 1 patch onto the skin 2 (two) times a week.   Fluocinolone Acetonide (FLAC) 0.01 % OIL Place 1 drop in ear(s) daily. Place 1-2 drop in right ear   imipramine (TOFRANIL) 10 MG tablet Take 3 tablets (30 mg total) by mouth at bedtime.   levothyroxine (SYNTHROID) 50 MCG tablet TAKE 1 TABLET BY MOUTH ONCE DAILY BEFORE BREAKFAST   meclizine (ANTIVERT) 50 MG tablet Take 1 tablet (50 mg total) by mouth 3 (three) times daily as needed.   methocarbamol (ROBAXIN) 500 MG tablet Take 1 tablet (500 mg total) by mouth every 6 (six) hours as needed for muscle spasms.   mometasone (NASONEX) 50 MCG/ACT nasal spray PLACE 2 SPRAYS INTO THE NOSE DAILY.   Multiple Vitamins-Minerals (MULTIVITAMIN PO) Take by mouth.   omeprazole (PRILOSEC) 20 MG capsule Take 1 capsule by mouth once daily   ondansetron (ZOFRAN) 4 MG tablet Take 1 tablet (4 mg total) by mouth every 8 (eight) hours as needed for nausea or vomiting.   tacrolimus (PROTOPIC) 0.1 % ointment tacrolimus 0.1 % topical ointment  APPLY OINTMENT TOPICALLY TO AFFECTED AREA TWICE DAILY   topiramate (TOPAMAX) 25 MG tablet Take  1 tablet (25 mg total) by mouth 2 (two) times daily.   valACYclovir (VALTREX) 500 MG tablet Take 500 mg by mouth daily as needed.   No facility-administered encounter medications on file as of 02/19/2022.    Allergies (verified) Latex, Codeine, Gabapentin, and Sulfa antibiotics   History: Past Medical History:  Diagnosis Date   Chronic fatigue    Depression    Fibromyalgia    IBS (irritable bowel syndrome)    Migraine    Thyroid disease    Past Surgical History:  Procedure Laterality Date   BREAST SURGERY  Right 2007   lumpectomy   FOOT SURGERY     Family History  Problem Relation Age of Onset   Asthma Mother    Hypothyroidism Mother    Hypertension Mother    Raynaud syndrome Mother    Hypertension Father    Social History   Socioeconomic History   Marital status: Married    Spouse name: Not on file   Number of children: 0   Years of education: Not on file   Highest education level: Not on file  Occupational History   Occupation: disability  Tobacco Use   Smoking status: Every Day    Packs/day: 1.00    Years: 35.00    Pack years: 35.00    Types: Cigarettes   Smokeless tobacco: Never   Tobacco comments:    Smoking since age 54 - 1ppd usually - has tried to quit several times - is afraid it will make her gain weight  Vaping Use   Vaping Use: Never used  Substance and Sexual Activity   Alcohol use: No   Drug use: No   Sexual activity: Not on file  Other Topics Concern   Not on file  Social History Narrative   Lives home with husband in one level home   Her mother and sister live nearby   [place]   Social Determinants of Health   Financial Resource Strain: Low Risk    Difficulty of Paying Living Expenses: Not very hard  Food Insecurity: No Food Insecurity   Worried About Charity fundraiser in the Last Year: Never true   [place] in the Last Year: Never true  Transportation Needs: No Transportation Needs   Lack of Transportation (Medical): No   Lack of Transportation (Non-Medical): No  Physical Activity: Sufficiently Active   Days of Exercise per Week: 5 days   Minutes of Exercise per Session: 30 min  Stress: Stress Concern Present   Feeling of Stress : To some extent  Social Connections: Moderately Isolated   Frequency of Communication with Friends and Family: More than three times a week   Frequency of Social Gatherings with Friends and Family: More than three times a week   Attends Religious Services: Never   Marine scientist or  Organizations: No   Attends Music therapist: Never   Marital Status: Married    Tobacco Counseling Ready to quit: Yes Counseling given: Yes Tobacco comments: Smoking since age 34 - 1ppd usually - has tried to quit several times - is afraid it will make her gain weight   Clinical Intake:  Pre-visit preparation completed: Yes  Pain : No/denies pain     BMI - recorded: 22.49 Nutritional Status: BMI of 19-24  Normal Nutritional Risks: None Diabetes: No  How often do you need to have someone help you when you read instructions, pamphlets, or other written materials from your  doctor or pharmacy?: 1 - Never  Diabetic? no  Interpreter Needed?: No  Information entered by :: Ruth Mcleary, LPN   Activities of Daily Living    02/19/2022   11:26 AM  In your present state of health, do you have any difficulty performing the following activities:  Hearing? 0  Vision? 0  Difficulty concentrating or making decisions? 1  Walking or climbing stairs? 0  Dressing or bathing? 0  Doing errands, shopping? 0  Preparing Food and eating ? N  Using the Toilet? N  In the past six months, have you accidently leaked urine? N  Do you have problems with loss of bowel control? N  Managing your Medications? N  Managing your Finances? N  Housekeeping or managing your Housekeeping? N    Patient Care Team: Sharion Balloon, FNP as PCP - General (Family Medicine) Molli Posey, MD as Consulting Physician (Obstetrics and Gynecology) Lavonna Monarch, MD as Consulting Physician (Dermatology) Suella Broad, MD as Consulting Physician (Physical Medicine and Rehabilitation) Lowell Guitar (Hematology and Oncology) Noemi Chapel, NP as Nurse Practitioner (Psychology) Regal, Tamala Fothergill, DPM as Consulting Physician (Podiatry) Ilean China, RN as Case Manager  Indicate any recent Medical Services you may have received from other than [facility] providers in the past year (date may be  approximate).     Assessment:   This is a routine wellness examination for Ruth Hopkins.  Hearing/Vision screen Hearing Screening - Comments:: Denies hearing difficulties   Vision Screening - Comments:: Wears reading glasses prn- up to date with routine eye exams with [facility]  Dietary issues and exercise activities discussed: Current Exercise Habits: Home exercise routine, Type of exercise: walking;stretching, Time (Minutes): 30, Frequency (Times/Week): 5, Weekly Exercise (Minutes/Week): 150, Intensity: Mild, Exercise limited by: psychological condition(s)   Goals Addressed             This Visit's Progress    LIFESTYLE - ATTEND STRESS MANAGEMENT CLASSES       CCM referral sent     Quit Smoking   Not on track    Back up to 1 ppd       Depression Screen    02/19/2022   11:21 AM 01/18/2022   11:38 AM 09/08/2021    2:24 PM 05/14/2021   11:39 AM 04/17/2021   12:38 PM 02/18/2021   11:28 AM 07/24/2020   12:28 PM  PHQ 2/9 Scores  PHQ - 2 Score 3 1 0 0 0 1 1  PHQ- 9 Score $Remov'6 2 2 2   4    'FOaTAA$ Fall Risk    02/19/2022   11:19 AM 01/18/2022   11:38 AM 09/08/2021    2:24 PM 02/18/2021   11:36 AM 01/21/2021   11:25 AM  Fall Risk   Falls in the past year? 0 0 1 0 0  Number falls in past yr: 0 0 0 0   Injury with Fall? 0 0 0 0   Risk for fall due to : Orthopedic patient No Fall Risks History of fall(s) Orthopedic patient;Medication side effect   Follow up Falls prevention discussed Falls evaluation completed Falls evaluation completed Falls prevention discussed     FALL RISK PREVENTION PERTAINING TO THE HOME:  Any stairs in or around the home? No  If so, are there any without handrails? No  Home free of loose throw rugs in walkways, pet beds, electrical cords, etc? Yes  Adequate lighting in your home to reduce risk of falls? Yes   ASSISTIVE DEVICES  UTILIZED TO PREVENT FALLS:  Life alert? No  Use of a cane, walker or w/c? No  Grab bars in the bathroom? No  Shower chair or bench in  shower? No  Elevated toilet seat or a handicapped toilet? No   TIMED UP AND GO:  Was the test performed? No . Telephonic visit  Cognitive Function:        02/19/2022   11:26 AM  6CIT Screen  What Year? 0 points  What month? 0 points  What time? 0 points  Count back from 20 0 points  Months in reverse 0 points  Repeat phrase 0 points  Total Score 0 points    Immunizations Immunization History  Administered Date(s) Administered   Influenza Inj Mdck Quad With Preservative 06/26/2019   Influenza,inj,Quad PF,6+ Mos 08/05/2018, 07/24/2020   MMR 07/07/2000   Td 05/11/2021   Tdap 04/14/2000    TDAP status: Up to date  Flu Vaccine status: Up to date  Pneumococcal vaccine status: Declined,  Education has been provided regarding the importance of this vaccine but patient still declined. Advised may receive this vaccine at local pharmacy or Health Dept. Aware to provide a copy of the vaccination record if obtained from local pharmacy or Health Dept. Verbalized acceptance and understanding.   Covid-19 vaccine status: Declined, Education has been provided regarding the importance of this vaccine but patient still declined. Advised may receive this vaccine at local pharmacy or Health Dept.or vaccine clinic. Aware to provide a copy of the vaccination record if obtained from local pharmacy or Health Dept. Verbalized acceptance and understanding.  Qualifies for Shingles Vaccine? Yes   Zostavax completed No   Shingrix Completed?: No.    Education has been provided regarding the importance of this vaccine. Patient has been advised to call insurance company to determine out of pocket expense if they have not yet received this vaccine. Advised may also receive vaccine at local pharmacy or Health Dept. Verbalized acceptance and understanding.  Screening Tests Health Maintenance  Topic Date Due   COVID-19 Vaccine (1) Never done   Zoster Vaccines- Shingrix (1 of 2) Never done   PAP  SMEAR-Modifier  06/15/2021   MAMMOGRAM  10/30/2021   INFLUENZA VACCINE  04/20/2022   COLONOSCOPY (Pts 45-32yrs Insurance coverage will need to be confirmed)  01/11/2023   TETANUS/TDAP  05/12/2031   Hepatitis C Screening  Completed   HIV Screening  Completed   HPV VACCINES  Aged Out    Health Maintenance  Health Maintenance Due  Topic Date Due   COVID-19 Vaccine (1) Never done   Zoster Vaccines- Shingrix (1 of 2) Never done   PAP SMEAR-Modifier  06/15/2021   MAMMOGRAM  10/30/2021    Colorectal cancer screening: Type of screening: Colonoscopy. Completed 01/10/2018. Repeat every 5 years  Mammogram status: Completed 09/17/2021. Repeat every year   Lung Cancer Screening: (Low Dose CT Chest recommended if Age 61-80 years, 30 pack-year currently smoking OR have quit w/in 15years.) does qualify.   Lung Cancer Screening Referral: discuss with pcp  Additional Screening:  Hepatitis C Screening: does qualify; Completed 01/21/2021  Vision Screening: Recommended annual ophthalmology exams for early detection of glaucoma and other disorders of the eye. Is the patient up to date with their annual eye exam?  Yes  Who is the provider or what is the name of the office in which the patient attends annual eye exams? [facility] If pt is not established with a provider, would they like to be referred to  a provider to establish care? No .   Dental Screening: Recommended annual dental exams for proper oral hygiene  Community Resource Referral / Chronic Care Management: CRR required this visit?  No   CCM required this visit?  No      Plan:     I have personally reviewed and noted the following in the patient's chart:   Medical and social history Use of alcohol, tobacco or illicit drugs  Current medications and supplements including opioid prescriptions.  Functional ability and status Nutritional status Physical activity Advanced directives List of other  physicians Hospitalizations, surgeries, and ER visits in previous 12 months Vitals Screenings to include cognitive, depression, and falls Referrals and appointments  In addition, I have reviewed and discussed with patient certain preventive protocols, quality metrics, and best practice recommendations. A written personalized care plan for preventive services as well as general preventive health recommendations were provided to patient.     Sandrea Hammond, LPN   12/21/7188   Nurse Notes: Separate phone message sent to pcp to send CCM referral - patient cannot afford to talk to therapist at her psych office - has a $50 copay per visit - she is struggling with some anxiety and depression, husband talks down to her and makes her feel bad about herself. Also, she qualifies for a low dose chest ct lung cancer screening - will need consult with pcp first

## 2022-02-19 NOTE — Addendum Note (Signed)
Addended by: Evelina Dun A on: 02/19/2022 01:36 PM   Modules accepted: Orders

## 2022-02-23 ENCOUNTER — Encounter: Payer: Medicare HMO | Admitting: *Deleted

## 2022-03-09 DIAGNOSIS — Z86000 Personal history of in-situ neoplasm of breast: Secondary | ICD-10-CM | POA: Diagnosis not present

## 2022-03-09 DIAGNOSIS — Z9189 Other specified personal risk factors, not elsewhere classified: Secondary | ICD-10-CM | POA: Diagnosis not present

## 2022-03-09 DIAGNOSIS — R922 Inconclusive mammogram: Secondary | ICD-10-CM | POA: Diagnosis not present

## 2022-03-12 ENCOUNTER — Ambulatory Visit: Payer: Medicare HMO | Admitting: Podiatry

## 2022-03-16 ENCOUNTER — Telehealth: Payer: Self-pay | Admitting: *Deleted

## 2022-03-16 NOTE — Telephone Encounter (Signed)
Patient is wanting to know if she will be able to get another injection (capsulitis)on upcoming appointment or will it be too soon? Please advise.

## 2022-03-18 ENCOUNTER — Other Ambulatory Visit: Payer: Self-pay | Admitting: Family

## 2022-03-18 ENCOUNTER — Ambulatory Visit: Payer: Medicare HMO | Admitting: Podiatry

## 2022-03-18 DIAGNOSIS — M5442 Lumbago with sciatica, left side: Secondary | ICD-10-CM

## 2022-03-18 DIAGNOSIS — F330 Major depressive disorder, recurrent, mild: Secondary | ICD-10-CM | POA: Diagnosis not present

## 2022-03-18 DIAGNOSIS — R69 Illness, unspecified: Secondary | ICD-10-CM | POA: Diagnosis not present

## 2022-03-18 DIAGNOSIS — F411 Generalized anxiety disorder: Secondary | ICD-10-CM | POA: Diagnosis not present

## 2022-03-24 DIAGNOSIS — M25561 Pain in right knee: Secondary | ICD-10-CM | POA: Diagnosis not present

## 2022-03-29 ENCOUNTER — Ambulatory Visit: Payer: Medicare HMO | Admitting: Podiatry

## 2022-04-01 ENCOUNTER — Encounter: Payer: Self-pay | Admitting: Dermatology

## 2022-04-01 ENCOUNTER — Ambulatory Visit: Payer: Medicare HMO | Admitting: Dermatology

## 2022-04-01 DIAGNOSIS — I781 Nevus, non-neoplastic: Secondary | ICD-10-CM

## 2022-04-01 DIAGNOSIS — L570 Actinic keratosis: Secondary | ICD-10-CM | POA: Diagnosis not present

## 2022-04-01 NOTE — Patient Instructions (Addendum)
For laser on cheek [facility] dermatologist- Allyson Sabal

## 2022-04-06 ENCOUNTER — Encounter: Payer: Self-pay | Admitting: Family

## 2022-04-06 ENCOUNTER — Ambulatory Visit (INDEPENDENT_AMBULATORY_CARE_PROVIDER_SITE_OTHER): Payer: Medicare HMO | Admitting: Family

## 2022-04-06 DIAGNOSIS — R350 Frequency of micturition: Secondary | ICD-10-CM | POA: Diagnosis not present

## 2022-04-06 DIAGNOSIS — R531 Weakness: Secondary | ICD-10-CM | POA: Diagnosis not present

## 2022-04-06 NOTE — Progress Notes (Signed)
Virtual Visit  Note Due to COVID-19 pandemic this visit was conducted virtually. This visit type was conducted due to national recommendations for restrictions regarding the COVID-19 Pandemic (e.g. social distancing, sheltering in place) in an effort to limit this patient's exposure and mitigate transmission in our community. All issues noted in this document were discussed and addressed.  A physical exam was not performed with this format.  I connected with Ruth Hopkins on 04/06/22 at 11:47 AM by telephone and verified that I am speaking with the correct person using two identifiers. Ruth Hopkins is currently located at home and no one is currently with her during visit. The provider, Evelina Dun, FNP is located in their office at time of visit.  I discussed the limitations, risks, security and privacy concerns of performing an evaluation and management service by telephone and the availability of in person appointments. I also discussed with the patient that there may be a patient responsible charge related to this service. The patient expressed understanding and agreed to proceed.  Ruth Hopkins are scheduled for a virtual visit with your provider today.    Just as we do with appointments in the office, we must obtain your consent to participate.  Your consent will be active for this visit and any virtual visit you may have with one of our providers in the next 365 days.    If you have a MyChart account, I can also send a copy of this consent to you electronically.  All virtual visits are billed to your insurance company just like a traditional visit in the office.  As this is a virtual visit, video technology does not allow for your provider to perform a traditional examination.  This may limit your provider's ability to fully assess your condition.  If your provider identifies any concerns that need to be evaluated in person or the need to arrange testing such as labs, EKG, etc, we will make  arrangements to do so.    Although advances in technology are sophisticated, we cannot ensure that it will always work on either your end or our end.  If the connection with a video visit is poor, we may have to switch to a telephone visit.  With either a video or telephone visit, we are not always able to ensure that we have a secure connection.   I need to obtain your verbal consent now.   Are you willing to proceed with your visit today?   Ruth Hopkins has provided verbal consent on 04/06/2022 for a virtual visit (video or telephone).   Evelina Dun, [place] 04/06/2022  11:48 AM    History and Present Illness:  PT calls the office today with feeling weak and states this is how she gets when she has a UTI. She is having urinary frequency, but states she drinks a lot of water.  Urinary Frequency  This is a recurrent problem. The current episode started in the past 7 days. The problem occurs intermittently. The problem has been waxing and waning. The pain is at a severity of 0/10. The patient is experiencing no pain. Associated symptoms include frequency and urgency. Pertinent negatives include no chills, discharge, flank pain, hematuria, hesitancy, nausea or vomiting. She has tried increased fluids for the symptoms. The treatment provided moderate relief.     Review of Systems  Constitutional:  Negative for chills.  Gastrointestinal:  Negative for nausea and vomiting.  Genitourinary:  Positive for frequency and urgency. Negative for  flank pain, hematuria and hesitancy.  All other systems reviewed and are negative.    Observations/Objective: No SOB or distress noted   Assessment and Plan: 1. Weakness - Urinalysis, Complete - Urine Culture  2. Urinary frequency - Urinalysis, Complete - Urine Culture   Force fluids PT will come to office to have urine tested Follow up if symptoms worsen or do not improve    I discussed the assessment and treatment plan with the patient. The  patient was provided an opportunity to ask questions and all were answered. The patient agreed with the plan and demonstrated an understanding of the instructions.   The patient was advised to call back or seek an in-person evaluation if the symptoms worsen or if the condition fails to improve as anticipated.  The above assessment and management plan was discussed with the patient. The patient verbalized understanding of and has agreed to the management plan. Patient is aware to call the clinic if symptoms persist or worsen. Patient is aware when to return to the clinic for a follow-up visit. Patient educated on when it is appropriate to go to the emergency department.   Time call ended:  11:58 AM   I provided 11 minutes of  non face-to-face time during this encounter.    Evelina Dun, FNP

## 2022-04-07 LAB — URINALYSIS, COMPLETE
Bilirubin, UA: NEGATIVE
Glucose, UA: NEGATIVE
Leukocytes,UA: NEGATIVE
Nitrite, UA: NEGATIVE
Protein,UA: NEGATIVE
Specific Gravity, UA: 1.015 (ref 1.005–1.030)
Urobilinogen, Ur: 0.2 mg/dL (ref 0.2–1.0)
pH, UA: 6 (ref 5.0–7.5)

## 2022-04-07 LAB — URINE CULTURE: Organism ID, Bacteria: NO GROWTH

## 2022-04-08 NOTE — Progress Notes (Signed)
Patient returning call. Please call back

## 2022-04-08 NOTE — Progress Notes (Signed)
Patient calling back, please return call.

## 2022-04-09 ENCOUNTER — Encounter: Payer: Self-pay | Admitting: Family

## 2022-04-09 ENCOUNTER — Other Ambulatory Visit: Payer: Self-pay | Admitting: Family

## 2022-04-09 ENCOUNTER — Ambulatory Visit (INDEPENDENT_AMBULATORY_CARE_PROVIDER_SITE_OTHER): Payer: Medicare HMO | Admitting: Family

## 2022-04-09 VITALS — BP 96/64 | HR 87 | Temp 98.0°F | Ht 64.0 in | Wt 125.4 lb

## 2022-04-09 DIAGNOSIS — R300 Dysuria: Secondary | ICD-10-CM

## 2022-04-09 DIAGNOSIS — R311 Benign essential microscopic hematuria: Secondary | ICD-10-CM

## 2022-04-09 LAB — URINALYSIS, COMPLETE
Bilirubin, UA: NEGATIVE
Glucose, UA: NEGATIVE
Ketones, UA: NEGATIVE
Leukocytes,UA: NEGATIVE
Nitrite, UA: NEGATIVE
Protein,UA: NEGATIVE
Specific Gravity, UA: <1.005 — ABNORMAL LOW (ref 1.005–1.030)
Urobilinogen, Ur: 0.2 mg/dL (ref 0.2–1.0)
pH, UA: 6 (ref 5.0–7.5)

## 2022-04-09 LAB — MICROSCOPIC EXAMINATION
Bacteria, UA: NONE SEEN
WBC, UA: NONE SEEN /hpf (ref 0–5)

## 2022-04-09 NOTE — Progress Notes (Signed)
   Subjective:    Patient ID: Ruth Hopkins, female    DOB: Aug 10, 1966, 56 y.o.   MRN: [id number]  Chief Complaint  Patient presents with   Urinary Tract Infection   Pt presents to the office today with recurrent dysuria and urinary frequency. Had symptoms on 04/06/22 and had a negative urine culture. We placed referral to Urologists.  Dysuria  This is a recurrent problem. The current episode started in the past 7 days. The problem occurs intermittently. The quality of the pain is described as burning. Associated symptoms include frequency and urgency. Pertinent negatives include no hematuria, nausea or vomiting. She has tried increased fluids for the symptoms. The treatment provided mild relief.      Review of Systems  Gastrointestinal:  Negative for nausea and vomiting.  Genitourinary:  Positive for dysuria, frequency and urgency. Negative for hematuria.  All other systems reviewed and are negative.      Objective:   Physical Exam Vitals reviewed.  Constitutional:      General: She is not in acute distress.    Appearance: She is well-developed.  HENT:     Head: Normocephalic and atraumatic.  Eyes:     Pupils: Pupils are equal, round, and reactive to light.  Neck:     Thyroid: No thyromegaly.  Cardiovascular:     Rate and Rhythm: Normal rate and regular rhythm.     Heart sounds: Normal heart sounds. No murmur heard. Pulmonary:     Effort: Pulmonary effort is normal. No respiratory distress.     Breath sounds: Normal breath sounds. No wheezing.  Abdominal:     General: Bowel sounds are normal. There is no distension.     Palpations: Abdomen is soft.     Tenderness: There is no abdominal tenderness.  Musculoskeletal:        General: No tenderness. Normal range of motion.     Cervical back: Normal range of motion and neck supple.  Skin:    General: Skin is warm and dry.  Neurological:     Mental Status: She is alert and oriented to person, place, and time.     Cranial  Nerves: No cranial nerve deficit.     Deep Tendon Reflexes: Reflexes are normal and symmetric.  Psychiatric:        Behavior: Behavior normal.        Thought Content: Thought content normal.        Judgment: Judgment normal.       BP 96/64   Pulse 87   Temp 98 F (36.7 C)   Ht '5\' 4"'$  (1.626 m)   Wt 125 lb 6.4 oz (56.9 kg)   SpO2 96%   BMI 21.52 kg/m      Assessment & Plan:  Ruth Hopkins comes in today with chief complaint of Urinary Tract Infection   Diagnosis and orders addressed:  1. Dysuria Discussed urine negative More than likely interstitial cystitis, needs to follow up with Urologists  Urologists pending  - Urine Culture - Urinalysis, Complete   Evelina Dun, FNP

## 2022-04-09 NOTE — Patient Instructions (Signed)
Interstitial Cystitis  Interstitial cystitis is inflammation of the bladder. This condition is also known as painful bladder syndrome. This may cause pain in the bladder area as well as a frequent and urgent need to urinate. The bladder is an organ that stores urine after the urine is made in the kidneys. The severity of interstitial cystitis can vary from person to person. You may have flare-ups, and then your symptoms may go away for a while. For many people, it becomes a long-term (chronic) problem. What are the causes? The cause of this condition is not known. What increases the risk? The following factors may make you more likely to develop this condition: Being female. Having fibromyalgia. Having irritable bowel syndrome (IBS). Having endometriosis. Having chronic fatigue syndrome. This condition may be aggravated by: Stress. Smoking. Spicy foods. What are the signs or symptoms? Symptoms of interstitial cystitis vary, and they can change over time. Symptoms may include: Discomfort or pain in the bladder area, which is in the lower abdomen. Pain can range from mild to severe. The pain may change in intensity as the bladder fills with urine or as it empties. Pain in the pelvic area, between the hip bones. A constant urge to urinate. Frequent urination. Pain during urination. Pain during sex. Blood in the urine. Feeling tired (fatigue). For women, symptoms often get worse during menstruation. How is this diagnosed? This condition is diagnosed based on your symptoms, your medical history, and a physical exam. Your health care provider may need to rule out other conditions and may order other tests, such as: Urine tests. Cystoscopy. For this test, a tool similar to a very thin telescope is used to look into your bladder. Biopsy. This involves taking a sample of tissue from the bladder to be examined under a microscope. How is this treated? There is no cure for this condition, but  treatment can help you control your symptoms. Work closely with your health care provider to find the most effective treatments for you. Treatment options may include: Medicines to relieve pain and reduce how often you feel the need to urinate. This treatment may include: A procedure where a small amount of medicine that eases irritation is put inside your bladder through a catheter (bladder instillation). Lifestyle changes, such as changing your diet or taking steps to control stress. Physical therapy. This may include: Exercises to help relax the pelvic floor muscles. Massage to relax tight muscles (myofascial release). Learning ways to control when you urinate (bladder training). Using a device that provides electrical stimulation to your nerves, which can relieve pain (neuromodulation therapy). The device is placed on your back, where it blocks the nerves that cause you to feel pain in your bladder area. A procedure that stretches your bladder by filling it with air or fluid (hydrodistention). Surgery. This is rare. It is only done for extreme cases, if other treatments do not help. Follow these instructions at home: Lifestyle Learn and practice relaxation techniques, such as deep breathing and muscle relaxation. Get care for your body and mental well-being, such as: Cognitive behavioral therapy (CBT). This therapy changes the way you think or act in response to different situations. This may improve how you feel. Seeing a mental health therapist to evaluate and treat depression, if necessary. Work with your health care provider on other ways to manage pain. Acupuncture may be helpful. Avoid drinking alcohol. Do not use any products that contain nicotine or tobacco. These products include cigarettes, chewing tobacco, and vaping devices, such   as e-cigarettes. If you need help quitting, ask your health care provider. Eating and drinking Make dietary changes as recommended by your health care  provider. You may need to avoid: Spicy foods. Foods that contain a lot of potassium. Limit your intake of drinks that increase your urge to urinate. These include alcohol and caffeinated drinks like soda, coffee, and tea. Bladder training  Use bladder training techniques as directed. Techniques may include: Urinating at scheduled times. Training yourself to delay urination. Keep a bladder diary. Write down the times you urinate and any symptoms that you have. This can help you find out which foods, liquids, or activities make your symptoms worse. Use your bladder diary to schedule bathroom trips. If you are away from home, plan to be near a bathroom at each of your scheduled times. Make sure that you urinate just before you leave the house and just before you go to bed. General instructions Take over-the-counter and prescription medicines only as told by your health care provider. Try a warm or cool compress over your bladder for comfort. Avoid wearing tight clothing. Do exercises to relax your pelvic floor muscles as told by your physical therapist. Keep all follow-up visits. This is important. Where to find more information To find more information or a support group near you, visit: [employer] Foundation: urologyhealth.org Interstitial Cystitis Association: ichelp.org Contact a health care provider if you have: Symptoms that do not get better with treatment. Pain or discomfort that gets worse. More frequent urges to urinate. A fever. Get help right away if: You have no control over when you urinate. Summary Interstitial cystitis is inflammation of the bladder. This condition may cause pain in the bladder area as well as a frequent and urgent need to urinate. You may have flare-ups of the condition, and then it may go away for a while. For many people, it becomes a long-term (chronic) problem. There is no cure for interstitial cystitis, but treatment methods are available to  control your symptoms. This information is not intended to replace advice given to you by your health care provider. Make sure you discuss any questions you have with your health care provider. Document Revised: 04/11/2020 Document Reviewed: 04/11/2020 Elsevier Patient Education  2023 Elsevier Inc.  

## 2022-04-11 LAB — URINE CULTURE: Organism ID, Bacteria: NO GROWTH

## 2022-04-13 ENCOUNTER — Telehealth: Payer: Self-pay | Admitting: Family

## 2022-04-13 ENCOUNTER — Telehealth: Payer: Self-pay

## 2022-04-13 NOTE — Telephone Encounter (Signed)
Ruth Hopkins has placed a referral for urologist.  Patient prefers to see Dr. Diona Fanti in [place].

## 2022-04-13 NOTE — Telephone Encounter (Signed)
Patient aware of results of urinalysis and culture.

## 2022-04-15 ENCOUNTER — Ambulatory Visit: Payer: Self-pay | Admitting: *Deleted

## 2022-04-15 NOTE — Patient Instructions (Signed)
[name]  I have either worked with you through the Chronic Care Management Program at [facility] or I was listed on your Care Team at some point within the last 4 years. Due to program changes I am removing myself from your care team because you've either met our goals, your conditions are stable, you no longer require care management, we haven't engaged within the past 6 months, or we have never worked together through the [facility].   If you are currently active with another CCM Team Member, you will remain active with them unless they reach out to you with additional information.   If you feel that you need services in the future,  please talk with your primary care provider and request a new referral for Care Management or Care Coordination. This does not affect your status as a patient at [facility].   Thank you for allowing me to participate in your your healthcare journey.  Chong Sicilian, BSN, RN-BC Embedded Chronic Care Manager [facility] Direct Dial: [phone]

## 2022-04-15 NOTE — Chronic Care Management (AMB) (Signed)
  Chronic Care Management   Note  04/15/2022 Name: Ruth Hopkins MRN: [id number] DOB: [birth year]   Patient has either met RN Care Management goals, is stable from [facility] Management perspective, or has not recently engaged with the RN Care Manager. I am removing RN Care Manager from Care Team and closing [place]. Patient is not currently engaged with another CCM team member. If they are engaged with another CCM Team Member, I will forward this case closure encounter to them. Patient does not have a current CCM referral placed since 01/18/22. CCM enrollment status was accurate and was not changed.  Their PCP can place a new referral if the patient needs Care Management or Care Coordination services in the future.  Chong Sicilian, BSN, RN-BC Embedded Chronic Care Manager [facility] Direct Dial: [phone]

## 2022-04-15 NOTE — Chronic Care Management (AMB) (Signed)
Erroneous encounter. Please disregard.

## 2022-04-22 ENCOUNTER — Ambulatory Visit (INDEPENDENT_AMBULATORY_CARE_PROVIDER_SITE_OTHER): Payer: Medicare HMO | Admitting: Family

## 2022-04-22 ENCOUNTER — Encounter: Payer: Self-pay | Admitting: Family

## 2022-04-22 VITALS — BP 108/72 | HR 75 | Temp 98.0°F | Ht 64.0 in | Wt 125.6 lb

## 2022-04-22 DIAGNOSIS — R300 Dysuria: Secondary | ICD-10-CM

## 2022-04-22 DIAGNOSIS — N3010 Interstitial cystitis (chronic) without hematuria: Secondary | ICD-10-CM

## 2022-04-22 DIAGNOSIS — R350 Frequency of micturition: Secondary | ICD-10-CM

## 2022-04-22 LAB — MICROSCOPIC EXAMINATION
Bacteria, UA: NONE SEEN
RBC, Urine: NONE SEEN /hpf (ref 0–2)
Renal Epithel, UA: NONE SEEN /hpf
WBC, UA: NONE SEEN /hpf (ref 0–5)

## 2022-04-22 LAB — URINALYSIS, COMPLETE
Bilirubin, UA: NEGATIVE
Glucose, UA: NEGATIVE
Ketones, UA: NEGATIVE
Leukocytes,UA: NEGATIVE
Nitrite, UA: NEGATIVE
Protein,UA: NEGATIVE
Specific Gravity, UA: 1.01 (ref 1.005–1.030)
Urobilinogen, Ur: 0.2 mg/dL (ref 0.2–1.0)
pH, UA: 7 (ref 5.0–7.5)

## 2022-04-22 MED ORDER — ELMIRON 100 MG PO CAPS: 100.0000 mg | ORAL_CAPSULE | Freq: Three times a day (TID) | ORAL | 1 refills | Status: DC

## 2022-04-22 MED ORDER — CEPHALEXIN 500 MG PO CAPS: 500.0000 mg | ORAL_CAPSULE | Freq: Two times a day (BID) | ORAL | 0 refills | Status: DC

## 2022-04-22 NOTE — Addendum Note (Signed)
Addended by: Ladean Raya on: 04/22/2022 04:32 PM   Modules accepted: Orders

## 2022-04-22 NOTE — Progress Notes (Signed)
Subjective:    Patient ID: Ruth Hopkins, female    DOB: [birth year], 56 y.o.   MRN: [id number]  Chief Complaint  Patient presents with   Urinary Tract Infection   PT presents to the office today with recurrent urinary frequency. Reports yesterday she felt like she had to pee every 10 mins. She has had her urine checked multiple times over the last month that have been negative. We have placed a referral to Urologists but this is still pending.   We have discussed with her that her symptoms are more than likely interstitial cystitis.  Urinary Frequency  This is a recurrent problem. The current episode started 1 to 4 weeks ago. The problem occurs intermittently. The problem has been waxing and waning. The quality of the pain is described as burning. The pain is mild. Associated symptoms include frequency and urgency. Pertinent negatives include no hematuria, nausea or vomiting. She has tried increased fluids for the symptoms. The treatment provided mild relief.      Review of Systems  Gastrointestinal:  Negative for nausea and vomiting.  Genitourinary:  Positive for frequency and urgency. Negative for hematuria.  All other systems reviewed and are negative.      Objective:   Physical Exam Vitals reviewed.  Constitutional:      General: She is not in acute distress.    Appearance: She is well-developed.  HENT:     Head: Normocephalic and atraumatic.  Eyes:     Pupils: Pupils are equal, round, and reactive to light.  Neck:     Thyroid: No thyromegaly.  Cardiovascular:     Rate and Rhythm: Normal rate and regular rhythm.     Heart sounds: Normal heart sounds. No murmur heard. Pulmonary:     Effort: Pulmonary effort is normal. No respiratory distress.     Breath sounds: Normal breath sounds. No wheezing.  Abdominal:     General: Bowel sounds are normal. There is no distension.     Palpations: Abdomen is soft.     Tenderness: There is no abdominal tenderness.  Musculoskeletal:         General: No tenderness. Normal range of motion.     Cervical back: Normal range of motion and neck supple.  Skin:    General: Skin is warm and dry.  Neurological:     Mental Status: She is alert and oriented to person, place, and time.     Cranial Nerves: No cranial nerve deficit.     Deep Tendon Reflexes: Reflexes are normal and symmetric.  Psychiatric:        Behavior: Behavior normal.        Thought Content: Thought content normal.        Judgment: Judgment normal.      BP 108/72   Pulse 75   Temp 98 F (36.7 C) (Temporal)   Ht '5\' 4"'$  (1.626 m)   Wt 125 lb 9.6 oz (57 kg)   BMI 21.56 kg/m       Assessment & Plan:  Ruth Hopkins comes in today with chief complaint of Urinary Tract Infection   Diagnosis and orders addressed:  1. Dysuria - Urine Culture - Urinalysis, Complete - pentosan polysulfate (ELMIRON) 100 MG capsule; Take 1 capsule (100 mg total) by mouth 3 (three) times daily.  Dispense: 90 capsule; Refill: 1  2. Urinary frequency  3. Interstitial cystitis  - pentosan polysulfate (ELMIRON) 100 MG capsule; Take 1 capsule (100 mg total) by mouth 3 (three)  times daily.  Dispense: 90 capsule; Refill: 1   Discussed in length that her symptoms are more than likely from interstitial cystitis.  Recommend quitting smoking  Needs to follow up with Urologists  Start Emiron TID  AZO as needed  Force fluids    Ruth Dun, FNP

## 2022-04-22 NOTE — Patient Instructions (Signed)
Interstitial Cystitis  Interstitial cystitis is inflammation of the bladder. This condition is also known as painful bladder syndrome. This may cause pain in the bladder area as well as a frequent and urgent need to urinate. The bladder is an organ that stores urine after the urine is made in the kidneys. The severity of interstitial cystitis can vary from person to person. You may have flare-ups, and then your symptoms may go away for a while. For many people, it becomes a long-term (chronic) problem. What are the causes? The cause of this condition is not known. What increases the risk? The following factors may make you more likely to develop this condition: Being female. Having fibromyalgia. Having irritable bowel syndrome (IBS). Having endometriosis. Having chronic fatigue syndrome. This condition may be aggravated by: Stress. Smoking. Spicy foods. What are the signs or symptoms? Symptoms of interstitial cystitis vary, and they can change over time. Symptoms may include: Discomfort or pain in the bladder area, which is in the lower abdomen. Pain can range from mild to severe. The pain may change in intensity as the bladder fills with urine or as it empties. Pain in the pelvic area, between the hip bones. A constant urge to urinate. Frequent urination. Pain during urination. Pain during sex. Blood in the urine. Feeling tired (fatigue). For women, symptoms often get worse during menstruation. How is this diagnosed? This condition is diagnosed based on your symptoms, your medical history, and a physical exam. Your health care provider may need to rule out other conditions and may order other tests, such as: Urine tests. Cystoscopy. For this test, a tool similar to a very thin telescope is used to look into your bladder. Biopsy. This involves taking a sample of tissue from the bladder to be examined under a microscope. How is this treated? There is no cure for this condition, but  treatment can help you control your symptoms. Work closely with your health care provider to find the most effective treatments for you. Treatment options may include: Medicines to relieve pain and reduce how often you feel the need to urinate. This treatment may include: A procedure where a small amount of medicine that eases irritation is put inside your bladder through a catheter (bladder instillation). Lifestyle changes, such as changing your diet or taking steps to control stress. Physical therapy. This may include: Exercises to help relax the pelvic floor muscles. Massage to relax tight muscles (myofascial release). Learning ways to control when you urinate (bladder training). Using a device that provides electrical stimulation to your nerves, which can relieve pain (neuromodulation therapy). The device is placed on your back, where it blocks the nerves that cause you to feel pain in your bladder area. A procedure that stretches your bladder by filling it with air or fluid (hydrodistention). Surgery. This is rare. It is only done for extreme cases, if other treatments do not help. Follow these instructions at home: Lifestyle Learn and practice relaxation techniques, such as deep breathing and muscle relaxation. Get care for your body and mental well-being, such as: Cognitive behavioral therapy (CBT). This therapy changes the way you think or act in response to different situations. This may improve how you feel. Seeing a mental health therapist to evaluate and treat depression, if necessary. Work with your health care provider on other ways to manage pain. Acupuncture may be helpful. Avoid drinking alcohol. Do not use any products that contain nicotine or tobacco. These products include cigarettes, chewing tobacco, and vaping devices, such   as e-cigarettes. If you need help quitting, ask your health care provider. Eating and drinking Make dietary changes as recommended by your health care  provider. You may need to avoid: Spicy foods. Foods that contain a lot of potassium. Limit your intake of drinks that increase your urge to urinate. These include alcohol and caffeinated drinks like soda, coffee, and tea. Bladder training  Use bladder training techniques as directed. Techniques may include: Urinating at scheduled times. Training yourself to delay urination. Keep a bladder diary. Write down the times you urinate and any symptoms that you have. This can help you find out which foods, liquids, or activities make your symptoms worse. Use your bladder diary to schedule bathroom trips. If you are away from home, plan to be near a bathroom at each of your scheduled times. Make sure that you urinate just before you leave the house and just before you go to bed. General instructions Take over-the-counter and prescription medicines only as told by your health care provider. Try a warm or cool compress over your bladder for comfort. Avoid wearing tight clothing. Do exercises to relax your pelvic floor muscles as told by your physical therapist. Keep all follow-up visits. This is important. Where to find more information To find more information or a support group near you, visit: [employer] Foundation: urologyhealth.org Interstitial Cystitis Association: ichelp.org Contact a health care provider if you have: Symptoms that do not get better with treatment. Pain or discomfort that gets worse. More frequent urges to urinate. A fever. Get help right away if: You have no control over when you urinate. Summary Interstitial cystitis is inflammation of the bladder. This condition may cause pain in the bladder area as well as a frequent and urgent need to urinate. You may have flare-ups of the condition, and then it may go away for a while. For many people, it becomes a long-term (chronic) problem. There is no cure for interstitial cystitis, but treatment methods are available to  control your symptoms. This information is not intended to replace advice given to you by your health care provider. Make sure you discuss any questions you have with your health care provider. Document Revised: 04/11/2020 Document Reviewed: 04/11/2020 Elsevier Patient Education  2023 Elsevier Inc.  

## 2022-04-24 LAB — URINE CULTURE: Organism ID, Bacteria: NO GROWTH

## 2022-04-25 ENCOUNTER — Encounter: Payer: Self-pay | Admitting: Dermatology

## 2022-04-25 NOTE — Progress Notes (Signed)
   Follow-Up Visit   Subjective  Ruth Hopkins is a 56 y.o. female who presents for the following: Follow-up (Right cheek ln2 lesion never went a way & left cheek possible blood vessel.).  New crust on face plus red spot Location:  Duration:  Quality:  Associated Signs/Symptoms: Modifying Factors:  Severity:  Timing: Context:   Objective  Well appearing patient in no apparent distress; mood and affect are within normal limits. Left Malar Cheek Gritty 3 mm pink crust  Right Malar Cheek Nonpalpable spider ectasia, dermoscopy supports       A focused examination was performed including head and neck. Relevant physical exam findings are noted in the Assessment and Plan.   Assessment & Plan    AK (actinic keratosis) Left Malar Cheek  Destruction of lesion - Left Malar Cheek Complexity: simple   Destruction method: cryotherapy   Informed consent: discussed and consent obtained   Timeout:  patient name, date of birth, surgical site, and procedure verified Lesion destroyed using liquid nitrogen: Yes   Cryotherapy cycles:  5 Outcome: patient tolerated procedure well with no complications    Telangiectasia Right Malar Cheek  Discussed optional treatment with vascular laser and need to be rechecked if the spot grows or bleeds.       I, Lavonna Monarch, MD, have reviewed all documentation for this visit.  The documentation on 04/25/22 for the exam, diagnosis, procedures, and orders are all accurate and complete.

## 2022-04-26 ENCOUNTER — Telehealth: Payer: Self-pay | Admitting: Family

## 2022-04-26 NOTE — Telephone Encounter (Signed)
Called patient and call was dropped while patient was talking about indigestion and gallbladder problems

## 2022-04-26 NOTE — Telephone Encounter (Signed)
Appt made

## 2022-04-26 NOTE — Telephone Encounter (Signed)
Please call patient to discuss GI issues.

## 2022-04-26 NOTE — Telephone Encounter (Signed)
Called back left message 

## 2022-04-27 ENCOUNTER — Encounter: Payer: Self-pay | Admitting: Nurse Practitioner

## 2022-04-27 ENCOUNTER — Ambulatory Visit (INDEPENDENT_AMBULATORY_CARE_PROVIDER_SITE_OTHER): Payer: Medicare HMO | Admitting: Nurse Practitioner

## 2022-04-27 ENCOUNTER — Ambulatory Visit (INDEPENDENT_AMBULATORY_CARE_PROVIDER_SITE_OTHER): Payer: Medicare HMO

## 2022-04-27 ENCOUNTER — Other Ambulatory Visit: Payer: Self-pay

## 2022-04-27 VITALS — BP 86/66 | HR 87 | Temp 98.8°F | Ht 64.0 in | Wt 125.0 lb

## 2022-04-27 DIAGNOSIS — R1013 Epigastric pain: Secondary | ICD-10-CM

## 2022-04-27 DIAGNOSIS — R5383 Other fatigue: Secondary | ICD-10-CM

## 2022-04-27 DIAGNOSIS — R531 Weakness: Secondary | ICD-10-CM

## 2022-04-27 DIAGNOSIS — R0981 Nasal congestion: Secondary | ICD-10-CM

## 2022-04-27 DIAGNOSIS — R1011 Right upper quadrant pain: Secondary | ICD-10-CM | POA: Diagnosis not present

## 2022-04-27 NOTE — Progress Notes (Signed)
Acute Office Visit  Subjective:     Patient ID: Ruth Hopkins, female    DOB: Nov 27, 1965, 56 y.o.   MRN: [id number]  Chief Complaint  Patient presents with   Abdominal Pain    Pt states this has been going on for a few days     Abdominal Pain This is a new problem. The current episode started yesterday. The onset quality is sudden. The problem occurs intermittently. The problem has been unchanged. The pain is located in the epigastric region and generalized abdominal region. The pain is at a severity of 5/10. The quality of the pain is aching, dull and a sensation of fullness. The abdominal pain radiates to the back and chest. Pertinent negatives include no diarrhea, fever, headaches, myalgias, nausea or vomiting. Nothing aggravates the pain. She has tried nothing for the symptoms.     Review of Systems  Constitutional: Negative.  Negative for fever.  HENT: Negative.    Eyes: Negative.   Respiratory: Negative.  Negative for cough.   Cardiovascular: Negative.   Gastrointestinal:  Positive for abdominal pain. Negative for diarrhea, nausea and vomiting.  Musculoskeletal:  Negative for myalgias.  Skin: Negative.   Neurological:  Negative for headaches.  All other systems reviewed and are negative.       Objective:    BP (!) 86/66   Pulse 87   Temp 98.8 F (37.1 C)   Ht '5\' 4"'$  (1.626 m)   Wt 125 lb (56.7 kg)   SpO2 99%   BMI 21.46 kg/m  BP Readings from Last 3 Encounters:  04/27/22 (!) 86/66  04/22/22 108/72  04/09/22 96/64   Wt Readings from Last 3 Encounters:  04/27/22 125 lb (56.7 kg)  04/22/22 125 lb 9.6 oz (57 kg)  04/09/22 125 lb 6.4 oz (56.9 kg)      Physical Exam Vitals and nursing note reviewed.  Constitutional:      Appearance: Normal appearance.  HENT:     Right Ear: External ear normal.     Left Ear: External ear normal.     Nose: Nose normal.  Eyes:     Conjunctiva/sclera: Conjunctivae normal.  Cardiovascular:     Rate and Rhythm: Normal rate  and regular rhythm.     Pulses: Normal pulses.     Heart sounds: Normal heart sounds.  Pulmonary:     Effort: Pulmonary effort is normal.     Breath sounds: Normal breath sounds.  Abdominal:     General: Bowel sounds are normal.  Musculoskeletal:     Lumbar back: Tenderness present.  Skin:    Findings: No rash.  Neurological:     General: No focal deficit present.     Mental Status: She is alert and oriented to person, place, and time.     No results found for any visits on 04/27/22.      Assessment & Plan:  Abdominal pain not well controlled in the past couple of days no nausea or vomiting associated with current symptoms.  Patient is not drinking water as she reports that she is afraid in the last 2 days to drink so she would not vomit.  Hence low blood pressure.  I encouraged hydration, Tylenol as needed for pain.  Completed abdominal x-ray to rule out gallbladder.  Follow-up with worsening unresolved symptoms. Problem List Items Addressed This Visit   None Visit Diagnoses     Epigastric pain    -  Primary   Relevant Orders   DG  Abd 2 Views       No orders of the defined types were placed in this encounter.   Return if symptoms worsen or fail to improve.  Ivy Lynn, NP

## 2022-04-27 NOTE — Patient Instructions (Signed)

## 2022-04-27 NOTE — Progress Notes (Signed)
Patient was in x-ray for an exam and asked if we could do a COVID testing for her today - she is having fatigue, not feeling well in general, and nasal congestion. Patient is concerned because she is around her elderly parents often. Spoke to [employer]/gallery conservator given for testing.

## 2022-04-28 ENCOUNTER — Other Ambulatory Visit: Payer: Self-pay | Admitting: Nurse Practitioner

## 2022-04-28 ENCOUNTER — Telehealth: Payer: Self-pay | Admitting: Family

## 2022-04-28 DIAGNOSIS — R319 Hematuria, unspecified: Secondary | ICD-10-CM | POA: Diagnosis not present

## 2022-04-28 DIAGNOSIS — E860 Dehydration: Secondary | ICD-10-CM | POA: Diagnosis not present

## 2022-04-28 DIAGNOSIS — K529 Noninfective gastroenteritis and colitis, unspecified: Secondary | ICD-10-CM | POA: Diagnosis not present

## 2022-04-28 DIAGNOSIS — R350 Frequency of micturition: Secondary | ICD-10-CM | POA: Diagnosis not present

## 2022-04-28 DIAGNOSIS — R1013 Epigastric pain: Secondary | ICD-10-CM | POA: Diagnosis not present

## 2022-04-28 DIAGNOSIS — I951 Orthostatic hypotension: Secondary | ICD-10-CM | POA: Diagnosis not present

## 2022-04-28 DIAGNOSIS — R5383 Other fatigue: Secondary | ICD-10-CM | POA: Diagnosis not present

## 2022-04-28 DIAGNOSIS — K5900 Constipation, unspecified: Secondary | ICD-10-CM | POA: Diagnosis not present

## 2022-04-28 DIAGNOSIS — K2900 Acute gastritis without bleeding: Secondary | ICD-10-CM | POA: Diagnosis not present

## 2022-04-28 DIAGNOSIS — U071 COVID-19: Secondary | ICD-10-CM

## 2022-04-28 DIAGNOSIS — K219 Gastro-esophageal reflux disease without esophagitis: Secondary | ICD-10-CM

## 2022-04-28 LAB — NOVEL CORONAVIRUS, NAA: SARS-CoV-2, NAA: DETECTED — AB

## 2022-04-28 MED ORDER — NIRMATRELVIR/RITONAVIR (PAXLOVID)TABLET: 3.0000 | ORAL_TABLET | Freq: Two times a day (BID) | ORAL | 0 refills | Status: DC

## 2022-04-28 MED ORDER — NIRMATRELVIR/RITONAVIR (PAXLOVID)TABLET: 3.0000 | ORAL_TABLET | Freq: Two times a day (BID) | ORAL | 0 refills | Status: AC

## 2022-04-28 MED ORDER — OMEPRAZOLE 20 MG PO CPDR: 20.0000 mg | DELAYED_RELEASE_CAPSULE | Freq: Every day | ORAL | 3 refills | Status: DC

## 2022-04-28 NOTE — Telephone Encounter (Signed)
Omeprazole is OTC but I sent in prescription to [facility] for 20 mg tablets daily we can a effectiveness in a few weeks

## 2022-04-28 NOTE — Telephone Encounter (Signed)
We discussed hydration and went over her med's, if blood pressure is not improving she may want to go to the ED, we are unable to give IV hydration in clinic

## 2022-04-28 NOTE — Telephone Encounter (Signed)
Paxlovid sent to [facility]

## 2022-04-29 NOTE — Telephone Encounter (Signed)
Patient aware and verbalized understanding. °

## 2022-04-29 NOTE — Telephone Encounter (Signed)
PAtient went to urgent care

## 2022-04-30 MED ORDER — ONDANSETRON HCL 4 MG PO TABS: 4.0000 mg | ORAL_TABLET | Freq: Three times a day (TID) | ORAL | 0 refills | Status: DC | PRN

## 2022-04-30 NOTE — Telephone Encounter (Signed)
Patient aware and verbalized understanding. °

## 2022-04-30 NOTE — Telephone Encounter (Signed)
Pt tested positive for covid per lab results. Pt wants to know if christy can send her in some zofran for her nausea.

## 2022-04-30 NOTE — Telephone Encounter (Signed)
Zofran Prescription sent to pharmacy  ° °

## 2022-05-03 ENCOUNTER — Telehealth: Payer: Self-pay | Admitting: Family

## 2022-05-03 ENCOUNTER — Other Ambulatory Visit: Payer: Self-pay | Admitting: Nurse Practitioner

## 2022-05-03 DIAGNOSIS — R1084 Generalized abdominal pain: Secondary | ICD-10-CM

## 2022-05-03 NOTE — Telephone Encounter (Signed)
Patient aware and verbalizes understanding. 

## 2022-05-03 NOTE — Telephone Encounter (Signed)
Side effects from COVID can take a while to finally clear up, I will recommend hydration, good quality vitamin  supplement with C, D , and exercise to build back strength.    Have patient keep up with her 3 or 6 months follow up if she needs thyroid checks or any other labs.

## 2022-05-03 NOTE — Telephone Encounter (Signed)
Had visit with JE. C/O weakness still.  Is there anything else she can do to help?

## 2022-05-03 NOTE — Telephone Encounter (Signed)
Patient still weak from [place], she tested positive about a week ago. She has taken all of her nirmatrelvir/ritonavir EUA (PAXLOVID) 20 x 150 MG & 10 x '100MG'$  TABS and would like to know if there is anything else that she should try to take to help. Stated that she is feeling better but still weak. Please call back and advise.

## 2022-05-17 DIAGNOSIS — F17200 Nicotine dependence, unspecified, uncomplicated: Secondary | ICD-10-CM | POA: Diagnosis not present

## 2022-05-17 DIAGNOSIS — K219 Gastro-esophageal reflux disease without esophagitis: Secondary | ICD-10-CM | POA: Diagnosis not present

## 2022-05-17 DIAGNOSIS — E039 Hypothyroidism, unspecified: Secondary | ICD-10-CM | POA: Diagnosis not present

## 2022-05-17 DIAGNOSIS — J309 Allergic rhinitis, unspecified: Secondary | ICD-10-CM | POA: Diagnosis not present

## 2022-05-17 DIAGNOSIS — F3341 Major depressive disorder, recurrent, in partial remission: Secondary | ICD-10-CM | POA: Diagnosis not present

## 2022-05-17 DIAGNOSIS — M1990 Unspecified osteoarthritis, unspecified site: Secondary | ICD-10-CM | POA: Diagnosis not present

## 2022-05-17 DIAGNOSIS — Z20822 Contact with and (suspected) exposure to covid-19: Secondary | ICD-10-CM | POA: Diagnosis not present

## 2022-05-17 DIAGNOSIS — M7918 Myalgia, other site: Secondary | ICD-10-CM | POA: Diagnosis not present

## 2022-05-17 DIAGNOSIS — G4700 Insomnia, unspecified: Secondary | ICD-10-CM | POA: Diagnosis not present

## 2022-05-17 DIAGNOSIS — Z79890 Hormone replacement therapy (postmenopausal): Secondary | ICD-10-CM | POA: Diagnosis not present

## 2022-05-17 DIAGNOSIS — F411 Generalized anxiety disorder: Secondary | ICD-10-CM | POA: Diagnosis not present

## 2022-05-17 DIAGNOSIS — R69 Illness, unspecified: Secondary | ICD-10-CM | POA: Diagnosis not present

## 2022-05-17 DIAGNOSIS — H04129 Dry eye syndrome of unspecified lacrimal gland: Secondary | ICD-10-CM | POA: Diagnosis not present

## 2022-05-27 DIAGNOSIS — R922 Inconclusive mammogram: Secondary | ICD-10-CM | POA: Diagnosis not present

## 2022-05-27 DIAGNOSIS — Z9189 Other specified personal risk factors, not elsewhere classified: Secondary | ICD-10-CM | POA: Diagnosis not present

## 2022-05-27 DIAGNOSIS — Z86000 Personal history of in-situ neoplasm of breast: Secondary | ICD-10-CM | POA: Diagnosis not present

## 2022-05-30 ENCOUNTER — Other Ambulatory Visit: Payer: Self-pay | Admitting: Family

## 2022-05-30 DIAGNOSIS — E039 Hypothyroidism, unspecified: Secondary | ICD-10-CM

## 2022-06-14 ENCOUNTER — Other Ambulatory Visit: Payer: Self-pay | Admitting: Family

## 2022-06-14 DIAGNOSIS — M5442 Lumbago with sciatica, left side: Secondary | ICD-10-CM

## 2022-06-18 ENCOUNTER — Other Ambulatory Visit: Payer: Self-pay | Admitting: Nurse Practitioner

## 2022-06-18 ENCOUNTER — Ambulatory Visit (INDEPENDENT_AMBULATORY_CARE_PROVIDER_SITE_OTHER): Payer: Medicare HMO | Admitting: Nurse Practitioner

## 2022-06-18 ENCOUNTER — Encounter: Payer: Self-pay | Admitting: Nurse Practitioner

## 2022-06-18 VITALS — BP 94/64 | HR 77 | Temp 98.6°F | Ht 64.0 in | Wt 126.8 lb

## 2022-06-18 DIAGNOSIS — R8290 Unspecified abnormal findings in urine: Secondary | ICD-10-CM

## 2022-06-18 DIAGNOSIS — N898 Other specified noninflammatory disorders of vagina: Secondary | ICD-10-CM | POA: Diagnosis not present

## 2022-06-18 DIAGNOSIS — R6889 Other general symptoms and signs: Secondary | ICD-10-CM | POA: Diagnosis not present

## 2022-06-18 LAB — URINALYSIS, ROUTINE W REFLEX MICROSCOPIC
Bilirubin, UA: NEGATIVE
Glucose, UA: NEGATIVE
Leukocytes,UA: NEGATIVE
Nitrite, UA: NEGATIVE
Protein,UA: NEGATIVE
Specific Gravity, UA: 1.025 (ref 1.005–1.030)
Urobilinogen, Ur: 0.2 mg/dL (ref 0.2–1.0)
pH, UA: 5.5 (ref 5.0–7.5)

## 2022-06-18 LAB — MICROSCOPIC EXAMINATION
Renal Epithel, UA: NONE SEEN /hpf
WBC, UA: NONE SEEN /hpf (ref 0–5)

## 2022-06-18 LAB — WET PREP FOR TRICH, YEAST, CLUE
Clue Cell Exam: NEGATIVE
Trichomonas Exam: NEGATIVE
Yeast Exam: NEGATIVE

## 2022-06-18 MED ORDER — NITROFURANTOIN MONOHYD MACRO 100 MG PO CAPS: 100.0000 mg | ORAL_CAPSULE | Freq: Two times a day (BID) | ORAL | 0 refills | Status: DC

## 2022-06-18 NOTE — Progress Notes (Signed)
Acute Office Visit  Subjective:     Patient ID: Ruth Hopkins, female    DOB: 1966/02/03, 56 y.o.   MRN: [id number]  Chief Complaint  Patient presents with   Vaginal Itching    Vaginal Itching The patient's primary symptoms include vaginal discharge. The patient's pertinent negatives include no genital lesions, genital odor, genital rash or pelvic pain. This is a new problem. The current episode started in the past 7 days. The problem occurs constantly. The problem has been unchanged. The patient is experiencing no pain. Associated symptoms include rash. Pertinent negatives include no abdominal pain, anorexia, back pain, chills or headaches. The vaginal discharge was normal. She has not been passing clots. Nothing aggravates the symptoms. She has tried nothing for the symptoms. It is unknown whether or not her partner has an STD.    Review of Systems  Constitutional: Negative.  Negative for chills.  HENT: Negative.    Eyes: Negative.   Respiratory: Negative.    Cardiovascular: Negative.   Gastrointestinal: Negative.  Negative for abdominal pain and anorexia.  Genitourinary:  Positive for vaginal discharge. Negative for pelvic pain.  Musculoskeletal:  Negative for back pain.  Skin:  Positive for rash. Negative for itching.  Neurological:  Negative for headaches.  All other systems reviewed and are negative.       Objective:    BP 94/64   Pulse 77   Temp 98.6 F (37 C)   Ht '5\' 4"'$  (1.626 m)   Wt 126 lb 12.8 oz (57.5 kg)   SpO2 99%   BMI 21.77 kg/m  BP Readings from Last 3 Encounters:  06/18/22 94/64  04/27/22 (!) 86/66  04/22/22 108/72   Wt Readings from Last 3 Encounters:  06/18/22 126 lb 12.8 oz (57.5 kg)  04/27/22 125 lb (56.7 kg)  04/22/22 125 lb 9.6 oz (57 kg)      Physical Exam Vitals and nursing note reviewed.  Constitutional:      Appearance: Normal appearance.  HENT:     Head: Normocephalic.     Right Ear: External ear normal.     Left Ear: External  ear normal.     Nose: Nose normal.     Mouth/Throat:     Mouth: Mucous membranes are moist.     Pharynx: Oropharynx is clear.  Eyes:     Conjunctiva/sclera: Conjunctivae normal.  Cardiovascular:     Rate and Rhythm: Normal rate and regular rhythm.     Pulses: Normal pulses.     Heart sounds: Normal heart sounds.  Pulmonary:     Effort: Pulmonary effort is normal.     Breath sounds: Normal breath sounds.  Abdominal:     General: Bowel sounds are normal.  Skin:    General: Skin is warm.     Findings: No erythema or rash.          Comments: Slight red rash on labia  Neurological:     General: No focal deficit present.     Mental Status: She is alert and oriented to person, place, and time.  Psychiatric:        Mood and Affect: Mood normal.        Behavior: Behavior normal.     No results found for any visits on 06/18/22.      Assessment & Plan:   Completed urinalysis, wet prep with results pending - Products indicated for sensitive skin, keep skin moisturize and avoid hot showers. Precaution and education provided -All questions  answered -follow up with unresolved symptoms  Problem List Items Addressed This Visit   None Visit Diagnoses     Vaginal discharge    -  Primary   Relevant Orders   Urinalysis, Routine w reflex microscopic   WET PREP FOR TRICH, YEAST, CLUE   Urine Culture       No orders of the defined types were placed in this encounter.   Return if symptoms worsen or fail to improve.  Ivy Lynn, NP

## 2022-06-18 NOTE — Patient Instructions (Signed)

## 2022-06-20 LAB — URINE CULTURE

## 2022-06-30 ENCOUNTER — Telehealth: Payer: Self-pay | Admitting: Family

## 2022-07-07 DIAGNOSIS — K581 Irritable bowel syndrome with constipation: Secondary | ICD-10-CM | POA: Diagnosis not present

## 2022-07-07 DIAGNOSIS — Z86010 Personal history of colonic polyps: Secondary | ICD-10-CM | POA: Diagnosis not present

## 2022-07-07 DIAGNOSIS — R1084 Generalized abdominal pain: Secondary | ICD-10-CM | POA: Diagnosis not present

## 2022-07-07 DIAGNOSIS — K921 Melena: Secondary | ICD-10-CM | POA: Diagnosis not present

## 2022-07-07 DIAGNOSIS — R140 Abdominal distension (gaseous): Secondary | ICD-10-CM | POA: Diagnosis not present

## 2022-07-07 DIAGNOSIS — Z8719 Personal history of other diseases of the digestive system: Secondary | ICD-10-CM | POA: Diagnosis not present

## 2022-07-12 ENCOUNTER — Other Ambulatory Visit: Payer: Self-pay | Admitting: Family

## 2022-07-12 DIAGNOSIS — E039 Hypothyroidism, unspecified: Secondary | ICD-10-CM

## 2022-07-13 DIAGNOSIS — R69 Illness, unspecified: Secondary | ICD-10-CM | POA: Diagnosis not present

## 2022-07-13 DIAGNOSIS — F411 Generalized anxiety disorder: Secondary | ICD-10-CM | POA: Diagnosis not present

## 2022-07-13 DIAGNOSIS — F331 Major depressive disorder, recurrent, moderate: Secondary | ICD-10-CM | POA: Diagnosis not present

## 2022-07-19 ENCOUNTER — Ambulatory Visit: Payer: Medicare HMO | Admitting: Podiatry

## 2022-07-22 ENCOUNTER — Ambulatory Visit: Payer: Medicare HMO | Admitting: Family

## 2022-07-27 ENCOUNTER — Encounter: Payer: Self-pay | Admitting: Family

## 2022-07-27 ENCOUNTER — Ambulatory Visit (INDEPENDENT_AMBULATORY_CARE_PROVIDER_SITE_OTHER): Payer: Medicare HMO | Admitting: Family

## 2022-07-27 VITALS — BP 118/78 | HR 71 | Temp 98.4°F | Ht 64.0 in | Wt 127.0 lb

## 2022-07-27 DIAGNOSIS — G43901 Migraine, unspecified, not intractable, with status migrainosus: Secondary | ICD-10-CM

## 2022-07-27 DIAGNOSIS — F339 Major depressive disorder, recurrent, unspecified: Secondary | ICD-10-CM | POA: Diagnosis not present

## 2022-07-27 DIAGNOSIS — F419 Anxiety disorder, unspecified: Secondary | ICD-10-CM

## 2022-07-27 DIAGNOSIS — Z0000 Encounter for general adult medical examination without abnormal findings: Secondary | ICD-10-CM | POA: Diagnosis not present

## 2022-07-27 DIAGNOSIS — K219 Gastro-esophageal reflux disease without esophagitis: Secondary | ICD-10-CM

## 2022-07-27 DIAGNOSIS — E039 Hypothyroidism, unspecified: Secondary | ICD-10-CM | POA: Diagnosis not present

## 2022-07-27 DIAGNOSIS — Z0001 Encounter for general adult medical examination with abnormal findings: Secondary | ICD-10-CM

## 2022-07-27 DIAGNOSIS — R69 Illness, unspecified: Secondary | ICD-10-CM | POA: Diagnosis not present

## 2022-07-27 DIAGNOSIS — R5383 Other fatigue: Secondary | ICD-10-CM | POA: Diagnosis not present

## 2022-07-27 DIAGNOSIS — M797 Fibromyalgia: Secondary | ICD-10-CM | POA: Diagnosis not present

## 2022-07-27 DIAGNOSIS — E785 Hyperlipidemia, unspecified: Secondary | ICD-10-CM | POA: Diagnosis not present

## 2022-07-27 DIAGNOSIS — F17200 Nicotine dependence, unspecified, uncomplicated: Secondary | ICD-10-CM

## 2022-07-27 NOTE — Patient Instructions (Signed)

## 2022-07-27 NOTE — Progress Notes (Signed)
 Subjective:    Patient ID: Ruth Hopkins, female    DOB: 06/17/1966, 56 y.o.   MRN: [id number]  Chief Complaint  Patient presents with   Medical Management of Chronic Issues   PT presents to the office today for CPE and  chronic follow up. She is followed by psychiatrist 3 months for hot flashes and GAD.She is currently taking estradiol patch and gradually decreasing and trying to add other medications. She has tried gabapentin, Lexapro, and Effexor, and wellbutrin without success.   She is complaining of hair loss.    She has fibromyalgia and states she gets neck pain that causes her migraines. She is followed by GYN annually.  Migraine  This is a chronic problem. The current episode started more than 1 year ago. Episode frequency: 10 a month. The pain quality is similar to prior headaches. Associated symptoms include back pain, phonophobia and photophobia. The symptoms are aggravated by emotional stress. The treatment provided moderate relief. Her past medical history is significant for migraine headaches.  Gastroesophageal Reflux She complains of belching and heartburn. This is a chronic problem. The current episode started more than 1 year ago. The problem occurs frequently. The symptoms are aggravated by certain foods. Associated symptoms include fatigue. Risk factors include smoking/tobacco exposure. She has tried a PPI for the symptoms. The treatment provided moderate relief.  Thyroid Problem Presents for follow-up visit. Symptoms include anxiety, constipation, depressed mood, fatigue and hair loss. Patient reports no diarrhea. The symptoms have been stable.  Nicotine Dependence Presents for follow-up visit. Symptoms include fatigue and irritability. Her urge triggers include company of smokers. The symptoms have been stable. She smokes 1 pack of cigarettes per day.  Back Pain This is a chronic problem. The current episode started more than 1 year ago. The problem occurs intermittently.  The problem has been waxing and waning since onset. The pain is present in the lumbar spine. The quality of the pain is described as aching. The pain is at a severity of 8/10. The pain is moderate. She has tried home exercises and NSAIDs for the symptoms. The treatment provided mild relief.  Depression        This is a chronic problem.  The current episode started more than 1 year ago.   The problem occurs intermittently.  Associated symptoms include fatigue, helplessness, hopelessness, irritable, restlessness and sad.  Past medical history includes thyroid problem and anxiety.   Anxiety Presents for follow-up visit. Symptoms include depressed mood, excessive worry, irritability, nervous/anxious behavior and restlessness.        Review of Systems  Constitutional:  Positive for fatigue and irritability.  Eyes:  Positive for photophobia.  Gastrointestinal:  Positive for constipation and heartburn. Negative for diarrhea.  Musculoskeletal:  Positive for back pain.  Psychiatric/Behavioral:  Positive for depression. The patient is nervous/anxious.   All other systems reviewed and are negative.  Family History  Problem Relation Age of Onset   Asthma Mother    Hypothyroidism Mother    Hypertension Mother    Raynaud syndrome Mother    Hypertension Father    Social History   Socioeconomic History   Marital status: Married    Spouse name: Not on file   Number of children: 0   Years of education: Not on file   Highest education level: Not on file  Occupational History   Occupation: disability  Tobacco Use   Smoking status: Every Day    Packs/day: 1.00    Years: 35.00      Total pack years: 35.00    Types: Cigarettes   Smokeless tobacco: Never   Tobacco comments:    Smoking since age 20 - 1ppd usually - has tried to quit several times - is afraid it will make her gain weight  Vaping Use   Vaping Use: Never used  Substance and Sexual Activity   Alcohol use: No   Drug use: No    Sexual activity: Not on file  Other Topics Concern   Not on file  Social History Narrative   Lives home with husband in one level home   Her mother and sister live nearby   Strong Christian faith   Social Determinants of Health   Financial Resource Strain: Low Risk  (02/19/2022)   Overall Financial Resource Strain (CARDIA)    Difficulty of Paying Living Expenses: Not very hard  Food Insecurity: No Food Insecurity (02/19/2022)   Hunger Vital Sign    Worried About Running Out of Food in the Last Year: Never true    Ran Out of Food in the Last Year: Never true  Transportation Needs: No Transportation Needs (02/19/2022)   PRAPARE - Transportation    Lack of Transportation (Medical): No    Lack of Transportation (Non-Medical): No  Physical Activity: Sufficiently Active (02/19/2022)   Exercise Vital Sign    Days of Exercise per Week: 5 days    Minutes of Exercise per Session: 30 min  Stress: Stress Concern Present (02/19/2022)   [employer] - Occupational Stress Questionnaire    Feeling of Stress : To some extent  Social Connections: Moderately Isolated (02/19/2022)   Social Connection and Isolation Panel [NHANES]    Frequency of Communication with Friends and Family: More than three times a week    Frequency of Social Gatherings with Friends and Family: More than three times a week    Attends Religious Services: Never    Active Member of Clubs or Organizations: No    Attends Club or Organization Meetings: Never    Marital Status: Married       Objective:   Physical Exam Vitals reviewed.  Constitutional:      General: She is irritable. She is not in acute distress.    Appearance: She is well-developed.  HENT:     Head: Normocephalic and atraumatic.     Right Ear: Tympanic membrane normal.     Left Ear: Tympanic membrane normal.  Eyes:     Pupils: Pupils are equal, round, and reactive to light.  Neck:     Thyroid: No thyromegaly.  Cardiovascular:      Rate and Rhythm: Normal rate and regular rhythm.     Heart sounds: Normal heart sounds. No murmur heard. Pulmonary:     Effort: Pulmonary effort is normal. No respiratory distress.     Breath sounds: Normal breath sounds. No wheezing.  Abdominal:     General: Bowel sounds are normal. There is no distension.     Palpations: Abdomen is soft.     Tenderness: There is no abdominal tenderness.  Musculoskeletal:        General: No tenderness. Normal range of motion.     Cervical back: Normal range of motion and neck supple.  Skin:    General: Skin is warm and dry.  Neurological:     Mental Status: She is alert and oriented to person, place, and time.     Cranial Nerves: No cranial nerve deficit.     Deep Tendon Reflexes: Reflexes are   normal and symmetric.  Psychiatric:        Behavior: Behavior normal.        Thought Content: Thought content normal.        Judgment: Judgment normal.       BP 118/78   Pulse 71   Temp 98.4 F (36.9 C) (Temporal)   Ht 5' 4" (1.626 m)   Wt 127 lb (57.6 kg)   BMI 21.80 kg/m      Assessment & Plan:  Ruth Hopkins comes in today with chief complaint of Medical Management of Chronic Issues   Diagnosis and orders addressed:  1. Fibromyalgia  - CMP14+EGFR - CBC with Differential/Platelet  2. Migraine with status migrainosus, not intractable, unspecified migraine type - CMP14+EGFR - CBC with Differential/Platelet  3. Acquired hypothyroidism - CMP14+EGFR - CBC with Differential/Platelet - TSH  4. Other fatigue - CMP14+EGFR - CBC with Differential/Platelet  5. Smoker -Smoking cessation discussed, [place] quit line number given - CMP14+EGFR - CBC with Differential/Platelet - CT CHEST LUNG CA SCREEN LOW DOSE W/O CM; Future  6. Depression, recurrent ([place])  - CMP14+EGFR - CBC with Differential/Platelet  7. Hyperlipidemia, unspecified hyperlipidemia type - CMP14+EGFR - CBC with Differential/Platelet - Lipid panel  8. Gastroesophageal  reflux disease, unspecified whether esophagitis present - CMP14+EGFR - CBC with Differential/Platelet  9. Anxiety - CMP14+EGFR - CBC with Differential/Platelet  10. Annual physical exam - CMP14+EGFR - CBC with Differential/Platelet - Lipid panel - TSH   Labs pending Health Maintenance reviewed Diet and exercise encouraged  Follow up plan: 6 months    Evelina Dun, FNP

## 2022-07-28 DIAGNOSIS — Z6821 Body mass index (BMI) 21.0-21.9, adult: Secondary | ICD-10-CM | POA: Diagnosis not present

## 2022-07-28 DIAGNOSIS — Z79890 Hormone replacement therapy (postmenopausal): Secondary | ICD-10-CM | POA: Diagnosis not present

## 2022-07-28 DIAGNOSIS — N760 Acute vaginitis: Secondary | ICD-10-CM | POA: Diagnosis not present

## 2022-07-28 DIAGNOSIS — Z01419 Encounter for gynecological examination (general) (routine) without abnormal findings: Secondary | ICD-10-CM | POA: Diagnosis not present

## 2022-07-28 LAB — CMP14+EGFR
ALT: 16 IU/L (ref 0–32)
AST: 18 IU/L (ref 0–40)
Albumin/Globulin Ratio: 2 (ref 1.2–2.2)
Albumin: 4.3 g/dL (ref 3.8–4.9)
Alkaline Phosphatase: 56 IU/L (ref 44–121)
BUN/Creatinine Ratio: 37 — ABNORMAL HIGH (ref 9–23)
BUN: 27 mg/dL — ABNORMAL HIGH (ref 6–24)
Bilirubin Total: 0.3 mg/dL (ref 0.0–1.2)
CO2: 26 mmol/L (ref 20–29)
Calcium: 9 mg/dL (ref 8.7–10.2)
Chloride: 101 mmol/L (ref 96–106)
Creatinine, Ser: 0.73 mg/dL (ref 0.57–1.00)
Globulin, Total: 2.2 g/dL (ref 1.5–4.5)
Glucose: 78 mg/dL (ref 70–99)
Potassium: 4.3 mmol/L (ref 3.5–5.2)
Sodium: 138 mmol/L (ref 134–144)
Total Protein: 6.5 g/dL (ref 6.0–8.5)
eGFR: 96 mL/min/{1.73_m2} (ref >59)

## 2022-07-28 LAB — CBC WITH DIFFERENTIAL/PLATELET
Basophils Absolute: 0.1 10*3/uL (ref 0.0–0.2)
Basos: 1 %
EOS (ABSOLUTE): 0.1 10*3/uL (ref 0.0–0.4)
Eos: 3 %
Hematocrit: 40.3 % (ref 34.0–46.6)
Hemoglobin: 13.3 g/dL (ref 11.1–15.9)
Immature Grans (Abs): 0 10*3/uL (ref 0.0–0.1)
Immature Granulocytes: 0 %
Lymphocytes Absolute: 1.7 10*3/uL (ref 0.7–3.1)
Lymphs: 38 %
MCH: 30 pg (ref 26.6–33.0)
MCHC: 33 g/dL (ref 31.5–35.7)
MCV: 91 fL (ref 79–97)
Monocytes Absolute: 0.3 10*3/uL (ref 0.1–0.9)
Monocytes: 7 %
Neutrophils Absolute: 2.3 10*3/uL (ref 1.4–7.0)
Neutrophils: 51 %
Platelets: 185 10*3/uL (ref 150–450)
RBC: 4.44 x10E6/uL (ref 3.77–5.28)
RDW: 11.8 % (ref 11.7–15.4)
WBC: 4.5 10*3/uL (ref 3.4–10.8)

## 2022-07-28 LAB — LIPID PANEL
Chol/HDL Ratio: 3.7 ratio (ref 0.0–4.4)
Cholesterol, Total: 217 mg/dL — ABNORMAL HIGH (ref 100–199)
HDL: 59 mg/dL (ref >39)
LDL Chol Calc (NIH): 141 mg/dL — ABNORMAL HIGH (ref 0–99)
Triglycerides: 94 mg/dL (ref 0–149)
VLDL Cholesterol Cal: 17 mg/dL (ref 5–40)

## 2022-07-28 LAB — TSH: TSH: 1.37 u[IU]/mL (ref 0.450–4.500)

## 2022-07-29 NOTE — Progress Notes (Signed)
Patient returning call. Please call back

## 2022-08-09 DIAGNOSIS — L658 Other specified nonscarring hair loss: Secondary | ICD-10-CM | POA: Diagnosis not present

## 2022-08-25 DIAGNOSIS — Z9189 Other specified personal risk factors, not elsewhere classified: Secondary | ICD-10-CM | POA: Diagnosis not present

## 2022-08-25 DIAGNOSIS — Z86000 Personal history of in-situ neoplasm of breast: Secondary | ICD-10-CM | POA: Diagnosis not present

## 2022-08-25 DIAGNOSIS — R92333 Mammographic heterogeneous density, bilateral breasts: Secondary | ICD-10-CM | POA: Diagnosis not present

## 2022-08-27 ENCOUNTER — Telehealth: Payer: Self-pay | Admitting: Family

## 2022-08-27 NOTE — Telephone Encounter (Signed)
Pt wants to speak with nurse. Has questions about flu shot and rsv.

## 2022-08-27 NOTE — Telephone Encounter (Signed)
Called and spoke with patient she verbalized understanding

## 2022-09-22 ENCOUNTER — Other Ambulatory Visit: Payer: Self-pay | Admitting: Family

## 2022-09-22 DIAGNOSIS — M5442 Lumbago with sciatica, left side: Secondary | ICD-10-CM

## 2022-10-04 ENCOUNTER — Telehealth: Payer: Medicare HMO

## 2022-10-04 ENCOUNTER — Ambulatory Visit: Payer: Medicare HMO | Admitting: Nurse Practitioner

## 2022-10-04 DIAGNOSIS — R109 Unspecified abdominal pain: Secondary | ICD-10-CM | POA: Diagnosis not present

## 2022-10-04 DIAGNOSIS — R059 Cough, unspecified: Secondary | ICD-10-CM | POA: Diagnosis not present

## 2022-10-06 ENCOUNTER — Encounter: Payer: Self-pay | Admitting: Family Medicine

## 2022-10-06 ENCOUNTER — Telehealth (INDEPENDENT_AMBULATORY_CARE_PROVIDER_SITE_OTHER): Payer: Medicare HMO | Admitting: Family Medicine

## 2022-10-06 DIAGNOSIS — J069 Acute upper respiratory infection, unspecified: Secondary | ICD-10-CM

## 2022-10-06 DIAGNOSIS — F17210 Nicotine dependence, cigarettes, uncomplicated: Secondary | ICD-10-CM

## 2022-10-06 DIAGNOSIS — R69 Illness, unspecified: Secondary | ICD-10-CM | POA: Diagnosis not present

## 2022-10-06 DIAGNOSIS — K2900 Acute gastritis without bleeding: Secondary | ICD-10-CM

## 2022-10-06 NOTE — Progress Notes (Signed)
Subjective:    Patient ID: Ruth Hopkins, female    DOB: June 08, 1966, 57 y.o.   MRN: [id number]   HPI: Ruth Hopkins is a 57 y.o. female presenting for stomach pain, constipation and low blood pressure, 100/62. First occurred with Covid last August. Now having 99.3 fever today. with burning in the nose. Fatigue.       04/27/2022    2:36 PM 04/09/2022    2:23 PM 02/19/2022   11:21 AM 01/18/2022   11:38 AM 09/08/2021    2:24 PM  Depression screen PHQ 2/9  Decreased Interest 0 1 1 0 0  Down, Depressed, Hopeless '1 1 2 1 '$ 0  PHQ - 2 Score '1 2 3 1 '$ 0  Altered sleeping '1 1 1 1 1  '$ Tired, decreased energy 1 0 1 0 1  Change in appetite 0 0 0 0 0  Feeling bad or failure about yourself  0 1 1 0 0  Trouble concentrating 0 0 0 0 0  Moving slowly or fidgety/restless 0 0 0 0 0  Suicidal thoughts 0 0 0 0 0  PHQ-9 Score '3 4 6 2 2  '$ Difficult doing work/chores Somewhat difficult Very difficult Somewhat difficult  Somewhat difficult     Relevant past medical, surgical, family and social history reviewed and updated as indicated.  Interim medical history since our last visit reviewed. Allergies and medications reviewed and updated.  ROS:  Review of Systems  Constitutional:  Positive for appetite change (causes pain).  HENT: Negative.    Eyes:  Negative for visual disturbance.  Respiratory:  Negative for shortness of breath.   Cardiovascular:  Negative for chest pain.  Gastrointestinal:  Positive for abdominal distention (gas and bloating), abdominal pain (epigastric and hypogastric), constipation and nausea. Negative for diarrhea and vomiting.  Musculoskeletal:  Negative for arthralgias.     Social History   Tobacco Use  Smoking Status Every Day   Packs/day: 1.00   Years: 35.00   Total pack years: 35.00   Types: Cigarettes  Smokeless Tobacco Never  Tobacco Comments   Smoking since age 64 - 1ppd usually - has tried to quit several times - is afraid it will make her gain weight        Objective:     Wt Readings from Last 3 Encounters:  07/27/22 127 lb (57.6 kg)  06/18/22 126 lb 12.8 oz (57.5 kg)  04/27/22 125 lb (56.7 kg)     Exam deferred. Pt. Harboring due to COVID 19. Phone visit performed.   Assessment & Plan:   1. Viral URI   2. Acute gastritis without hemorrhage, unspecified gastritis type     No orders of the defined types were placed in this encounter.   Orders Placed This Encounter  Procedures   COVID-19, Flu A+B and RSV    Order Specific Question:   Previously tested for COVID-19    Answer:   Yes    Order Specific Question:   Resident in a congregate (group) care setting    Answer:   No    Order Specific Question:   Is the patient student?    Answer:   No    Order Specific Question:   Employed in healthcare setting    Answer:   No    Order Specific Question:   Has patient completed COVID vaccination(s) (2 doses of Pfizer/Moderna 1 dose of [employer])    Answer:   Unknown    Order Specific Question:  Release to patient    Answer:   Immediate    Order Specific Question:   Pregnant    Answer:   No      Diagnoses and all orders for this visit:  Viral URI -     COVID-19, Flu A+B and RSV  Acute gastritis without hemorrhage, unspecified gastritis type    Virtual Visit via telephone Note  I discussed the limitations, risks, security and privacy concerns of performing an evaluation and management service by telephone and the availability of in person appointments. The patient was identified with two identifiers. Pt.expressed understanding and agreed to proceed. Pt. Is at home. Dr. Livia Snellen is in his office.  Follow Up Instructions:   I discussed the assessment and treatment plan with the patient. The patient was provided an opportunity to ask questions and all were answered. The patient agreed with the plan and demonstrated an understanding of the instructions.   The patient was advised to call back or seek an in-person evaluation  if the symptoms worsen or if the condition fails to improve as anticipated.   Total minutes including chart review and phone contact time: 16   Follow up plan: Return if symptoms worsen or fail to improve.  Claretta Fraise, MD [place]

## 2022-10-07 ENCOUNTER — Telehealth: Payer: Self-pay | Admitting: Family

## 2022-10-07 ENCOUNTER — Other Ambulatory Visit: Payer: Self-pay | Admitting: Family Medicine

## 2022-10-07 DIAGNOSIS — J069 Acute upper respiratory infection, unspecified: Secondary | ICD-10-CM | POA: Diagnosis not present

## 2022-10-07 MED ORDER — AMOXICILLIN 875 MG PO TABS: 875.0000 mg | ORAL_TABLET | Freq: Two times a day (BID) | ORAL | 0 refills | Status: AC

## 2022-10-07 MED ORDER — PREDNISONE 10 MG PO TABS: ORAL_TABLET | ORAL | 0 refills | Status: DC

## 2022-10-07 NOTE — Telephone Encounter (Signed)
  Incoming Patient Call  10/07/2022  What symptoms do you have? Gum pain. Sinuses burning nose running  How long have you been sick? Two days  Have you been seen for this problem? Had televisit with Dr. Livia Snellen yesterday and forgot to ask for medication for this wants to know if he can prescribed prednisone/low dose titrate pak?  If your provider decides to give you a prescription, which pharmacy would you like for it to be sent to? [facility]   Patient informed that this information will be sent to the clinical staff for review and that they should receive a follow up call.

## 2022-10-07 NOTE — Telephone Encounter (Signed)
Please let the patient know that I sent their prescription to their pharmacy. Thanks, WS 

## 2022-10-07 NOTE — Telephone Encounter (Signed)
Patient aware and verbalized understanding.

## 2022-10-08 ENCOUNTER — Ambulatory Visit (INDEPENDENT_AMBULATORY_CARE_PROVIDER_SITE_OTHER): Payer: Medicare HMO | Admitting: Family

## 2022-10-08 ENCOUNTER — Encounter: Payer: Self-pay | Admitting: Family

## 2022-10-08 ENCOUNTER — Telehealth: Payer: Self-pay | Admitting: Family

## 2022-10-08 VITALS — BP 93/57 | HR 87 | Temp 98.0°F | Ht 64.0 in | Wt 127.6 lb

## 2022-10-08 DIAGNOSIS — R109 Unspecified abdominal pain: Secondary | ICD-10-CM | POA: Diagnosis not present

## 2022-10-08 DIAGNOSIS — J069 Acute upper respiratory infection, unspecified: Secondary | ICD-10-CM

## 2022-10-08 LAB — COVID-19, FLU A+B AND RSV
Influenza A, NAA: NOT DETECTED
Influenza B, NAA: NOT DETECTED
RSV, NAA: NOT DETECTED
SARS-CoV-2, NAA: NOT DETECTED

## 2022-10-08 NOTE — Progress Notes (Signed)
Good News! Your COVID, RSV & flu test turned out negative!

## 2022-10-08 NOTE — Progress Notes (Signed)
Subjective:    Patient ID: Ruth Hopkins, female    DOB: [birth year], 57 y.o.   MRN: [id number]  Chief Complaint  Patient presents with   Abdominal Pain   Cough   Sinus Problem    Seen UC Monday and stacks 2 days ago waiting on test to come back    Pt presents to the office today 7 days ago with abdominal pain,  bloating, burning in gums, coughing, rhinorrhea. Ruth Hopkins was seen in the Urgent Care on 10/04/22 and was negative for Flu, COVID, and RSV.  Ruth Hopkins then had a virtual appointment yesterday and did a  COVID test tat is pending.  Abdominal Pain This is a new problem. The current episode started 1 to 4 weeks ago. The onset quality is gradual. The problem occurs intermittently. The problem has been gradually improving. The pain is located in the generalized abdominal region. The pain is at a severity of 4/10. The pain is mild. Associated symptoms include flatus and nausea. Pertinent negatives include no diarrhea, fever (99.5), hematuria or vomiting. The pain is aggravated by eating.  Cough Pertinent negatives include no fever (99.5).  Sinus Problem This is a new problem. The current episode started 1 to 4 weeks ago. The problem has been waxing and waning since onset. There has been no fever. Associated symptoms include coughing. Past treatments include antibiotics and acetaminophen. The treatment provided mild relief.      Review of Systems  Constitutional:  Negative for fever (99.5).  Respiratory:  Positive for cough.   Gastrointestinal:  Positive for abdominal pain, flatus and nausea. Negative for diarrhea and vomiting.  Genitourinary:  Negative for hematuria.  All other systems reviewed and are negative.      Objective:   Physical Exam Vitals reviewed.  Constitutional:      General: Ruth Hopkins is not in acute distress.    Appearance: Ruth Hopkins is well-developed.  HENT:     Head: Normocephalic and atraumatic.     Right Ear: External ear normal.  Eyes:     Pupils: Pupils are equal, round,  and reactive to light.  Neck:     Thyroid: No thyromegaly.  Cardiovascular:     Rate and Rhythm: Normal rate and regular rhythm.     Heart sounds: Normal heart sounds. No murmur heard. Pulmonary:     Effort: Pulmonary effort is normal. No respiratory distress.     Breath sounds: Normal breath sounds. No wheezing.  Abdominal:     General: Bowel sounds are normal. There is no distension.     Palpations: Abdomen is soft.     Tenderness: There is no abdominal tenderness.  Musculoskeletal:        General: No tenderness. Normal range of motion.     Cervical back: Normal range of motion and neck supple.  Skin:    General: Skin is warm and dry.  Neurological:     Mental Status: Ruth Hopkins is alert and oriented to person, place, and time.     Cranial Nerves: No cranial nerve deficit.     Deep Tendon Reflexes: Reflexes are normal and symmetric.  Psychiatric:        Behavior: Behavior normal.        Thought Content: Thought content normal.        Judgment: Judgment normal.     BP (!) 93/57   Pulse 87   Temp 98 F (36.7 C) (Temporal)   Ht '5\' 4"'$  (1.626 m)   Wt 127 lb 9.6 oz (  57.9 kg)   BMI 21.90 kg/m      Assessment & Plan:  [name] comes in today with chief complaint of Abdominal Pain, Cough, and Sinus Problem (Seen UC Monday and stacks 2 days ago waiting on test to come back )   Diagnosis and orders addressed:  1. Abdominal pain, unspecified abdominal location CBC pending  - CBC with Differential/Platelet  2. Viral URI - Take meds as prescribed - Use a cool mist humidifier  -Use saline nose sprays frequently -Force fluids -For any cough or congestion  Use plain Mucinex- regular strength or max strength is fine -For fever or aces or pains- take tylenol or ibuprofen. -Throat lozenges if help -Follow up if symptoms worsen or do not improve  - CBC with Differential/Platelet  Evelina Dun, FNP

## 2022-10-08 NOTE — Patient Instructions (Signed)

## 2022-10-08 NOTE — Telephone Encounter (Signed)
Spoke to per lab

## 2022-10-09 LAB — CBC WITH DIFFERENTIAL/PLATELET
Basophils Absolute: 0.1 10*3/uL (ref 0.0–0.2)
Basos: 1 %
EOS (ABSOLUTE): 0 10*3/uL (ref 0.0–0.4)
Eos: 0 %
Hematocrit: 36.1 % (ref 34.0–46.6)
Hemoglobin: 11.9 g/dL (ref 11.1–15.9)
Immature Grans (Abs): 0 10*3/uL (ref 0.0–0.1)
Immature Granulocytes: 0 %
Lymphocytes Absolute: 0.5 10*3/uL — ABNORMAL LOW (ref 0.7–3.1)
Lymphs: 7 %
MCH: 29 pg (ref 26.6–33.0)
MCHC: 33 g/dL (ref 31.5–35.7)
MCV: 88 fL (ref 79–97)
Monocytes Absolute: 0.2 10*3/uL (ref 0.1–0.9)
Monocytes: 3 %
Neutrophils Absolute: 6.6 10*3/uL (ref 1.4–7.0)
Neutrophils: 89 %
Platelets: 298 10*3/uL (ref 150–450)
RBC: 4.1 x10E6/uL (ref 3.77–5.28)
RDW: 11.7 % (ref 11.7–15.4)
WBC: 7.4 10*3/uL (ref 3.4–10.8)

## 2022-10-11 ENCOUNTER — Ambulatory Visit (INDEPENDENT_AMBULATORY_CARE_PROVIDER_SITE_OTHER): Payer: Medicare HMO | Admitting: Nurse Practitioner

## 2022-10-11 ENCOUNTER — Encounter: Payer: Self-pay | Admitting: Nurse Practitioner

## 2022-10-11 VITALS — BP 99/65 | HR 82 | Temp 97.8°F | Resp 20 | Ht 64.0 in | Wt 127.0 lb

## 2022-10-11 DIAGNOSIS — R1032 Left lower quadrant pain: Secondary | ICD-10-CM | POA: Diagnosis not present

## 2022-10-11 NOTE — Progress Notes (Signed)
   Subjective:    Patient ID: Ruth Hopkins, female    DOB: 06-20-1966, 57 y.o.   MRN: [id number]   Chief Complaint: recheck from last week  HPI Patient has been seen 3x in the last week. She went to urgent care. Saw DR. Stacks and Kayren Eaves. She was negative for covid 3x, flu was also negative. CBC normal. Had temp of 100.6 last night. She says that she is still having abdominal pain. Rates pain 6/10 most of the pain is on left side of abdomen. Has history of constipation.      Review of Systems  Constitutional:  Negative for diaphoresis.  Eyes:  Negative for pain.  Respiratory:  Negative for shortness of breath.   Cardiovascular:  Negative for chest pain, palpitations and leg swelling.  Gastrointestinal:  Negative for abdominal pain.  Endocrine: Negative for polydipsia.  Skin:  Negative for rash.  Neurological:  Negative for dizziness, weakness and headaches.  Hematological:  Does not bruise/bleed easily.  All other systems reviewed and are negative.      Objective:   Physical Exam Constitutional:      Appearance: She is well-developed.  Cardiovascular:     Rate and Rhythm: Normal rate and regular rhythm.     Heart sounds: Normal heart sounds.  Pulmonary:     Effort: Pulmonary effort is normal.     Breath sounds: Normal breath sounds.  Abdominal:     General: Abdomen is flat. Bowel sounds are normal.     Palpations: Abdomen is soft.     Tenderness: There is abdominal tenderness (diffuse throughout).  Neurological:     General: No focal deficit present.     Mental Status: She is alert and oriented to person, place, and time.  Psychiatric:        Mood and Affect: Mood normal.        Behavior: Behavior normal.    BP 99/65   Pulse 82   Temp 97.8 F (36.6 C) (Temporal)   Resp 20   Ht '5\' 4"'$  (1.626 m)   Wt 127 lb (57.6 kg)   SpO2 99%   BMI 21.80 kg/m         Assessment & Plan:   Ruth Hopkins in today with chief complaint of Abdominal Pain (Thinks she has  diverticulitis)   1. Left lower quadrant abdominal pain Will talk once test results are back - CT Abdomen Pelvis W Contrast; Future - CBC with Differential/Platelet    The above assessment and management plan was discussed with the patient. The patient verbalized understanding of and has agreed to the management plan. Patient is aware to call the clinic if symptoms persist or worsen. Patient is aware when to return to the clinic for a follow-up visit. Patient educated on when it is appropriate to go to the emergency department.   Mary-Margaret Hassell Done, FNP

## 2022-10-11 NOTE — Patient Instructions (Signed)
Diverticulitis  Diverticulitis is when small pouches in your colon (large intestine) get infected or swollen. This causes pain in the belly (abdomen) and watery poop (diarrhea). These pouches are called diverticula. The pouches form in people who have a condition called diverticulosis. What are the causes? This condition may be caused by poop (stool) that gets trapped in the pouches in your colon. The poop lets germs (bacteria) grow in the pouches. This causes the infection. What increases the risk? You are more likely to get this condition if you have small pouches in your colon. The risk is higher if: You are overweight or very overweight (obese). You do not exercise enough. You drink alcohol. You smoke or use products with tobacco in them. You eat a diet that has a lot of red meat such as beef, pork, or lamb. You eat a diet that does not have enough fiber in it. You are older than 58 years of age. What are the signs or symptoms? Pain in the belly. Pain is often on the left side, but it may be in other areas. Fever and feeling cold. Feeling like you may vomit. Vomiting. Having cramps. Feeling full. Changes to how often you poop. Blood in your poop. How is this treated? Most cases are treated at home by: Taking over-the-counter pain medicines. Following a clear liquid diet. Taking antibiotic medicines. Resting. Very bad cases may need to be treated at a hospital. This may include: Not eating or drinking. Taking prescription pain medicine. Getting antibiotic medicines through an IV tube. Getting fluid and food through an IV tube. Having surgery. When you are feeling better, your doctor may tell you to have a test to check your colon (colonoscopy). Follow these instructions at home: Medicines Take over-the-counter and prescription medicines only as told by your doctor. These include: Antibiotics. Pain medicines. Fiber pills. Probiotics. Stool softeners. If you were  prescribed an antibiotic medicine, take it as told by your doctor. Do not stop taking the antibiotic even if you start to feel better. Ask your doctor if the medicine prescribed to you requires you to avoid driving or using machinery. Eating and drinking  Follow a diet as told by your doctor. When you feel better, your doctor may tell you to change your diet. You may need to eat a lot of fiber. Fiber makes it easier to poop (have a bowel movement). Foods with fiber include: Berries. Beans. Lentils. Green vegetables. Avoid eating red meat. General instructions Do not use any products that contain nicotine or tobacco, such as cigarettes, e-cigarettes, and chewing tobacco. If you need help quitting, ask your doctor. Exercise 3 or more times a week. Try to get 30 minutes each time. Exercise enough to sweat and make your heart beat faster. Keep all follow-up visits as told by your doctor. This is important. Contact a doctor if: Your pain does not get better. You are not pooping like normal. Get help right away if: Your pain gets worse. Your symptoms do not get better. Your symptoms get worse very fast. You have a fever. You vomit more than one time. You have poop that is: Bloody. Black. Tarry. Summary This condition happens when small pouches in your colon get infected or swollen. Take medicines only as told by your doctor. Follow a diet as told by your doctor. Keep all follow-up visits as told by your doctor. This is important. This information is not intended to replace advice given to you by your health care provider. Make sure   you discuss any questions you have with your health care provider. Document Revised: 06/18/2019 Document Reviewed: 06/18/2019 Elsevier Patient Education  2023 [employer]  

## 2022-10-12 ENCOUNTER — Encounter (HOSPITAL_COMMUNITY): Payer: Self-pay | Admitting: Radiology

## 2022-10-12 ENCOUNTER — Ambulatory Visit (HOSPITAL_COMMUNITY)
Admission: RE | Admit: 2022-10-12 | Discharge: 2022-10-12 | Disposition: A | Discharge status: Home / Self Care | Payer: Medicare HMO | Source: Ambulatory Visit | Attending: Nurse Practitioner | Admitting: Nurse Practitioner

## 2022-10-12 DIAGNOSIS — R1032 Left lower quadrant pain: Secondary | ICD-10-CM | POA: Insufficient documentation

## 2022-10-12 DIAGNOSIS — R109 Unspecified abdominal pain: Secondary | ICD-10-CM | POA: Diagnosis not present

## 2022-10-12 LAB — CBC WITH DIFFERENTIAL/PLATELET
Basophils Absolute: 0 10*3/uL (ref 0.0–0.2)
Basos: 1 %
EOS (ABSOLUTE): 0.5 10*3/uL — ABNORMAL HIGH (ref 0.0–0.4)
Eos: 7 %
Hematocrit: 32.6 % — ABNORMAL LOW (ref 34.0–46.6)
Hemoglobin: 11.1 g/dL (ref 11.1–15.9)
Immature Grans (Abs): 0 10*3/uL (ref 0.0–0.1)
Immature Granulocytes: 0 %
Lymphocytes Absolute: 1.1 10*3/uL (ref 0.7–3.1)
Lymphs: 13 %
MCH: 29.4 pg (ref 26.6–33.0)
MCHC: 34 g/dL (ref 31.5–35.7)
MCV: 87 fL (ref 79–97)
Monocytes Absolute: 0.8 10*3/uL (ref 0.1–0.9)
Monocytes: 10 %
Neutrophils Absolute: 5.6 10*3/uL (ref 1.4–7.0)
Neutrophils: 69 %
Platelets: 326 10*3/uL (ref 150–450)
RBC: 3.77 x10E6/uL (ref 3.77–5.28)
RDW: 11.2 % — ABNORMAL LOW (ref 11.7–15.4)
WBC: 8.1 10*3/uL (ref 3.4–10.8)

## 2022-10-12 MED ORDER — IOHEXOL 9 MG/ML PO SOLN
ORAL | Status: AC
  Filled 2022-10-12: qty 1000

## 2022-10-12 MED ORDER — IOHEXOL 300 MG/ML  SOLN
100.0000 mL | Freq: Once | INTRAMUSCULAR | Status: AC | PRN
  Administered 2022-10-12: 75 mL via INTRAVENOUS

## 2022-10-13 ENCOUNTER — Telehealth: Payer: Self-pay | Admitting: *Deleted

## 2022-10-13 NOTE — Telephone Encounter (Signed)
Patient called back. Has more questions. Wants to speak to nurse. Doesn't think Dr Neldon Mc advise is going to help with fever and feeling like she is going to pass out.

## 2022-10-13 NOTE — Telephone Encounter (Signed)
Yes based on what you can see on the CT, I think an enema such as a fleets enema which can be bought over-the-counter would be a good idea for her to do, she can do 1 a day for the next few days to get things flushed and cleaned out and then if she still not feeling better let us know.  Make sure to drink plenty of fluids

## 2022-10-13 NOTE — Telephone Encounter (Signed)
Left message making pt ware of Dr. Merita Norton recommendations. Advised to call back in a few days if she is not feeling any better.

## 2022-10-13 NOTE — Telephone Encounter (Signed)
Pt called, she had an abd CT done yesterday. Her abd is distended, she is not really eaten has only had ice cream, is constipated, feels like she is going to pass out and her temp last night was 102. Should she take an enema or suppository, she thinks a laxative maybe too much Please advise

## 2022-10-14 MED ORDER — CIPROFLOXACIN HCL 500 MG PO TABS: 500.0000 mg | ORAL_TABLET | Freq: Two times a day (BID) | ORAL | 0 refills | Status: DC

## 2022-10-14 NOTE — Telephone Encounter (Signed)
Patient states the pain in stomach is so bad she took enema and got some out will and took gas x pill. Patient said scan showed in inflamed colon what can help her? Can meds be rx for her to help should she go to gastro doctor?

## 2022-10-14 NOTE — Addendum Note (Signed)
Addended by: Evelina Dun A on: 10/14/2022 03:19 PM   Modules accepted: Orders

## 2022-10-14 NOTE — Telephone Encounter (Signed)
Patient aware and verbalized understanding.

## 2022-10-14 NOTE — Telephone Encounter (Signed)
Cipro 500 mg BID for 5 days Prescription sent to pharmacy

## 2022-10-17 DIAGNOSIS — K567 Ileus, unspecified: Secondary | ICD-10-CM | POA: Diagnosis not present

## 2022-10-17 DIAGNOSIS — Z791 Long term (current) use of non-steroidal anti-inflammatories (NSAID): Secondary | ICD-10-CM | POA: Diagnosis not present

## 2022-10-17 DIAGNOSIS — K56600 Partial intestinal obstruction, unspecified as to cause: Secondary | ICD-10-CM | POA: Diagnosis not present

## 2022-10-17 DIAGNOSIS — Q433 Congenital malformations of intestinal fixation: Secondary | ICD-10-CM | POA: Diagnosis not present

## 2022-10-17 DIAGNOSIS — R69 Illness, unspecified: Secondary | ICD-10-CM | POA: Diagnosis not present

## 2022-10-17 DIAGNOSIS — K219 Gastro-esophageal reflux disease without esophagitis: Secondary | ICD-10-CM | POA: Diagnosis not present

## 2022-10-17 DIAGNOSIS — R112 Nausea with vomiting, unspecified: Secondary | ICD-10-CM | POA: Diagnosis not present

## 2022-10-17 DIAGNOSIS — Z79890 Hormone replacement therapy (postmenopausal): Secondary | ICD-10-CM | POA: Diagnosis not present

## 2022-10-17 DIAGNOSIS — R630 Anorexia: Secondary | ICD-10-CM | POA: Diagnosis not present

## 2022-10-17 DIAGNOSIS — M797 Fibromyalgia: Secondary | ICD-10-CM | POA: Diagnosis not present

## 2022-10-17 DIAGNOSIS — E039 Hypothyroidism, unspecified: Secondary | ICD-10-CM | POA: Diagnosis not present

## 2022-10-17 DIAGNOSIS — M170 Bilateral primary osteoarthritis of knee: Secondary | ICD-10-CM | POA: Diagnosis not present

## 2022-10-17 DIAGNOSIS — G9332 Myalgic encephalomyelitis/chronic fatigue syndrome: Secondary | ICD-10-CM | POA: Diagnosis not present

## 2022-10-17 DIAGNOSIS — F17210 Nicotine dependence, cigarettes, uncomplicated: Secondary | ICD-10-CM | POA: Diagnosis not present

## 2022-10-17 DIAGNOSIS — Z881 Allergy status to other antibiotic agents status: Secondary | ICD-10-CM | POA: Diagnosis not present

## 2022-10-17 DIAGNOSIS — R509 Fever, unspecified: Secondary | ICD-10-CM | POA: Diagnosis not present

## 2022-10-17 DIAGNOSIS — K8020 Calculus of gallbladder without cholecystitis without obstruction: Secondary | ICD-10-CM | POA: Diagnosis not present

## 2022-10-17 DIAGNOSIS — R0981 Nasal congestion: Secondary | ICD-10-CM | POA: Diagnosis not present

## 2022-10-17 DIAGNOSIS — E871 Hypo-osmolality and hyponatremia: Secondary | ICD-10-CM | POA: Diagnosis not present

## 2022-10-17 DIAGNOSIS — Z79899 Other long term (current) drug therapy: Secondary | ICD-10-CM | POA: Diagnosis not present

## 2022-10-17 DIAGNOSIS — Z91040 Latex allergy status: Secondary | ICD-10-CM | POA: Diagnosis not present

## 2022-10-17 DIAGNOSIS — E860 Dehydration: Secondary | ICD-10-CM | POA: Diagnosis not present

## 2022-10-17 DIAGNOSIS — D649 Anemia, unspecified: Secondary | ICD-10-CM | POA: Diagnosis not present

## 2022-10-17 DIAGNOSIS — Z882 Allergy status to sulfonamides status: Secondary | ICD-10-CM | POA: Diagnosis not present

## 2022-10-17 DIAGNOSIS — I959 Hypotension, unspecified: Secondary | ICD-10-CM | POA: Diagnosis not present

## 2022-10-17 DIAGNOSIS — Z1152 Encounter for screening for COVID-19: Secondary | ICD-10-CM | POA: Diagnosis not present

## 2022-10-17 DIAGNOSIS — K7689 Other specified diseases of liver: Secondary | ICD-10-CM | POA: Diagnosis not present

## 2022-10-17 DIAGNOSIS — Z886 Allergy status to analgesic agent status: Secondary | ICD-10-CM | POA: Diagnosis not present

## 2022-10-17 DIAGNOSIS — B974 Respiratory syncytial virus as the cause of diseases classified elsewhere: Secondary | ICD-10-CM | POA: Diagnosis not present

## 2022-10-17 DIAGNOSIS — K529 Noninfective gastroenteritis and colitis, unspecified: Secondary | ICD-10-CM | POA: Diagnosis not present

## 2022-10-21 DIAGNOSIS — K529 Noninfective gastroenteritis and colitis, unspecified: Secondary | ICD-10-CM | POA: Diagnosis not present

## 2022-10-22 ENCOUNTER — Telehealth: Payer: Self-pay

## 2022-10-22 NOTE — Patient Outreach (Signed)
  Care Coordination TOC Note Transition Care Management Follow-up Telephone Call Date of discharge and from where: 10/21/22-[facility]  Dx: "enteritis" How have you been since you were released from the hospital? Patient states she still feels about the same as she did while in the hospital. She has been running low grade temp 100.0 off and no-was having the same issue in hospital-taking Tylenol prn. RN CM discussed with patient other ways to manage/treat fever and when to seek medical attention. Patient voiced understanding. She denies any abd pain, n&v. States LBM was yesterday evening. She reports she ate a [employer] last night and nothing so far today. Encouraged increased fluid intake and importance of hydration. Paitient reports chronic back/neck pain. Any questions or concerns? No  Items Reviewed: Did the pt receive and understand the discharge instructions provided? Yes  Medications obtained and verified? No  Other? No  Any new allergies since your discharge? No  Dietary orders reviewed? Yes Do you have support at home? Yes -spouse  Home Care and Equipment/Supplies: Were home health services ordered? not applicable If so, what is the name of the agency? N/A  Has the agency set up a time to come to the patient's home? not applicable Were any new equipment or medical supplies ordered?  No What is the name of the medical supply agency? N/A Were you able to get the supplies/equipment? not applicable Do you have any questions related to the use of the equipment or supplies? No  Functional Questionnaire: (I = Independent and D = Dependent) ADLs: I  Bathing/Dressing- A  Meal Prep- A  Eating- I  Maintaining continence- I  Transferring/Ambulation- I  Managing Meds- I  Follow up appointments reviewed:  PCP Hospital f/u appt confirmed? Yes  Scheduled to see PCP on 10/25/22 @ 11:50 am. [facility] f/u appt confirmed?  Patient has been referred to GI-awaiting appt. She is  aware to make appt and follow up with neuro   Are transportation arrangements needed? No  If their condition worsens, is the pt aware to call PCP or go to the Emergency Dept.? Yes Was the patient provided with contact information for the PCP's office or ED? Yes Was to pt encouraged to call back with questions or concerns? Yes  SDOH assessments and interventions completed:   Yes SDOH Interventions Today    Flowsheet Row Most Recent Value  SDOH Interventions   Food Insecurity Interventions Intervention Not Indicated  Transportation Interventions Intervention Not Indicated       Care Coordination Interventions:  Education provided on sx mgmt    Encounter Outcome:  Pt. Visit Completed    Enzo Montgomery, RN,BSN,CCM [facility] Telephonic Care Management Coordinator Direct Phone: [phone] Toll Free: [phone] Fax: [phone]

## 2022-10-25 ENCOUNTER — Ambulatory Visit (INDEPENDENT_AMBULATORY_CARE_PROVIDER_SITE_OTHER): Payer: Medicare HMO

## 2022-10-25 ENCOUNTER — Ambulatory Visit (INDEPENDENT_AMBULATORY_CARE_PROVIDER_SITE_OTHER): Payer: Medicare HMO | Admitting: Family

## 2022-10-25 ENCOUNTER — Encounter: Payer: Self-pay | Admitting: Family

## 2022-10-25 VITALS — BP 93/64 | HR 96 | Temp 97.7°F | Ht 64.0 in | Wt 121.8 lb

## 2022-10-25 DIAGNOSIS — R109 Unspecified abdominal pain: Secondary | ICD-10-CM | POA: Diagnosis not present

## 2022-10-25 DIAGNOSIS — I959 Hypotension, unspecified: Secondary | ICD-10-CM

## 2022-10-25 DIAGNOSIS — K529 Noninfective gastroenteritis and colitis, unspecified: Secondary | ICD-10-CM

## 2022-10-25 DIAGNOSIS — K5900 Constipation, unspecified: Secondary | ICD-10-CM | POA: Diagnosis not present

## 2022-10-25 DIAGNOSIS — Z09 Encounter for follow-up examination after completed treatment for conditions other than malignant neoplasm: Secondary | ICD-10-CM

## 2022-10-25 DIAGNOSIS — R1084 Generalized abdominal pain: Secondary | ICD-10-CM

## 2022-10-25 DIAGNOSIS — K56600 Partial intestinal obstruction, unspecified as to cause: Secondary | ICD-10-CM

## 2022-10-25 NOTE — Progress Notes (Signed)
Subjective:    Patient ID: Ruth Hopkins, female    DOB: January 12, 1966, 57 y.o.   MRN: [id number]  Chief Complaint  Patient presents with   Hospitalization Follow-up    Still having a fever    PT presents to the office today for hospital follow up. She went to the ED on 10/17/22 with abdominal pain. She was diagnosed with enterocolitis, ileus, hypotension. She placed a full liquid diet and bowel regime with good improvement in her abdominal symptoms.   Abdominal Pain This is a new problem. The current episode started 1 to 4 weeks ago. The onset quality is sudden. The pain is located in the periumbilical region. The pain is at a severity of 5/10. The pain is mild. Quality: stabbing. Associated symptoms include diarrhea. Pertinent negatives include no constipation, nausea or vomiting. The pain is aggravated by movement. Treatments tried: bland diet. The treatment provided mild relief.      Review of Systems  Gastrointestinal:  Positive for abdominal pain and diarrhea. Negative for constipation, nausea and vomiting.  All other systems reviewed and are negative.      Objective:   Physical Exam Vitals reviewed.  Constitutional:      General: She is not in acute distress.    Appearance: She is well-developed.  HENT:     Head: Normocephalic and atraumatic.  Eyes:     Pupils: Pupils are equal, round, and reactive to light.  Neck:     Thyroid: No thyromegaly.  Cardiovascular:     Rate and Rhythm: Normal rate and regular rhythm.     Heart sounds: Normal heart sounds. No murmur heard. Pulmonary:     Effort: Pulmonary effort is normal. No respiratory distress.     Breath sounds: Normal breath sounds. No wheezing.  Abdominal:     General: Bowel sounds are normal. There is no distension.     Palpations: Abdomen is soft.     Tenderness: There is no abdominal tenderness (mild tenderness lower abdomen).  Musculoskeletal:        General: No tenderness. Normal range of motion.     Cervical  back: Normal range of motion and neck supple.  Skin:    General: Skin is warm and dry.  Neurological:     Mental Status: She is alert and oriented to person, place, and time.     Cranial Nerves: No cranial nerve deficit.     Deep Tendon Reflexes: Reflexes are normal and symmetric.  Psychiatric:        Behavior: Behavior normal.        Thought Content: Thought content normal.        Judgment: Judgment normal.       BP 93/64   Pulse 96   Temp 97.7 F (36.5 C) (Temporal)   Ht '5\' 4"'$  (1.626 m)   Wt 121 lb 12.8 oz (55.2 kg)   SpO2 98%   BMI 20.91 kg/m      Assessment & Plan:  Ruth Hopkins comes in today with chief complaint of Hospitalization Follow-up (Still having a fever )   Diagnosis and orders addressed:  1. Generalized abdominal pain - DG Abd 1 View - CMP14+EGFR - CBC with Differential/Platelet  2. Hospital discharge follow-up - DG Abd 1 View - CMP14+EGFR - CBC with Differential/Platelet  3. Enteritis - DG Abd 1 View - CMP14+EGFR - CBC with Differential/Platelet  4. Partial small bowel obstruction (HCC) - DG Abd 1 View - CMP14+EGFR - CBC with Differential/Platelet  5. Hypotension, unspecified hypotension type   Labs pending Continue Bland diet Continue to hold NSAID's Health Maintenance reviewed Diet and exercise encouraged  Follow up plan: Keep chronic follow up   Evelina Dun, FNP

## 2022-10-25 NOTE — Patient Instructions (Signed)
Bowel Obstruction A bowel obstruction is a blockage in the small or large bowel. The bowel is also called the intestine. It is a long tube that connects the stomach to the anus. When a person eats and drinks, food and fluids go from the mouth to the stomach to the small bowel. This is where most of the nutrients in the food and fluids are absorbed. After the small bowel, material passes through the large bowel for further absorption until any leftover material leaves the body as stool (feces) through the anus during a bowel movement. A bowel obstruction will prevent food and fluids from passing through the bowel as they normally do during digestion. The bowel can become partially or completely blocked. If this condition is not treated, it can be dangerous because the bowel could rupture. What are the causes? Common causes of this condition include: Scar tissue in the body (adhesions) from previous surgery or treatment with high-energy X-rays (radiation). Recent surgery. This may cause the movements of the bowel to slow down and cause food to block the intestine. Inflammatory bowel disease, such as Crohn's disease or diverticulitis. Growths or tumors. A bulging organ or tissue (hernia). Twisting of the bowel (volvulus). A swallowed object (foreign body). Slipping of a part of the bowel into another part (intussusception). What are the signs or symptoms? Symptoms of this condition include: Pain in the abdomen. Depending on the degree of obstruction, pain may be: Mild or severe. Dull cramping or sharp pain. In one area or in the entire abdomen. Nausea and vomiting. Vomit may be greenish or a yellow bile color. Bloating in the abdomen. Constipation. Being unable to pass gas. Frequent belching. Diarrhea. This may occur if the obstruction is partial and runny stool is able to leak around the obstruction. How is this diagnosed? This condition may be diagnosed based on: A physical exam. Your  medical history. Exams to look into the small intestine or the large intestine (endoscopy or colonoscopy). Imaging tests of the abdomen or pelvis, such as X-ray or CT scan. Blood or urine tests. How is this treated? Treatment for this condition depends on the cause and severity of the problem. Treatment may include: Fluids and pain medicines that are given through an IV. Your health care provider may tell you not to eat or drink if you have nausea or vomiting. A clear liquid diet. You may be asked to consume a clear liquid diet for several days. This allows the bowel to rest. Placement of a small tube (nasogastric tube) through the nose, down the throat, and into the stomach. This may relieve pain, discomfort, and nausea by removing blocked air and fluids from the stomach. It can also help the obstruction clear up faster. Surgery. This may be required if other treatments do not work. Surgery may be required for: Bowel obstruction from a hernia. This can be an emergency procedure. Scar tissue that causes frequent or severe obstructions. Follow these instructions at home: Medicines Take over-the-counter and prescription medicines only as told by your health care provider. If you were prescribed an antibiotic medicine, take it as told by your health care provider. Do not stop taking the antibiotic even if you start to feel better. General instructions Follow instructions from your health care provider about eating and drinking restrictions. You may need to avoid solid foods and drink only clear liquids until your condition improves. Return to your normal activities as told by your health care provider. Ask your health care provider what activities  are safe for you. Rest as told by your health care provider. Avoid sitting for a long time without moving. Get up to take short walks every 1-2 hours. This is important to improve blood flow and breathing. Ask for help if you feel weak or unsteady. Keep  all follow-up visits. This is important. How is this prevented? After having a bowel obstruction, you are more likely to have another. You may do the following things to prevent another obstruction: If you have a long-term (chronic) disease, pay attention to your symptoms and contact your health care provider if you have questions or concerns. Avoid becoming constipated. You may need to take these actions to prevent or treat constipation: Drink enough fluid to keep your urine pale yellow. Take over-the-counter or prescription medicines. Eat foods that are high in fiber, such as beans, whole grains, and fresh fruits and vegetables. Limit foods that are high in fat and processed sugars, such as fried or sweet foods. Stay active. Exercise for 30 minutes or more, 5 or more days each week. Ask your health care provider which exercises are safe for you. Avoid stress. Find ways to reduce stress, such as meditation, exercise, or taking time for activities that relax you. Instead of eating three large meals each day, eat three small meals with three small snacks. Work with a Microbiologist to make a healthy meal plan that works for you. Do not use any products that contain nicotine or tobacco. These products include cigarettes, chewing tobacco, and vaping devices, such as e-cigarettes. If you need help quitting, ask your health care provider. Contact a health care provider if: You have a fever. You have chills. Get help right away if: You have increased pain or cramping. You vomit blood. You have uncontrolled vomiting or nausea. You cannot drink fluids because of vomiting or pain. You become confused. You begin feeling very thirsty (dehydrated). You have severe bloating. You feel extremely weak or you faint. Summary A bowel obstruction is a blockage in the small or large bowel. A bowel obstruction will prevent food and fluids from passing through the bowel as they normally do during  digestion. Treatment for this condition depends on the cause and severity of the problem. It may include fluids and pain medicines through an IV, a simple diet, a nasogastric tube, or surgery. Follow instructions from your health care provider about eating restrictions. You may need to avoid solid foods and consume only clear liquids until your condition improves. This information is not intended to replace advice given to you by your health care provider. Make sure you discuss any questions you have with your health care provider. Document Revised: 10/19/2020 Document Reviewed: 10/19/2020 Elsevier Patient Education  [place].

## 2022-10-26 LAB — CMP14+EGFR
ALT: 67 IU/L — ABNORMAL HIGH (ref 0–32)
AST: 43 IU/L — ABNORMAL HIGH (ref 0–40)
Albumin/Globulin Ratio: 0.9 — ABNORMAL LOW (ref 1.2–2.2)
Albumin: 2.8 g/dL — ABNORMAL LOW (ref 3.8–4.9)
Alkaline Phosphatase: 98 IU/L (ref 44–121)
BUN/Creatinine Ratio: 20 (ref 9–23)
BUN: 12 mg/dL (ref 6–24)
Bilirubin Total: <0.2 mg/dL (ref 0.0–1.2)
CO2: 23 mmol/L (ref 20–29)
Calcium: 7.8 mg/dL — ABNORMAL LOW (ref 8.7–10.2)
Chloride: 103 mmol/L (ref 96–106)
Creatinine, Ser: 0.59 mg/dL (ref 0.57–1.00)
Globulin, Total: 3 g/dL (ref 1.5–4.5)
Glucose: 90 mg/dL (ref 70–99)
Potassium: 4.3 mmol/L (ref 3.5–5.2)
Sodium: 142 mmol/L (ref 134–144)
Total Protein: 5.8 g/dL — ABNORMAL LOW (ref 6.0–8.5)
eGFR: 105 mL/min/{1.73_m2} (ref >59)

## 2022-10-26 LAB — CBC WITH DIFFERENTIAL/PLATELET
Basophils Absolute: 0.1 10*3/uL (ref 0.0–0.2)
Basos: 1 %
EOS (ABSOLUTE): 1.2 10*3/uL — ABNORMAL HIGH (ref 0.0–0.4)
Eos: 13 %
Hematocrit: 30.9 % — ABNORMAL LOW (ref 34.0–46.6)
Hemoglobin: 9.9 g/dL — ABNORMAL LOW (ref 11.1–15.9)
Immature Grans (Abs): 0 10*3/uL (ref 0.0–0.1)
Immature Granulocytes: 0 %
Lymphocytes Absolute: 2 10*3/uL (ref 0.7–3.1)
Lymphs: 21 %
MCH: 28.6 pg (ref 26.6–33.0)
MCHC: 32 g/dL (ref 31.5–35.7)
MCV: 89 fL (ref 79–97)
Monocytes Absolute: 0.7 10*3/uL (ref 0.1–0.9)
Monocytes: 8 %
Neutrophils Absolute: 5.4 10*3/uL (ref 1.4–7.0)
Neutrophils: 57 %
Platelets: 612 10*3/uL — ABNORMAL HIGH (ref 150–450)
RBC: 3.46 x10E6/uL — ABNORMAL LOW (ref 3.77–5.28)
RDW: 11.9 % (ref 11.7–15.4)
WBC: 9.4 10*3/uL (ref 3.4–10.8)

## 2022-10-29 ENCOUNTER — Encounter: Payer: Self-pay | Admitting: Family Medicine

## 2022-10-29 ENCOUNTER — Ambulatory Visit (INDEPENDENT_AMBULATORY_CARE_PROVIDER_SITE_OTHER): Payer: Medicare HMO | Admitting: Family Medicine

## 2022-10-29 VITALS — BP 96/63 | HR 92 | Temp 98.0°F | Ht 64.0 in | Wt 118.4 lb

## 2022-10-29 DIAGNOSIS — K5900 Constipation, unspecified: Secondary | ICD-10-CM

## 2022-10-29 DIAGNOSIS — R195 Other fecal abnormalities: Secondary | ICD-10-CM

## 2022-10-29 DIAGNOSIS — D649 Anemia, unspecified: Secondary | ICD-10-CM | POA: Diagnosis not present

## 2022-10-29 LAB — HEMOGLOBIN, FINGERSTICK: Hemoglobin: 10.4 g/dL — ABNORMAL LOW (ref 11.1–15.9)

## 2022-10-29 NOTE — Progress Notes (Signed)
   Acute Office Visit  Subjective:     Patient ID: Ruth Hopkins, female    DOB: Sep 21, 1965, 56 y.o.   MRN: [id number]  Chief Complaint  Patient presents with   Stool Color Change    HPI Patient is in today for constipation. She reports that she has been having small hard bowel movements that were orange in color. Stool color is now brown. She was recently in the hospital for a partial small bowel obstruction. She had her hospital follow up with her PCP on 10/29/22. A KUB was done at that time that showed constipation. She was instructed to use miralax OTC daily, but has not done so. Her hemoglobin was low at that visit at 9.9. She does report some lightheadedness. She is not taking an iron supplement. She has a follow up appt with GI on 11/02/22. Denies abdominal pain, nausea, vomiting, diarrhea, blood in stool, rectal bleeding, or fever.    ROS As per HPI.      Objective:    BP 96/63   Pulse 92   Temp 98 F (36.7 C) (Temporal)   Ht 5' 4"$  (1.626 m)   Wt 118 lb 6 oz (53.7 kg)   BMI 20.32 kg/m  BP Readings from Last 3 Encounters:  10/29/22 96/63  10/25/22 93/64  10/11/22 99/65      Physical Exam Vitals and nursing note reviewed.  Constitutional:      General: She is not in acute distress.    Appearance: She is not ill-appearing, toxic-appearing or diaphoretic.  Cardiovascular:     Rate and Rhythm: Normal rate and regular rhythm.     Heart sounds: Normal heart sounds. No murmur heard. Pulmonary:     Effort: Pulmonary effort is normal. No respiratory distress.     Breath sounds: Normal breath sounds.  Abdominal:     General: Bowel sounds are normal. There is no distension.     Palpations: Abdomen is soft.     Tenderness: There is no abdominal tenderness. There is no guarding or rebound.  Musculoskeletal:     Right lower leg: No edema.     Left lower leg: No edema.  Skin:    General: Skin is warm.  Neurological:     General: No focal deficit present.     Mental Status:  She is alert and oriented to person, place, and time.  Psychiatric:        Mood and Affect: Mood normal.        Behavior: Behavior normal.     No results found for any visits on 10/29/22.      Assessment & Plan:   Ruth Hopkins was seen today for stool color change.  Diagnoses and all orders for this visit:  Constipation, unspecified constipation type Chronic, not well controlled. Reviewed hospital records/imaging/labs from 10/17/22 and office visit notes/imaging/labs from 10/25/22.  Discussed miralax, fiber and water intake.   Change in stool Reports orange stool. Discussed that this can be due to foods or supplement.   Anemia, unspecified type Fingerstick hgb up to 10.5, improved from 9.9. Anemia panel pending.  -     Anemia Profile B -     Hemoglobin, fingerstick  Return to office for new or worsening symptoms, or if symptoms persist.   The patient indicates understanding of these issues and agrees with the plan.   Gwenlyn Perking, FNP

## 2022-10-30 LAB — ANEMIA PROFILE B
Basophils Absolute: 0.1 10*3/uL (ref 0.0–0.2)
Basos: 1 %
EOS (ABSOLUTE): 1 10*3/uL — ABNORMAL HIGH (ref 0.0–0.4)
Eos: 12 %
Ferritin: 263 ng/mL — ABNORMAL HIGH (ref 15–150)
Folate: 14.8 ng/mL (ref >3.0)
Hematocrit: 32.6 % — ABNORMAL LOW (ref 34.0–46.6)
Hemoglobin: 10.5 g/dL — ABNORMAL LOW (ref 11.1–15.9)
Immature Grans (Abs): 0 10*3/uL (ref 0.0–0.1)
Immature Granulocytes: 0 %
Iron Saturation: 52 % (ref 15–55)
Iron: 89 ug/dL (ref 27–159)
Lymphocytes Absolute: 1.8 10*3/uL (ref 0.7–3.1)
Lymphs: 22 %
MCH: 29.1 pg (ref 26.6–33.0)
MCHC: 32.2 g/dL (ref 31.5–35.7)
MCV: 90 fL (ref 79–97)
Monocytes Absolute: 0.6 10*3/uL (ref 0.1–0.9)
Monocytes: 8 %
Neutrophils Absolute: 4.7 10*3/uL (ref 1.4–7.0)
Neutrophils: 57 %
Platelets: 648 10*3/uL — ABNORMAL HIGH (ref 150–450)
RBC: 3.61 x10E6/uL — ABNORMAL LOW (ref 3.77–5.28)
RDW: 12.3 % (ref 11.7–15.4)
Retic Ct Pct: 2.1 % (ref 0.6–2.6)
Total Iron Binding Capacity: 170 ug/dL — ABNORMAL LOW (ref 250–450)
UIBC: 81 ug/dL — ABNORMAL LOW (ref 131–425)
Vitamin B-12: >2000 pg/mL — ABNORMAL HIGH (ref 232–1245)
WBC: 8.2 10*3/uL (ref 3.4–10.8)

## 2022-11-01 ENCOUNTER — Telehealth: Payer: Self-pay | Admitting: Family

## 2022-11-01 NOTE — Telephone Encounter (Signed)
Aware of results, Patient is asking if she can take iron otc. She is aware it could constipate her so should she hold off? Aware she will not get a call back until tomorrow.

## 2022-11-02 DIAGNOSIS — K921 Melena: Secondary | ICD-10-CM | POA: Diagnosis not present

## 2022-11-02 DIAGNOSIS — R1010 Upper abdominal pain, unspecified: Secondary | ICD-10-CM | POA: Diagnosis not present

## 2022-11-02 NOTE — Telephone Encounter (Signed)
Patient aware and verbalized understanding. °

## 2022-11-02 NOTE — Telephone Encounter (Signed)
Pt's iron levels is normal, does not need to take.

## 2022-11-05 ENCOUNTER — Ambulatory Visit (INDEPENDENT_AMBULATORY_CARE_PROVIDER_SITE_OTHER): Payer: Medicare HMO | Admitting: Family

## 2022-11-05 ENCOUNTER — Encounter: Payer: Self-pay | Admitting: Family

## 2022-11-05 VITALS — BP 84/57 | HR 98 | Temp 97.8°F | Ht 64.0 in | Wt 120.2 lb

## 2022-11-05 DIAGNOSIS — K921 Melena: Secondary | ICD-10-CM | POA: Diagnosis not present

## 2022-11-05 DIAGNOSIS — I959 Hypotension, unspecified: Secondary | ICD-10-CM | POA: Diagnosis not present

## 2022-11-05 DIAGNOSIS — R5383 Other fatigue: Secondary | ICD-10-CM

## 2022-11-05 DIAGNOSIS — K648 Other hemorrhoids: Secondary | ICD-10-CM

## 2022-11-05 LAB — URINALYSIS, COMPLETE
Bilirubin, UA: NEGATIVE
Glucose, UA: NEGATIVE
Leukocytes,UA: NEGATIVE
Nitrite, UA: NEGATIVE
Specific Gravity, UA: 1.025 (ref 1.005–1.030)
Urobilinogen, Ur: 1 mg/dL (ref 0.2–1.0)
pH, UA: 5.5 (ref 5.0–7.5)

## 2022-11-05 LAB — MICROSCOPIC EXAMINATION
RBC, Urine: NONE SEEN /hpf (ref 0–2)
Renal Epithel, UA: NONE SEEN /hpf
WBC, UA: NONE SEEN /hpf (ref 0–5)

## 2022-11-05 LAB — HEMOGLOBIN, FINGERSTICK: Hemoglobin: 9.8 g/dL — ABNORMAL LOW (ref 11.1–15.9)

## 2022-11-05 MED ORDER — HYDROCORTISONE ACETATE 25 MG RE SUPP: 25.0000 mg | Freq: Two times a day (BID) | RECTAL | 0 refills | Status: DC

## 2022-11-05 NOTE — Progress Notes (Signed)
Subjective:    Patient ID: Ruth Hopkins, female    DOB: 1966-06-07, 57 y.o.   MRN: [id number]  Chief Complaint  Patient presents with   Blood In Stools   Hypotension   PT presents to the office today with hypotension and noticed bleeding in her stool. She reports hemorrhoids in the past, but more blood than usual.   She saw GI on 11/02/22 and thought this was related to hemorrhoids, but set up to have colonoscopy in March.   Rectal Bleeding  The current episode started 5 to 7 days ago. The onset was gradual. The problem occurs frequently. The problem has been gradually worsening. The pain is mild. Prior successful therapies include laxatives. Associated symptoms include hemorrhoids. Pertinent negatives include no fever, no abdominal pain, no nausea and no rectal pain.  Urinary Frequency  This is a recurrent problem. The current episode started 1 to 4 weeks ago. The problem occurs intermittently. The problem has been waxing and waning. The patient is experiencing no pain. Associated symptoms include frequency. Pertinent negatives include no nausea or urgency.      Review of Systems  Constitutional:  Negative for fever.  Gastrointestinal:  Positive for hematochezia and hemorrhoids. Negative for abdominal pain, nausea and rectal pain.  Genitourinary:  Positive for frequency. Negative for urgency.  All other systems reviewed and are negative.      Objective:   Physical Exam Vitals reviewed.  Constitutional:      General: She is not in acute distress.    Appearance: She is well-developed.  HENT:     Head: Normocephalic and atraumatic.     Right Ear: Tympanic membrane normal.     Left Ear: Tympanic membrane normal.  Eyes:     Pupils: Pupils are equal, round, and reactive to light.  Neck:     Thyroid: No thyromegaly.  Cardiovascular:     Rate and Rhythm: Normal rate and regular rhythm.     Heart sounds: Normal heart sounds. No murmur heard. Pulmonary:     Effort: Pulmonary  effort is normal. No respiratory distress.     Breath sounds: Normal breath sounds. No wheezing.  Abdominal:     General: Bowel sounds are normal. There is no distension.     Palpations: Abdomen is soft.     Tenderness: There is no abdominal tenderness.  Musculoskeletal:        General: No tenderness. Normal range of motion.     Cervical back: Normal range of motion and neck supple.  Skin:    General: Skin is warm and dry.  Neurological:     Mental Status: She is alert and oriented to person, place, and time.     Cranial Nerves: No cranial nerve deficit.     Deep Tendon Reflexes: Reflexes are normal and symmetric.  Psychiatric:        Behavior: Behavior normal.        Thought Content: Thought content normal.        Judgment: Judgment normal.       BP (!) 84/57   Pulse 98   Temp 97.8 F (36.6 C) (Temporal)   Ht 5' 4"$  (1.626 m)   Wt 120 lb 3.2 oz (54.5 kg)   BMI 20.63 kg/m      Assessment & Plan:  Ruth Hopkins comes in today with chief complaint of Blood In Stools and Hypotension   Diagnosis and orders addressed:  1. Other fatigue - Urinalysis, Complete - Urine Culture -  CMP14+EGFR  2. Hypotension, unspecified hypotension typ - CMP14+EGFR - Anemia Profile B - Hemoglobin, fingerstick  3. Blood in stool  - CMP14+EGFR - Hemoglobin, fingerstick  4. Internal hemorrhoids Suppositories as needed  - CMP14+EGFR - hydrocortisone (ANUSOL-HC) 25 MG suppository; Place 1 suppository (25 mg total) rectally 2 (two) times daily.  Dispense: 12 suppository; Refill: 0    Labs pending, Hgb stable Force fluids May need to decrease Klonopin given BP Keep follow up with GI Red flags discussed to go to ED Health Maintenance reviewed Diet and exercise encouraged  Follow up plan: 1 week to recheck Hgb   Evelina Dun, FNP

## 2022-11-05 NOTE — Patient Instructions (Signed)

## 2022-11-06 ENCOUNTER — Encounter: Payer: Self-pay | Admitting: Family Medicine

## 2022-11-06 LAB — ANEMIA PROFILE B
Basophils Absolute: 0.1 10*3/uL (ref 0.0–0.2)
Basos: 1 %
EOS (ABSOLUTE): 1.8 10*3/uL — ABNORMAL HIGH (ref 0.0–0.4)
Eos: 20 %
Ferritin: 256 ng/mL — ABNORMAL HIGH (ref 15–150)
Folate: 16 ng/mL (ref >3.0)
Hematocrit: 31.4 % — ABNORMAL LOW (ref 34.0–46.6)
Hemoglobin: 10.1 g/dL — ABNORMAL LOW (ref 11.1–15.9)
Immature Grans (Abs): 0 10*3/uL (ref 0.0–0.1)
Immature Granulocytes: 0 %
Iron Saturation: 27 % (ref 15–55)
Iron: 56 ug/dL (ref 27–159)
Lymphocytes Absolute: 2 10*3/uL (ref 0.7–3.1)
Lymphs: 22 %
MCH: 29.4 pg (ref 26.6–33.0)
MCHC: 32.2 g/dL (ref 31.5–35.7)
MCV: 92 fL (ref 79–97)
Monocytes Absolute: 0.8 10*3/uL (ref 0.1–0.9)
Monocytes: 8 %
Neutrophils Absolute: 4.6 10*3/uL (ref 1.4–7.0)
Neutrophils: 49 %
Platelets: 455 10*3/uL — ABNORMAL HIGH (ref 150–450)
RBC: 3.43 x10E6/uL — ABNORMAL LOW (ref 3.77–5.28)
RDW: 13 % (ref 11.7–15.4)
Retic Ct Pct: 3 % — ABNORMAL HIGH (ref 0.6–2.6)
Total Iron Binding Capacity: 205 ug/dL — ABNORMAL LOW (ref 250–450)
UIBC: 149 ug/dL (ref 131–425)
Vitamin B-12: 1523 pg/mL — ABNORMAL HIGH (ref 232–1245)
WBC: 9.3 10*3/uL (ref 3.4–10.8)

## 2022-11-06 LAB — CMP14+EGFR
ALT: 45 IU/L — ABNORMAL HIGH (ref 0–32)
AST: 29 IU/L (ref 0–40)
Albumin/Globulin Ratio: 1.2 (ref 1.2–2.2)
Albumin: 3.1 g/dL — ABNORMAL LOW (ref 3.8–4.9)
Alkaline Phosphatase: 89 IU/L (ref 44–121)
BUN/Creatinine Ratio: 21 (ref 9–23)
BUN: 15 mg/dL (ref 6–24)
Bilirubin Total: <0.2 mg/dL (ref 0.0–1.2)
CO2: 25 mmol/L (ref 20–29)
Calcium: 8.3 mg/dL — ABNORMAL LOW (ref 8.7–10.2)
Chloride: 100 mmol/L (ref 96–106)
Creatinine, Ser: 0.7 mg/dL (ref 0.57–1.00)
Globulin, Total: 2.6 g/dL (ref 1.5–4.5)
Glucose: 92 mg/dL (ref 70–99)
Potassium: 4 mmol/L (ref 3.5–5.2)
Sodium: 138 mmol/L (ref 134–144)
Total Protein: 5.7 g/dL — ABNORMAL LOW (ref 6.0–8.5)
eGFR: 101 mL/min/{1.73_m2} (ref >59)

## 2022-11-08 LAB — URINE CULTURE: Organism ID, Bacteria: NO GROWTH

## 2022-11-10 DIAGNOSIS — F411 Generalized anxiety disorder: Secondary | ICD-10-CM | POA: Diagnosis not present

## 2022-11-10 DIAGNOSIS — F330 Major depressive disorder, recurrent, mild: Secondary | ICD-10-CM | POA: Diagnosis not present

## 2022-11-10 DIAGNOSIS — R69 Illness, unspecified: Secondary | ICD-10-CM | POA: Diagnosis not present

## 2022-11-11 ENCOUNTER — Encounter: Payer: Self-pay | Admitting: Family

## 2022-11-11 ENCOUNTER — Ambulatory Visit (INDEPENDENT_AMBULATORY_CARE_PROVIDER_SITE_OTHER): Payer: Medicare HMO | Admitting: Family

## 2022-11-11 VITALS — BP 88/59 | HR 91 | Temp 97.5°F | Ht 64.0 in | Wt 121.8 lb

## 2022-11-11 DIAGNOSIS — K648 Other hemorrhoids: Secondary | ICD-10-CM

## 2022-11-11 DIAGNOSIS — I959 Hypotension, unspecified: Secondary | ICD-10-CM

## 2022-11-11 DIAGNOSIS — M79604 Pain in right leg: Secondary | ICD-10-CM

## 2022-11-11 DIAGNOSIS — D649 Anemia, unspecified: Secondary | ICD-10-CM

## 2022-11-11 DIAGNOSIS — K219 Gastro-esophageal reflux disease without esophagitis: Secondary | ICD-10-CM

## 2022-11-11 DIAGNOSIS — M545 Low back pain, unspecified: Secondary | ICD-10-CM | POA: Diagnosis not present

## 2022-11-11 DIAGNOSIS — E039 Hypothyroidism, unspecified: Secondary | ICD-10-CM | POA: Diagnosis not present

## 2022-11-11 MED ORDER — METHOCARBAMOL 500 MG PO TABS: 500.0000 mg | ORAL_TABLET | Freq: Four times a day (QID) | ORAL | 5 refills | Status: DC | PRN

## 2022-11-11 NOTE — Patient Instructions (Signed)
Hypotension As the heart beats, it forces blood through the body. Hypotension, commonly called low blood pressure, is when the force of blood pumping through the arteries is too weak. Arteries are blood vessels that carry blood from the heart throughout the body. Depending on the cause and severity, hypotension may be harmless (benign) or may cause serious problems (be critical). When your blood pressure is too low, you may not get enough blood to your brain or to the rest of your organs. This can cause weakness, light-headedness, a rapid heartbeat, and fainting. What are the causes? This condition may be caused by: Blood loss. Loss of body fluids (dehydration). Heart problems. Hormone (endocrine) problems. Pregnancy. Severe infection. Lack of certain nutrients. Severe allergic reactions (anaphylaxis). Certain medicines, such as blood pressure medicine or medicines that make the body lose excess fluids (diuretics). Sometimes, hypotension may be caused by not taking medicine as directed, such as taking too much of a certain medicine. What increases the risk? The following factors may make you more likely to develop this condition: Age. Risk increases as you get older. Having a condition that affects the heart or the central nervous system. What are the signs or symptoms? Common symptoms of this condition include: Weakness. Light-headedness. Dizziness. Blurred vision. Tiredness (fatigue). Rapid heartbeat. Fainting, in severe cases. How is this diagnosed? This condition is diagnosed based on: Your medical history. Your symptoms. Your blood pressure measurement. Your health care provider will check your blood pressure when you are: Lying down. Sitting. Standing. A blood pressure reading is recorded as two numbers, such as "120 over 80" (or 120/80). The first ("top") number is called the systolic pressure. It is a measure of the pressure in your arteries as your heart beats. The second  ("bottom") number is called the diastolic pressure. It is a measure of the pressure in your arteries when your heart relaxes between beats. Blood pressure is measured in a unit called mm Hg. Healthy blood pressure for most adults is 120/80. If your blood pressure is below 90/60, you may be diagnosed with hypotension. Other information or tests that may be used to diagnose hypotension include: Your other vital signs, such as your heart rate and temperature. Blood tests. Tilt table test. For this test, you will be safely secured to a table that moves you from a lying position to an upright position. Your heart rhythm and blood pressure will be monitored during the test. How is this treated? Treatment for this condition may include: Changing your diet. This may involve drinking more water or increasing your salt (sodium) intake with high-sodium foods. Taking medicines to raise your blood pressure. Changing the dosage of certain medicines you are taking that might be lowering your blood pressure. Wearing compression stockings. These stockings help to prevent blood clots and reduce swelling in your legs. In some cases, you may need to go to the hospital for: Fluid replacement. This means you will receive fluids through an IV. Blood replacement. This means you will receive donated blood through an IV (transfusion). Treating an infection or heart problems, if this applies. Monitoring. You may need to be monitored while medicines that you are taking wear off. Follow these instructions at home: Eating and drinking  Drink enough fluid to keep your urine pale yellow. Eat a healthy diet, and follow instructions from your health care provider about eating or drinking restrictions. A healthy diet includes: Fresh fruits and vegetables. Whole grains. Lean meats. Low-fat dairy products. Increase your salt intake if told  to do so. Do not add extra salt to your diet unless your health care provider tells you  to do that. Eat frequent, small meals. Avoid standing up suddenly after eating. Medicines Take over-the-counter and prescription medicines only as told by your health care provider. Follow instructions from your health care provider about changing the dosage of your current medicines, if this applies. Do not stop or adjust any of your medicines on your own. General instructions  Wear compression stockings as told by your health care provider. Get up slowly from lying down or sitting positions. This gives your blood pressure a chance to adjust. Avoid hot showers and excessive heat as directed by your health care provider. Return to your normal activities as told by your health care provider. Ask your health care provider what activities are safe for you. Do not use any products that contain nicotine or tobacco. These products include cigarettes, chewing tobacco, and vaping devices, such as e-cigarettes. If you need help quitting, ask your health care provider. Keep all follow-up visits. This is important. Contact a health care provider if: You vomit. You have diarrhea. You have a fever for more than 2-3 days. You feel more thirsty than usual. You feel weak and tired. Get help right away if: You have chest pain. You have a fast or irregular heartbeat. You develop numbness in any part of your body. You cannot move your arms or your legs. You have trouble speaking. You become sweaty or feel light-headed. You faint. You feel short of breath. You have trouble staying awake. You feel confused. These symptoms may be an emergency. Get help right away. Call 911. Do not wait to see if the symptoms will go away. Do not drive yourself to the hospital. Summary Hypotension is when the force of blood pumping through the arteries is too weak. Hypotension may be harmless (benign) or may cause serious problems (be critical). Treatment for this condition may include changing your diet, changing  your medicines, and wearing compression stockings. In some cases, you may need to go to the hospital for fluid or blood replacement. This information is not intended to replace advice given to you by your health care provider. Make sure you discuss any questions you have with your health care provider. Document Revised: 04/27/2021 Document Reviewed: 04/27/2021 Elsevier Patient Education  [place].

## 2022-11-11 NOTE — Progress Notes (Signed)
Subjective:    Patient ID: Ruth Hopkins, female    DOB: [birth year], 57 y.o.   MRN: [id number]  Chief Complaint  Patient presents with   Medical Management of Chronic Issues    PT presents to the office today with hypotension and noticed bleeding in her stool. She reports hemorrhoids in the past, but more blood than usual.    She saw GI on 11/02/22 and thought this was related to hemorrhoids, but set up to have colonoscopy in March.  She continues to have hypotension.   Quit smoking 11/02/22. Gastroesophageal Reflux She complains of belching and heartburn. She reports no hoarse voice. This is a chronic problem. The current episode started more than 1 year ago. The problem occurs occasionally. She has tried a PPI for the symptoms. The treatment provided moderate relief.  Thyroid Problem Presents for follow-up visit. Symptoms include constipation. Patient reports no anxiety, diarrhea or hoarse voice. The symptoms have been stable.  Back Pain This is a chronic problem. The current episode started more than 1 year ago. The problem has been waxing and waning since onset. The pain is present in the lumbar spine. The pain is at a severity of 8/10. The pain is moderate. The symptoms are aggravated by bending. She has tried muscle relaxant for the symptoms. The treatment provided mild relief.      Review of Systems  HENT:  Negative for hoarse voice.   Gastrointestinal:  Positive for constipation and heartburn. Negative for diarrhea.  Musculoskeletal:  Positive for back pain.  Psychiatric/Behavioral:  The patient is not nervous/anxious.   All other systems reviewed and are negative.      Objective:   Physical Exam Vitals reviewed.  Constitutional:      General: She is not in acute distress.    Appearance: She is well-developed.  HENT:     Head: Normocephalic and atraumatic.     Right Ear: Tympanic membrane normal.     Left Ear: Tympanic membrane normal.  Eyes:     Pupils: Pupils  are equal, round, and reactive to light.  Neck:     Thyroid: No thyromegaly.  Cardiovascular:     Rate and Rhythm: Normal rate and regular rhythm.     Heart sounds: Normal heart sounds. No murmur heard. Pulmonary:     Effort: Pulmonary effort is normal. No respiratory distress.     Breath sounds: Normal breath sounds. No wheezing.  Abdominal:     General: Bowel sounds are normal. There is no distension.     Palpations: Abdomen is soft.     Tenderness: There is no abdominal tenderness.  Musculoskeletal:        General: No tenderness. Normal range of motion.     Cervical back: Normal range of motion and neck supple.  Skin:    General: Skin is warm and dry.  Neurological:     Mental Status: She is alert and oriented to person, place, and time.     Cranial Nerves: No cranial nerve deficit.     Deep Tendon Reflexes: Reflexes are normal and symmetric.  Psychiatric:        Behavior: Behavior normal.        Thought Content: Thought content normal.        Judgment: Judgment normal.         BP (!) 88/59   Pulse 91   Temp (!) 97.5 F (36.4 C) (Temporal)   Ht 5' 4"$  (1.626 m)   Wt 121 lb  12.8 oz (55.2 kg)   SpO2 94%   BMI 20.91 kg/m   Assessment & Plan:  Ruth Hopkins comes in today with chief complaint of Medical Management of Chronic Issues   Diagnosis and orders addressed:  1. Acquired hypothyroidism - CBC with Differential/Platelet  2. Gastroesophageal reflux disease, unspecified whether esophagitis present - CBC with Differential/Platelet  3. Internal hemorrhoids - CBC with Differential/Platelet  4. Lumbar pain with radiation down right leg Do not take Robaxin as needed Discussed this could cause hypotension - CBC with Differential/Platelet - methocarbamol (ROBAXIN) 500 MG tablet; Take 1 tablet (500 mg total) by mouth every 6 (six) hours as needed for muscle spasms.  Dispense: 30 tablet; Refill: 5  5. Hypotension, unspecified hypotension type If continues to be  low may need a referral to Cardiologists  - CBC with Differential/Platelet  6. Low hemoglobin - CBC with Differential/Platelet   Labs pending Health Maintenance reviewed Diet and exercise encouraged  Follow up plan: 3 months   Evelina Dun, FNP

## 2022-11-12 LAB — CBC WITH DIFFERENTIAL/PLATELET
Basophils Absolute: 0.1 10*3/uL (ref 0.0–0.2)
Basos: 1 %
EOS (ABSOLUTE): 1.5 10*3/uL — ABNORMAL HIGH (ref 0.0–0.4)
Eos: 19 %
Hematocrit: 31.8 % — ABNORMAL LOW (ref 34.0–46.6)
Hemoglobin: 10.1 g/dL — ABNORMAL LOW (ref 11.1–15.9)
Immature Grans (Abs): 0 10*3/uL (ref 0.0–0.1)
Immature Granulocytes: 0 %
Lymphocytes Absolute: 2 10*3/uL (ref 0.7–3.1)
Lymphs: 26 %
MCH: 28.6 pg (ref 26.6–33.0)
MCHC: 31.8 g/dL (ref 31.5–35.7)
MCV: 90 fL (ref 79–97)
Monocytes Absolute: 0.5 10*3/uL (ref 0.1–0.9)
Monocytes: 6 %
Neutrophils Absolute: 3.6 10*3/uL (ref 1.4–7.0)
Neutrophils: 48 %
Platelets: 352 10*3/uL (ref 150–450)
RBC: 3.53 x10E6/uL — ABNORMAL LOW (ref 3.77–5.28)
RDW: 13.5 % (ref 11.7–15.4)
WBC: 7.7 10*3/uL (ref 3.4–10.8)

## 2022-11-13 ENCOUNTER — Other Ambulatory Visit: Payer: Self-pay | Admitting: Family

## 2022-11-13 DIAGNOSIS — M79604 Pain in right leg: Secondary | ICD-10-CM

## 2022-11-15 NOTE — Telephone Encounter (Signed)
tiZANidine (ZANAFLEX) 2 MG tablet        Changed from: methocarbamol (ROBAXIN) 500 MG tablet    Pharmacy comment: Please consider alternative and respond with prescription changes or request a Prior Authorization by calling [phone]. Please consider alternative and respond with prescription changes or request a Prior Authorization by calling [phone].

## 2022-11-16 ENCOUNTER — Ambulatory Visit: Payer: Medicare HMO | Admitting: Dermatology

## 2022-11-16 ENCOUNTER — Other Ambulatory Visit: Payer: Self-pay | Admitting: Family Medicine

## 2022-11-16 ENCOUNTER — Telehealth: Payer: Self-pay | Admitting: Family

## 2022-11-16 DIAGNOSIS — D649 Anemia, unspecified: Secondary | ICD-10-CM

## 2022-11-16 NOTE — Telephone Encounter (Signed)
Patient called requesting to speak with nurse regarding her iron results. Has questions.

## 2022-11-16 NOTE — Telephone Encounter (Signed)
CALLED PATIENT. PATIENT COMING TO GET CBC THIS WEEK TO RECHECK ORDERS PLACED

## 2022-11-19 ENCOUNTER — Other Ambulatory Visit: Payer: Medicare HMO

## 2022-11-19 DIAGNOSIS — D649 Anemia, unspecified: Secondary | ICD-10-CM | POA: Diagnosis not present

## 2022-11-20 LAB — CBC WITH DIFFERENTIAL/PLATELET
Basophils Absolute: 0.1 10*3/uL (ref 0.0–0.2)
Basos: 1 %
EOS (ABSOLUTE): 1.4 10*3/uL — ABNORMAL HIGH (ref 0.0–0.4)
Eos: 19 %
Hematocrit: 30.4 % — ABNORMAL LOW (ref 34.0–46.6)
Hemoglobin: 9.9 g/dL — ABNORMAL LOW (ref 11.1–15.9)
Immature Grans (Abs): 0 10*3/uL (ref 0.0–0.1)
Immature Granulocytes: 0 %
Lymphocytes Absolute: 2.7 10*3/uL (ref 0.7–3.1)
Lymphs: 36 %
MCH: 30 pg (ref 26.6–33.0)
MCHC: 32.6 g/dL (ref 31.5–35.7)
MCV: 92 fL (ref 79–97)
Monocytes Absolute: 0.4 10*3/uL (ref 0.1–0.9)
Monocytes: 5 %
Neutrophils Absolute: 2.9 10*3/uL (ref 1.4–7.0)
Neutrophils: 39 %
Platelets: 348 10*3/uL (ref 150–450)
RBC: 3.3 x10E6/uL — ABNORMAL LOW (ref 3.77–5.28)
RDW: 14.1 % (ref 11.7–15.4)
WBC: 7.5 10*3/uL (ref 3.4–10.8)

## 2022-11-22 ENCOUNTER — Other Ambulatory Visit: Payer: Self-pay | Admitting: Family Medicine

## 2022-11-24 DIAGNOSIS — R92333 Mammographic heterogeneous density, bilateral breasts: Secondary | ICD-10-CM | POA: Diagnosis not present

## 2022-11-24 DIAGNOSIS — Z1231 Encounter for screening mammogram for malignant neoplasm of breast: Secondary | ICD-10-CM | POA: Diagnosis not present

## 2022-11-24 DIAGNOSIS — Z86000 Personal history of in-situ neoplasm of breast: Secondary | ICD-10-CM | POA: Diagnosis not present

## 2022-11-24 LAB — HM MAMMOGRAPHY

## 2022-11-29 ENCOUNTER — Telehealth (INDEPENDENT_AMBULATORY_CARE_PROVIDER_SITE_OTHER): Payer: Medicare HMO | Admitting: Family Medicine

## 2022-11-29 ENCOUNTER — Encounter: Payer: Self-pay | Admitting: Family Medicine

## 2022-11-29 DIAGNOSIS — G43109 Migraine with aura, not intractable, without status migrainosus: Secondary | ICD-10-CM

## 2022-11-29 DIAGNOSIS — H8309 Labyrinthitis, unspecified ear: Secondary | ICD-10-CM

## 2022-11-29 MED ORDER — MECLIZINE HCL 50 MG PO TABS: 50.0000 mg | ORAL_TABLET | Freq: Three times a day (TID) | ORAL | 0 refills | Status: DC | PRN

## 2022-11-29 NOTE — Progress Notes (Signed)
Subjective:    Patient ID: Ruth Hopkins, female    DOB: 11-23-1965, 57 y.o.   MRN: [id number]   HPI: Ruth Hopkins is a 57 y.o. female presenting for episodes of dizziness with walking, veers off to the left. Last week drove to [place], walked to a building and had to grab and hold onto something to avoid falling. Looked down in the kitchen once, felt off balance, then had a spinning sensation. Got clammy, sweaty, lasted 5 minutes. BP was 127/82 at that time. Usually 110/70. Denies fever. NKI. Once had ripples in her vision to the lasting 3-4 minutes. Was not associated with dizziness.       11/11/2022    2:05 PM 11/05/2022    2:32 PM 10/29/2022   12:21 PM 10/25/2022   11:57 AM 10/11/2022   12:45 PM  Depression screen PHQ 2/9  Decreased Interest 0 0 0 0 0  Down, Depressed, Hopeless '1 1 1 1 1  '$ PHQ - 2 Score '1 1 1 1 1  '$ Altered sleeping '1 1 1 1 1  '$ Tired, decreased energy 0 0 1 0 0  Change in appetite 0 0 0 0 0  Feeling bad or failure about yourself  0 0 0 0 0  Trouble concentrating 0 0 0 0 0  Moving slowly or fidgety/restless 0 0 0 0 0  Suicidal thoughts 0 0 0 0 0  PHQ-9 Score '2 2 3 2 2  '$ Difficult doing work/chores Somewhat difficult Somewhat difficult Somewhat difficult Not difficult at all Somewhat difficult     Relevant past medical, surgical, family and social history reviewed and updated as indicated.  Interim medical history since our last visit reviewed. Allergies and medications reviewed and updated.  ROS:  Review of Systems  Constitutional: Negative.   HENT: Negative.    Eyes:  Negative for visual disturbance.  Respiratory:  Negative for shortness of breath.   Cardiovascular:  Negative for chest pain.  Gastrointestinal:  Negative for abdominal pain.  Musculoskeletal:  Negative for arthralgias.  Neurological:  Positive for dizziness and headaches.     Social History   Tobacco Use  Smoking Status Former   Packs/day: 1.00   Years: 35.00   Total pack years: 35.00    Types: Cigarettes   Quit date: 11/02/2022   Years since quitting: 0.0  Smokeless Tobacco Never  Tobacco Comments   Smoking since age 51 - 1ppd usually - has tried to quit several times - is afraid it will make her gain weight       Objective:     Wt Readings from Last 3 Encounters:  11/11/22 121 lb 12.8 oz (55.2 kg)  11/05/22 120 lb 3.2 oz (54.5 kg)  10/29/22 118 lb 6 oz (53.7 kg)     Normal appearing. A&O X 3. CN 2-12 grossly intact.   Assessment & Plan:   1. Labyrinthitis, unspecified laterality   2. Vertiginous migraine     Meds ordered this encounter  Medications   meclizine (ANTIVERT) 50 MG tablet    Sig: Take 1 tablet (50 mg total) by mouth 3 (three) times daily as needed.    Dispense:  30 tablet    Refill:  0    No orders of the defined types were placed in this encounter.     Diagnoses and all orders for this visit:  Labyrinthitis, unspecified laterality  Vertiginous migraine  Other orders -     meclizine (ANTIVERT) 50 MG tablet; Take 1 tablet (  50 mg total) by mouth 3 (three) times daily as needed.    Virtual Visit via telephone Note  I discussed the limitations, risks, security and privacy concerns of performing an evaluation and management service by telephone and the availability of in person appointments. The patient was identified with two identifiers. Pt.expressed understanding and agreed to proceed. Pt. Is at home. Dr. Livia Snellen is in his office.  Follow Up Instructions:   I discussed the assessment and treatment plan with the patient. The patient was provided an opportunity to ask questions and all were answered. The patient agreed with the plan and demonstrated an understanding of the instructions.   The patient was advised to call back or seek an in-person evaluation if the symptoms worsen or if the condition fails to improve as anticipated.   Total minutes including chart review and phone contact time: 1   Follow up plan: Return if  symptoms worsen or fail to improve.  Claretta Fraise, MD [place]

## 2022-12-08 ENCOUNTER — Encounter: Payer: Self-pay | Admitting: Family Medicine

## 2022-12-08 ENCOUNTER — Telehealth (INDEPENDENT_AMBULATORY_CARE_PROVIDER_SITE_OTHER): Payer: Medicare HMO | Admitting: Family Medicine

## 2022-12-08 DIAGNOSIS — M545 Low back pain, unspecified: Secondary | ICD-10-CM | POA: Diagnosis not present

## 2022-12-08 DIAGNOSIS — M79604 Pain in right leg: Secondary | ICD-10-CM | POA: Diagnosis not present

## 2022-12-08 MED ORDER — PREDNISONE 10 MG PO TABS: ORAL_TABLET | ORAL | 0 refills | Status: DC

## 2022-12-08 NOTE — Progress Notes (Signed)
Subjective:    Patient ID: Ruth Hopkins, female    DOB: Apr 20, 1966, 57 y.o.   MRN: [id number]   HPI: Ruth Hopkins is a 57 y.o. female presenting for going off antiinflammatory in early February. Having back pain when she wakes up. Can't turn over Tylenol helps, except yesterday did housework. Today hurts in back and hips. Concerned about going into the legs.  10/10 upon arising this AM. 8/10 currently.  Took a diclofenac this morning even though she had been taken off of it during hospitalization last month for an ileus.Review of lab shows good liver and kidney function.      11/11/2022    2:05 PM 11/05/2022    2:32 PM 10/29/2022   12:21 PM 10/25/2022   11:57 AM 10/11/2022   12:45 PM  Depression screen PHQ 2/9  Decreased Interest 0 0 0 0 0  Down, Depressed, Hopeless 1 1 1 1 1   PHQ - 2 Score 1 1 1 1 1   Altered sleeping 1 1 1 1 1   Tired, decreased energy 0 0 1 0 0  Change in appetite 0 0 0 0 0  Feeling bad or failure about yourself  0 0 0 0 0  Trouble concentrating 0 0 0 0 0  Moving slowly or fidgety/restless 0 0 0 0 0  Suicidal thoughts 0 0 0 0 0  PHQ-9 Score 2 2 3 2 2   Difficult doing work/chores Somewhat difficult Somewhat difficult Somewhat difficult Not difficult at all Somewhat difficult     Relevant past medical, surgical, family and social history reviewed and updated as indicated.  Interim medical history since our last visit reviewed. Allergies and medications reviewed and updated.  ROS:  Review of Systems  Constitutional: Negative.   HENT: Negative.    Respiratory:  Negative for shortness of breath.   Cardiovascular:  Negative for chest pain.  Gastrointestinal:  Negative for abdominal pain.  Musculoskeletal:  Positive for back pain.     Social History   Tobacco Use  Smoking Status Former   Packs/day: 1.00   Years: 35.00   Additional pack years: 0.00   Total pack years: 35.00   Types: Cigarettes   Quit date: 11/02/2022   Years since quitting: 0.0  Smokeless  Tobacco Never  Tobacco Comments   Smoking since age 21 - 1ppd usually - has tried to quit several times - is afraid it will make her gain weight       Objective:     Wt Readings from Last 3 Encounters:  11/11/22 121 lb 12.8 oz (55.2 kg)  11/05/22 120 lb 3.2 oz (54.5 kg)  10/29/22 118 lb 6 oz (53.7 kg)     Video revealed alert and oriented pt. In some distress due to pain. Nml mood and cognition.   Assessment & Plan:   1. Lumbar pain with radiation down right leg     Meds ordered this encounter  Medications   predniSONE (DELTASONE) 10 MG tablet    Sig: Take 5 daily for 3 days followed by 4,3,2 and 1 for 3 days each.    Dispense:  45 tablet    Refill:  0    Low back pain exercise handout routed to MyChart as discussed.    Diagnoses and all orders for this visit:  Lumbar pain with radiation down right leg  Other orders -     predniSONE (DELTASONE) 10 MG tablet; Take 5 daily for 3 days followed by 4,3,2 and 1  for 3 days each.    Virtual Visit via telephone Note  I discussed the limitations, risks, security and privacy concerns of performing an evaluation and management service by telephone and the availability of in person appointments. The patient was identified with two identifiers. Pt.expressed understanding and agreed to proceed. Pt. Is at home. Dr. Livia Snellen is in his office.  Follow Up Instructions:   I discussed the assessment and treatment plan with the patient. The patient was provided an opportunity to ask questions and all were answered. The patient agreed with the plan and demonstrated an understanding of the instructions.   The patient was advised to call back or seek an in-person evaluation if the symptoms worsen or if the condition fails to improve as anticipated.   Total minutes including chart review and phone contact time: 16   Follow up plan: Return if symptoms worsen or fail to improve.  Ruth Fraise, MD [place]

## 2022-12-08 NOTE — Patient Instructions (Signed)

## 2022-12-09 ENCOUNTER — Ambulatory Visit: Payer: Medicare HMO

## 2022-12-13 ENCOUNTER — Encounter: Payer: Self-pay | Admitting: Family

## 2022-12-13 ENCOUNTER — Ambulatory Visit (INDEPENDENT_AMBULATORY_CARE_PROVIDER_SITE_OTHER): Payer: Medicare HMO | Admitting: Family

## 2022-12-13 VITALS — BP 99/69 | HR 83 | Temp 97.7°F | Ht 64.0 in | Wt 126.2 lb

## 2022-12-13 DIAGNOSIS — K921 Melena: Secondary | ICD-10-CM | POA: Diagnosis not present

## 2022-12-13 DIAGNOSIS — F17200 Nicotine dependence, unspecified, uncomplicated: Secondary | ICD-10-CM

## 2022-12-13 DIAGNOSIS — I959 Hypotension, unspecified: Secondary | ICD-10-CM

## 2022-12-13 DIAGNOSIS — R69 Illness, unspecified: Secondary | ICD-10-CM | POA: Diagnosis not present

## 2022-12-13 DIAGNOSIS — K219 Gastro-esophageal reflux disease without esophagitis: Secondary | ICD-10-CM

## 2022-12-13 NOTE — Progress Notes (Signed)
   Subjective:    Patient ID: Ruth Hopkins, female    DOB: 1966/06/12, 57 y.o.   MRN: [id number]  Chief Complaint  Patient presents with   Follow-up   PT presents to the office today with hypotension and noticed bleeding in her stool. She reports hemorrhoids in the past, but more blood than usual. Reports this is slightly improving.    She is followed by GI and has colonoscopy scheduled next month.    She continues to have hypotension.   Gastroesophageal Reflux She complains of belching and heartburn. This is a recurrent problem. The current episode started more than 1 year ago. The problem occurs occasionally. Risk factors include obesity. She has tried a PPI for the symptoms. The treatment provided moderate relief.      Review of Systems  Gastrointestinal:  Positive for heartburn.  All other systems reviewed and are negative.      Objective:   Physical Exam Vitals reviewed.  Constitutional:      General: She is not in acute distress.    Appearance: She is well-developed.  HENT:     Head: Normocephalic and atraumatic.     Right Ear: Tympanic membrane normal.     Left Ear: Tympanic membrane normal.  Eyes:     Pupils: Pupils are equal, round, and reactive to light.  Neck:     Thyroid: No thyromegaly.  Cardiovascular:     Rate and Rhythm: Normal rate and regular rhythm.     Heart sounds: Normal heart sounds. No murmur heard. Pulmonary:     Effort: Pulmonary effort is normal. No respiratory distress.     Breath sounds: Normal breath sounds. No wheezing.  Abdominal:     General: Bowel sounds are normal. There is no distension.     Palpations: Abdomen is soft.     Tenderness: There is no abdominal tenderness.  Musculoskeletal:        General: No tenderness. Normal range of motion.     Cervical back: Normal range of motion and neck supple.  Skin:    General: Skin is warm and dry.  Neurological:     Mental Status: She is alert and oriented to person, place, and time.      Cranial Nerves: No cranial nerve deficit.     Deep Tendon Reflexes: Reflexes are normal and symmetric.  Psychiatric:        Behavior: Behavior normal.        Thought Content: Thought content normal.        Judgment: Judgment normal.      BP 99/69   Pulse 83   Temp 97.7 F (36.5 C) (Temporal)   Ht 5\' 4"  (1.626 m)   Wt 126 lb 3.2 oz (57.2 kg)   BMI 21.66 kg/m      Assessment & Plan:  EARLINE TANKS comes in today with chief complaint of Follow-up   Diagnosis and orders addressed:  1. Hypotension, unspecified hypotension type - CBC with Differential/Platelet - Ambulatory referral to Cardiology  2. Blood in stool - CBC with Differential/Platelet  3. Smoker - CBC with Differential/Platelet  4. Gastroesophageal reflux disease, unspecified whether esophagitis present - CBC with Differential/Platelet   Labs pending Health Maintenance reviewed Diet and exercise encouraged  Follow up plan:  6 months    Evelina Dun, FNP

## 2022-12-13 NOTE — Patient Instructions (Signed)
Hypotension As the heart beats, it forces blood through the body. Hypotension, commonly called low blood pressure, is when the force of blood pumping through the arteries is too weak. Arteries are blood vessels that carry blood from the heart throughout the body. Depending on the cause and severity, hypotension may be harmless (benign) or may cause serious problems (be critical). When your blood pressure is too low, you may not get enough blood to your brain or to the rest of your organs. This can cause weakness, light-headedness, a rapid heartbeat, and fainting. What are the causes? This condition may be caused by: Blood loss. Loss of body fluids (dehydration). Heart problems. Hormone (endocrine) problems. Pregnancy. Severe infection. Lack of certain nutrients. Severe allergic reactions (anaphylaxis). Certain medicines, such as blood pressure medicine or medicines that make the body lose excess fluids (diuretics). Sometimes, hypotension may be caused by not taking medicine as directed, such as taking too much of a certain medicine. What increases the risk? The following factors may make you more likely to develop this condition: Age. Risk increases as you get older. Having a condition that affects the heart or the central nervous system. What are the signs or symptoms? Common symptoms of this condition include: Weakness. Light-headedness. Dizziness. Blurred vision. Tiredness (fatigue). Rapid heartbeat. Fainting, in severe cases. How is this diagnosed? This condition is diagnosed based on: Your medical history. Your symptoms. Your blood pressure measurement. Your health care provider will check your blood pressure when you are: Lying down. Sitting. Standing. A blood pressure reading is recorded as two numbers, such as "120 over 80" (or 120/80). The first ("top") number is called the systolic pressure. It is a measure of the pressure in your arteries as your heart beats. The second  ("bottom") number is called the diastolic pressure. It is a measure of the pressure in your arteries when your heart relaxes between beats. Blood pressure is measured in a unit called mm Hg. Healthy blood pressure for most adults is 120/80. If your blood pressure is below 90/60, you may be diagnosed with hypotension. Other information or tests that may be used to diagnose hypotension include: Your other vital signs, such as your heart rate and temperature. Blood tests. Tilt table test. For this test, you will be safely secured to a table that moves you from a lying position to an upright position. Your heart rhythm and blood pressure will be monitored during the test. How is this treated? Treatment for this condition may include: Changing your diet. This may involve drinking more water or increasing your salt (sodium) intake with high-sodium foods. Taking medicines to raise your blood pressure. Changing the dosage of certain medicines you are taking that might be lowering your blood pressure. Wearing compression stockings. These stockings help to prevent blood clots and reduce swelling in your legs. In some cases, you may need to go to the hospital for: Fluid replacement. This means you will receive fluids through an IV. Blood replacement. This means you will receive donated blood through an IV (transfusion). Treating an infection or heart problems, if this applies. Monitoring. You may need to be monitored while medicines that you are taking wear off. Follow these instructions at home: Eating and drinking  Drink enough fluid to keep your urine pale yellow. Eat a healthy diet, and follow instructions from your health care provider about eating or drinking restrictions. A healthy diet includes: Fresh fruits and vegetables. Whole grains. Lean meats. Low-fat dairy products. Increase your salt intake if told   to do so. Do not add extra salt to your diet unless your health care provider tells you  to do that. Eat frequent, small meals. Avoid standing up suddenly after eating. Medicines Take over-the-counter and prescription medicines only as told by your health care provider. Follow instructions from your health care provider about changing the dosage of your current medicines, if this applies. Do not stop or adjust any of your medicines on your own. General instructions  Wear compression stockings as told by your health care provider. Get up slowly from lying down or sitting positions. This gives your blood pressure a chance to adjust. Avoid hot showers and excessive heat as directed by your health care provider. Return to your normal activities as told by your health care provider. Ask your health care provider what activities are safe for you. Do not use any products that contain nicotine or tobacco. These products include cigarettes, chewing tobacco, and vaping devices, such as e-cigarettes. If you need help quitting, ask your health care provider. Keep all follow-up visits. This is important. Contact a health care provider if: You vomit. You have diarrhea. You have a fever for more than 2-3 days. You feel more thirsty than usual. You feel weak and tired. Get help right away if: You have chest pain. You have a fast or irregular heartbeat. You develop numbness in any part of your body. You cannot move your arms or your legs. You have trouble speaking. You become sweaty or feel light-headed. You faint. You feel short of breath. You have trouble staying awake. You feel confused. These symptoms may be an emergency. Get help right away. Call 911. Do not wait to see if the symptoms will go away. Do not drive yourself to the hospital. Summary Hypotension is when the force of blood pumping through the arteries is too weak. Hypotension may be harmless (benign) or may cause serious problems (be critical). Treatment for this condition may include changing your diet, changing  your medicines, and wearing compression stockings. In some cases, you may need to go to the hospital for fluid or blood replacement. This information is not intended to replace advice given to you by your health care provider. Make sure you discuss any questions you have with your health care provider. Document Revised: 04/27/2021 Document Reviewed: 04/27/2021 Elsevier Patient Education  2023 Elsevier Inc.  

## 2022-12-14 ENCOUNTER — Other Ambulatory Visit: Payer: Self-pay | Admitting: Family

## 2022-12-14 LAB — CBC WITH DIFFERENTIAL/PLATELET
Basophils Absolute: 0.1 10*3/uL (ref 0.0–0.2)
Basos: 1 %
EOS (ABSOLUTE): 0.9 10*3/uL — ABNORMAL HIGH (ref 0.0–0.4)
Eos: 14 %
Hematocrit: 36 % (ref 34.0–46.6)
Hemoglobin: 11.6 g/dL (ref 11.1–15.9)
Immature Grans (Abs): 0 10*3/uL (ref 0.0–0.1)
Immature Granulocytes: 0 %
Lymphocytes Absolute: 2.5 10*3/uL (ref 0.7–3.1)
Lymphs: 39 %
MCH: 29.7 pg (ref 26.6–33.0)
MCHC: 32.2 g/dL (ref 31.5–35.7)
MCV: 92 fL (ref 79–97)
Monocytes Absolute: 0.5 10*3/uL (ref 0.1–0.9)
Monocytes: 8 %
Neutrophils Absolute: 2.4 10*3/uL (ref 1.4–7.0)
Neutrophils: 38 %
Platelets: 258 10*3/uL (ref 150–450)
RBC: 3.91 x10E6/uL (ref 3.77–5.28)
RDW: 12.5 % (ref 11.7–15.4)
WBC: 6.5 10*3/uL (ref 3.4–10.8)

## 2022-12-14 MED ORDER — METHOCARBAMOL 500 MG PO TABS: 500.0000 mg | ORAL_TABLET | Freq: Three times a day (TID) | ORAL | 1 refills | Status: DC | PRN

## 2022-12-14 NOTE — Progress Notes (Signed)
Fax from [place]: [place] prefers Cyclobenzaprine or Tizanidine Please advise and send in alternative  Please let patient know this as this is what she requested.   Evelina Dun, FNP

## 2022-12-15 ENCOUNTER — Telehealth: Payer: Self-pay | Admitting: Family Medicine

## 2022-12-15 NOTE — Telephone Encounter (Signed)
Outcome Approved today Your request has been approved Authorization Expiration Date: 09/20/2023 Drug Methocarbamol 500MG  tablets  Form [employer] Electronic PA Form [phone] NCPDP) Original Claim Info [id number] ([id number]* Preferred products are: [id number] - TIZANIDINE TAB 2MG , [id number] - CYCLOBENZAPR TAB 5MG , [id number] - CYCLOBENZAPR TAB 5MG    [facility] notified

## 2022-12-16 ENCOUNTER — Ambulatory Visit: Payer: Medicare HMO | Attending: Internal Medicine | Admitting: Internal Medicine

## 2022-12-16 ENCOUNTER — Encounter: Payer: Self-pay | Admitting: Internal Medicine

## 2022-12-16 VITALS — BP 98/68 | HR 89 | Ht 64.0 in | Wt 128.2 lb

## 2022-12-16 DIAGNOSIS — I959 Hypotension, unspecified: Secondary | ICD-10-CM | POA: Insufficient documentation

## 2022-12-16 DIAGNOSIS — Z136 Encounter for screening for cardiovascular disorders: Secondary | ICD-10-CM | POA: Diagnosis not present

## 2022-12-16 DIAGNOSIS — I950 Idiopathic hypotension: Secondary | ICD-10-CM | POA: Diagnosis not present

## 2022-12-16 MED ORDER — MIDODRINE HCL 2.5 MG PO TABS: 2.5000 mg | ORAL_TABLET | Freq: Three times a day (TID) | ORAL | 1 refills | Status: DC

## 2022-12-16 NOTE — Progress Notes (Signed)
Cardiology Office Note  Date: 12/16/2022   ID: Ruth, Hopkins 1966/01/20, MRN [id number]  PCP:  Sharion Balloon, FNP  Cardiologist:  None Electrophysiologist:  None   Reason for Office Visit: Evaluation of hypotension at the request of [facility]   History of Present Illness: Ruth Hopkins is a 57 y.o. female known to have fibromyalgia, depression, chronic fatigue, IBS, migraines was referred to cardiology clinic for evaluation of hypertension.  Patient was recently admitted to the hospital in 09/2022 and was treated for enterocolitis.  Since discharge from the hospital, her blood pressures have always stayed low around 80-90 mm Hg SBP. This has caused severe fatigue not able to do her ADLs. Her hemoglobin was also noted to be low which improved after iron supplementation. She was scheduled for EGD/colonoscopy soon with GI.  She says she had to be placed on medications to increase her blood pressure while in the hospital.  Otherwise she denies having any chest pain, DOE, palpitations, leg swelling.  She has mild elevation in hair fall for which she wanted to take finasteride 2.5 mg twice a day but it has a side effect of low blood pressure.  Past Medical History:  Diagnosis Date   Chronic fatigue    Depression    Fibromyalgia    IBS (irritable bowel syndrome)    Migraine    Thyroid disease     Past Surgical History:  Procedure Laterality Date   BREAST SURGERY Right 2007   lumpectomy   FOOT SURGERY      Current Outpatient Medications  Medication Sig Dispense Refill   acetaminophen (TYLENOL) 500 MG tablet Take by mouth.     clonazePAM (KLONOPIN) 0.5 MG tablet Take 1 tablet (0.5 mg total) by mouth 2 (two) times daily. 30 tablet 2   cycloSPORINE (RESTASIS) 0.05 % ophthalmic emulsion Restasis 0.05 % eye drops in a dropperette  USE 1 DROP INTO BOTH EYES TWICE A DAY     eletriptan (RELPAX) 40 MG tablet TAKE 1 TABLET BY MOUTH EVERY 2 HOURS AS NEEDED FOR MIGRAINE, MAX 6 TABLETS PER  DAY. 12 tablet 2   estradiol (VIVELLE-DOT) 0.075 MG/24HR Place 1 patch onto the skin 2 (two) times a week.     Fluocinolone Acetonide (FLAC) 0.01 % OIL Place 1 drop in ear(s) daily. Place 1-2 drop in right ear 20 mL 0   hydrocortisone (ANUSOL-HC) 25 MG suppository Place 1 suppository (25 mg total) rectally 2 (two) times daily. 12 suppository 0   imipramine (TOFRANIL) 10 MG tablet Take 3 tablets (30 mg total) by mouth at bedtime. 90 tablet 11   levothyroxine (SYNTHROID) 50 MCG tablet TAKE 1 TABLET BY MOUTH ONCE DAILY BEFORE BREAKFAST 90 tablet 1   meclizine (ANTIVERT) 50 MG tablet Take 1 tablet (50 mg total) by mouth 3 (three) times daily as needed. 30 tablet 0   methocarbamol (ROBAXIN) 500 MG tablet Take 1 tablet (500 mg total) by mouth every 8 (eight) hours as needed for muscle spasms. 90 tablet 1   mometasone (NASONEX) 50 MCG/ACT nasal spray PLACE 2 SPRAYS INTO THE NOSE DAILY. 51 each 1   Multiple Vitamins-Minerals (MULTIVITAMIN PO) Take by mouth.     omeprazole (PRILOSEC) 20 MG capsule Take 1 capsule by mouth once daily 90 capsule 1   ondansetron (ZOFRAN) 4 MG tablet Take 1 tablet (4 mg total) by mouth every 8 (eight) hours as needed for nausea or vomiting. 20 tablet 0   pentosan polysulfate (ELMIRON) 100 MG  capsule Take 1 capsule (100 mg total) by mouth 3 (three) times daily. 90 capsule 1   tacrolimus (PROTOPIC) 0.1 % ointment tacrolimus 0.1 % topical ointment  APPLY OINTMENT TOPICALLY TO AFFECTED AREA TWICE DAILY     topiramate (TOPAMAX) 25 MG tablet Take 1 tablet (25 mg total) by mouth 2 (two) times daily. 60 tablet 1   valACYclovir (VALTREX) 500 MG tablet Take 500 mg by mouth daily as needed.     No current facility-administered medications for this visit.   Allergies:  Latex, Codeine, Gabapentin, Sulfur, and Sulfa antibiotics   Social History: The patient  reports that she quit smoking about 6 weeks ago. Her smoking use included cigarettes. She has a 35.00 pack-year smoking history. She  has never used smokeless tobacco. She reports that she does not drink alcohol and does not use drugs.   Family History: The patient's family history includes Asthma in her mother; Hypertension in her father and mother; Hypothyroidism in her mother; Raynaud syndrome in her mother.   ROS:  Please see the history of present illness. Otherwise, complete review of systems is positive for none.  All other systems are reviewed and negative.   Physical Exam: VS:  BP 98/68   Pulse 89   Ht 5\' 4"  (1.626 m)   Wt 128 lb 3.2 oz (58.2 kg)   SpO2 100%   BMI 22.01 kg/m , BMI Body mass index is 22.01 kg/m.  Wt Readings from Last 3 Encounters:  12/16/22 128 lb 3.2 oz (58.2 kg)  12/13/22 126 lb 3.2 oz (57.2 kg)  11/11/22 121 lb 12.8 oz (55.2 kg)    General: Patient appears comfortable at rest. HEENT: Conjunctiva and lids normal, oropharynx clear with moist mucosa. Neck: Supple, no elevated JVP or carotid bruits, no thyromegaly. Lungs: Clear to auscultation, nonlabored breathing at rest. Cardiac: Regular rate and rhythm, no S3 or significant systolic murmur, no pericardial rub. Abdomen: Soft, nontender, no hepatomegaly, bowel sounds present, no guarding or rebound. Extremities: No pitting edema, distal pulses 2+. Skin: Warm and dry. Musculoskeletal: No kyphosis. Neuropsychiatric: Alert and oriented x3, affect grossly appropriate.  ECG:  An ECG dated 12/16/2022 was personally reviewed today and demonstrated:  Normal sinus rhythm  Recent Labwork: 07/27/2022: TSH 1.370 11/05/2022: ALT 45; AST 29; BUN 15; Creatinine, Ser 0.70; Potassium 4.0; Sodium 138 12/13/2022: Hemoglobin 11.6; Platelets 258     Component Value Date/Time   CHOL 217 (H) 07/27/2022 1244   TRIG 94 07/27/2022 1244   HDL 59 07/27/2022 1244   CHOLHDL 3.7 07/27/2022 1244   LDLCALC 141 (H) 07/27/2022 1244     Assessment and Plan: Patient is a 57 year old F known to have fibromyalgia, depression, IBS was referred to cardiology clinic  for evaluation of hypertension.  # Symptomatic hypotension -Patient's blood pressure stays around 80-90 mm Hg SBP which has caused severe fatigue/weakness and she is not able to do her ADLs. TSH within normal limits.  Instructed patient to increase p.o. water intake, 2L / day (total p.o. fluid intake to 3L / day) and liberal salt intake, 3g/day. If her blood pressure does not increase with the above measures, start taking midodrine 2.5 mg three times daily (30-day supply only). Patient can follow-up with PCP or return back to cardiology clinic for management of symptomatic hypotension.  # HLD: Diet and exercise.  Can start statin after BP increases to normal range.  I have spent a total of 30 minutes with patient reviewing chart, EKGs, labs and examining patient as  well as establishing an assessment and plan that was discussed with the patient.  > 50% of time was spent in direct patient care.     Medication Adjustments/Labs and Tests Ordered: Current medicines are reviewed at length with the patient today.  Concerns regarding medicines are outlined above.   Tests Ordered: Orders Placed This Encounter  Procedures   EKG 12-Lead    Medication Changes: Meds ordered this encounter  Medications   DISCONTD: midodrine (PROAMATINE) 2.5 MG tablet    Sig: Take 1 tablet (2.5 mg total) by mouth 3 (three) times daily with meals.    Dispense:  30 tablet    Refill:  1    12/16/22 [facility]   midodrine (PROAMATINE) 2.5 MG tablet    Sig: Take 1 tablet (2.5 mg total) by mouth 3 (three) times daily with meals.    Dispense:  90 tablet    Refill:  1    12/16/22 [facility] -PLEASE CANCEL PREVIOUS RX-INCORRECT QUANTITY      Disposition:  Follow up prn  Signed Madaline Lefeber Fidel Levy, MD, 12/16/2022 3:20 PM    [facility], [place], [place] [address]

## 2022-12-16 NOTE — Patient Instructions (Signed)
Medication Instructions:  Your physician has recommended you make the following change in your medication:  START Midodrine 2.5 mg three times a day Continue all other medications as directed  Labwork: none  Testing/Procedures: none  Follow-Up:  Your physician recommends that you schedule a follow-up appointment in: Follow up as needed  Any Other Special Instructions Will Be Listed Below (If Applicable).  Need to consume no more than 3 grams of Salt, 3 liters of fluid and 7 liters of water.  If you need a refill on your cardiac medications before your next appointment, please call your pharmacy.

## 2022-12-21 ENCOUNTER — Telehealth: Payer: Self-pay | Admitting: Family

## 2022-12-21 DIAGNOSIS — K219 Gastro-esophageal reflux disease without esophagitis: Secondary | ICD-10-CM

## 2022-12-21 MED ORDER — OMEPRAZOLE 20 MG PO CPDR: 20.0000 mg | DELAYED_RELEASE_CAPSULE | Freq: Every day | ORAL | 1 refills | Status: DC

## 2022-12-21 NOTE — Telephone Encounter (Signed)
  Prescription Request  12/21/2022  Is this a "Controlled Substance" medicine? omeprazole (PRILOSEC) 20 MG capsule   Have you seen your PCP in the last 2 weeks? 12/13/2022  If YES, route message to pool  -  If NO, patient needs to be scheduled for appointment.  What is the name of the medication or equipment? omeprazole (PRILOSEC) 20 MG capsule   Have you contacted your pharmacy to request a refill? yes   Which pharmacy would you like this sent to? [facility]   Patient notified that their request is being sent to the clinical staff for review and that they should receive a response within 2 business days.

## 2022-12-21 NOTE — Telephone Encounter (Signed)
LMOVM refill sent to pharmacy 

## 2022-12-27 ENCOUNTER — Telehealth: Payer: Self-pay | Admitting: Internal Medicine

## 2022-12-27 NOTE — Telephone Encounter (Signed)
Pt c/o medication issue:  1. Name of Medication:   midodrine (PROAMATINE) 2.5 MG tablet    2. How are you currently taking this medication (dosage and times per day)?   3. Are you having a reaction (difficulty breathing--STAT)?   4. What is your medication issue? Patient states that she would like clarification as to how Dr. Jenene Slicker would like for her to take this medication. Please advise.

## 2022-12-27 NOTE — Telephone Encounter (Signed)
Patient informed that per Dr. Antoine Poche notes, she is to take the midodrine 3 times a day with meals. Patient states that she does not have a good sleep schedule and that sometimes, she may sleep later. Informed the patient that she could set an alarm. Wants to know if she takes the medication, will it be safe to lay down directly after taking? Please advise

## 2022-12-30 DIAGNOSIS — K529 Noninfective gastroenteritis and colitis, unspecified: Secondary | ICD-10-CM | POA: Diagnosis not present

## 2022-12-30 DIAGNOSIS — K921 Melena: Secondary | ICD-10-CM | POA: Diagnosis not present

## 2022-12-30 DIAGNOSIS — K30 Functional dyspepsia: Secondary | ICD-10-CM | POA: Diagnosis not present

## 2022-12-30 DIAGNOSIS — K3189 Other diseases of stomach and duodenum: Secondary | ICD-10-CM | POA: Diagnosis not present

## 2022-12-30 DIAGNOSIS — K648 Other hemorrhoids: Secondary | ICD-10-CM | POA: Diagnosis not present

## 2023-01-04 ENCOUNTER — Ambulatory Visit (INDEPENDENT_AMBULATORY_CARE_PROVIDER_SITE_OTHER): Payer: Medicare HMO | Admitting: Family

## 2023-01-04 ENCOUNTER — Encounter: Payer: Self-pay | Admitting: Family

## 2023-01-04 VITALS — BP 101/70 | HR 88 | Temp 97.3°F | Ht 64.0 in | Wt 124.6 lb

## 2023-01-04 DIAGNOSIS — M545 Low back pain, unspecified: Secondary | ICD-10-CM

## 2023-01-04 DIAGNOSIS — I950 Idiopathic hypotension: Secondary | ICD-10-CM | POA: Diagnosis not present

## 2023-01-04 DIAGNOSIS — F419 Anxiety disorder, unspecified: Secondary | ICD-10-CM | POA: Diagnosis not present

## 2023-01-04 DIAGNOSIS — F17200 Nicotine dependence, unspecified, uncomplicated: Secondary | ICD-10-CM

## 2023-01-04 DIAGNOSIS — R5383 Other fatigue: Secondary | ICD-10-CM

## 2023-01-04 DIAGNOSIS — K219 Gastro-esophageal reflux disease without esophagitis: Secondary | ICD-10-CM | POA: Diagnosis not present

## 2023-01-04 DIAGNOSIS — E785 Hyperlipidemia, unspecified: Secondary | ICD-10-CM | POA: Diagnosis not present

## 2023-01-04 DIAGNOSIS — M797 Fibromyalgia: Secondary | ICD-10-CM

## 2023-01-04 DIAGNOSIS — G43901 Migraine, unspecified, not intractable, with status migrainosus: Secondary | ICD-10-CM

## 2023-01-04 DIAGNOSIS — M79604 Pain in right leg: Secondary | ICD-10-CM

## 2023-01-04 DIAGNOSIS — E039 Hypothyroidism, unspecified: Secondary | ICD-10-CM | POA: Diagnosis not present

## 2023-01-04 DIAGNOSIS — R6889 Other general symptoms and signs: Secondary | ICD-10-CM | POA: Diagnosis not present

## 2023-01-04 DIAGNOSIS — R69 Illness, unspecified: Secondary | ICD-10-CM | POA: Diagnosis not present

## 2023-01-04 NOTE — Progress Notes (Signed)
Subjective:    Patient ID: Ruth Hopkins, female    DOB: Apr 14, 1966, 57 y.o.   MRN: [id number]  Chief Complaint  Patient presents with   Follow-up   PT presents to the office today for chronic follow up.   She is followed by psychiatrist 3 months for hot flashes and GAD.She is currently taking estradiol patch and gradually decreasing and trying to add other medications. She has tried gabapentin, Lexapro, and Effexor, and wellbutrin without success.   She is complaining of hair loss.    She has fibromyalgia and states she gets neck pain that causes her migraines. She is followed by GYN annually  She is followed by GI for blood in stool. She had colonoscopy and EGD 12/30/22. Pt states she was told she might have Crohns disease.   She has hypotension and saw Cardiologists. She was started on Midodrine, but has not started this.  Gastroesophageal Reflux She complains of belching and heartburn. This is a chronic problem. The current episode started more than 1 year ago. The problem occurs occasionally. Associated symptoms include fatigue. She has tried a PPI for the symptoms. The treatment provided moderate relief.  Back Pain This is a chronic problem. The current episode started more than 1 year ago. The problem occurs intermittently. The problem has been waxing and waning since onset. The pain is present in the lumbar spine and gluteal. The quality of the pain is described as aching. The pain is at a severity of 10/10 (when she wakes up in AM). The pain is moderate. The symptoms are aggravated by twisting. She has tried home exercises for the symptoms. The treatment provided mild relief.  Nicotine Dependence Presents for follow-up visit. Symptoms include fatigue. Her urge triggers include company of smokers. The symptoms have been stable. She smokes < 1/2 a pack of cigarettes per day.  Migraine  This is a chronic problem. The current episode started more than 1 year ago. Episode frequency: 12 a  month. The pain is located in the Left unilateral region. The pain quality is similar to prior headaches. Associated symptoms include back pain.  Thyroid Problem Presents for follow-up visit. Symptoms include anxiety, fatigue and hair loss. The symptoms have been stable.      Review of Systems  Constitutional:  Positive for fatigue.  Gastrointestinal:  Positive for heartburn.  Musculoskeletal:  Positive for back pain.  Psychiatric/Behavioral:  The patient is nervous/anxious.   All other systems reviewed and are negative.      Objective:   Physical Exam Vitals reviewed.  Constitutional:      General: She is not in acute distress.    Appearance: She is well-developed.  HENT:     Head: Normocephalic and atraumatic.     Right Ear: Tympanic membrane normal.     Left Ear: Tympanic membrane normal.  Eyes:     Pupils: Pupils are equal, round, and reactive to light.  Neck:     Thyroid: No thyromegaly.  Cardiovascular:     Rate and Rhythm: Normal rate and regular rhythm.     Heart sounds: Normal heart sounds. No murmur heard. Pulmonary:     Effort: Pulmonary effort is normal. No respiratory distress.     Breath sounds: Normal breath sounds. No wheezing.  Abdominal:     General: Bowel sounds are normal. There is no distension.     Palpations: Abdomen is soft.     Tenderness: There is no abdominal tenderness.  Musculoskeletal:  General: No tenderness. Normal range of motion.     Cervical back: Normal range of motion and neck supple.  Skin:    General: Skin is warm and dry.  Neurological:     Mental Status: She is alert and oriented to person, place, and time.     Cranial Nerves: No cranial nerve deficit.     Deep Tendon Reflexes: Reflexes are normal and symmetric.  Psychiatric:        Mood and Affect: Mood is anxious.        Behavior: Behavior normal.        Thought Content: Thought content normal.        Judgment: Judgment normal.       BP 101/70   Pulse 88    Temp (!) 97.3 F (36.3 C) (Temporal)   Ht  (1.626 m)   Wt 124 lb 9.6 oz (56.5 kg)   SpO2 98%   BMI 21.39 kg/m      Assessment & Plan:  Ruth Hopkins comes in today with chief complaint of Follow-up   Diagnosis and orders addressed:  1. Acquired hypothyroidism - CBC with Differential/Platelet - BMP8+EGFR  2. Anxiety - CBC with Differential/Platelet - BMP8+EGFR  3. Other fatigue - CBC with Differential/Platelet - Iron, TIBC and Ferritin Panel - BMP8+EGFR  4. Fibromyalgia - CBC with Differential/Platelet - BMP8+EGFR  5. Gastroesophageal reflux disease, unspecified whether esophagitis present - CBC with Differential/Platelet - BMP8+EGFR  6. Hyperlipidemia, unspecified hyperlipidemia type - CBC with Differential/Platelet - BMP8+EGFR  7. Idiopathic hypotension  - CBC with Differential/Platelet - Iron, TIBC and Ferritin Panel - BMP8+EGFR  8. Lumbar pain with radiation down right leg - CBC with Differential/Platelet - BMP8+EGFR  9. Migraine with status migrainosus, not intractable, unspecified migraine type - CBC with Differential/Platelet - BMP8+EGFR  10. Smoker  - CBC with Differential/Platelet - BMP8+EGFR   Labs pending Health Maintenance reviewed Diet and exercise encouraged  Follow up plan: 6 months   Ruth Rodney, FNP

## 2023-01-04 NOTE — Patient Instructions (Signed)
Crohn's Disease  Crohn's disease is a long-lasting (chronic) disease that affects the gastrointestinal (GI) tract. Crohn's disease often causes irritation and inflammation in the small intestine and the beginning of the large intestine, but it can affect any part of the GI tract. Crohn's disease is part of a group of illnesses that are known as inflammatory bowel disease (IBD). Crohn's disease may start slowly and get worse over time. Symptoms may come and go. They may also go away for months or even years at a time (remission). What are the causes? The exact cause of this condition is not known. It may involve a response that causes your body's disease-fighting system (immune system) to attack healthy cells and tissues (autoimmune response). Bacteria, genes, and your environment may also play a role. What increases the risk? The following factors may make you more likely to develop this condition: Having a family member who has Crohn's disease, another IBD, or an autoimmune condition. Using products that contain nicotine or tobacco, such as cigarettes and e-cigarettes. Being in your 20s. Having Eastern European ancestry. What are the signs or symptoms? The main symptoms of this condition involve your GI tract. These include: Diarrhea. Pain or cramping in the abdomen commonly felt in the lower right side of the abdomen. Frequent watery or bloody stools. Constipation. This may mean having: Fewer bowel movements in a week than normal. Difficulty having a bowel movement. Stools that are dry, hard, or larger than normal. Rectal bleeding or pain. An urgent need to have a bowel movement. The feeling that you are not finished having a bowel movement. Other symptoms may include: Unexplained weight loss. Tiredness (fatigue). Fever. Nausea or appetite loss. Joint pain. Vision changes. Red bumps or sores on the skin. Sores inside the mouth. How is this diagnosed? This condition may be  diagnosed based on: Your symptoms and medical history. A physical exam. Tests, which may include: Blood tests. Stool sample tests. Imaging tests, such as X-rays and CT scans. Tests to examine the inside of your intestines using a long, flexible tube that has a light and a camera on the end (colonoscopy). A procedure to remove tissue samples from inside your bowel for testing (biopsy). You may need to work with a health care provider who specializes in diseases of the digestive tract (gastroenterologist). How is this treated? There is no cure for this condition, and it affects each person differently. Treatment can help you manage your symptoms. Your treatment may include: Medicines. These may be used by themselves or with other treatments (combination therapy). You may be given medicines that help to: Reduce inflammation. Control your immune system activity. Fight infections. Relieve cramps and prevent diarrhea. Control your pain. Surgery. You may need surgery if: Medicines and other treatments are not working anymore. You develop complications from severe Crohn's disease. A section of your intestine becomes so damaged that it needs to be removed. Lifestyle changes: Maintaining eating or drinking restrictions. Reducing or eliminating use of alcohol or nicotine. Follow these instructions at home: Medicines Take over-the-counter and prescription medicines only as told by your health care provider. If you were prescribed an antibiotic, take it as told by your health care provider. Do not stop taking the antibiotic even if you start to feel better. Avoid taking ibuprofen or other NSAID medicines if possible. These can make Crohn's disease worse. Eating and drinking Talk with your health care provider or a registered dietitian about what diet is best for you. Drink enough fluid to keep your   urine pale yellow. If you are taking steroids to reduce inflammation, get plenty of calcium in your  diet to help keep your bones healthy. You may also consider taking a calcium supplement with vitamin D. Keep a food diary to identify foods that make your symptoms better or worse, and avoid foods that cause symptoms. Follow instructions from your health care provider about eating or drinking restrictions if you have worsening symptoms (flare-up). If you drink alcohol: Limit how much you have to: 0-1 drink a day for women who are not pregnant. 0-2 drinks a day for men. Know how much alcohol is in a drink. In the [place] one drink equals one 12 oz bottle of beer (355 mL), one 5 oz glass of wine (148 mL), or one 1 oz glass of hard liquor (44 mL). General instructions Make sure you get all the vaccines that your health care provider recommends, especially pneumonia (pneumococcal) and flu (influenza) vaccines. Do not use any products that contain nicotine or tobacco. These products include cigarettes, chewing tobacco, and vaping devices, such as e-cigarettes. If you need help quitting, ask your health care provider. Exercise every day, or as often as told by your health care provider. Keep all follow-up visits. This is important. Contact a health care provider if: You have diarrhea, cramps in your abdomen, and other GI problems that are present almost all the time. Your symptoms do not improve with treatment, your symptoms get worse, or you develop new symptoms. You cannot pass stools. You continue to lose weight. You develop a rash or sores on your skin. You develop eye problems. You have a fever. Get help right away if: You have bloody diarrhea. You have severe pain in your abdomen. These symptoms may be an emergency. Get help right away. Call 911. Do not wait to see if the symptoms will go away. Do not drive yourself to the hospital. Summary Crohn's disease affects each person differently. The cause of this condition is not known, but it may involve a response that causes your body's immune  system to attack healthy cells and tissues. There are multiple treatment options that can help you manage the condition. Talk with your health care provider or a registered dietitian about what diet is best for you. Make sure you get all the vaccines that your health care provider recommends, especially pneumonia (pneumococcal) and flu (influenza) vaccines. Get help right away if you have bloody diarrhea or severe pain in your abdomen. This information is not intended to replace advice given to you by your health care provider. Make sure you discuss any questions you have with your health care provider. Document Revised: 03/12/2021 Document Reviewed: 03/12/2021 Elsevier Patient Education  2023 Else[employer] Inc.  

## 2023-01-05 LAB — BMP8+EGFR
BUN/Creatinine Ratio: 38 — ABNORMAL HIGH (ref 9–23)
BUN: 24 mg/dL (ref 6–24)
CO2: 21 mmol/L (ref 20–29)
Calcium: 9.2 mg/dL (ref 8.7–10.2)
Chloride: 102 mmol/L (ref 96–106)
Creatinine, Ser: 0.63 mg/dL (ref 0.57–1.00)
Glucose: 81 mg/dL (ref 70–99)
Potassium: 3.9 mmol/L (ref 3.5–5.2)
Sodium: 139 mmol/L (ref 134–144)
eGFR: 103 mL/min/{1.73_m2} (ref >59)

## 2023-01-05 LAB — CBC WITH DIFFERENTIAL/PLATELET
Basophils Absolute: 0.1 10*3/uL (ref 0.0–0.2)
Basos: 1 %
EOS (ABSOLUTE): 0.7 10*3/uL — ABNORMAL HIGH (ref 0.0–0.4)
Eos: 9 %
Hematocrit: 39.9 % (ref 34.0–46.6)
Hemoglobin: 12.6 g/dL (ref 11.1–15.9)
Immature Grans (Abs): 0 10*3/uL (ref 0.0–0.1)
Immature Granulocytes: 0 %
Lymphocytes Absolute: 2 10*3/uL (ref 0.7–3.1)
Lymphs: 28 %
MCH: 29.2 pg (ref 26.6–33.0)
MCHC: 31.6 g/dL (ref 31.5–35.7)
MCV: 93 fL (ref 79–97)
Monocytes Absolute: 0.5 10*3/uL (ref 0.1–0.9)
Monocytes: 7 %
Neutrophils Absolute: 4 10*3/uL (ref 1.4–7.0)
Neutrophils: 55 %
Platelets: 256 10*3/uL (ref 150–450)
RBC: 4.31 x10E6/uL (ref 3.77–5.28)
RDW: 11.6 % — ABNORMAL LOW (ref 11.7–15.4)
WBC: 7.3 10*3/uL (ref 3.4–10.8)

## 2023-01-05 LAB — IRON,TIBC AND FERRITIN PANEL
Ferritin: 46 ng/mL (ref 15–150)
Iron Saturation: 26 % (ref 15–55)
Iron: 72 ug/dL (ref 27–159)
Total Iron Binding Capacity: 279 ug/dL (ref 250–450)
UIBC: 207 ug/dL (ref 131–425)

## 2023-01-06 DIAGNOSIS — K529 Noninfective gastroenteritis and colitis, unspecified: Secondary | ICD-10-CM | POA: Diagnosis not present

## 2023-01-12 DIAGNOSIS — H5203 Hypermetropia, bilateral: Secondary | ICD-10-CM | POA: Diagnosis not present

## 2023-01-12 DIAGNOSIS — H524 Presbyopia: Secondary | ICD-10-CM | POA: Diagnosis not present

## 2023-01-12 DIAGNOSIS — H52223 Regular astigmatism, bilateral: Secondary | ICD-10-CM | POA: Diagnosis not present

## 2023-01-12 DIAGNOSIS — H2513 Age-related nuclear cataract, bilateral: Secondary | ICD-10-CM | POA: Diagnosis not present

## 2023-01-20 ENCOUNTER — Ambulatory Visit (INDEPENDENT_AMBULATORY_CARE_PROVIDER_SITE_OTHER): Payer: Medicare HMO | Admitting: Family

## 2023-01-20 ENCOUNTER — Encounter: Payer: Self-pay | Admitting: Family

## 2023-01-20 VITALS — BP 112/75 | HR 88 | Temp 97.9°F | Ht 64.0 in | Wt 125.8 lb

## 2023-01-20 DIAGNOSIS — F419 Anxiety disorder, unspecified: Secondary | ICD-10-CM | POA: Diagnosis not present

## 2023-01-20 DIAGNOSIS — E039 Hypothyroidism, unspecified: Secondary | ICD-10-CM

## 2023-01-20 DIAGNOSIS — D649 Anemia, unspecified: Secondary | ICD-10-CM | POA: Diagnosis not present

## 2023-01-20 DIAGNOSIS — L659 Nonscarring hair loss, unspecified: Secondary | ICD-10-CM | POA: Diagnosis not present

## 2023-01-20 NOTE — Patient Instructions (Signed)
Hair Loss Cover-Ups  There are a variety of products available over-the-counter to camouflage hair loss by covering the bare scalp and coating existing hairs. These products can be used in all types of hair loss, for both men and women. They are intended to stay in until they are washed out. Many of them are also swim-, sweat-, and rub-resistant.   They come in a variety of shades to match your hair color (some even can be used for salt-and-pepper). These products can be purchased at hair salons and over the internet. Here are some of the available choices: Keratin fibers: These bind to the existing hairs through static electricity. They come in a sprinkle can, although you also can buy a pump applicator. Brands include Toppik, XFusion, Megathik, and Nanogen. You can also buy sprays to make them stay better in windy conditions, and applicator combs to use when applying to the frontal hairline. Powder cakes: These are applied using a sponge stick applicator. You dampen the applicator, rub it on the hard-packed powder, and then apply to the scalp. You can shade it at the hairline. An example is DermMatch Topical Shading. Sprays: These are sprayed on from a close distance, and they are very useful for larger bald spots. Brands include Fullmore, ProThik, Spray On Hair, and GLH (Good Looking Hair).    Creams: These are daubed on with your fingers to darken the scalp. You can also use sponge applicators. An example is Couvr Masking Lotion.  

## 2023-01-20 NOTE — Progress Notes (Signed)
   Subjective:    Patient ID: Ruth Hopkins, female    DOB: [birth year], 57 y.o.   MRN: [id number]  Chief Complaint  Patient presents with   Follow-up    Hair falling out and iron rck    PT presents to the office today with complaints of hair loss. She is worried her iron has caused this. Her iron was checked on 01/04/23 and was normal.   She has hypothyroidism and her TSH was normal on 10/19/22.   Her hair loss is giving her a lot of anxiety.  Thyroid Problem Presents for follow-up visit. Symptoms include anxiety and fatigue. Patient reports no constipation, diarrhea or hoarse voice. The symptoms have been stable.      Review of Systems  Constitutional:  Positive for fatigue.  HENT:  Negative for hoarse voice.   Gastrointestinal:  Negative for constipation and diarrhea.  Psychiatric/Behavioral:  The patient is nervous/anxious.   All other systems reviewed and are negative.      Objective:   Physical Exam Vitals reviewed.  Constitutional:      General: She is not in acute distress.    Appearance: She is well-developed.  HENT:     Head: Normocephalic and atraumatic.     Right Ear: Tympanic membrane normal.     Left Ear: Tympanic membrane normal.  Eyes:     Pupils: Pupils are equal, round, and reactive to light.  Neck:     Thyroid: No thyromegaly.  Cardiovascular:     Rate and Rhythm: Normal rate and regular rhythm.     Heart sounds: Normal heart sounds. No murmur heard. Pulmonary:     Effort: Pulmonary effort is normal. No respiratory distress.     Breath sounds: Normal breath sounds. No wheezing.  Abdominal:     General: Bowel sounds are normal. There is no distension.     Palpations: Abdomen is soft.     Tenderness: There is no abdominal tenderness.  Musculoskeletal:        General: No tenderness. Normal range of motion.     Cervical back: Normal range of motion and neck supple.  Skin:    General: Skin is warm and dry.  Neurological:     Mental Status: She is  alert and oriented to person, place, and time.     Cranial Nerves: No cranial nerve deficit.     Deep Tendon Reflexes: Reflexes are normal and symmetric.  Psychiatric:        Mood and Affect: Mood is anxious.        Behavior: Behavior normal.        Thought Content: Thought content normal.        Judgment: Judgment normal.       BP 112/75   Pulse 88   Temp 97.9 F (36.6 C) (Temporal)   Ht 5\' 4"  (1.626 m)   Wt 125 lb 12.8 oz (57.1 kg)   SpO2 98%   BMI 21.59 kg/m      Assessment & Plan:  Ruth Hopkins comes in today with chief complaint of Follow-up (Hair falling out and iron rck )   Diagnosis and orders addressed:  1. Hair loss - Ambulatory referral to Dermatology  2. Anxiety  3. Acquired hypothyroidism  4. Anemia, unspecified type  Labs reviewed Continue medications  Start Proscar  Encourage healthy diet    Jannifer Rodney, [place]

## 2023-01-27 ENCOUNTER — Ambulatory Visit: Payer: Medicare HMO | Admitting: Family

## 2023-01-31 DIAGNOSIS — H52223 Regular astigmatism, bilateral: Secondary | ICD-10-CM | POA: Diagnosis not present

## 2023-01-31 DIAGNOSIS — H5203 Hypermetropia, bilateral: Secondary | ICD-10-CM | POA: Diagnosis not present

## 2023-02-09 DIAGNOSIS — K921 Melena: Secondary | ICD-10-CM | POA: Diagnosis not present

## 2023-02-09 DIAGNOSIS — R1010 Upper abdominal pain, unspecified: Secondary | ICD-10-CM | POA: Diagnosis not present

## 2023-02-09 DIAGNOSIS — K529 Noninfective gastroenteritis and colitis, unspecified: Secondary | ICD-10-CM | POA: Diagnosis not present

## 2023-02-10 ENCOUNTER — Encounter: Payer: Self-pay | Admitting: Family

## 2023-02-10 ENCOUNTER — Telehealth: Payer: Self-pay | Admitting: Family

## 2023-02-10 NOTE — Telephone Encounter (Signed)
Would you start this med?

## 2023-02-11 NOTE — Telephone Encounter (Signed)
Patient doesn't understand why we can not send in tried to explain states she will do referral if she has to she is in pain

## 2023-02-11 NOTE — Telephone Encounter (Signed)
I am sorry, but patient is taking Klonopin and I can not order Norco for her. I can place a referral to pain management if she wishes.   Jannifer Rodney, FNP

## 2023-02-12 ENCOUNTER — Other Ambulatory Visit: Payer: Self-pay | Admitting: Family

## 2023-02-12 DIAGNOSIS — E039 Hypothyroidism, unspecified: Secondary | ICD-10-CM

## 2023-02-24 ENCOUNTER — Encounter: Payer: Self-pay | Admitting: Family Medicine

## 2023-02-24 ENCOUNTER — Telehealth (INDEPENDENT_AMBULATORY_CARE_PROVIDER_SITE_OTHER): Payer: Medicare HMO | Admitting: Family Medicine

## 2023-02-24 DIAGNOSIS — L299 Pruritus, unspecified: Secondary | ICD-10-CM

## 2023-02-24 MED ORDER — FLUOCINOLONE ACETONIDE 0.01 % OT OIL: 1.0000 [drp] | TOPICAL_OIL | Freq: Every day | OTIC | 0 refills | Status: DC

## 2023-02-24 NOTE — Progress Notes (Signed)
   Virtual Visit via video Note   Due to COVID-19 pandemic this visit was conducted virtually. This visit type was conducted due to national recommendations for restrictions regarding the COVID-19 Pandemic (e.g. social distancing, sheltering in place) in an effort to limit this patient's exposure and mitigate transmission in our community. All issues noted in this document were discussed and addressed.  A physical exam was not performed with this format.  I connected with  Ruth Hopkins  on 02/24/23 at 08:09 by video and verified that I am speaking with the correct person using two identifiers. Ruth Hopkins is currently located at home and no one is currently with her during visit. The provider, Gabriel Earing, FNP is located in their office at time of visit.  I discussed the limitations, risks, security and privacy concerns of performing an evaluation and management service by video  and the availability of in person appointments. I also discussed with the patient that there may be a patient responsible charge related to this service. The patient expressed understanding and agreed to proceed.  CC: itchy ears  History and Present Illness:  Ruth Hopkins reports dry, itchy ear canals for 3 weeks. This has been worsening. She also reports a itchy throat that started yesterday. Sneezing for the last few days as well. Denies ear pain, drainage, erythema, or swelling. Denies fever, chills, HA, congestion, runny nose. She has been taking claritin and nasal spray without improvement. She has been prescribed a steroid ear oil in the past with good relief.    ROS As per HPI.     Observations/Objective: Alert and oriented. Respirations unlabored. No cyanosis. Non toxic appearing. Normal mood and behavior.    Assessment and Plan: Ruth Hopkins was seen today for pruritis.  Diagnoses and all orders for this visit:  Ear itching -     Fluocinolone Acetonide (FLAC) 0.01 % OIL; Place 1 drop in ear(s) daily. Place  1-2 drop in right ear     Follow Up Instructions: Return to office for new or worsening symptoms, or if symptoms persist.     I discussed the assessment and treatment plan with the patient. The patient was provided an opportunity to ask questions and all were answered. The patient agreed with the plan and demonstrated an understanding of the instructions.   The patient was advised to call back or seek an in-person evaluation if the symptoms worsen or if the condition fails to improve as anticipated.  The above assessment and management plan was discussed with the patient. The patient verbalized understanding of and has agreed to the management plan. Patient is aware to call the clinic if symptoms persist or worsen. Patient is aware when to return to the clinic for a follow-up visit. Patient educated on when it is appropriate to go to the emergency department.   Time call ended: 0815  I provided 6 minutes of face-to-face time during this encounter.    Gabriel Earing, FNP

## 2023-02-27 ENCOUNTER — Other Ambulatory Visit: Payer: Self-pay | Admitting: Family

## 2023-03-02 DIAGNOSIS — F411 Generalized anxiety disorder: Secondary | ICD-10-CM | POA: Diagnosis not present

## 2023-03-02 DIAGNOSIS — F330 Major depressive disorder, recurrent, mild: Secondary | ICD-10-CM | POA: Diagnosis not present

## 2023-03-07 DIAGNOSIS — K529 Noninfective gastroenteritis and colitis, unspecified: Secondary | ICD-10-CM | POA: Diagnosis not present

## 2023-03-07 DIAGNOSIS — K7689 Other specified diseases of liver: Secondary | ICD-10-CM | POA: Diagnosis not present

## 2023-03-07 DIAGNOSIS — K8020 Calculus of gallbladder without cholecystitis without obstruction: Secondary | ICD-10-CM | POA: Diagnosis not present

## 2023-03-09 DIAGNOSIS — L658 Other specified nonscarring hair loss: Secondary | ICD-10-CM | POA: Diagnosis not present

## 2023-03-09 DIAGNOSIS — L570 Actinic keratosis: Secondary | ICD-10-CM | POA: Diagnosis not present

## 2023-03-09 DIAGNOSIS — L650 Telogen effluvium: Secondary | ICD-10-CM | POA: Diagnosis not present

## 2023-03-22 DIAGNOSIS — R92333 Mammographic heterogeneous density, bilateral breasts: Secondary | ICD-10-CM | POA: Diagnosis not present

## 2023-03-22 DIAGNOSIS — Z1330 Encounter for screening examination for mental health and behavioral disorders, unspecified: Secondary | ICD-10-CM | POA: Diagnosis not present

## 2023-03-22 DIAGNOSIS — Z9189 Other specified personal risk factors, not elsewhere classified: Secondary | ICD-10-CM | POA: Diagnosis not present

## 2023-03-22 DIAGNOSIS — Z86000 Personal history of in-situ neoplasm of breast: Secondary | ICD-10-CM | POA: Diagnosis not present

## 2023-03-24 ENCOUNTER — Other Ambulatory Visit: Payer: Self-pay | Admitting: Family

## 2023-03-24 DIAGNOSIS — G43901 Migraine, unspecified, not intractable, with status migrainosus: Secondary | ICD-10-CM

## 2023-03-28 DIAGNOSIS — K529 Noninfective gastroenteritis and colitis, unspecified: Secondary | ICD-10-CM | POA: Diagnosis not present

## 2023-03-29 ENCOUNTER — Telehealth (INDEPENDENT_AMBULATORY_CARE_PROVIDER_SITE_OTHER): Payer: Medicare HMO | Admitting: Family Medicine

## 2023-03-29 ENCOUNTER — Encounter: Payer: Self-pay | Admitting: Family Medicine

## 2023-03-29 DIAGNOSIS — R300 Dysuria: Secondary | ICD-10-CM | POA: Diagnosis not present

## 2023-03-29 DIAGNOSIS — R3989 Other symptoms and signs involving the genitourinary system: Secondary | ICD-10-CM | POA: Diagnosis not present

## 2023-03-29 LAB — URINALYSIS, ROUTINE W REFLEX MICROSCOPIC
Bilirubin, UA: NEGATIVE
Glucose, UA: NEGATIVE
Ketones, UA: NEGATIVE
Leukocytes,UA: NEGATIVE
Nitrite, UA: NEGATIVE
Protein,UA: NEGATIVE
Specific Gravity, UA: 1.02 (ref 1.005–1.030)
Urobilinogen, Ur: 0.2 mg/dL (ref 0.2–1.0)
pH, UA: 5.5 (ref 5.0–7.5)

## 2023-03-29 LAB — MICROSCOPIC EXAMINATION
Renal Epithel, UA: NONE SEEN /hpf
WBC, UA: NONE SEEN /hpf (ref 0–5)

## 2023-03-29 MED ORDER — FLUCONAZOLE 150 MG PO TABS
150.0000 mg | ORAL_TABLET | Freq: Once | ORAL | 0 refills | Status: AC
Start: 2023-03-29 — End: 2023-03-29

## 2023-03-29 MED ORDER — CEPHALEXIN 500 MG PO CAPS
500.0000 mg | ORAL_CAPSULE | Freq: Three times a day (TID) | ORAL | 0 refills | Status: DC
Start: 2023-03-29 — End: 2023-04-01

## 2023-03-29 NOTE — Patient Instructions (Signed)
Interstitial Cystitis  Interstitial cystitis is inflammation of the bladder. This condition is also known as painful bladder syndrome. This may cause pain in the bladder area as well as a frequent and urgent need to urinate. The bladder is an organ that stores urine after the urine is made in the kidneys. The severity of interstitial cystitis can vary from person to person. You may have flare-ups, and then your symptoms may go away for a while. For many people, it becomes a long-term (chronic) problem. What are the causes? The cause of this condition is not known. What increases the risk? The following factors may make you more likely to develop this condition: Being female. Having fibromyalgia. Having irritable bowel syndrome (IBS). Having endometriosis. Having chronic fatigue syndrome. This condition may be aggravated by: Stress. Smoking. Spicy foods. What are the signs or symptoms? Symptoms of interstitial cystitis vary, and they can change over time. Symptoms may include: Discomfort or pain in the bladder area, which is in the lower abdomen. Pain can range from mild to severe. The pain may change in intensity as the bladder fills with urine or as it empties. Pain in the pelvic area, between the hip bones. A constant urge to urinate. Frequent urination. Pain during urination. Pain during sex. Blood in the urine. Feeling tired (fatigue). For women, symptoms often get worse during menstruation. How is this diagnosed? This condition is diagnosed based on your symptoms, your medical history, and a physical exam. Your health care provider may need to rule out other conditions and may order other tests, such as: Urine tests. Cystoscopy. For this test, a tool similar to a very thin telescope is used to look into your bladder. Biopsy. This involves taking a sample of tissue from the bladder to be examined under a microscope. How is this treated? There is no cure for this condition, but  treatment can help you control your symptoms. Work closely with your health care provider to find the most effective treatments for you. Treatment options may include: Medicines to relieve pain and reduce how often you feel the need to urinate. This treatment may include: A procedure where a small amount of medicine that eases irritation is put inside your bladder through a catheter (bladder instillation). Lifestyle changes, such as changing your diet or taking steps to control stress. Physical therapy. This may include: Exercises to help relax the pelvic floor muscles. Massage to relax tight muscles (myofascial release). Learning ways to control when you urinate (bladder training). Using a device that provides electrical stimulation to your nerves, which can relieve pain (neuromodulation therapy). The device is placed on your back, where it blocks the nerves that cause you to feel pain in your bladder area. A procedure that stretches your bladder by filling it with air or fluid (hydrodistention). Surgery. This is rare. It is only done for extreme cases, if other treatments do not help. Follow these instructions at home: Lifestyle Learn and practice relaxation techniques, such as deep breathing and muscle relaxation. Get care for your body and mental well-being, such as: Cognitive behavioral therapy (CBT). This therapy changes the way you think or act in response to different situations. This may improve how you feel. Seeing a mental health therapist to evaluate and treat depression, if necessary. Work with your health care provider on other ways to manage pain. Acupuncture may be helpful. Avoid drinking alcohol. Do not use any products that contain nicotine or tobacco. These products include cigarettes, chewing tobacco, and vaping devices, such   as e-cigarettes. If you need help quitting, ask your health care provider. Eating and drinking Make dietary changes as recommended by your health care  provider. You may need to avoid: Spicy foods. Foods that contain a lot of potassium. Limit your intake of drinks that increase your urge to urinate. These include alcohol and caffeinated drinks like soda, coffee, and tea. Bladder training  Use bladder training techniques as directed. Techniques may include: Urinating at scheduled times. Training yourself to delay urination. Keep a bladder diary. Write down the times you urinate and any symptoms that you have. This can help you find out which foods, liquids, or activities make your symptoms worse. Use your bladder diary to schedule bathroom trips. If you are away from home, plan to be near a bathroom at each of your scheduled times. Make sure that you urinate just before you leave the house and just before you go to bed. General instructions Take over-the-counter and prescription medicines only as told by your health care provider. Try a warm or cool compress over your bladder for comfort. Avoid wearing tight clothing. Do exercises to relax your pelvic floor muscles as told by your physical therapist. Keep all follow-up visits. This is important. Where to find more information To find more information or a support group near you, visit: [facility] [facility]: urologyhealth.org Interstitial Cystitis Association: ichelp.org Contact a health care provider if you have: Symptoms that do not get better with treatment. Pain or discomfort that gets worse. More frequent urges to urinate. A fever. Get help right away if: You have no control over when you urinate. Summary Interstitial cystitis is inflammation of the bladder. This condition may cause pain in the bladder area as well as a frequent and urgent need to urinate. You may have flare-ups of the condition, and then it may go away for a while. For many people, it becomes a long-term (chronic) problem. There is no cure for interstitial cystitis, but treatment methods are available to  control your symptoms. This information is not intended to replace advice given to you by your health care provider. Make sure you discuss any questions you have with your health care provider. Document Revised: 04/05/2020 Document Reviewed: 04/11/2020 Elsevier Patient Education  2024 [employer]  

## 2023-03-29 NOTE — Progress Notes (Signed)
MyChart Video visit  Subjective: CC:UTI PCP: Junie Spencer, FNP [id number] Ruth Hopkins is a 57 y.o. female. Patient provides verbal consent for consult held via video.  Due to COVID-19 pandemic this visit was conducted virtually. This visit type was conducted due to national recommendations for restrictions regarding the COVID-19 Pandemic (e.g. social distancing, sheltering in place) in an effort to limit this patient's exposure and mitigate transmission in our community. All issues noted in this document were discussed and addressed.  A physical exam was not performed with this format.   Location of patient: car Location of provider: [facility] Others present for call: none  1. UTI Patient reports that she has been having urinary frequency/ urgency for the last few weeks.  No fevers, upper back pain.  Mild intermittent nausea.  No blood in urine.  Not using any OTC meds for symptoms.  She has had similar before and was told urine negative for UTI.  She has h/o fibromyalgia.   ROS: Per HPI  Allergies  Allergen Reactions   Latex Itching and Rash   Codeine    Gabapentin     "fuzzy"   Sulfur Itching   Sulfa Antibiotics Hives, Itching and Rash   Past Medical History:  Diagnosis Date   Chronic fatigue    Depression    Fibromyalgia    IBS (irritable bowel syndrome)    Migraine    Thyroid disease     Current Outpatient Medications:    acetaminophen (TYLENOL) 500 MG tablet, Take by mouth., Disp: , Rfl:    clonazePAM (KLONOPIN) 0.5 MG tablet, Take 1 tablet (0.5 mg total) by mouth 2 (two) times daily., Disp: 30 tablet, Rfl: 2   cycloSPORINE (RESTASIS) 0.05 % ophthalmic emulsion, Restasis 0.05 % eye drops in a dropperette  USE 1 DROP INTO BOTH EYES TWICE A DAY, Disp: , Rfl:    eletriptan (RELPAX) 40 MG tablet, TAKE 1 TABLET BY MOUTH EVERY 2 HOURS AS NEEDED FOR MIGRAINE HEADACHE . DO NOT EXCEED 6 PER 24 HOURS, Disp: 12 tablet, Rfl: 0   estradiol (VIVELLE-DOT) 0.075 MG/24HR, Place 1 patch onto  the skin 2 (two) times a week., Disp: , Rfl:    Fluocinolone Acetonide (FLAC) 0.01 % OIL, Place 1 drop in ear(s) daily. Place 1-2 drop in right ear, Disp: 20 mL, Rfl: 0   hydrocortisone (ANUSOL-HC) 25 MG suppository, Place 1 suppository (25 mg total) rectally 2 (two) times daily., Disp: 12 suppository, Rfl: 0   imipramine (TOFRANIL) 10 MG tablet, Take 3 tablets (30 mg total) by mouth at bedtime., Disp: 90 tablet, Rfl: 11   levothyroxine (SYNTHROID) 50 MCG tablet, TAKE 1 TABLET BY MOUTH ONCE DAILY BEFORE BREAKFAST, Disp: 90 tablet, Rfl: 1   meclizine (ANTIVERT) 50 MG tablet, Take 1 tablet (50 mg total) by mouth 3 (three) times daily as needed., Disp: 30 tablet, Rfl: 0   methocarbamol (ROBAXIN) 500 MG tablet, Take 1 tablet (500 mg total) by mouth every 8 (eight) hours as needed for muscle spasms., Disp: 90 tablet, Rfl: 1   midodrine (PROAMATINE) 2.5 MG tablet, Take 1 tablet (2.5 mg total) by mouth 3 (three) times daily with meals. (Patient not taking: Reported on 01/20/2023), Disp: 90 tablet, Rfl: 1   mometasone (NASONEX) 50 MCG/ACT nasal spray, PLACE 2 SPRAYS INTO THE NOSE DAILY., Disp: 51 each, Rfl: 1   Multiple Vitamins-Minerals (MULTIVITAMIN PO), Take by mouth., Disp: , Rfl:    omeprazole (PRILOSEC) 20 MG capsule, Take 1 capsule (20 mg total) by mouth  daily., Disp: 90 capsule, Rfl: 1   ondansetron (ZOFRAN) 4 MG tablet, Take 1 tablet (4 mg total) by mouth every 8 (eight) hours as needed for nausea or vomiting., Disp: 20 tablet, Rfl: 0   pentosan polysulfate (ELMIRON) 100 MG capsule, Take 1 capsule (100 mg total) by mouth 3 (three) times daily., Disp: 90 capsule, Rfl: 1   tacrolimus (PROTOPIC) 0.1 % ointment, tacrolimus 0.1 % topical ointment  APPLY OINTMENT TOPICALLY TO AFFECTED AREA TWICE DAILY, Disp: , Rfl:    topiramate (TOPAMAX) 25 MG tablet, Take 1 tablet (25 mg total) by mouth 2 (two) times daily., Disp: 60 tablet, Rfl: 1   valACYclovir (VALTREX) 500 MG tablet, Take 500 mg by mouth daily as  needed., Disp: , Rfl:   Gen: nontoxic female, NAD  Assessment/ Plan: 57 y.o. female   Suspected UTI - Plan: Urinalysis, Routine w reflex microscopic, Urine Culture, cephALEXin (KEFLEX) 500 MG capsule, fluconazole (DIFLUCAN) 150 MG tablet  Question possible interstitial cystitis given history of chronic pain syndrome.  Urinalysis did show few bacteria but otherwise was unremarkable.  We have sent this for urine culture.  She did want to go ahead and start treatment whilst waiting on that culture.  I given her information about interstitial cystitis and offered her referral to urologist should she desire going forward.  Push oral fluids.  Monitor for concerning symptoms or signs.  Follow-up as needed  Start time: 1:33pm End time: 1:46pm  Total time spent on patient care (including video visit/ documentation): 9 minutes  Ailsa Mireles Hulen Skains, DO [facility] [phone]

## 2023-03-30 DIAGNOSIS — K529 Noninfective gastroenteritis and colitis, unspecified: Secondary | ICD-10-CM | POA: Diagnosis not present

## 2023-03-31 LAB — URINE CULTURE

## 2023-04-01 ENCOUNTER — Ambulatory Visit (INDEPENDENT_AMBULATORY_CARE_PROVIDER_SITE_OTHER): Payer: Medicare HMO | Admitting: Family

## 2023-04-01 ENCOUNTER — Encounter: Payer: Self-pay | Admitting: Family

## 2023-04-01 VITALS — BP 103/63 | HR 82 | Temp 97.6°F | Ht 64.0 in | Wt 127.0 lb

## 2023-04-01 DIAGNOSIS — M5441 Lumbago with sciatica, right side: Secondary | ICD-10-CM | POA: Diagnosis not present

## 2023-04-01 MED ORDER — PREDNISONE 20 MG PO TABS
40.0000 mg | ORAL_TABLET | Freq: Every day | ORAL | 0 refills | Status: AC
Start: 2023-04-01 — End: 2023-04-06

## 2023-04-01 NOTE — Progress Notes (Signed)
   Subjective:    Patient ID: Ruth Hopkins, female    DOB: December 22, 1965, 57 y.o.   MRN: [id number]  Chief Complaint  Patient presents with   Sciatica   Pt presents to the office today with lower back pain that radiates down right leg that started three days ago.  Back Pain This is a recurrent problem. The current episode started in the past 7 days. The problem occurs intermittently. The problem has been waxing and waning since onset. The pain is present in the lumbar spine. The quality of the pain is described as aching. The pain radiates to the right thigh and right knee. The pain is at a severity of 7/10. The pain is moderate. Associated symptoms include leg pain and numbness. She has tried bed rest for the symptoms. The treatment provided mild relief.      Review of Systems  Musculoskeletal:  Positive for back pain.  Neurological:  Positive for numbness.  All other systems reviewed and are negative.      Objective:   Physical Exam Vitals reviewed.  Constitutional:      General: She is not in acute distress.    Appearance: She is well-developed.  HENT:     Head: Normocephalic and atraumatic.  Eyes:     Pupils: Pupils are equal, round, and reactive to light.  Neck:     Thyroid: No thyromegaly.  Cardiovascular:     Rate and Rhythm: Normal rate and regular rhythm.     Heart sounds: Normal heart sounds. No murmur heard. Pulmonary:     Effort: Pulmonary effort is normal. No respiratory distress.     Breath sounds: Normal breath sounds. No wheezing.  Abdominal:     General: Bowel sounds are normal. There is no distension.     Palpations: Abdomen is soft.     Tenderness: There is no abdominal tenderness.  Musculoskeletal:        General: No tenderness. Normal range of motion.     Cervical back: Normal range of motion and neck supple.     Comments: Pain in lumbar with flexion and extension  Skin:    General: Skin is warm and dry.  Neurological:     Mental Status: She is alert  and oriented to person, place, and time.     Cranial Nerves: No cranial nerve deficit.     Deep Tendon Reflexes: Reflexes are normal and symmetric.  Psychiatric:        Behavior: Behavior normal.        Thought Content: Thought content normal.        Judgment: Judgment normal.     BP 103/63   Pulse 82   Temp 97.6 F (36.4 C) (Temporal)   Ht 5\' 4"  (1.626 m)   Wt 127 lb (57.6 kg)   SpO2 98%   BMI 21.80 kg/m        Assessment & Plan:   Ruth Hopkins comes in today with chief complaint of Sciatica   Diagnosis and orders addressed:  1. Acute right-sided low back pain with right-sided sciatica Rest Ice ROM exercises- handout given Start prednisone today Can not have NSAIDs at this time Robaxin as needed Follow up if symptoms worsen or do not improve  - predniSONE (DELTASONE) 20 MG tablet; Take 2 tablets (40 mg total) by mouth daily with breakfast for 5 days.  Dispense: 10 tablet; Refill: 0  Jannifer Rodney, FNP

## 2023-04-01 NOTE — Patient Instructions (Signed)

## 2023-04-06 ENCOUNTER — Telehealth: Payer: Self-pay | Admitting: Family

## 2023-04-06 DIAGNOSIS — M797 Fibromyalgia: Secondary | ICD-10-CM | POA: Diagnosis not present

## 2023-04-06 DIAGNOSIS — K581 Irritable bowel syndrome with constipation: Secondary | ICD-10-CM | POA: Diagnosis not present

## 2023-04-06 DIAGNOSIS — N951 Menopausal and female climacteric states: Secondary | ICD-10-CM | POA: Diagnosis not present

## 2023-04-06 DIAGNOSIS — Z122 Encounter for screening for malignant neoplasm of respiratory organs: Secondary | ICD-10-CM

## 2023-04-06 DIAGNOSIS — E039 Hypothyroidism, unspecified: Secondary | ICD-10-CM | POA: Diagnosis not present

## 2023-04-06 DIAGNOSIS — G43909 Migraine, unspecified, not intractable, without status migrainosus: Secondary | ICD-10-CM | POA: Diagnosis not present

## 2023-04-06 DIAGNOSIS — M1990 Unspecified osteoarthritis, unspecified site: Secondary | ICD-10-CM | POA: Diagnosis not present

## 2023-04-06 DIAGNOSIS — J309 Allergic rhinitis, unspecified: Secondary | ICD-10-CM | POA: Diagnosis not present

## 2023-04-06 DIAGNOSIS — F32A Depression, unspecified: Secondary | ICD-10-CM | POA: Diagnosis not present

## 2023-04-06 DIAGNOSIS — M62838 Other muscle spasm: Secondary | ICD-10-CM | POA: Diagnosis not present

## 2023-04-06 DIAGNOSIS — F419 Anxiety disorder, unspecified: Secondary | ICD-10-CM | POA: Diagnosis not present

## 2023-04-06 DIAGNOSIS — M5440 Lumbago with sciatica, unspecified side: Secondary | ICD-10-CM | POA: Diagnosis not present

## 2023-04-06 DIAGNOSIS — K219 Gastro-esophageal reflux disease without esophagitis: Secondary | ICD-10-CM | POA: Diagnosis not present

## 2023-04-07 NOTE — Telephone Encounter (Signed)
Referral placed.   Christy Hawks, FNP  

## 2023-04-07 NOTE — Telephone Encounter (Signed)
Left detailed message.   

## 2023-04-13 ENCOUNTER — Ambulatory Visit: Payer: Medicare HMO

## 2023-04-13 VITALS — Ht 64.0 in | Wt 125.0 lb

## 2023-04-13 DIAGNOSIS — Z0000 Encounter for general adult medical examination without abnormal findings: Secondary | ICD-10-CM

## 2023-04-13 NOTE — Patient Instructions (Signed)
Ruth Hopkins , Thank you for taking time to come for your Medicare Wellness Visit. I appreciate your ongoing commitment to your health goals. Please review the following plan we discussed and let me know if I can assist you in the future.   These are the goals we discussed:  Goals      DIET - INCREASE WATER INTAKE     LIFESTYLE - ATTEND STRESS MANAGEMENT CLASSES     CCM referral sent     Quit Smoking     Back up to 1 ppd        This is a list of the screening recommended for you and due dates:  Health Maintenance  Topic Date Due   COVID-19 Vaccine (1) Never done   Zoster (Shingles) Vaccine (1 of 2) Never done   Screening for Lung Cancer  Never done   Pap Smear  06/15/2021   Flu Shot  04/21/2023   Mammogram  11/24/2023   Medicare Annual Wellness Visit  04/12/2024   Colon Cancer Screening  12/30/2027   DTaP/Tdap/Td vaccine (5 - Td or Tdap) 05/12/2031   Hepatitis C Screening  Completed   HIV Screening  Completed   HPV Vaccine  Aged Out    Advanced directives: Advance directive discussed with you today. I have provided a copy for you to complete at home and have notarized. Once this is complete please bring a copy in to our office so we can scan it into your chart. Information on Advanced Care Planning can be found at [facility] Advance Health Care Directives Advance Health Care Directives ([email or web address]    Conditions/risks identified: Aim for 30 minutes of exercise or brisk walking, 6-8 glasses of water, and 5 servings of fruits and vegetables each day.   Next appointment: Follow up in one year for your annual wellness visit.   Preventive Care 40-64 Years, Female Preventive care refers to lifestyle choices and visits with your health care provider that can promote health and wellness. What does preventive care include? A yearly physical exam. This is also called an annual well check. Dental exams once or twice a year. Routine eye exams. Ask your health care  provider how often you should have your eyes checked. Personal lifestyle choices, including: Daily care of your teeth and gums. Regular physical activity. Eating a healthy diet. Avoiding tobacco and drug use. Limiting alcohol use. Practicing safe sex. Taking low-dose aspirin daily starting at age 41. Taking vitamin and mineral supplements as recommended by your health care provider. What happens during an annual well check? The services and screenings done by your health care provider during your annual well check will depend on your age, overall health, lifestyle risk factors, and family history of disease. Counseling  Your health care provider may ask you questions about your: Alcohol use. Tobacco use. Drug use. Emotional well-being. Home and relationship well-being. Sexual activity. Eating habits. Work and work Astron[employer]. Method of birth control. Menstrual cycle. Pregnancy history. Screening  You may have the following tests or measurements: Height, weight, and BMI. Blood pressure. Lipid and cholesterol levels. These may be checked every 5 years, or more frequently if you are over 59 years old. Skin check. Lung cancer screening. You may have this screening every year starting at age 87 if you have a 30-pack-year history of smoking and currently smoke or have quit within the past 15 years. Fecal occult blood test (FOBT) of the stool. You may have this test every year starting  at age 45. Flexible sigmoidoscopy or colonoscopy. You may have a sigmoidoscopy every 5 years or a colonoscopy every 10 years starting at age 32. Hepatitis C blood test. Hepatitis B blood test. Sexually transmitted disease (STD) testing. Diabetes screening. This is done by checking your blood sugar (glucose) after you have not eaten for a while (fasting). You may have this done every 1-3 years. Mammogram. This may be done every 1-2 years. Talk to your health care provider about when you should start  having regular mammograms. This may depend on whether you have a family history of breast cancer. BRCA-related cancer screening. This may be done if you have a family history of breast, ovarian, tubal, or peritoneal cancers. Pelvic exam and Pap test. This may be done every 3 years starting at age 69. Starting at age 37, this may be done every 5 years if you have a Pap test in combination with an HPV test. Bone density scan. This is done to screen for osteoporosis. You may have this scan if you are at high risk for osteoporosis. Discuss your test results, treatment options, and if necessary, the need for more tests with your health care provider. Vaccines  Your health care provider may recommend certain vaccines, such as: Influenza vaccine. This is recommended every year. Tetanus, diphtheria, and acellular pertussis (Tdap, Td) vaccine. You may need a Td booster every 10 years. Zoster vaccine. You may need this after age 46. Pneumococcal 13-valent conjugate (PCV13) vaccine. You may need this if you have certain conditions and were not previously vaccinated. Pneumococcal polysaccharide (PPSV23) vaccine. You may need one or two doses if you smoke cigarettes or if you have certain conditions. Talk to your health care provider about which screenings and vaccines you need and how often you need them. This information is not intended to replace advice given to you by your health care provider. Make sure you discuss any questions you have with your health care provider. Document Released: 10/03/2015 Document Revised: 05/26/2016 Document Reviewed: 07/08/2015 Elsevier Interactive Patient Education  2017 [employer].    Fall Prevention in the Home Falls can cause injuries. They can happen to people of all ages. There are many things you can do to make your home safe and to help prevent falls. What can I do on the outside of my home? Regularly fix the edges of walkways and driveways and fix any  cracks. Remove anything that might make you trip as you walk through a door, such as a raised step or threshold. Trim any bushes or trees on the path to your home. Use bright outdoor lighting. Clear any walking paths of anything that might make someone trip, such as rocks or tools. Regularly check to see if handrails are loose or broken. Make sure that both sides of any steps have handrails. Any raised decks and porches should have guardrails on the edges. Have any leaves, snow, or ice cleared regularly. Use sand or salt on walking paths during winter. Clean up any spills in your garage right away. This includes oil or grease spills. What can I do in the bathroom? Use night lights. Install grab bars by the toilet and in the tub and shower. Do not use towel bars as grab bars. Use non-skid mats or decals in the tub or shower. If you need to sit down in the shower, use a plastic, non-slip stool. Keep the floor dry. Clean up any water that spills on the floor as soon as it happens. Remove  soap buildup in the tub or shower regularly. Attach bath mats securely with double-sided non-slip rug tape. Do not have throw rugs and other things on the floor that can make you trip. What can I do in the bedroom? Use night lights. Make sure that you have a light by your bed that is easy to reach. Do not use any sheets or blankets that are too big for your bed. They should not hang down onto the floor. Have a firm chair that has side arms. You can use this for support while you get dressed. Do not have throw rugs and other things on the floor that can make you trip. What can I do in the kitchen? Clean up any spills right away. Avoid walking on wet floors. Keep items that you use a lot in easy-to-reach places. If you need to reach something above you, use a strong step stool that has a grab bar. Keep electrical cords out of the way. Do not use floor polish or wax that makes floors slippery. If you must  use wax, use non-skid floor wax. Do not have throw rugs and other things on the floor that can make you trip. What can I do with my stairs? Do not leave any items on the stairs. Make sure that there are handrails on both sides of the stairs and use them. Fix handrails that are broken or loose. Make sure that handrails are as long as the stairways. Check any carpeting to make sure that it is firmly attached to the stairs. Fix any carpet that is loose or worn. Avoid having throw rugs at the top or bottom of the stairs. If you do have throw rugs, attach them to the floor with carpet tape. Make sure that you have a light switch at the top of the stairs and the bottom of the stairs. If you do not have them, ask someone to add them for you. What else can I do to help prevent falls? Wear shoes that: Do not have high heels. Have rubber bottoms. Are comfortable and fit you well. Are closed at the toe. Do not wear sandals. If you use a stepladder: Make sure that it is fully opened. Do not climb a closed stepladder. Make sure that both sides of the stepladder are locked into place. Ask someone to hold it for you, if possible. Clearly mark and make sure that you can see: Any grab bars or handrails. First and last steps. Where the edge of each step is. Use tools that help you move around (mobility aids) if they are needed. These include: Canes. Walkers. Scooters. Crutches. Turn on the lights when you go into a dark area. Replace any light bulbs as soon as they burn out. Set up your furniture so you have a clear path. Avoid moving your furniture around. If any of your floors are uneven, fix them. If there are any pets around you, be aware of where they are. Review your medicines with your doctor. Some medicines can make you feel dizzy. This can increase your chance of falling. Ask your doctor what other things that you can do to help prevent falls. This information is not intended to replace  advice given to you by your health care provider. Make sure you discuss any questions you have with your health care provider. Document Released: 07/03/2009 Document Revised: 02/12/2016 Document Reviewed: 10/11/2014 Elsevier Interactive Patient Education  2017 [employer].

## 2023-04-13 NOTE — Progress Notes (Signed)
Subjective:   Ruth Hopkins is a 57 y.o. female who presents for Medicare Annual (Subsequent) preventive examination.  Visit Complete: Virtual  I connected with  [name] on 04/13/23 by a audio enabled telemedicine application and verified that I am speaking with the correct person using two identifiers.  Patient Location: Home  Provider Location: Home Office  I discussed the limitations of evaluation and management by telemedicine. The patient expressed understanding and agreed to proceed.  Patient Medicare AWV questionnaire was completed by the patient on 04/13/2023; I have confirmed that all information answered by patient is correct and no changes since this date.  Review of Systems    Per patient no change in vitals since last visit; unable to obtain new vitals due to this being a telehealth visit. Patient was unable to self-report vital signs via telehealth due to a lack of equipment at home.  Cardiac Risk Factors include: advanced age (>41men, >81 women)     Objective:    Today's Vitals   04/13/23 1201  Weight: 125 lb (56.7 kg)  Height: 5\' 4"  (1.626 m)   Body mass index is 21.46 kg/m.     04/13/2023   12:05 PM 02/19/2022   11:25 AM 02/18/2021   11:32 AM  Advanced Directives  Does Patient Have a Medical Advance Directive? No No No  Would patient like information on creating a medical advance directive? Yes (MAU/Ambulatory/Procedural Areas - Information given) No - Patient declined No - Patient declined    Current Medications (verified) Outpatient Encounter Medications as of 04/13/2023  Medication Sig   acetaminophen (TYLENOL) 500 MG tablet Take by mouth.   clonazePAM (KLONOPIN) 0.5 MG tablet Take 1 tablet (0.5 mg total) by mouth 2 (two) times daily.   cycloSPORINE (RESTASIS) 0.05 % ophthalmic emulsion Restasis 0.05 % eye drops in a dropperette  USE 1 DROP INTO BOTH EYES TWICE A DAY   eletriptan (RELPAX) 40 MG tablet TAKE 1 TABLET BY MOUTH EVERY 2 HOURS AS NEEDED  FOR MIGRAINE HEADACHE . DO NOT EXCEED 6 PER 24 HOURS   estradiol (VIVELLE-DOT) 0.075 MG/24HR Place 1 patch onto the skin 2 (two) times a week.   finasteride (PROSCAR) 5 MG tablet Take by mouth.   Fluocinolone Acetonide (FLAC) 0.01 % OIL Place 1 drop in ear(s) daily. Place 1-2 drop in right ear   hydrocortisone (ANUSOL-HC) 25 MG suppository Place 1 suppository (25 mg total) rectally 2 (two) times daily.   imipramine (TOFRANIL) 10 MG tablet Take 3 tablets (30 mg total) by mouth at bedtime.   levothyroxine (SYNTHROID) 50 MCG tablet TAKE 1 TABLET BY MOUTH ONCE DAILY BEFORE BREAKFAST   meclizine (ANTIVERT) 50 MG tablet Take 1 tablet (50 mg total) by mouth 3 (three) times daily as needed.   methocarbamol (ROBAXIN) 500 MG tablet Take 1 tablet (500 mg total) by mouth every 8 (eight) hours as needed for muscle spasms.   mometasone (NASONEX) 50 MCG/ACT nasal spray PLACE 2 SPRAYS INTO THE NOSE DAILY.   Multiple Vitamins-Minerals (MULTIVITAMIN PO) Take by mouth.   omeprazole (PRILOSEC) 20 MG capsule Take 1 capsule (20 mg total) by mouth daily.   ondansetron (ZOFRAN) 4 MG tablet Take 1 tablet (4 mg total) by mouth every 8 (eight) hours as needed for nausea or vomiting.   pentosan polysulfate (ELMIRON) 100 MG capsule Take 1 capsule (100 mg total) by mouth 3 (three) times daily.   tacrolimus (PROTOPIC) 0.1 % ointment tacrolimus 0.1 % topical ointment  APPLY OINTMENT TOPICALLY  TO AFFECTED AREA TWICE DAILY   topiramate (TOPAMAX) 25 MG tablet Take 1 tablet (25 mg total) by mouth 2 (two) times daily.   valACYclovir (VALTREX) 500 MG tablet Take 500 mg by mouth daily as needed.   midodrine (PROAMATINE) 2.5 MG tablet Take 1 tablet (2.5 mg total) by mouth 3 (three) times daily with meals. (Patient not taking: Reported on 04/13/2023)   No facility-administered encounter medications on file as of 04/13/2023.    Allergies (verified) Latex, Codeine, Gabapentin, Sulfur, and Sulfa antibiotics   History: Past Medical  History:  Diagnosis Date   Chronic fatigue    Depression    Fibromyalgia    IBS (irritable bowel syndrome)    Migraine    Thyroid disease    Past Surgical History:  Procedure Laterality Date   BREAST SURGERY Right 2007   lumpectomy   FOOT SURGERY     Family History  Problem Relation Age of Onset   Asthma Mother    Hypothyroidism Mother    Hypertension Mother    Raynaud syndrome Mother    Hypertension Father    Social History   Socioeconomic History   Marital status: Married    Spouse name: Not on file   Number of children: 0   Years of education: Not on file   Highest education level: Not on file  Occupational History   Occupation: disability  Tobacco Use   Smoking status: Former    Current packs/day: 0.00    Average packs/day: 1 pack/day for 35.0 years (35.0 ttl pk-yrs)    Types: Cigarettes    Start date: 11/03/1987    Quit date: 11/02/2022    Years since quitting: 0.4   Smokeless tobacco: Never   Tobacco comments:    Smoking since age 22 - 1ppd usually - has tried to quit several times - is afraid it will make her gain weight    Started smoking again a few weeks ago, maybe 15 ciggs per day --12/16/2022  Vaping Use   Vaping status: Never Used  Substance and Sexual Activity   Alcohol use: No   Drug use: No   Sexual activity: Not on file  Other Topics Concern   Not on file  Social History Narrative   Lives home with husband in one level home   Her mother and sister live nearby   [place] faith   Social Determinants of Health   Financial Resource Strain: Low Risk  (04/13/2023)   Overall Financial Resource Strain (CARDIA)    Difficulty of Paying Living Expenses: Not hard at all  Food Insecurity: No Food Insecurity (04/13/2023)   Hunger Vital Sign    Worried About Running Out of Food in the Last Year: Never true    Ran Out of Food in the Last Year: Never true  Transportation Needs: No Transportation Needs (04/13/2023)   PRAPARE - Therapist, art (Medical): No    Lack of Transportation (Non-Medical): No  Physical Activity: Inactive (04/13/2023)   Exercise Vital Sign    Days of Exercise per Week: 0 days    Minutes of Exercise per Session: 0 min  Stress: No Stress Concern Present (04/13/2023)   [employer] of Occupational Health - Occupational Stress Questionnaire    Feeling of Stress : Not at all  Social Connections: Moderately Integrated (04/13/2023)   Social Connection and Isolation Panel [NHANES]    Frequency of Communication with Friends and Family: More than three times a week  Frequency of Social Gatherings with Friends and Family: More than three times a week    Attends Religious Services: More than 4 times per year    Active Member of [employer] or Organizations: No    Attends Banker Meetings: Never    Marital Status: Married    Tobacco Counseling Counseling given: Not Answered Tobacco comments: Smoking since age 50 - 1ppd usually - has tried to quit several times - is afraid it will make her gain weight Started smoking again a few weeks ago, maybe 15 ciggs per day --12/16/2022   Clinical Intake:  Pre-visit preparation completed: Yes  Pain : No/denies pain     Nutritional Risks: None Diabetes: No  How often do you need to have someone help you when you read instructions, pamphlets, or other written materials from your doctor or pharmacy?: 1 - Never  Interpreter Needed?: No  Information entered by :: Renie Ora, LPN   Activities of Daily Living    04/13/2023   12:05 PM  In your present state of health, do you have any difficulty performing the following activities:  Hearing? 0  Vision? 0  Difficulty concentrating or making decisions? 0  Walking or climbing stairs? 0  Dressing or bathing? 0  Doing errands, shopping? 0  Preparing Food and eating ? N  Using the Toilet? N  In the past six months, have you accidently leaked urine? N  Do you have problems with loss  of bowel control? N  Managing your Medications? N  Managing your Finances? N  Housekeeping or managing your Housekeeping? N    Patient Care Team: Junie Spencer, FNP as PCP - General (Family Medicine) Richarda Overlie, MD as Consulting Physician (Obstetrics and Gynecology) Janalyn Harder, MD (Inactive) as Consulting Physician (Dermatology) Sheran Luz, MD as Consulting Physician (Physical Medicine and Rehabilitation) Genevive Bi (Hematology and Oncology) Ellis Savage, NP as Nurse Practitioner (Psychology) Charlsie Merles Kirstie Peri, DPM as Consulting Physician (Podiatry)  Indicate any recent Medical Services you may have received from other than [facility] providers in the past year (date may be approximate).     Assessment:   This is a routine wellness examination for Ruth Hopkins.  Hearing/Vision screen Vision Screening - Comments:: Wears rx glasses - up to date with routine eye exams with  Dr.johnson   Dietary issues and exercise activities discussed:     Goals Addressed             This Visit's Progress    DIET - INCREASE WATER INTAKE         Depression Screen    04/13/2023   12:04 PM 04/01/2023   12:07 PM 01/04/2023   11:11 AM 11/11/2022    2:05 PM 11/05/2022    2:32 PM 10/29/2022   12:21 PM 10/25/2022   11:57 AM  PHQ 2/9 Scores  PHQ - 2 Score 0 0 0 1 1 1 1   PHQ- 9 Score 0 0  2 2 3 2     Fall Risk    04/13/2023   12:01 PM 01/20/2023    3:08 PM 01/04/2023   11:11 AM 10/29/2022   12:21 PM 10/11/2022   12:45 PM  Fall Risk   Falls in the past year? 0 0 0 1 1  Number falls in past yr: 0 0 0 0 0  Injury with Fall? 0 0 0 0 0  Risk for fall due to : No Fall Risks   History of fall(s) History of fall(s)  Follow  up Falls prevention discussed Falls evaluation completed Falls evaluation completed Falls evaluation completed Education provided    MEDICARE RISK AT HOME:  Medicare Risk at Home - 04/13/23 1202     Any stairs in or around the home? No    If so, are there any without  handrails? No    Home free of loose throw rugs in walkways, pet beds, electrical cords, etc? Yes    Adequate lighting in your home to reduce risk of falls? Yes    Life alert? No    Use of a cane, walker or w/c? No    Grab bars in the bathroom? Yes    Shower chair or bench in shower? Yes    Elevated toilet seat or a handicapped toilet? Yes             TIMED UP AND GO:  Was the test performed?  No    Cognitive Function:        04/13/2023   12:06 PM 02/19/2022   11:26 AM  6CIT Screen  What Year? 0 points 0 points  What month? 0 points 0 points  What time? 0 points 0 points  Count back from 20 0 points 0 points  Months in reverse 0 points 0 points  Repeat phrase 0 points 0 points  Total Score 0 points 0 points    Immunizations Immunization History  Administered Date(s) Administered   Influenza Inj Mdck Quad With Preservative 06/26/2019   Influenza,inj,Quad PF,6+ Mos 08/05/2018, 07/24/2020   Influenza-Unspecified 08/05/2018, 07/24/2020   MMR 07/07/2000   Td 05/11/2021   Td (Adult), 2 Lf Tetanus Toxid, Preservative Free 04/14/2000   Td (Adult),5 Lf Tetanus Toxid, Preservative Free 05/11/2021   Tdap 04/14/2000    TDAP status: Up to date  Flu Vaccine status: Declined, Education has been provided regarding the importance of this vaccine but patient still declined. Advised may receive this vaccine at local pharmacy or Health Dept. Aware to provide a copy of the vaccination record if obtained from local pharmacy or Health Dept. Verbalized acceptance and understanding.  Pneumococcal vaccine status: Due, Education has been provided regarding the importance of this vaccine. Advised may receive this vaccine at local pharmacy or Health Dept. Aware to provide a copy of the vaccination record if obtained from local pharmacy or Health Dept. Verbalized acceptance and understanding.  Covid-19 vaccine status: Completed vaccines  Qualifies for Shingles Vaccine? Yes   Zostavax  completed No   Shingrix Completed?: No.    Education has been provided regarding the importance of this vaccine. Patient has been advised to call insurance company to determine out of pocket expense if they have not yet received this vaccine. Advised may also receive vaccine at local pharmacy or Health Dept. Verbalized acceptance and understanding.  Screening Tests Health Maintenance  Topic Date Due   COVID-19 Vaccine (1) Never done   Zoster Vaccines- Shingrix (1 of 2) Never done   Lung Cancer Screening  Never done   PAP SMEAR-Modifier  06/15/2021   INFLUENZA VACCINE  04/21/2023   MAMMOGRAM  11/24/2023   Medicare Annual Wellness (AWV)  04/12/2024   Colonoscopy  12/30/2027   DTaP/Tdap/Td (5 - Td or Tdap) 05/12/2031   Hepatitis C Screening  Completed   HIV Screening  Completed   HPV VACCINES  Aged Out    Health Maintenance  Health Maintenance Due  Topic Date Due   COVID-19 Vaccine (1) Never done   Zoster Vaccines- Shingrix (1 of 2) Never done  Lung Cancer Screening  Never done   PAP SMEAR-Modifier  06/15/2021    Colorectal cancer screening: Type of screening: Colonoscopy. Completed 12/30/2022. Repeat every 5 years  Mammogram status: Completed 11/24/2022. Repeat every year  Bone Density status: Ordered not of age . Pt provided with contact info and advised to call to schedule appt.  Lung Cancer Screening: (Low Dose CT Chest recommended if Age 63-80 years, 20 pack-year currently smoking OR have quit w/in 15years.) does qualify.   Lung Cancer Screening Referral: Completed 04/07/2023  Additional Screening:  Hepatitis C Screening: does not qualify; Completed 01/21/2021  Vision Screening: Recommended annual ophthalmology exams for early detection of glaucoma and other disorders of the eye. Is the patient up to date with their annual eye exam?  Yes  Who is the provider or what is the name of the office in which the patient attends annual eye exams? Dr.johnson  If pt is not  established with a provider, would they like to be referred to a provider to establish care? No .   Dental Screening: Recommended annual dental exams for proper oral hygiene    Community Resource Referral / Chronic Care Management: CRR required this visit?  No   CCM required this visit?  No     Plan:     I have personally reviewed and noted the following in the patient's chart:   Medical and social history Use of alcohol, tobacco or illicit drugs  Current medications and supplements including opioid prescriptions. Patient is not currently taking opioid prescriptions. Functional ability and status Nutritional status Physical activity Advanced directives List of other physicians Hospitalizations, surgeries, and ER visits in previous 12 months Vitals Screenings to include cognitive, depression, and falls Referrals and appointments  In addition, I have reviewed and discussed with patient certain preventive protocols, quality metrics, and best practice recommendations. A written personalized care plan for preventive services as well as general preventive health recommendations were provided to patient.     Lorrene Reid, LPN   [id number]   After Visit Summary: (MyChart) Due to this being a telephonic visit, the after visit summary with patients personalized plan was offered to patient via MyChart   Nurse Notes: none

## 2023-04-16 ENCOUNTER — Other Ambulatory Visit: Payer: Self-pay | Admitting: Family

## 2023-04-16 DIAGNOSIS — G43901 Migraine, unspecified, not intractable, with status migrainosus: Secondary | ICD-10-CM

## 2023-05-07 DIAGNOSIS — Z20822 Contact with and (suspected) exposure to covid-19: Secondary | ICD-10-CM | POA: Diagnosis not present

## 2023-05-09 ENCOUNTER — Encounter: Payer: Self-pay | Admitting: Family

## 2023-05-09 ENCOUNTER — Ambulatory Visit (INDEPENDENT_AMBULATORY_CARE_PROVIDER_SITE_OTHER): Payer: Medicare HMO | Admitting: Family

## 2023-05-09 VITALS — BP 105/65 | HR 89 | Temp 97.5°F | Wt 126.4 lb

## 2023-05-09 DIAGNOSIS — Z122 Encounter for screening for malignant neoplasm of respiratory organs: Secondary | ICD-10-CM | POA: Diagnosis not present

## 2023-05-09 DIAGNOSIS — B880 Other acariasis: Secondary | ICD-10-CM

## 2023-05-09 DIAGNOSIS — F17200 Nicotine dependence, unspecified, uncomplicated: Secondary | ICD-10-CM | POA: Diagnosis not present

## 2023-05-09 DIAGNOSIS — R21 Rash and other nonspecific skin eruption: Secondary | ICD-10-CM | POA: Diagnosis not present

## 2023-05-09 MED ORDER — TRIAMCINOLONE ACETONIDE 0.5 % EX OINT: 1.0000 | TOPICAL_OINTMENT | Freq: Two times a day (BID) | CUTANEOUS | 0 refills | Status: DC

## 2023-05-09 NOTE — Patient Instructions (Addendum)
Scabies, Adult  Scabies is a skin condition that happens when very small insects called mites get under your skin. This causes severe itchiness and a rash that looks like pimples. Scabies is contagious. This means it can spread easily from person to person. If you get scabies, the people you live with may get it too. With the right treatment, symptoms often go away in 2-4 weeks. In most cases, scabies does not cause lasting problems. What are the causes? Scabies is caused by tiny mites (Sarcoptes scabiei) that can only be seen with a microscope. The mites get into the top layer of your skin and lay eggs. This is called an infestation. You may get scabies if: You have close contact with someone who has scabies. You come in contact with items that have the mites on them. These may include towels, bedding, or clothes. What increases the risk? You may be more likely to get scabies if: You live in a nursing home or extended care facility. You spend time in a place where a lot of people live close together, such as a shelter or prison. You have sex with a partner who has scabies. You care for others who are at risk for scabies. What are the signs or symptoms? Symptoms of scabies include: A rash that looks like pimples. It may include tiny red bumps or blisters. It is often found in the skinfolds or on the hands, wrists, elbows, armpits, chest, waist, groin, or buttocks. Severe itchiness. This is often worse at night. Skin irritation. This can include scaly patches or sores. The bumps from scabies may form a line (burrow) on the skin. The line may look thin, crooked, and grayish-white or skin colored. How is this diagnosed? Scabies may be diagnosed based on a physical exam of your skin. You may also have a skin test done. A sample of your skin may be taken (skin scraping) and looked at under a microscope for signs of mites. How is this treated? Scabies may be treated with: Medicated creams or  lotions to kill the mites. The cream or lotion is spread on your whole body and left for a few hours. In most cases, one treatment is enough to kill all the mites. In severe cases, the treatment may need to be done more than once. Medicated cream to help with the itching. Medicines taken by mouth (orally). These may help: Relieve itching. Reduce the swelling and redness. Kill the mites. This treatment may be used in severe cases. Follow these instructions at home: Medicines Take or apply over-the-counter and prescription medicines only as told by your health care provider. Apply medicated cream or lotion as told by your provider. Do not wash off the medicated cream or lotion until enough time has passed or as told by your provider. Skin care Try not to scratch or pick at the affected areas of your skin. Keep your fingernails closely trimmed. This can help reduce injury from scratching. Take cool baths or apply cool, wet cloths to your skin. This can help reduce itching. General instructions Clean all items that you touched in the 3 days before you were diagnosed. This includes bedding, clothes, towels, and furniture. Do this on the same day that you start treatment. Dry-clean items or use hot water to wash them. Dry them on the hot dry cycle. Place items that cannot be washed into closed, airtight plastic bags for at least 3 days. The mites cannot live for more than 3 days away from human  skin. Vacuum your furniture and mattresses. Make sure that other people who may have been infested see a provider. Where to find more information Centers for Disease Control and Prevention (CDC): TonerPromos.no Contact a health care provider if: You have itching that does not go away after 4 weeks of treatment. You keep getting new bumps or burrows. You have redness, swelling, or pain near your rash after treatment. You have fluid, blood, or pus coming from your rash. You get thick crusts or scaly patches over  large areas of your skin. You have a fever. This information is not intended to replace advice given to you by your health care provider. Make sure you discuss any questions you have with your health care provider. Document Revised: 06/14/2022 Document Reviewed: 06/14/2022 Elsevier Patient Education  2024 [employer] IT sales professional, Adult An insect bite can make your skin red, itchy, and swollen. An insect bite is different from an insect sting, which happens when an insect injects poison (venom) into the skin. Some insects can spread disease to people through a bite. However, most insect bites do not lead to disease and are not serious. What are the causes? Insects may bite for a variety of reasons, including: Hunger. To defend themselves. Insects that bite include: Spiders. Mosquitoes and flies. Ticks and fleas. Ants. Kissing bugs. Chiggers. What are the signs or symptoms? In many cases, symptoms last for 2-4 days. However, itching can last up to 10 days. Symptoms include: Itching or pain in the bite area. Redness and swelling in the bite area. An open wound (skin ulcer). In rare cases, a person may have a severe allergic reaction (anaphylactic reaction) to a bite. Symptoms of an anaphylactic reaction may include: Feeling warm in the face (flushed). This may include redness. Itchy, red, swollen areas of skin (hives). Swelling of the eyes, lips, face, mouth, tongue, or throat. Wheezing or difficulty breathing, speaking, or swallowing. Dizziness, light-headedness, or fainting. Abdominal symptoms like cramping, nausea, vomiting, or diarrhea. How is this diagnosed? This condition is usually diagnosed based on symptoms and a physical exam. During the exam, your health care provider will look at the bite and ask you what kind of insect bit you. How is this treated? Most insect bites are not serious. Symptoms often go away on their own and treatment is not usually needed. When treatment  is recommended, it may include: Applying ice to the affected area. Applying steroid or other anti-itch creams, like calamine lotion, to the bite area. Medicines called antihistamines to reduce itching. You may also need: A tetanus shot if you are not up to date. Antibiotic cream or an oral antibiotic if the bite becomes infected (this is uncommon). Follow these instructions at home: Bite area care  Do not scratch the bite area. It may help to cover the bite area with a bandage or close-fitting clothing. Keep the bite area clean and dry. Wash it every day with soap and water as told by your health care provider. Check the bite area every day for signs of infection. Check for: More redness, swelling, or pain. Fluid or blood. Warmth. Pus or a bad smell. Managing pain, itching, and swelling  You may apply cortisone cream, calamine lotion, or a paste made of baking soda and water to the bite area as told by your health care provider. If directed, put ice on the bite area. To do this: Put ice in a plastic bag. Place a towel between your skin and the bag. Leave the ice  on for 20 minutes, 2-3 times a day. If your skin turns bright red, remove the ice right away to prevent skin damage. The risk of skin damage is higher if you cannot feel pain, heat, or cold. General instructions Apply or take over-the-counter and prescription medicine only as told by your health care provider. If you were prescribed antibiotics, take or apply them as told by your health care provider. Do not stop using the antibiotic even if you start to feel better. How is this prevented? To help reduce your risk of insect bites: When you are outdoors, wear clothing that covers your arms and legs. This is especially important in the early morning and evening. Use insect repellent. The best insect repellents contain DEET, picaridin, oil of lemon eucalyptus (OLE), or IR3535. Consider spraying your clothing with a pesticide  called permethrin. Permethrin helps prevent insect bites. It works for several weeks and for up to 5-6 clothing washes. Do not apply permethrin directly to the skin. If your home windows do not have screens, consider installing them. If you will be sleeping in an area where there are mosquitoes, consider covering your sleeping area with a mosquito net. Contact a health care provider if: Your bite area has signs of infection, such as: More redness, swelling, or pain. Fluid or blood. Warmth. Pus or a bad smell. You have a fever. Get help right away if: You have a rash. You have muscle or joint pain. You feel unusually tired or weak. You have neck pain or a headache. You develop symptoms of an anaphylactic reaction. These may include: Swelling of the eyes, lips, face, mouth, tongue, or throat. Flushed skin or hives. Wheezing. Difficulty breathing, speaking, or swallowing. Dizziness, light-headedness, or fainting. Abdominal pain, cramping, vomiting, or diarrhea. These symptoms may be an emergency. Get help right away. Call 911. Do not wait to see if the symptoms will go away. Do not drive yourself to the hospital. Summary An insect bite can make your skin red, itchy, and swollen. Treatment is usually not needed. Symptoms often go away on their own. When treatment is recommended, it may involve taking medicine, applying medicine to the area, or applying ice. Apply or take over-the-counter and prescription medicines only as told by your health care provider. Use insect repellent to help prevent insect bites. Contact a health care provider if your bite area has signs of infection. This information is not intended to replace advice given to you by your health care provider. Make sure you discuss any questions you have with your health care provider. Document Revised: 12/16/2021 Document Reviewed: 12/01/2021 Elsevier Patient Education  2024 [employer].

## 2023-05-09 NOTE — Progress Notes (Signed)
   Subjective:    Patient ID: Ruth [facility], female    DOB: 12-03-1965, 57 y.o.   MRN: [id number]  Chief Complaint  Patient presents with   Rash   PT presents to the office today with a rash that started three days ago on lower abdomen, buttocks, and upper thighs. She states her mother and sister have COVID and was worried it was COVID. She has had negative home test.  Rash This is a new problem. The current episode started in the past 7 days. The rash is characterized by itchiness and redness. She was exposed to nothing. Past treatments include nothing. The treatment provided no relief.      Review of Systems  Skin:  Positive for rash.  All other systems reviewed and are negative.      Objective:   Physical Exam Vitals reviewed.  Constitutional:      General: She is not in acute distress.    Appearance: She is well-developed.  HENT:     Head: Normocephalic and atraumatic.     Right Ear: External ear normal.  Eyes:     Pupils: Pupils are equal, round, and reactive to light.  Neck:     Thyroid: No thyromegaly.  Cardiovascular:     Rate and Rhythm: Normal rate and regular rhythm.     Heart sounds: Normal heart sounds. No murmur heard. Pulmonary:     Effort: Pulmonary effort is normal. No respiratory distress.     Breath sounds: Normal breath sounds. No wheezing.  Abdominal:     General: Bowel sounds are normal. There is no distension.     Palpations: Abdomen is soft.     Tenderness: There is no abdominal tenderness.  Musculoskeletal:        General: No tenderness. Normal range of motion.     Cervical back: Normal range of motion and neck supple.  Skin:    General: Skin is warm and dry.     Findings: Rash present.          Comments: Scattered papule rash  Neurological:     Mental Status: She is alert and oriented to person, place, and time.     Cranial Nerves: No cranial nerve deficit.     Deep Tendon Reflexes: Reflexes are normal and symmetric.  Psychiatric:         Behavior: Behavior normal.        Thought Content: Thought content normal.        Judgment: Judgment normal.     BP 105/65   Pulse 89   Temp (!) 97.5 F (36.4 C) (Temporal)   Wt 126 lb 6.4 oz (57.3 kg)   SpO2 100%   BMI 21.70 kg/m        Assessment & Plan:   GUDELIA ABLER comes in today with chief complaint of Rash   Diagnosis and orders addressed:  1. Rash - Novel Coronavirus, NAA ([facility])  2. Chigger bites Apply kenalog cream  Avoid scratching  Keep clean and dry If rash spreads or papules become more clustered could be scabies  - triamcinolone ointment (KENALOG) 0.5 %; Apply 1 Application topically 2 (two) times daily.  Dispense: 60 g; Refill: 0   3. Current smoker - Ambulatory Referral Lung Cancer Screening [facility]  4. Screening for lung cancer - Ambulatory Referral Lung Cancer Screening [facility]   Jannifer Rodney, FNP

## 2023-05-10 LAB — NOVEL CORONAVIRUS, NAA: SARS-CoV-2, NAA: NOT DETECTED

## 2023-05-19 ENCOUNTER — Telehealth: Payer: Self-pay | Admitting: Family

## 2023-05-19 NOTE — Telephone Encounter (Signed)
Patient said she would like to see a pulmonologist within [facility]. Please call back and advise.

## 2023-05-20 NOTE — Telephone Encounter (Signed)
lmtcb

## 2023-05-20 NOTE — Telephone Encounter (Signed)
R/C to Patient - Phone did not ring, went straight to VM. LM for Patient.  At this time Patient does not have a Pulmonary Referral Referral, just a Referral for Lung Cancer Screening.  Trying to verify if she wants the CT done at [facility] or would like a Referral for Pulmonology to [facility].

## 2023-05-20 NOTE — Telephone Encounter (Signed)
Pt does not need to see Pulmonologist, just get yearly CT scan completed for lung cancer screening.

## 2023-06-02 DIAGNOSIS — Z86000 Personal history of in-situ neoplasm of breast: Secondary | ICD-10-CM | POA: Diagnosis not present

## 2023-06-02 DIAGNOSIS — R92333 Mammographic heterogeneous density, bilateral breasts: Secondary | ICD-10-CM | POA: Diagnosis not present

## 2023-06-02 DIAGNOSIS — Z9189 Other specified personal risk factors, not elsewhere classified: Secondary | ICD-10-CM | POA: Diagnosis not present

## 2023-06-02 DIAGNOSIS — N6310 Unspecified lump in the right breast, unspecified quadrant: Secondary | ICD-10-CM | POA: Diagnosis not present

## 2023-06-10 ENCOUNTER — Telehealth (INDEPENDENT_AMBULATORY_CARE_PROVIDER_SITE_OTHER): Payer: Medicare HMO | Admitting: Nurse Practitioner

## 2023-06-10 ENCOUNTER — Encounter: Payer: Self-pay | Admitting: Nurse Practitioner

## 2023-06-10 ENCOUNTER — Other Ambulatory Visit: Payer: Self-pay | Admitting: Family

## 2023-06-10 DIAGNOSIS — K219 Gastro-esophageal reflux disease without esophagitis: Secondary | ICD-10-CM

## 2023-06-10 DIAGNOSIS — J069 Acute upper respiratory infection, unspecified: Secondary | ICD-10-CM | POA: Diagnosis not present

## 2023-06-10 MED ORDER — FLUTICASONE PROPIONATE 50 MCG/ACT NA SUSP: 2.0000 | Freq: Every day | NASAL | 6 refills | Status: DC

## 2023-06-10 MED ORDER — PREDNISONE 20 MG PO TABS: 40.0000 mg | ORAL_TABLET | Freq: Every day | ORAL | 0 refills | Status: AC

## 2023-06-10 NOTE — Patient Instructions (Signed)
Ruth Batten, thank you for joining Ruth Pierini, FNP for today's virtual visit.  While this provider is not your primary care provider (PCP), if your PCP is located in our provider database this encounter information will be shared with them immediately following your visit.   [facility] [place] MyChart account gives you access to today's visit and all your visits, tests, and labs performed at [facility] " click here if you don't have [facility] [place] MyChart account or go to [email or web address]  Consent: (Patient) Ruth [facility] provided verbal consent for this virtual visit at the beginning of the encounter.  Current Medications:  Current Outpatient Medications:    fluticasone (FLONASE) 50 MCG/ACT nasal spray, Place 2 sprays into both nostrils daily., Disp: 16 g, Rfl: 6   acetaminophen (TYLENOL) 500 MG tablet, Take by mouth., Disp: , Rfl:    clonazePAM (KLONOPIN) 0.5 MG tablet, Take 1 tablet (0.5 mg total) by mouth 2 (two) times daily., Disp: 30 tablet, Rfl: 2   cycloSPORINE (RESTASIS) 0.05 % ophthalmic emulsion, Restasis 0.05 % eye drops in [facility] dropperette  USE 1 DROP INTO BOTH EYES TWICE [facility] DAY, Disp: , Rfl:    eletriptan (RELPAX) 40 MG tablet, TAKE 1 TABLET BY MOUTH EVERY 2 HOURS AS NEEDED FOR MIGRAINE HEADACHE . DO NOT EXCEED 6 PER 24 HOURS, Disp: 12 tablet, Rfl: 0   estradiol (VIVELLE-DOT) 0.075 MG/24HR, Place 1 patch onto the skin 2 (two) times [facility] week., Disp: , Rfl:    finasteride (PROSCAR) 5 MG tablet, Take by mouth., Disp: , Rfl:    Fluocinolone Acetonide (FLAC) 0.01 % OIL, Place 1 drop in ear(s) daily. Place 1-2 drop in right ear, Disp: 20 mL, Rfl: 0   hydrocortisone (ANUSOL-HC) 25 MG suppository, Place 1 suppository (25 mg total) rectally 2 (two) times daily., Disp: 12 suppository, Rfl: 0   imipramine (TOFRANIL) 10 MG tablet, Take 3 tablets (30 mg total) by mouth at bedtime., Disp: 90 tablet, Rfl: 11   levothyroxine (SYNTHROID) 50 MCG tablet, TAKE 1 TABLET BY MOUTH  ONCE DAILY BEFORE BREAKFAST, Disp: 90 tablet, Rfl: 1   meclizine (ANTIVERT) 50 MG tablet, Take 1 tablet (50 mg total) by mouth 3 (three) times daily as needed., Disp: 30 tablet, Rfl: 0   methocarbamol (ROBAXIN) 500 MG tablet, Take 1 tablet (500 mg total) by mouth every 8 (eight) hours as needed for muscle spasms., Disp: 90 tablet, Rfl: 1   midodrine (PROAMATINE) 2.5 MG tablet, Take 1 tablet (2.5 mg total) by mouth 3 (three) times daily with meals. (Patient not taking: Reported on 04/13/2023), Disp: 90 tablet, Rfl: 1   mometasone (NASONEX) 50 MCG/ACT nasal spray, PLACE 2 SPRAYS INTO THE NOSE DAILY., Disp: 51 each, Rfl: 1   Multiple Vitamins-Minerals (MULTIVITAMIN PO), Take by mouth., Disp: , Rfl:    omeprazole (PRILOSEC) 20 MG capsule, Take 1 capsule (20 mg total) by mouth daily., Disp: 90 capsule, Rfl: 1   ondansetron (ZOFRAN) 4 MG tablet, Take 1 tablet (4 mg total) by mouth every 8 (eight) hours as needed for nausea or vomiting., Disp: 20 tablet, Rfl: 0   pentosan polysulfate (ELMIRON) 100 MG capsule, Take 1 capsule (100 mg total) by mouth 3 (three) times daily., Disp: 90 capsule, Rfl: 1   tacrolimus (PROTOPIC) 0.1 % ointment, tacrolimus 0.1 % topical ointment  APPLY OINTMENT TOPICALLY TO AFFECTED AREA TWICE DAILY, Disp: , Rfl:    topiramate (TOPAMAX) 25 MG tablet, Take 1 tablet (25 mg total) by mouth 2 (two) times  daily., Disp: 60 tablet, Rfl: 1   triamcinolone ointment (KENALOG) 0.5 %, Apply 1 Application topically 2 (two) times daily., Disp: 60 g, Rfl: 0   valACYclovir (VALTREX) 500 MG tablet, Take 500 mg by mouth daily as needed., Disp: , Rfl:    Medications ordered in this encounter:  Meds ordered this encounter  Medications   fluticasone (FLONASE) 50 MCG/ACT nasal spray    Sig: Place 2 sprays into both nostrils daily.    Dispense:  16 g    Refill:  6    Order Specific Question:   Supervising Provider    Answer:   Ruth [facility] [[id number]]     *If you need refills on other  medications prior to your next appointment, please contact your pharmacy*  Follow-Up: Call back or seek an in-person evaluation if the symptoms worsen or if the condition fails to improve as anticipated.  [facility] [phone]  Other Instructions 1. Take meds as prescribed 2. Use [facility] cool mist humidifier especially during the winter months and when heat has been humid. 3. Use saline nose sprays frequently 4. Saline irrigations of the nose can be very helpful if done frequently.  * 4X daily for 1 week*  * Use of [facility] nettie pot can be helpful with this. Follow directions with this* 5. Drink plenty of fluids 6. Keep thermostat turn down low 7.For any cough or congestion- mucinex 8. For fever or aces or pains- take tylenol or ibuprofen appropriate for age and weight.  * for fevers greater than 101 orally you may alternate ibuprofen and tylenol every  3 hours.       If you have been instructed to have an in-person evaluation today at [facility] local [facility] facility, please use the link below. It will take you to [facility] list of all of our available [place] [facility], including address, phone number and hours of operation. Please do not delay care.  [facility]  If you or [facility] family member do not have [facility] primary care provider, use the link below to schedule [facility] visit and establish care. When you choose [facility] [place] primary care physician or advanced practice provider, you gain [facility] long-term partner in health. Find [facility] Primary Care Provider  Learn more about [facility] in-office and virtual care options: [facility] - Get Care Now

## 2023-06-10 NOTE — Progress Notes (Signed)
Virtual Visit Consent   Ruth Hopkins, you are scheduled for a virtual visit with Ruth Daphine Deutscher, FNP, a [facility] provider, today.     Just as with appointments in the office, your consent must be obtained to participate.  Your consent will be active for this visit and any virtual visit you may have with one of our providers in the next 365 days.     If you have a MyChart account, a copy of this consent can be sent to you electronically.  All virtual visits are billed to your insurance company just like a traditional visit in the office.    As this is a virtual visit, video technology does not allow for your provider to perform a traditional examination.  This may limit your provider's ability to fully assess your condition.  If your provider identifies any concerns that need to be evaluated in person or the need to arrange testing (such as labs, EKG, etc.), we will make arrangements to do so.     Although advances in technology are sophisticated, we cannot ensure that it will always work on either your end or our end.  If the connection with a video visit is poor, the visit may have to be switched to a telephone visit.  With either a video or telephone visit, we are not always able to ensure that we have a secure connection.     I need to obtain your verbal consent now.   Are you willing to proceed with your visit today? YES   ALITHEA AVILA has provided verbal consent on 06/10/2023 for a virtual visit (video or telephone).   Ruth Daphine Deutscher, FNP   Date: 06/10/2023 9:50 AM   Virtual Visit via Video Note   I, Ruth Hopkins, connected with Ruth Hopkins ([id number], May 28, 1966) on 06/10/23 at  5:15 PM EDT by a video-enabled telemedicine application and verified that I am speaking with the correct person using two identifiers.  Location: Patient: Virtual Visit Location Patient: Home Provider: Virtual Visit Location Provider: Mobile   I discussed the limitations of  evaluation and management by telemedicine and the availability of in person appointments. The patient expressed understanding and agreed to proceed.    History of Present Illness: Ruth Hopkins is a 57 y.o. who identifies as a female who was assigned female at birth, and is being seen today for uri.  HPI: URI  This is a new problem. The current episode started in the past 7 days. The problem has been waxing and waning. Associated symptoms include congestion, coughing, headaches, rhinorrhea and a sore throat. Treatments tried: mucinex. The treatment provided mild relief.  Covid test negative  Review of Systems  HENT:  Positive for congestion, rhinorrhea and sore throat.   Respiratory:  Positive for cough.   Neurological:  Positive for headaches.    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   Depression, recurrent (HCC) 05/26/2018   History of adenomatous polyp of colon 12/25/2017   Internal hemorrhoids 12/25/2017   Dense breast tissue on mammogram 07/18/2017   Acquired hypothyroidism  12/10/2016   At high risk for breast cancer 09/30/2016   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   Fatigue 01/10/2014   Irritable bowel syndrome with constipation 01/10/2014   Painful lumpy right breast 09/05/2013   Smoker 07/09/2013   Fibromyalgia 05/24/2013   Migraine with status migrainosus 05/24/2013   Morton's neuroma of right foot 05/24/2013   Pain in joint involving lower leg 05/24/2013   Lesion of plantar nerve 05/24/2013   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     Allergies:  Allergies  Allergen Reactions   Latex Itching and Rash   Codeine    Gabapentin     "fuzzy"   Sulfur Itching   Sulfa Antibiotics Hives, Itching and Rash   Medications:  Current Outpatient Medications:    acetaminophen (TYLENOL) 500 MG tablet, Take by mouth., Disp: , Rfl:    clonazePAM (KLONOPIN) 0.5 MG tablet, Take 1 tablet (0.5 mg total) by mouth 2 (two) times daily., Disp: 30 tablet, Rfl: 2   cycloSPORINE (RESTASIS) 0.05 % ophthalmic emulsion, Restasis 0.05 % eye drops in a dropperette  USE 1 DROP INTO BOTH EYES TWICE A DAY, Disp: , Rfl:    eletriptan (RELPAX) 40 MG tablet, TAKE 1 TABLET BY MOUTH EVERY 2 HOURS AS NEEDED FOR MIGRAINE HEADACHE . DO NOT EXCEED 6 PER 24 HOURS, Disp: 12 tablet, Rfl: 0   estradiol (VIVELLE-DOT) 0.075 MG/24HR, Place 1 patch onto the skin 2 (two) times a week., Disp: , Rfl:    finasteride (PROSCAR) 5 MG tablet, Take by mouth., Disp: , Rfl:    Fluocinolone Acetonide (FLAC) 0.01 % OIL, Place 1 drop in ear(s) daily. Place 1-2 drop in right ear, Disp: 20 mL, Rfl: 0   hydrocortisone (ANUSOL-HC) 25 MG suppository, Place 1 suppository (25 mg total) rectally 2 (two) times daily., Disp: 12 suppository, Rfl: 0   imipramine (TOFRANIL) 10 MG tablet, Take 3 tablets (30 mg total) by mouth at bedtime., Disp: 90 tablet, Rfl: 11   levothyroxine (SYNTHROID) 50 MCG tablet, TAKE 1 TABLET BY MOUTH ONCE DAILY BEFORE BREAKFAST, Disp: 90 tablet, Rfl: 1   meclizine (ANTIVERT) 50 MG tablet, Take 1 tablet (50 mg total) by mouth 3 (three) times daily as needed., Disp: 30 tablet, Rfl: 0   methocarbamol (ROBAXIN) 500 MG tablet, Take 1 tablet (500 mg total) by mouth every 8 (eight) hours as needed for muscle spasms., Disp: 90 tablet, Rfl: 1   midodrine (PROAMATINE) 2.5 MG tablet, Take 1 tablet (2.5 mg total) by mouth 3 (three) times daily with meals. (Patient not taking: Reported on 04/13/2023), Disp: 90 tablet, Rfl: 1   mometasone (NASONEX) 50 MCG/ACT nasal spray, PLACE 2 SPRAYS  INTO THE NOSE DAILY., Disp: 51 each, Rfl: 1   Multiple Vitamins-Minerals (MULTIVITAMIN PO), Take by mouth., Disp: , Rfl:    omeprazole (PRILOSEC) 20 MG capsule, Take 1 capsule (20 mg total) by mouth daily., Disp: 90 capsule, Rfl: 1   ondansetron (ZOFRAN) 4 MG tablet, Take 1 tablet (4 mg total) by mouth every 8 (eight) hours as needed for nausea or vomiting., Disp: 20 tablet, Rfl: 0   pentosan polysulfate (ELMIRON) 100 MG capsule, Take 1 capsule (100 mg total) by mouth 3 (three) times daily., Disp: 90 capsule, Rfl: 1   tacrolimus (PROTOPIC) 0.1 % ointment, tacrolimus 0.1 % topical ointment  APPLY OINTMENT TOPICALLY TO AFFECTED AREA TWICE DAILY, Disp: , Rfl:    topiramate (TOPAMAX) 25 MG tablet, Take 1 tablet (25 mg total) by mouth 2 (two) times  daily., Disp: 60 tablet, Rfl: 1   triamcinolone ointment (KENALOG) 0.5 %, Apply 1 Application topically 2 (two) times daily., Disp: 60 g, Rfl: 0   valACYclovir (VALTREX) 500 MG tablet, Take 500 mg by mouth daily as needed., Disp: , Rfl:   Observations/Objective: Patient is well-developed, well-nourished in no acute distress.  Resting comfortably  at home.  Head is normocephalic, atraumatic.  No labored breathing.  Speech is clear and coherent with logical content.  Patient is alert and oriented at baseline.  No cough during visit  Assessment and Plan:  EMIYA NORCUTT in today with chief complaint of URI   1. Viral URI with cough 1. Take meds as prescribed 2. Use a cool mist humidifier especially during the winter months and when heat has been humid. 3. Use saline nose sprays frequently 4. Saline irrigations of the nose can be very helpful if done frequently.  * 4X daily for 1 week*  * Use of a nettie pot can be helpful with this. Follow directions with this* 5. Drink plenty of fluids 6. Keep thermostat turn down low 7.For any cough or congestion- mucinex 8. For fever or aces or pains- take tylenol or ibuprofen appropriate for age and weight.  *  for fevers greater than 101 orally you may alternate ibuprofen and tylenol every  3 hours.    Meds ordered this encounter  Medications   fluticasone (FLONASE) 50 MCG/ACT nasal spray    Sig: Place 2 sprays into both nostrils daily.    Dispense:  16 g    Refill:  6    Order Specific Question:   Supervising Provider    Answer:   Arville Care A [[id number]]   predniSONE (DELTASONE) 20 MG tablet    Sig: Take 2 tablets (40 mg total) by mouth daily with breakfast for 5 days. 2 po daily for 5 days    Dispense:  10 tablet    Refill:  0    Order Specific Question:   Supervising Provider    Answer:   Nils Pyle [id number]   Patient insisted on steroid   Follow Up Instructions: I discussed the assessment and treatment plan with the patient. The patient was provided an opportunity to ask questions and all were answered. The patient agreed with the plan and demonstrated an understanding of the instructions.  A copy of instructions were sent to the patient via MyChart.  The patient was advised to call back or seek an in-person evaluation if the symptoms worsen or if the condition fails to improve as anticipated.  Time:  I spent 8 minutes with the patient via telehealth technology discussing the above problems/concerns.    Ruth Daphine Deutscher, FNP

## 2023-06-13 DIAGNOSIS — N6310 Unspecified lump in the right breast, unspecified quadrant: Secondary | ICD-10-CM | POA: Diagnosis not present

## 2023-06-13 DIAGNOSIS — Z9189 Other specified personal risk factors, not elsewhere classified: Secondary | ICD-10-CM | POA: Diagnosis not present

## 2023-06-13 DIAGNOSIS — N6315 Unspecified lump in the right breast, overlapping quadrants: Secondary | ICD-10-CM | POA: Diagnosis not present

## 2023-06-13 DIAGNOSIS — Z86000 Personal history of in-situ neoplasm of breast: Secondary | ICD-10-CM | POA: Diagnosis not present

## 2023-06-13 DIAGNOSIS — R92333 Mammographic heterogeneous density, bilateral breasts: Secondary | ICD-10-CM | POA: Diagnosis not present

## 2023-06-13 DIAGNOSIS — R928 Other abnormal and inconclusive findings on diagnostic imaging of breast: Secondary | ICD-10-CM | POA: Diagnosis not present

## 2023-06-22 ENCOUNTER — Other Ambulatory Visit: Payer: Self-pay | Admitting: Family

## 2023-06-22 DIAGNOSIS — G43901 Migraine, unspecified, not intractable, with status migrainosus: Secondary | ICD-10-CM

## 2023-06-29 DIAGNOSIS — Z5181 Encounter for therapeutic drug level monitoring: Secondary | ICD-10-CM | POA: Diagnosis not present

## 2023-06-29 DIAGNOSIS — F330 Major depressive disorder, recurrent, mild: Secondary | ICD-10-CM | POA: Diagnosis not present

## 2023-06-29 DIAGNOSIS — F1321 Sedative, hypnotic or anxiolytic dependence, in remission: Secondary | ICD-10-CM | POA: Diagnosis not present

## 2023-06-29 DIAGNOSIS — F331 Major depressive disorder, recurrent, moderate: Secondary | ICD-10-CM | POA: Diagnosis not present

## 2023-07-06 ENCOUNTER — Ambulatory Visit: Payer: Medicare HMO | Admitting: Podiatry

## 2023-07-06 ENCOUNTER — Encounter: Payer: Self-pay | Admitting: Podiatry

## 2023-07-06 DIAGNOSIS — M7751 Other enthesopathy of right foot: Secondary | ICD-10-CM

## 2023-07-06 DIAGNOSIS — M7752 Other enthesopathy of left foot: Secondary | ICD-10-CM

## 2023-07-06 DIAGNOSIS — L600 Ingrowing nail: Secondary | ICD-10-CM | POA: Diagnosis not present

## 2023-07-06 MED ORDER — TRIAMCINOLONE ACETONIDE 10 MG/ML IJ SUSP
10.0000 mg | Freq: Once | INTRAMUSCULAR | Status: AC
Start: 2023-07-06 — End: 2023-07-06
  Administered 2023-07-06: 10 mg via INTRA_ARTICULAR

## 2023-07-06 NOTE — Progress Notes (Signed)
Subjective:   Patient ID: Ruth Hopkins, female   DOB: 57 y.o.   MRN: [id number]   HPI Patient presents with painful joints of both feet and also has complaints of an ingrown toenail on the second toe that is painful and she has been trying to soak and tried   ROS      Objective:  Physical Exam  Neurovascular status intact inflammation pain of the sinus tarsi of both feet with fluid buildup in the sinus tarsi and also an incurvated second nail left lateral border moderate pain mild swelling     Assessment:  Acute capsulitis of the sinus tarsi bilateral with patient not been treated in over a year and a half with chronic acute ingrown toenail deformity second left lateral border     Plan:  H&P reviewed both conditions.  For the acute element of the nail I have recommended soaks and cushioning which I dispensed today and I gave instructions for Epsom salt soaks and the possibility of nail removal which I educated her on.  I went ahead for the chronic sinus tarsitis did sterile prep injected the sinus tarsi bilateral 3 mg Dexasone Kenalog 5 mg Xylocaine applied sterile dressings

## 2023-07-14 ENCOUNTER — Ambulatory Visit: Payer: Medicare HMO | Admitting: Family

## 2023-07-15 DIAGNOSIS — K6389 Other specified diseases of intestine: Secondary | ICD-10-CM | POA: Diagnosis not present

## 2023-07-15 DIAGNOSIS — K5010 Crohn's disease of large intestine without complications: Secondary | ICD-10-CM | POA: Diagnosis not present

## 2023-07-18 ENCOUNTER — Ambulatory Visit (INDEPENDENT_AMBULATORY_CARE_PROVIDER_SITE_OTHER): Payer: Medicare HMO | Admitting: Family

## 2023-07-18 ENCOUNTER — Encounter: Payer: Self-pay | Admitting: Family

## 2023-07-18 VITALS — BP 101/69 | HR 83 | Temp 97.6°F | Ht 64.0 in | Wt 127.2 lb

## 2023-07-18 DIAGNOSIS — F339 Major depressive disorder, recurrent, unspecified: Secondary | ICD-10-CM

## 2023-07-18 DIAGNOSIS — K581 Irritable bowel syndrome with constipation: Secondary | ICD-10-CM | POA: Diagnosis not present

## 2023-07-18 DIAGNOSIS — F419 Anxiety disorder, unspecified: Secondary | ICD-10-CM

## 2023-07-18 DIAGNOSIS — I950 Idiopathic hypotension: Secondary | ICD-10-CM

## 2023-07-18 DIAGNOSIS — E785 Hyperlipidemia, unspecified: Secondary | ICD-10-CM | POA: Diagnosis not present

## 2023-07-18 DIAGNOSIS — M545 Low back pain, unspecified: Secondary | ICD-10-CM | POA: Diagnosis not present

## 2023-07-18 DIAGNOSIS — Z23 Encounter for immunization: Secondary | ICD-10-CM

## 2023-07-18 DIAGNOSIS — F17200 Nicotine dependence, unspecified, uncomplicated: Secondary | ICD-10-CM

## 2023-07-18 DIAGNOSIS — G43901 Migraine, unspecified, not intractable, with status migrainosus: Secondary | ICD-10-CM | POA: Diagnosis not present

## 2023-07-18 DIAGNOSIS — Z0000 Encounter for general adult medical examination without abnormal findings: Secondary | ICD-10-CM

## 2023-07-18 DIAGNOSIS — E039 Hypothyroidism, unspecified: Secondary | ICD-10-CM

## 2023-07-18 DIAGNOSIS — K219 Gastro-esophageal reflux disease without esophagitis: Secondary | ICD-10-CM | POA: Diagnosis not present

## 2023-07-18 DIAGNOSIS — M79604 Pain in right leg: Secondary | ICD-10-CM | POA: Diagnosis not present

## 2023-07-18 DIAGNOSIS — M797 Fibromyalgia: Secondary | ICD-10-CM | POA: Diagnosis not present

## 2023-07-18 DIAGNOSIS — Z0001 Encounter for general adult medical examination with abnormal findings: Secondary | ICD-10-CM

## 2023-07-18 NOTE — Progress Notes (Signed)
Subjective:    Patient ID: Ruth Hopkins, female    DOB: Feb 02, 1966, 57 y.o.   MRN: [id number]  Chief Complaint  Patient presents with   Medical Management of Chronic Issues   PT presents to the office today for CPE and chronic follow up.    She is followed by psychiatrist 3 months for hot flashes and GAD.She is currently taking estradiol patch and gradually decreasing and trying to add other medications. She has tried gabapentin, Lexapro, and Effexor, and wellbutrin without success.   She is complaining of hair loss.    She has fibromyalgia and states she gets neck pain that causes her migraines. She is followed by GYN annually   She is followed by GI she had repeat colonoscopy 07/15/23 with biopsy.    She has hypotension and saw Cardiologists. She was started on Midodrine, but has not started this.  Migraine  This is a chronic problem. The current episode started more than 1 year ago. Episode frequency: 12 times a month. The problem has been unchanged. The pain is located in the Occipital and frontal region. The pain quality is similar to prior headaches. Associated symptoms include back pain. The symptoms are aggravated by emotional stress. She has tried triptans for the symptoms. The treatment provided mild relief.  Gastroesophageal Reflux She complains of belching and heartburn. This is a chronic problem. The current episode started more than 1 year ago. The problem occurs frequently. The symptoms are aggravated by smoking. Associated symptoms include fatigue. She has tried a PPI for the symptoms. The treatment provided moderate relief.  Constipation This is a chronic problem. The current episode started more than 1 year ago. The problem has been resolved since onset. Her stool frequency is 2 to 3 times per week. Associated symptoms include back pain. The treatment provided moderate relief.  Thyroid Problem Presents for follow-up visit. Symptoms include anxiety, constipation, fatigue  and hair loss. The symptoms have been stable. Her past medical history is significant for hyperlipidemia.  Hyperlipidemia This is a chronic problem. The current episode started more than 1 year ago. Current antihyperlipidemic treatment includes diet change. The current treatment provides mild improvement of lipids. Risk factors for coronary artery disease include dyslipidemia, hypertension and a sedentary lifestyle.  Back Pain This is a chronic problem. The current episode started more than 1 year ago. The problem occurs intermittently. The problem has been waxing and waning since onset. The pain is present in the lumbar spine. The quality of the pain is described as aching. The pain is at a severity of 5/10. The pain is moderate. She has tried muscle relaxant for the symptoms. The treatment provided moderate relief.  Depression        This is a chronic problem.  The current episode started more than 1 year ago.   The problem occurs intermittently.  Associated symptoms include fatigue, helplessness, hopelessness, restlessness and sad.  Past medical history includes thyroid problem and anxiety.   Anxiety Presents for follow-up visit. Symptoms include excessive worry, nervous/anxious behavior and restlessness. Symptoms occur most days. The severity of symptoms is mild.        Review of Systems  Constitutional:  Positive for fatigue.  Gastrointestinal:  Positive for constipation and heartburn.  Musculoskeletal:  Positive for back pain.  Psychiatric/Behavioral:  Positive for depression. The patient is nervous/anxious.   All other systems reviewed and are negative.  Family History  Problem Relation Age of Onset   Asthma Mother  Hypothyroidism Mother    Hypertension Mother    Raynaud syndrome Mother    Hypertension Father    Social History   Socioeconomic History   Marital status: Married    Spouse name: Not on file   Number of children: 0   Years of education: Not on file   Highest  education level: Not on file  Occupational History   Occupation: disability  Tobacco Use   Smoking status: Former    Current packs/day: 0.00    Average packs/day: 1 pack/day for 35.0 years (35.0 ttl pk-yrs)    Types: Cigarettes    Start date: 11/03/1987    Quit date: 11/02/2022    Years since quitting: 0.7   Smokeless tobacco: Never   Tobacco comments:    Smoking since age 88 - 1ppd usually - has tried to quit several times - is afraid it will make her gain weight    Started smoking again a few weeks ago, maybe 15 ciggs per day --12/16/2022  Vaping Use   Vaping status: Never Used  Substance and Sexual Activity   Alcohol use: No   Drug use: No   Sexual activity: Not on file  Other Topics Concern   Not on file  Social History Narrative   Lives home with husband in one level home   Her mother and sister live nearby   [employer]   Social Determinants of Health   Financial Resource Strain: Low Risk  (04/13/2023)   Overall Financial Resource Strain (CARDIA)    Difficulty of Paying Living Expenses: Not hard at all  Food Insecurity: No Food Insecurity (04/13/2023)   Hunger Vital Sign    Worried About Running Out of Food in the Last Year: Never true    Ran Out of Food in the Last Year: Never true  Transportation Needs: No Transportation Needs (04/13/2023)   PRAPARE - Administrator, Civil Service (Medical): No    Lack of Transportation (Non-Medical): No  Physical Activity: Inactive (04/13/2023)   Exercise Vital Sign    Days of Exercise per Week: 0 days    Minutes of Exercise per Session: 0 min  Stress: No Stress Concern Present (04/13/2023)   [employer] of Occupational Health - Occupational Stress Questionnaire    Feeling of Stress : Not at all  Social Connections: Moderately Integrated (04/13/2023)   Social Connection and Isolation Panel [NHANES]    Frequency of Communication with Friends and Family: More than three times a week    Frequency of Social  Gatherings with Friends and Family: More than three times a week    Attends Religious Services: More than 4 times per year    Active Member of [employer] or Organizations: No    Attends Banker Meetings: Never    Marital Status: Married       Objective:   Physical Exam Vitals reviewed.  Constitutional:      General: She is not in acute distress.    Appearance: She is well-developed.  HENT:     Head: Normocephalic and atraumatic.     Right Ear: Tympanic membrane normal.     Left Ear: Tympanic membrane normal.  Eyes:     Pupils: Pupils are equal, round, and reactive to light.  Neck:     Thyroid: No thyromegaly.  Cardiovascular:     Rate and Rhythm: Normal rate and regular rhythm.     Heart sounds: Normal heart sounds. No murmur heard. Pulmonary:  Effort: Pulmonary effort is normal. No respiratory distress.     Breath sounds: Normal breath sounds. No wheezing.  Abdominal:     General: Bowel sounds are normal. There is no distension.     Palpations: Abdomen is soft.     Tenderness: There is no abdominal tenderness.  Musculoskeletal:        General: No tenderness. Normal range of motion.     Cervical back: Normal range of motion and neck supple.  Skin:    General: Skin is warm and dry.  Neurological:     Mental Status: She is alert and oriented to person, place, and time.     Cranial Nerves: No cranial nerve deficit.     Deep Tendon Reflexes: Reflexes are normal and symmetric.  Psychiatric:        Behavior: Behavior normal.        Thought Content: Thought content normal.        Judgment: Judgment normal.       BP 101/69   Pulse 83   Temp 97.6 F (36.4 C) (Temporal)   Ht 5\' 4"  (1.626 m)   Wt 127 lb 3.2 oz (57.7 kg)   SpO2 98%   BMI 21.83 kg/m      Assessment & Plan:   ELLISA FIERS comes in today with chief complaint of Medical Management of Chronic Issues   Diagnosis and orders addressed:  1. Migraine with status migrainosus, not intractable,  unspecified migraine type - CMP14+EGFR  2. Lumbar pain with radiation down right leg - CMP14+EGFR  3. Smoker - CMP14+EGFR  4. Irritable bowel syndrome with constipation - CMP14+EGFR  5. Idiopathic hypotension - CMP14+EGFR  6. Hyperlipidemia, unspecified hyperlipidemia type - CMP14+EGFR - Lipid panel  7. Gastroesophageal reflux disease, unspecified whether esophagitis present - CMP14+EGFR  8. Fibromyalgia - CMP14+EGFR  9. Depression, recurrent (HCC)  - CMP14+EGFR  10. Anxiety - CMP14+EGFR  11. Acquired hypothyroidism  - CMP14+EGFR - TSH  12. Encounter for immunization - Flu vaccine trivalent PF, 6mos and older(Flulaval,Afluria,Fluarix,Fluzone) - CMP14+EGFR  13. Annual physical exam - CMP14+EGFR - CBC with Differential/Platelet - Lipid panel   Labs pending Continue current medications  Keep follow up with specialist  Health Maintenance reviewed Diet and exercise encouraged  Follow up plan: 6 months   Jannifer Rodney, FNP

## 2023-07-18 NOTE — Patient Instructions (Signed)

## 2023-07-19 LAB — CMP14+EGFR
ALT: 17 [IU]/L (ref 0–32)
AST: 19 [IU]/L (ref 0–40)
Albumin: 4.3 g/dL (ref 3.8–4.9)
Alkaline Phosphatase: 67 [IU]/L (ref 44–121)
BUN/Creatinine Ratio: 38 — ABNORMAL HIGH (ref 9–23)
BUN: 28 mg/dL — ABNORMAL HIGH (ref 6–24)
Bilirubin Total: <0.2 mg/dL (ref 0.0–1.2)
CO2: 25 mmol/L (ref 20–29)
Calcium: 9.3 mg/dL (ref 8.7–10.2)
Chloride: 102 mmol/L (ref 96–106)
Creatinine, Ser: 0.74 mg/dL (ref 0.57–1.00)
Globulin, Total: 2.6 g/dL (ref 1.5–4.5)
Glucose: 72 mg/dL (ref 70–99)
Potassium: 4.2 mmol/L (ref 3.5–5.2)
Sodium: 142 mmol/L (ref 134–144)
Total Protein: 6.9 g/dL (ref 6.0–8.5)
eGFR: 94 mL/min/{1.73_m2} (ref >59)

## 2023-07-19 LAB — CBC WITH DIFFERENTIAL/PLATELET
Basophils Absolute: 0.1 10*3/uL (ref 0.0–0.2)
Basos: 1 %
EOS (ABSOLUTE): 0.2 10*3/uL (ref 0.0–0.4)
Eos: 4 %
Hematocrit: 44.4 % (ref 34.0–46.6)
Hemoglobin: 14.2 g/dL (ref 11.1–15.9)
Immature Grans (Abs): 0 10*3/uL (ref 0.0–0.1)
Immature Granulocytes: 0 %
Lymphocytes Absolute: 2.2 10*3/uL (ref 0.7–3.1)
Lymphs: 35 %
MCH: 30 pg (ref 26.6–33.0)
MCHC: 32 g/dL (ref 31.5–35.7)
MCV: 94 fL (ref 79–97)
Monocytes Absolute: 0.4 10*3/uL (ref 0.1–0.9)
Monocytes: 6 %
Neutrophils Absolute: 3.4 10*3/uL (ref 1.4–7.0)
Neutrophils: 54 %
Platelets: 191 10*3/uL (ref 150–450)
RBC: 4.74 x10E6/uL (ref 3.77–5.28)
RDW: 11.5 % — ABNORMAL LOW (ref 11.7–15.4)
WBC: 6.2 10*3/uL (ref 3.4–10.8)

## 2023-07-19 LAB — LIPID PANEL
Chol/HDL Ratio: 2.9 ratio (ref 0.0–4.4)
Cholesterol, Total: 210 mg/dL — ABNORMAL HIGH (ref 100–199)
HDL: 72 mg/dL (ref >39)
LDL Chol Calc (NIH): 125 mg/dL — ABNORMAL HIGH (ref 0–99)
Triglycerides: 71 mg/dL (ref 0–149)
VLDL Cholesterol Cal: 13 mg/dL (ref 5–40)

## 2023-07-19 LAB — TSH: TSH: 2.27 u[IU]/mL (ref 0.450–4.500)

## 2023-07-20 ENCOUNTER — Ambulatory Visit: Payer: Medicare HMO | Admitting: Acute Care

## 2023-07-20 ENCOUNTER — Other Ambulatory Visit: Payer: Self-pay | Admitting: Emergency Medicine

## 2023-07-20 ENCOUNTER — Encounter: Payer: Self-pay | Admitting: Acute Care

## 2023-07-20 DIAGNOSIS — Z122 Encounter for screening for malignant neoplasm of respiratory organs: Secondary | ICD-10-CM

## 2023-07-20 DIAGNOSIS — Z87891 Personal history of nicotine dependence: Secondary | ICD-10-CM

## 2023-07-20 DIAGNOSIS — F17210 Nicotine dependence, cigarettes, uncomplicated: Secondary | ICD-10-CM

## 2023-07-20 NOTE — Patient Instructions (Signed)

## 2023-07-20 NOTE — Progress Notes (Signed)
Virtual Visit via Telephone Note  I connected with Ruth Hopkins on 07/20/23 at 11:00 AM EDT by telephone and verified that I am speaking with the correct person using two identifiers.  Location: Patient:  At home Provider: [address] [place], [place], [address]    I discussed the limitations, risks, security and privacy concerns of performing an evaluation and management service by telephone and the availability of in person appointments. I also discussed with the patient that there may be a patient responsible charge related to this service. The patient expressed understanding and agreed to proceed.   Shared Decision Making Visit Lung Cancer Screening Program [id number])   Eligibility: Age 57 y.o. Pack Years Smoking History Calculation 45 pack year smoking history (# packs/per year x # years smoked) Recent History of coughing up blood  no Unexplained weight loss? no ( >Than 15 pounds within the last 6 months ) Prior History Lung / other cancer no (Diagnosis within the last 5 years already requiring surveillance chest CT Scans). Smoking Status Current Smoker Former Smokers: Years since quit:  NA  Quit Date:  NA  Visit Components: Discussion included one or more decision making aids. yes Discussion included risk/benefits of screening. yes Discussion included potential follow up diagnostic testing for abnormal scans. yes Discussion included meaning and risk of over diagnosis. yes Discussion included meaning and risk of False Positives. yes Discussion included meaning of total radiation exposure. yes  Counseling Included: Importance of adherence to annual lung cancer LDCT screening. yes Impact of comorbidities on ability to participate in the program. yes Ability and willingness to under diagnostic treatment. yes  Smoking Cessation Counseling: Current Smokers:  Discussed importance of smoking cessation. yes Information about tobacco cessation classes and interventions  provided to patient. yes Patient provided with "ticket" for LDCT Scan. yes Symptomatic Patient. no  Counseling NA Diagnosis Code: Tobacco Use Z72.0 Asymptomatic Patient yes  Counseling (Intermediate counseling: > three minutes counseling) T0160 Former Smokers:  Discussed the importance of maintaining cigarette abstinence. yes Diagnosis Code: Personal History of Nicotine Dependence. F09.323 Information about tobacco cessation classes and interventions provided to patient. Yes Patient provided with "ticket" for LDCT Scan. yes Written Order for Lung Cancer Screening with LDCT placed in Epic. Yes (CT Chest Lung Cancer Screening Low Dose W/O CM) [id number] Z12.2-Screening of respiratory organs Z87.891-Personal history of nicotine dependence     Bevelyn Ngo, NP 07/20/2023

## 2023-07-25 ENCOUNTER — Encounter: Payer: Self-pay | Admitting: Family

## 2023-07-25 ENCOUNTER — Ambulatory Visit (INDEPENDENT_AMBULATORY_CARE_PROVIDER_SITE_OTHER): Payer: Medicare HMO | Admitting: Family

## 2023-07-25 VITALS — BP 104/70 | HR 98 | Temp 97.9°F | Ht 64.0 in | Wt 127.0 lb

## 2023-07-25 DIAGNOSIS — U071 COVID-19: Secondary | ICD-10-CM

## 2023-07-25 DIAGNOSIS — R3989 Other symptoms and signs involving the genitourinary system: Secondary | ICD-10-CM | POA: Diagnosis not present

## 2023-07-25 LAB — URINALYSIS, COMPLETE
Bilirubin, UA: NEGATIVE
Glucose, UA: NEGATIVE
Ketones, UA: NEGATIVE
Leukocytes,UA: NEGATIVE
Nitrite, UA: NEGATIVE
Protein,UA: NEGATIVE
Specific Gravity, UA: 1.01 (ref 1.005–1.030)
Urobilinogen, Ur: 0.2 mg/dL (ref 0.2–1.0)
pH, UA: 6 (ref 5.0–7.5)

## 2023-07-25 LAB — MICROSCOPIC EXAMINATION
Bacteria, UA: NONE SEEN
Renal Epithel, UA: NONE SEEN /[HPF]
WBC, UA: NONE SEEN /[HPF] (ref 0–5)
Yeast, UA: NONE SEEN

## 2023-07-25 MED ORDER — NIRMATRELVIR/RITONAVIR (PAXLOVID)TABLET: 3.0000 | ORAL_TABLET | Freq: Two times a day (BID) | ORAL | 0 refills | Status: AC

## 2023-07-25 NOTE — Progress Notes (Signed)
Subjective:    Patient ID: Ruth Hopkins, female    DOB: 21-May-1966, 57 y.o.   MRN: [id number]  Chief Complaint  Patient presents with   Covid Positive    Fatigue snezzing   Urinary Tract Infection   PT presents to the office today with positive COVID. Reports her symptoms started Saturday and tested positive yesterday. Father currently has COVID and is hospitalized.  URI  This is a new problem. The current episode started in the past 7 days. The problem has been unchanged. There has been no fever. Associated symptoms include congestion, coughing, headaches, rhinorrhea, sinus pain, sneezing and a sore throat. Pertinent negatives include no ear pain, nausea or vomiting. She has tried increased fluids and acetaminophen for the symptoms. The treatment provided mild relief.  Urinary Frequency  This is a recurrent problem. The current episode started in the past 7 days. The problem occurs intermittently. The quality of the pain is described as burning. The pain is at a severity of 2/10. The pain is mild. Associated symptoms include frequency, hesitancy and urgency. Pertinent negatives include no hematuria, nausea or vomiting. She has tried increased fluids for the symptoms.      Review of Systems  HENT:  Positive for congestion, rhinorrhea, sinus pain, sneezing and sore throat. Negative for ear pain.   Respiratory:  Positive for cough.   Gastrointestinal:  Negative for nausea and vomiting.  Genitourinary:  Positive for frequency, hesitancy and urgency. Negative for hematuria.  Neurological:  Positive for headaches.  All other systems reviewed and are negative.      Objective:   Physical Exam Vitals reviewed.  Constitutional:      General: She is not in acute distress.    Appearance: She is well-developed.  HENT:     Head: Normocephalic and atraumatic.     Right Ear: Tympanic membrane normal.     Left Ear: Tympanic membrane normal.  Eyes:     Pupils: Pupils are equal, round, and  reactive to light.  Neck:     Thyroid: No thyromegaly.  Cardiovascular:     Rate and Rhythm: Normal rate and regular rhythm.     Heart sounds: Normal heart sounds. No murmur heard. Pulmonary:     Effort: Pulmonary effort is normal. No respiratory distress.     Breath sounds: Normal breath sounds. No wheezing.     Comments: Dry cough Abdominal:     General: Bowel sounds are normal. There is no distension.     Palpations: Abdomen is soft.     Tenderness: There is no abdominal tenderness.  Musculoskeletal:        General: No tenderness. Normal range of motion.     Cervical back: Normal range of motion and neck supple.  Skin:    General: Skin is warm and dry.  Neurological:     Mental Status: She is alert and oriented to person, place, and time.     Cranial Nerves: No cranial nerve deficit.     Deep Tendon Reflexes: Reflexes are normal and symmetric.  Psychiatric:        Behavior: Behavior normal.        Thought Content: Thought content normal.        Judgment: Judgment normal.      BP 104/70   Pulse 98   Temp 97.9 F (36.6 C) (Temporal)   Ht 5\' 4"  (1.626 m)   Wt 127 lb (57.6 kg)   SpO2 98%   BMI 21.80 kg/m  Assessment & Plan:  Ruth Hopkins comes in today with chief complaint of Covid Positive (Fatigue snezzing) and Urinary Tract Infection   Diagnosis and orders addressed:  1. Suspected UTI -Force fluids  - Urine Culture - Urinalysis, Complete  2. COVID-19 COVID positive, rest, force fluids, tylenol as needed, Quarantine for at least 5 days and you are fever free, then must wear a mask out in public from day 6-10, report any worsening symptoms such as increased shortness of breath, swelling, or continued high fevers. Possible adverse effects discussed with antivirals. - nirmatrelvir/ritonavir (PAXLOVID) 20 x 150 MG & 10 x 100MG  TABS; Take 3 tablets by mouth 2 (two) times daily for 5 days. (Take nirmatrelvir 150 mg two tablets twice daily for 5 days and  ritonavir 100 mg one tablet twice daily for 5 days) Patient GFR is 94  Dispense: 30 tablet; Refill: 0     Jannifer Rodney, FNP

## 2023-07-25 NOTE — Patient Instructions (Signed)

## 2023-07-27 ENCOUNTER — Encounter: Payer: Self-pay | Admitting: Family

## 2023-07-28 LAB — URINE CULTURE

## 2023-08-01 ENCOUNTER — Ambulatory Visit (HOSPITAL_COMMUNITY)
Admission: RE | Admit: 2023-08-01 | Discharge: 2023-08-01 | Disposition: A | Discharge status: Home / Self Care | Payer: Medicare HMO | Source: Ambulatory Visit | Attending: Acute Care | Admitting: Acute Care

## 2023-08-01 DIAGNOSIS — Z87891 Personal history of nicotine dependence: Secondary | ICD-10-CM | POA: Insufficient documentation

## 2023-08-01 DIAGNOSIS — Z122 Encounter for screening for malignant neoplasm of respiratory organs: Secondary | ICD-10-CM | POA: Diagnosis not present

## 2023-08-01 DIAGNOSIS — F17210 Nicotine dependence, cigarettes, uncomplicated: Secondary | ICD-10-CM | POA: Diagnosis not present

## 2023-08-03 DIAGNOSIS — Z01419 Encounter for gynecological examination (general) (routine) without abnormal findings: Secondary | ICD-10-CM | POA: Diagnosis not present

## 2023-08-03 DIAGNOSIS — Z6822 Body mass index (BMI) 22.0-22.9, adult: Secondary | ICD-10-CM | POA: Diagnosis not present

## 2023-08-06 ENCOUNTER — Other Ambulatory Visit: Payer: Self-pay | Admitting: Family

## 2023-08-06 DIAGNOSIS — G43901 Migraine, unspecified, not intractable, with status migrainosus: Secondary | ICD-10-CM

## 2023-08-08 ENCOUNTER — Ambulatory Visit (INDEPENDENT_AMBULATORY_CARE_PROVIDER_SITE_OTHER): Payer: Medicare HMO | Admitting: Nurse Practitioner

## 2023-08-08 ENCOUNTER — Telehealth: Payer: Self-pay | Admitting: Family Medicine

## 2023-08-08 ENCOUNTER — Other Ambulatory Visit: Payer: Self-pay | Admitting: Nurse Practitioner

## 2023-08-08 ENCOUNTER — Encounter: Payer: Self-pay | Admitting: Nurse Practitioner

## 2023-08-08 VITALS — BP 95/61 | HR 87 | Temp 97.9°F | Ht 64.0 in | Wt 127.6 lb

## 2023-08-08 DIAGNOSIS — R300 Dysuria: Secondary | ICD-10-CM | POA: Diagnosis not present

## 2023-08-08 DIAGNOSIS — N3001 Acute cystitis with hematuria: Secondary | ICD-10-CM | POA: Insufficient documentation

## 2023-08-08 LAB — URINALYSIS, ROUTINE W REFLEX MICROSCOPIC
Bilirubin, UA: NEGATIVE
Glucose, UA: NEGATIVE
Ketones, UA: NEGATIVE
Leukocytes,UA: NEGATIVE
Nitrite, UA: NEGATIVE
Protein,UA: NEGATIVE
Specific Gravity, UA: 1.01 (ref 1.005–1.030)
Urobilinogen, Ur: 0.2 mg/dL (ref 0.2–1.0)
pH, UA: 6.5 (ref 5.0–7.5)

## 2023-08-08 LAB — MICROSCOPIC EXAMINATION
Bacteria, UA: NONE SEEN
Epithelial Cells (non renal): NONE SEEN /[HPF] (ref 0–10)
RBC, Urine: NONE SEEN /[HPF] (ref 0–2)
Renal Epithel, UA: NONE SEEN /[HPF]
Yeast, UA: NONE SEEN

## 2023-08-08 MED ORDER — FLUCONAZOLE 150 MG PO TABS: 150.0000 mg | ORAL_TABLET | Freq: Every day | ORAL | 0 refills | Status: DC

## 2023-08-08 MED ORDER — CEPHALEXIN 500 MG PO CAPS: 500.0000 mg | ORAL_CAPSULE | Freq: Two times a day (BID) | ORAL | 0 refills | Status: DC

## 2023-08-08 NOTE — Progress Notes (Signed)
Acute Office Visit  Subjective:     Patient ID: EILYN MATO, female    DOB: November 12, 1965, 57 y.o.   MRN: [id number]  Chief Complaint  Patient presents with   Urinary Urgency    Symptoms for 3 days   Dysuria    HPI  BURNICE KURZAWA is a 57 y.o. female who complains of urinary frequency, urgency and dysuria x 3 days, frequency, burning, urgency without flank pain, fever, chills, or abnormal vaginal discharge or bleeding.  Reports similar abdominal past  with UTI.  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   Internal hemorrhoids 12/25/2017   Irritable bowel syndrome with constipation 01/10/2014   Pain in joint involving lower leg 05/24/2013   Painful lumpy right breast 09/05/2013   Smoker 07/09/2013   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   Depression, recurrent (HCC) 05/26/2018   Lumbar pain with radiation down right leg 11/09/2018   Right sciatic nerve pain 11/09/2018   Genital herpes simplex 10/12/2018   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   Menopause 10/21/2019   Anxiety 07/12/2019   Gastroesophageal reflux disease 07/24/2020   Lesion of plantar nerve 05/24/2013   Neck pain, acute 04/17/2021   Left ear pain 10/05/2021   Hyperlipidemia 01/19/2022   Hypotension 12/16/2022   Acute cystitis with hematuria 08/08/2023   Dysuria 08/08/2023   Resolved Ambulatory Problems    Diagnosis Date Noted   Right knee pain 05/24/2013   Encounter for smoking cessation counseling 03/09/2017   Migraine aura without headache 08/30/2016   Acute cystitis with  hematuria 08/16/2018   Educated about COVID-19 virus infection 09/02/2020   Viral upper respiratory tract infection 02/23/2021   Subacute frontal sinusitis 02/24/2021   Dysuria 04/17/2021   Past Medical History:  Diagnosis Date   Chronic fatigue    Depression    IBS (irritable bowel syndrome)    Migraine    Thyroid disease     Review of Systems  Constitutional:  Negative for chills and fever.  Respiratory:  Negative for cough and shortness of breath.   Gastrointestinal:  Negative for constipation, diarrhea, nausea and vomiting.  Genitourinary:  Positive for dysuria, frequency and urgency. Negative for flank pain.  Musculoskeletal:  Negative for back pain and myalgias.  Skin:  Negative for itching and rash.  Neurological:  Negative for dizziness and headaches.   Negative unless indicated in HPI    Objective:    BP 95/61   Pulse 87   Temp 97.9 F (36.6 C) (Temporal)   Ht 5\' 4"  (1.626 m)   Wt 127 lb 9.6 oz (57.9 kg)   SpO2 99%   BMI 21.90 kg/m  BP Readings from Last 3 Encounters:  08/08/23 95/61  07/25/23 104/70  07/18/23 101/69   Wt Readings from Last 3 Encounters:  08/08/23 127 lb 9.6 oz (57.9 kg)  07/25/23 127 lb (57.6 kg)  07/18/23 127 lb 3.2 oz (57.7 kg)      Physical Exam Vitals and nursing note reviewed.  Constitutional:      Appearance: Normal appearance.  HENT:     Head: Normocephalic and atraumatic.  Eyes:     Extraocular Movements: Extraocular movements intact.     Conjunctiva/sclera: Conjunctivae normal.     Pupils: Pupils are equal, round, and reactive to light.  Cardiovascular:     Rate and Rhythm: Normal rate and regular rhythm.  Abdominal:     General: Bowel sounds are normal. There is no distension.     Palpations: Abdomen is soft. There is no mass.     Tenderness: There is no abdominal tenderness. There is no right CVA tenderness, left CVA tenderness, guarding or rebound.     Hernia: No hernia is present.  Musculoskeletal:         General: Normal range of motion.     Right lower leg: No edema.     Left lower leg: No edema.  Skin:    General: Skin is warm and dry.     Findings: No rash.  Neurological:     Mental Status: She is alert and oriented to person, place, and time. Mental status is at baseline.  Psychiatric:        Mood and Affect: Mood normal.        Behavior: Behavior normal.        Thought Content: Thought content normal.        Judgment: Judgment normal.    Urine dipstick shows positive for WBC's and positive for RBC's.  Micro exam: 0-2 WBC's per HPF and 0 RBC's per HPF.  No results found for any visits on 08/08/23.      Assessment & Plan:  Dysuria -     Urinalysis, Routine w reflex microscopic -     Cephalexin; Take 1 capsule (500 mg total) by mouth 2 (two) times daily.  Dispense: 14 capsule; Refill: 0 -     Urine Culture  Acute cystitis with hematuria -     Cephalexin; Take 1 capsule (500 mg total) by mouth 2 (two) times daily.  Dispense: 14 capsule; Refill: 0 -     Urine Culture  Suspected UTI: urinalysis routine with reflex- urine culture - start Keflex 500 mg BID fro 7-days Client was made aware that she will be treated with broad-spectrum antibiotic while waiting for culture results.  Based on culture right result if a new antibiotic is needed she will be made aware  increase hydration Okay to use over-the-counter AZO All question answered Return if symptoms worsen or fail to improve.  Arrie Aran Santa Lighter, DNP [facility] [address] [place], [place] [address] [phone]

## 2023-08-08 NOTE — Telephone Encounter (Signed)
Copied from CRM [id number]. Topic: Clinical - Medical Advice >> Aug 08, 2023  2:23 PM Lorin Glass B wrote: Reason for CRM: Patient was seen earlier today and was prescribed antibiotic. Wants to know if the medication to prevent yeast infection while on antibiotic is able to be sent to her pharmacy as well. Did not remember name of medication.

## 2023-08-08 NOTE — Progress Notes (Signed)
Diflucan 150 mg, 1 dose sent to [facility].  Client to take this medication when done taking the antibiotic

## 2023-08-08 NOTE — Telephone Encounter (Signed)
Copied from CRM [id number]. Topic: Clinical - Medical Advice >> Aug 08, 2023  2:23 PM Lorin Glass B wrote: Reason for CRM: Patient was seen earlier today and was prescribed antibiotic. Wants to know if the medication to prevent yeast infection while on antibiotic is able to be sent to her pharmacy as well. Did not remember name of medication.

## 2023-08-09 ENCOUNTER — Other Ambulatory Visit: Payer: Self-pay | Admitting: Family

## 2023-08-09 DIAGNOSIS — E039 Hypothyroidism, unspecified: Secondary | ICD-10-CM

## 2023-08-09 NOTE — Telephone Encounter (Signed)
Left detailed message per dpr  

## 2023-08-12 LAB — URINE CULTURE

## 2023-08-23 ENCOUNTER — Encounter: Payer: Self-pay | Admitting: Nurse Practitioner

## 2023-08-23 ENCOUNTER — Ambulatory Visit (INDEPENDENT_AMBULATORY_CARE_PROVIDER_SITE_OTHER): Payer: Medicare HMO | Admitting: Nurse Practitioner

## 2023-08-23 VITALS — BP 111/74 | HR 84 | Temp 97.9°F | Resp 20 | Ht 64.0 in | Wt 129.0 lb

## 2023-08-23 DIAGNOSIS — K921 Melena: Secondary | ICD-10-CM

## 2023-08-23 DIAGNOSIS — R300 Dysuria: Secondary | ICD-10-CM | POA: Diagnosis not present

## 2023-08-23 LAB — URINALYSIS, COMPLETE
Bilirubin, UA: NEGATIVE
Glucose, UA: NEGATIVE
Ketones, UA: NEGATIVE
Leukocytes,UA: NEGATIVE
Nitrite, UA: NEGATIVE
Protein,UA: NEGATIVE
Specific Gravity, UA: 1.02 (ref 1.005–1.030)
Urobilinogen, Ur: 0.2 mg/dL (ref 0.2–1.0)
pH, UA: 7 (ref 5.0–7.5)

## 2023-08-23 LAB — HEMOGLOBIN, FINGERSTICK: Hemoglobin: 13.3 g/dL (ref 11.1–15.9)

## 2023-08-23 NOTE — Progress Notes (Signed)
Subjective:    Patient ID: Ruth Hopkins, female    DOB: 1966-06-29, 57 y.o.   MRN: [id number]   Chief Complaint: Dysuria   Dysuria     Patient come sin with 2 complaints: - UTI- Had uti last week and was treated with keflex. Wants to make sure urine has cleared up. - blood in stoll for over a month- she has a gi doctor that she sees. Has dx of Crohn's disease. Tried to get appointment with GI but could not be seen. Wants HGB checked. 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   Depression, recurrent (HCC) 05/26/2018   History of adenomatous polyp of colon 12/25/2017   Internal hemorrhoids 12/25/2017   Dense breast tissue on mammogram 07/18/2017   Acquired hypothyroidism 12/10/2016   At high risk for breast cancer 09/30/2016   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   Fatigue 01/10/2014   Irritable bowel syndrome with constipation 01/10/2014   Painful lumpy right breast 09/05/2013   Smoker 07/09/2013   Fibromyalgia 05/24/2013   Migraine with status migrainosus 05/24/2013   Morton's neuroma of right foot 05/24/2013   Pain in joint involving lower leg 05/24/2013   Lesion of plantar nerve 05/24/2013   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  Eyes:  Negative for pain.  Respiratory:  Negative for shortness of breath.   Cardiovascular:  Negative for chest pain, palpitations and leg swelling.  Gastrointestinal:  Negative for abdominal  pain.  Endocrine: Negative for polydipsia.  Genitourinary:  Positive for dysuria.  Skin:  Negative for rash.  Neurological:  Negative for dizziness, weakness and headaches.  Hematological:  Does not bruise/bleed easily.  All other systems reviewed and are negative.      Objective:   Physical Exam Vitals and nursing note reviewed.  Constitutional:      General: She is not in acute distress.    Appearance: Normal appearance. She is well-developed.  Neck:     Vascular: No carotid bruit or JVD.  Cardiovascular:     Rate and Rhythm: Normal rate and regular rhythm.     Heart sounds: Normal heart sounds.  Pulmonary:     Effort: Pulmonary effort is normal. No respiratory distress.     Breath sounds: Normal breath sounds. No wheezing or rales.  Chest:     Chest wall: No tenderness.  Abdominal:     General: Bowel sounds are normal. There is no distension or abdominal bruit.     Palpations: Abdomen is soft. There is no hepatomegaly, splenomegaly, mass or pulsatile mass.     Tenderness: There is no abdominal tenderness. There is no right CVA tenderness or left CVA tenderness.  Musculoskeletal:        General: Normal range of motion.     Cervical back: Normal range of motion and neck supple.  Lymphadenopathy:     Cervical: No cervical adenopathy.  Skin:    General: Skin is warm and dry.  Neurological:     Mental Status: She is alert and oriented to person, place, and time.     Deep Tendon Reflexes: Reflexes are normal and symmetric.  Psychiatric:        Behavior: Behavior normal.        Thought Content: Thought content normal.        Judgment: Judgment normal.    BP 111/74   Pulse 84   Temp 97.9 F (36.6 C) (Temporal)   Resp 20   Ht 5\' 4"  (1.626 m)   Wt 129 lb (58.5 kg)   SpO2 98%   BMI 22.14 kg/m         Assessment & Plan:  Ruth Hopkins in today with chief complaint of Dysuria   1. Dysuria Force fluids - Urinalysis, Complete - Urine Culture  2. Blood in  stool Hgb recheck Needs to follow up with GI    The above assessment and management plan was discussed with the patient. The patient verbalized understanding of and has agreed to the management plan. Patient is aware to call the clinic if symptoms persist or worsen. Patient is aware when to return to the clinic for a follow-up visit. Patient educated on when it is appropriate to go to the emergency department.   [name], FNP

## 2023-08-25 DIAGNOSIS — K625 Hemorrhage of anus and rectum: Secondary | ICD-10-CM | POA: Diagnosis not present

## 2023-08-25 DIAGNOSIS — R1084 Generalized abdominal pain: Secondary | ICD-10-CM | POA: Diagnosis not present

## 2023-08-25 LAB — URINE CULTURE

## 2023-08-26 ENCOUNTER — Other Ambulatory Visit: Payer: Self-pay

## 2023-08-29 ENCOUNTER — Other Ambulatory Visit: Payer: Self-pay

## 2023-08-29 ENCOUNTER — Other Ambulatory Visit: Payer: Self-pay | Admitting: Family

## 2023-08-29 DIAGNOSIS — F17210 Nicotine dependence, cigarettes, uncomplicated: Secondary | ICD-10-CM

## 2023-08-29 DIAGNOSIS — Z122 Encounter for screening for malignant neoplasm of respiratory organs: Secondary | ICD-10-CM

## 2023-08-29 DIAGNOSIS — G43901 Migraine, unspecified, not intractable, with status migrainosus: Secondary | ICD-10-CM

## 2023-08-29 DIAGNOSIS — Z87891 Personal history of nicotine dependence: Secondary | ICD-10-CM

## 2023-08-30 ENCOUNTER — Encounter: Payer: Self-pay | Admitting: Family Medicine

## 2023-08-30 ENCOUNTER — Ambulatory Visit (INDEPENDENT_AMBULATORY_CARE_PROVIDER_SITE_OTHER): Payer: Medicare HMO | Admitting: Family Medicine

## 2023-08-30 ENCOUNTER — Other Ambulatory Visit: Payer: Self-pay | Admitting: Family

## 2023-08-30 VITALS — BP 97/62 | HR 79 | Temp 97.7°F | Ht 64.0 in | Wt 129.4 lb

## 2023-08-30 DIAGNOSIS — J439 Emphysema, unspecified: Secondary | ICD-10-CM | POA: Insufficient documentation

## 2023-08-30 DIAGNOSIS — G959 Disease of spinal cord, unspecified: Secondary | ICD-10-CM | POA: Diagnosis not present

## 2023-08-30 MED ORDER — BETAMETHASONE SOD PHOS & ACET 6 (3-3) MG/ML IJ SUSP
6.0000 mg | Freq: Once | INTRAMUSCULAR | Status: AC
  Administered 2023-08-30: 6 mg via INTRAMUSCULAR

## 2023-08-30 MED ORDER — TIZANIDINE HCL 4 MG PO TABS: 4.0000 mg | ORAL_TABLET | Freq: Four times a day (QID) | ORAL | 1 refills | Status: DC | PRN

## 2023-08-30 NOTE — Progress Notes (Signed)
Subjective:  Patient ID: Ruth Hopkins, female    DOB: 1966/01/17  Age: 57 y.o. MRN: [id number]  CC: Neck Pain   HPI Ruth Hopkins presents for recent flare of pain that started in 2011. Started after decorating for christmas. Throbbing, popping in back of head. And into upper thorcic area Some nausea. Took some prednisone.20 mg helps the neck pain. Took for the last three days from an old prescription.      08/30/2023   11:41 AM 08/30/2023   11:38 AM 08/23/2023   12:21 PM  Depression screen PHQ 2/9  Decreased Interest 0 0 0  Down, Depressed, Hopeless 1 1 2   PHQ - 2 Score 1 1 2   Altered sleeping 2 2 1   Tired, decreased energy 0 0 0  Change in appetite 0 0 0  Feeling bad or failure about yourself  0 0 0  Trouble concentrating 0 0 0  Moving slowly or fidgety/restless 0 0 0  Suicidal thoughts 0 0 0  PHQ-9 Score 3 3 3   Difficult doing work/chores Very difficult Very difficult Extremely dIfficult    History [place] has a past medical history of Chronic fatigue, Depression, Fibromyalgia, IBS (irritable bowel syndrome), Migraine, and Thyroid disease.   She has a past surgical history that includes Foot surgery and Breast surgery (Right, 2007).   Her family history includes Asthma in her mother; Hypertension in her father and mother; Hypothyroidism in her mother; Raynaud syndrome in her mother.She reports that she has been smoking cigarettes. She started smoking about 45 years ago. She has a 45.8 pack-year smoking history. She has never used smokeless tobacco. She reports that she does not drink alcohol and does not use drugs.    ROS Review of Systems  Constitutional: Negative.   HENT: Negative.    Eyes:  Negative for visual disturbance.  Respiratory:  Negative for shortness of breath.   Cardiovascular:  Negative for chest pain.  Gastrointestinal:  Negative for abdominal pain.  Musculoskeletal:  Positive for neck pain. Negative for arthralgias.    Objective:  BP 97/62   Pulse  79   Temp 97.7 F (36.5 C)   Ht 5\' 4"  (1.626 m)   Wt 129 lb 6.4 oz (58.7 kg)   SpO2 99%   BMI 22.21 kg/m   BP Readings from Last 3 Encounters:  08/30/23 97/62  08/23/23 111/74  08/08/23 95/61    Wt Readings from Last 3 Encounters:  08/30/23 129 lb 6.4 oz (58.7 kg)  08/23/23 129 lb (58.5 kg)  08/08/23 127 lb 9.6 oz (57.9 kg)     Physical Exam Constitutional:      General: She is not in acute distress.    Appearance: She is well-developed.  Cardiovascular:     Rate and Rhythm: Normal rate and regular rhythm.  Pulmonary:     Breath sounds: Normal breath sounds.  Musculoskeletal:        General: Normal range of motion.  Skin:    General: Skin is warm and dry.  Neurological:     Mental Status: She is alert and oriented to person, place, and time.      Assessment & Plan:   Ruth Hopkins was seen today for neck pain.  Diagnoses and all orders for this visit:  Cervical myelopathy (HCC) -     betamethasone acetate-betamethasone sodium phosphate (CELESTONE) injection 6 mg  Other orders -     tiZANidine (ZANAFLEX) 4 MG tablet; Take 1 tablet (4 mg total) by mouth every  6 (six) hours as needed for muscle spasms.       I have discontinued Wendelyn Breslow. Gee's topiramate, meclizine, and fluconazole. I am also having her start on tiZANidine. Additionally, I am having her maintain her Multiple Vitamins-Minerals (MULTIVITAMIN PO), acetaminophen, cycloSPORINE, imipramine, estradiol, valACYclovir, clonazePAM, tacrolimus, ondansetron, methocarbamol, Fluocinolone Acetonide, mometasone, finasteride, omeprazole, fluticasone, levothyroxine, and eletriptan. We administered betamethasone acetate-betamethasone sodium phosphate.  Allergies as of 08/30/2023       Reactions   Latex Itching, Rash   Codeine    Gabapentin    "fuzzy"   Sulfur Itching   Sulfa Antibiotics Hives, Itching, Rash        Medication List        Accurate as of August 30, 2023 11:59 PM. If you have any questions,  ask your nurse or doctor.          STOP taking these medications    fluconazole 150 MG tablet Commonly known as: Diflucan Stopped by: Zamirah Denny   meclizine 50 MG tablet Commonly known as: Facilities manager by: Tylasia Fletchall   topiramate 25 MG tablet Commonly known as: Topamax Stopped by: Chanique Duca       TAKE these medications    acetaminophen 500 MG tablet Commonly known as: TYLENOL Take by mouth.   clonazePAM 0.5 MG tablet Commonly known as: KLONOPIN Take 1 tablet (0.5 mg total) by mouth 2 (two) times daily.   cycloSPORINE 0.05 % ophthalmic emulsion Commonly known as: RESTASIS Restasis 0.05 % eye drops in a dropperette  USE 1 DROP INTO BOTH EYES TWICE A DAY   eletriptan 40 MG tablet Commonly known as: RELPAX TAKE 1 TABLET BY MOUTH EVERY 2 HOURS AS NEEDED FOR MIGRAINE HEADACHE . DO NOT EXCEED 6 PER 24 HOURS   estradiol 0.075 MG/24HR Commonly known as: VIVELLE-DOT Place 1 patch onto the skin 2 (two) times a week.   finasteride 5 MG tablet Commonly known as: PROSCAR Take by mouth.   Fluocinolone Acetonide 0.01 % Oil Commonly known as: Flac Place 1 drop in ear(s) daily. Place 1-2 drop in right ear   fluticasone 50 MCG/ACT nasal spray Commonly known as: FLONASE Place 2 sprays into both nostrils daily.   imipramine 10 MG tablet Commonly known as: TOFRANIL Take 3 tablets (30 mg total) by mouth at bedtime.   levothyroxine 50 MCG tablet Commonly known as: SYNTHROID TAKE 1 TABLET BY MOUTH ONCE DAILY BEFORE BREAKFAST   methocarbamol 500 MG tablet Commonly known as: ROBAXIN Take 1 tablet (500 mg total) by mouth every 8 (eight) hours as needed for muscle spasms.   mometasone 50 MCG/ACT nasal spray Commonly known as: NASONEX PLACE 2 SPRAYS INTO THE NOSE DAILY.   MULTIVITAMIN PO Take by mouth.   omeprazole 20 MG capsule Commonly known as: PRILOSEC Take 1 capsule by mouth once daily   ondansetron 4 MG tablet Commonly known as: Zofran Take 1  tablet (4 mg total) by mouth every 8 (eight) hours as needed for nausea or vomiting.   tacrolimus 0.1 % ointment Commonly known as: PROTOPIC tacrolimus 0.1 % topical ointment  APPLY OINTMENT TOPICALLY TO AFFECTED AREA TWICE DAILY   tiZANidine 4 MG tablet Commonly known as: ZANAFLEX Take 1 tablet (4 mg total) by mouth every 6 (six) hours as needed for muscle spasms. Started by: Earl Zellmer   valACYclovir 500 MG tablet Commonly known as: VALTREX Take 500 mg by mouth daily as needed.         Follow-up: Return if symptoms worsen or  fail to improve.  Mechele Claude, M.D.

## 2023-08-31 DIAGNOSIS — L658 Other specified nonscarring hair loss: Secondary | ICD-10-CM | POA: Diagnosis not present

## 2023-08-31 DIAGNOSIS — L650 Telogen effluvium: Secondary | ICD-10-CM | POA: Diagnosis not present

## 2023-09-02 ENCOUNTER — Encounter: Payer: Self-pay | Admitting: Family Medicine

## 2023-09-08 ENCOUNTER — Other Ambulatory Visit: Payer: Self-pay | Admitting: Family

## 2023-09-08 DIAGNOSIS — K219 Gastro-esophageal reflux disease without esophagitis: Secondary | ICD-10-CM

## 2023-09-12 ENCOUNTER — Encounter: Payer: Self-pay | Admitting: Family

## 2023-09-12 ENCOUNTER — Ambulatory Visit (INDEPENDENT_AMBULATORY_CARE_PROVIDER_SITE_OTHER): Payer: Medicare HMO

## 2023-09-12 ENCOUNTER — Ambulatory Visit (INDEPENDENT_AMBULATORY_CARE_PROVIDER_SITE_OTHER): Payer: Medicare HMO | Admitting: Family

## 2023-09-12 VITALS — BP 104/69 | HR 83 | Temp 97.6°F | Ht 64.0 in | Wt 129.8 lb

## 2023-09-12 DIAGNOSIS — M542 Cervicalgia: Secondary | ICD-10-CM | POA: Diagnosis not present

## 2023-09-12 MED ORDER — PREDNISONE 20 MG PO TABS: 40.0000 mg | ORAL_TABLET | Freq: Every day | ORAL | 0 refills | Status: AC

## 2023-09-12 MED ORDER — METHOCARBAMOL 500 MG PO TABS: 500.0000 mg | ORAL_TABLET | Freq: Four times a day (QID) | ORAL | 2 refills | Status: DC

## 2023-09-12 MED ORDER — DICLOFENAC SODIUM 75 MG PO TBEC: 75.0000 mg | DELAYED_RELEASE_TABLET | Freq: Two times a day (BID) | ORAL | 2 refills | Status: AC

## 2023-09-12 NOTE — Patient Instructions (Signed)
Cervical Radiculopathy  Cervical radiculopathy means that a nerve in the neck (a cervical nerve) is pinched or bruised. This can happen because of an injury to the cervical spine (vertebrae) in the neck, or as a normal part of getting older. This condition can cause pain or loss of feeling (numbness) that runs from your neck all the way down to your arm and fingers. Often, this condition gets better with rest. Treatment may be needed if the condition does not get better. What are the causes? A neck injury. A bulging disk in your spine. Sudden muscle tightening (muscle spasms). Tight muscles in your neck due to overuse. Arthritis. Breakdown in the bones and joints of the spine (spondylosis) due to getting older. Bone spurs that form near the nerves in the neck. What are the signs or symptoms? Pain. The pain may: Run from the neck to the arm and hand. Be very bad or irritating. Get worse when you move your neck. Loss of feeling or tingling in your arm or hand. Weakness in your arm or hand, in very bad cases. How is this treated? In many cases, treatment is not needed for this condition. With rest, the condition often gets better over time. If treatment is needed, options may include: Wearing a soft neck collar (cervical collar) for short periods of time. Doing exercises (physical therapy) to strengthen your neck muscles. Taking medicines. Having shots (injections) in your spine, in very bad cases. Having surgery. This may be needed if other treatments do not help. The type of surgery that is used will depend on the cause of your condition. Follow these instructions at home: If you have a soft neck collar: Wear it as told by your doctor. Take it off only as told by your doctor. Ask your doctor if you can take the collar off for cleaning and bathing. If you are allowed to take the collar off for cleaning or bathing: Follow instructions from your doctor about how to take off the collar  safely. Clean the collar by wiping it with mild soap and water and drying it completely. Take out any removable pads in the collar every 1-2 days. Wash them by hand with soap and water. Let them air-dry completely before you put them back in the collar. Check your skin under the collar for redness or sores. If you see any, tell your doctor. Managing pain     Take over-the-counter and prescription medicines only as told by your doctor. If told, put ice on the painful area. To do this: If you have a soft neck collar, take if off as told by your doctor. Put ice in a plastic bag. Place a towel between your skin and the bag. Leave the ice on for 20 minutes, 2-3 times a day. Take off the ice if your skin turns bright red. This is very important. If you cannot feel pain, heat, or cold, you have a greater risk of damage to the area. If using ice does not help, you can try using heat. Use the heat source that your doctor recommends, such as a moist heat pack or a heating pad. Place a towel between your skin and the heat source. Leave the heat on for 20-30 minutes. Take off the heat if your skin turns bright red. This is very important. If you cannot feel pain, heat, or cold, you have a greater risk of getting burned. You may try a gentle neck and shoulder rub (massage). Activity Rest as needed. Return  to your normal activities when your doctor says that it is safe. Do exercises as told by your doctor or physical therapist. You may have to avoid lifting. Ask your doctor how much you can safely lift. General instructions Use a flat pillow when you sleep. Do not drive while wearing a soft neck collar. If you do not have a soft neck collar, ask your doctor if it is safe to drive while your neck heals. Ask your doctor if you should avoid driving or using machines while you are taking your medicine. Do not smoke or use any products that contain nicotine or tobacco. If you need help quitting, ask your  doctor. Keep all follow-up visits. Contact a doctor if: Your condition does not get better with treatment. Get help right away if: Your pain gets worse and medicine does not help. You lose feeling or feel weak in your hand, arm, face, or leg. You have a high fever. Your neck is stiff. You cannot control when you poop or pee (have incontinence). You have trouble with walking, balance, or talking. Summary Cervical radiculopathy means that a nerve in the neck is pinched or bruised. A nerve can get pinched from a bulging disk, arthritis, an injury to the neck, or other causes. Symptoms include pain, tingling, or loss of feeling that goes from the neck to the arm or hand. Weakness in your arm or hand can happen in very bad cases. Treatment may include resting, wearing a soft neck collar, and doing exercises. You might need to take medicines for pain. In very bad cases, shots or surgery may be needed. This information is not intended to replace advice given to you by your health care provider. Make sure you discuss any questions you have with your health care provider. Document Revised: 03/12/2021 Document Reviewed: 03/12/2021 Elsevier Patient Education  2024 [employer].

## 2023-09-12 NOTE — Progress Notes (Signed)
Subjective:    Patient ID: Ruth Hopkins, female    DOB: Mar 28, 1966, 57 y.o.   MRN: [id number]  Chief Complaint  Patient presents with   Neck Pain    Back neck pain x 3 weeks. Patient was seen recently and states she is no better.    PT presents to the office today with neck pain. Reports she stopped her diclofenac and has noticed increased pain in the base of her neck. Reports she had a whiplash injury in the past.  Neck Pain  This is a recurrent problem. The current episode started 1 to 4 weeks ago. The problem occurs intermittently. The problem has been waxing and waning. The pain is present in the left side, midline and right side. The quality of the pain is described as aching. The pain is at a severity of 6/10 (has been up to a 9 at times). The pain is mild. The symptoms are aggravated by sneezing. Associated symptoms include headaches and photophobia. Pertinent negatives include no fever, trouble swallowing or visual change. She has tried NSAIDs, acetaminophen and bed rest for the symptoms. The treatment provided mild relief.      Review of Systems  Constitutional:  Negative for fever.  HENT:  Negative for trouble swallowing.   Eyes:  Positive for photophobia.  Musculoskeletal:  Positive for neck pain.  Neurological:  Positive for headaches.  All other systems reviewed and are negative.   Social History   Socioeconomic History   Marital status: Married    Spouse name: Not on file   Number of children: 0   Years of education: Not on file   Highest education level: Associate degree: occupational, Scientist, product/process development, or vocational program  Occupational History   Occupation: disability  Tobacco Use   Smoking status: Every Day    Current packs/day: 1.00    Average packs/day: 1 pack/day for 45.9 years (45.9 ttl pk-yrs)    Types: Cigarettes    Start date: 11/02/1977   Smokeless tobacco: Never   Tobacco comments:    Smoking since age 67 - 1ppd usually - has tried to quit several times  - is afraid it will make her gain weight    Started smoking again a few weeks ago, maybe 15 ciggs per day --12/16/2022  Vaping Use   Vaping status: Never Used  Substance and Sexual Activity   Alcohol use: No   Drug use: No   Sexual activity: Not on file  Other Topics Concern   Not on file  Social History Narrative   Lives home with husband in one level home   Her mother and sister live nearby   [employer] [place] faith   Social Drivers of Health   Financial Resource Strain: Medium Risk (08/29/2023)   Overall Financial Resource Strain (CARDIA)    Difficulty of Paying Living Expenses: Somewhat hard  Food Insecurity: No Food Insecurity (08/29/2023)   Hunger Vital Sign    Worried About Running Out of Food in the Last Year: Never true    Ran Out of Food in the Last Year: Never true  Transportation Needs: No Transportation Needs (08/29/2023)   PRAPARE - Administrator, Civil Service (Medical): No    Lack of Transportation (Non-Medical): No  Physical Activity: Inactive (08/29/2023)   Exercise Vital Sign    Days of Exercise per Week: 0 days    Minutes of Exercise per Session: 0 min  Stress: Stress Concern Present (08/29/2023)   [employer] of Occupational Health -  Occupational Stress Questionnaire    Feeling of Stress : Rather much  Social Connections: Socially Integrated (08/29/2023)   Social Connection and Isolation Panel [NHANES]    Frequency of Communication with Friends and Family: More than three times a week    Frequency of Social Gatherings with Friends and Family: More than three times a week    Attends Religious Services: More than 4 times per year    Active Member of [employer] or Organizations: Yes    Attends Engineer, structural: More than 4 times per year    Marital Status: Married   Family History  Problem Relation Age of Onset   Asthma Mother    Hypothyroidism Mother    Hypertension Mother    Raynaud syndrome Mother    Hypertension Father          Objective:   Physical Exam    BP 104/69   Pulse 83   Temp 97.6 F (36.4 C)   Ht 5\' 4"  (1.626 m)   Wt 129 lb 12.8 oz (58.9 kg)   SpO2 99%   BMI 22.28 kg/m      Assessment & Plan:  [name] comes in today with chief complaint of Neck Pain (Back neck pain x 3 weeks. Patient was seen recently and states she is no better. )   Diagnosis and orders addressed:  1. Neck pain (Primary) ROM exercises  Will give prednisone and start diclofenac  ROM exercises  Will only take dicolfenac BID with food, no other NSAID's - DG Cervical Spine Complete; Future - methocarbamol (ROBAXIN) 500 MG tablet; Take 1 tablet (500 mg total) by mouth 4 (four) times daily.  Dispense: 20 tablet; Refill: 2 - predniSONE (DELTASONE) 20 MG tablet; Take 2 tablets (40 mg total) by mouth daily with breakfast for 5 days.  Dispense: 10 tablet; Refill: 0 - diclofenac (VOLTAREN) 75 MG EC tablet; Take 1 tablet (75 mg total) by mouth 2 (two) times daily.  Dispense: 30 tablet; Refill: 2     Jannifer Rodney, FNP

## 2023-10-03 ENCOUNTER — Ambulatory Visit: Payer: Medicare HMO | Admitting: Family

## 2023-10-04 ENCOUNTER — Other Ambulatory Visit: Payer: Self-pay

## 2023-10-04 ENCOUNTER — Telehealth (INDEPENDENT_AMBULATORY_CARE_PROVIDER_SITE_OTHER): Payer: Medicare HMO | Admitting: Nurse Practitioner

## 2023-10-04 ENCOUNTER — Encounter: Payer: Self-pay | Admitting: Nurse Practitioner

## 2023-10-04 DIAGNOSIS — R110 Nausea: Secondary | ICD-10-CM | POA: Diagnosis not present

## 2023-10-04 DIAGNOSIS — R3915 Urgency of urination: Secondary | ICD-10-CM | POA: Diagnosis not present

## 2023-10-04 LAB — URINALYSIS, COMPLETE
Bilirubin, UA: NEGATIVE
Glucose, UA: NEGATIVE
Ketones, UA: NEGATIVE
Leukocytes,UA: NEGATIVE
Nitrite, UA: NEGATIVE
Protein,UA: NEGATIVE
Specific Gravity, UA: 1.01 (ref 1.005–1.030)
Urobilinogen, Ur: 0.2 mg/dL (ref 0.2–1.0)
pH, UA: 7 (ref 5.0–7.5)

## 2023-10-04 LAB — MICROSCOPIC EXAMINATION
Renal Epithel, UA: NONE SEEN /[HPF]
Yeast, UA: NONE SEEN

## 2023-10-04 NOTE — Progress Notes (Addendum)
 Virtual Visit Consent   Ruth Hopkins, you are scheduled for a virtual visit with Mary-Margaret Gladis, FNP, a [facility] provider, today.     Just as with appointments in the office, your consent must be obtained to participate.  Your consent will be active for this visit and any virtual visit you may have with one of our providers in the next 365 days.     If you have a MyChart account, a copy of this consent can be sent to you electronically.  All virtual visits are billed to your insurance company just like a traditional visit in the office.    As this is a virtual visit, video technology does not allow for your provider to perform a traditional examination.  This may limit your provider's ability to fully assess your condition.  If your provider identifies any concerns that need to be evaluated in person or the need to arrange testing (such as labs, EKG, etc.), we will make arrangements to do so.     Although advances in technology are sophisticated, we cannot ensure that it will always work on either your end or our end.  If the connection with a video visit is poor, the visit may have to be switched to a telephone visit.  With either a video or telephone visit, we are not always able to ensure that we have a secure connection.     I need to obtain your verbal consent now.   Are you willing to proceed with your visit today? YES   Ruth Hopkins has provided verbal consent on 10/04/2023 for a virtual visit (video or telephone).   Mary-Margaret Gladis, FNP   Date: 10/04/2023 10:24 AM   Virtual Visit via Video Note   I, Mary-Margaret Gladis, connected with Ruth Hopkins ([id number], 04-16-1966) on 10/04/23 at 10:30 AM EST by a video-enabled telemedicine application and verified that I am speaking with the correct person using two identifiers.  Location: Patient: Virtual Visit Location Patient: Home Provider: Virtual Visit Location Provider: Mobile   I discussed the limitations of  evaluation and management by telemedicine and the availability of in person appointments. The patient expressed understanding and agreed to proceed.    History of Present Illness: Ruth Hopkins is a 58 y.o. who identifies as a female who was assigned female at birth, and is being seen today for UTI.  Ruth Hopkins Tract Infection  This is a new problem. The current episode started in the past 7 days. The problem occurs every urination. The problem has been waxing and waning. The quality of the pain is described as burning. The pain is mild. There has been no fever. She is Sexually active. There is No history of pyelonephritis. Associated symptoms include frequency, hesitancy, nausea and urgency. Pertinent negatives include no discharge. She has tried nothing for the symptoms. The treatment provided mild relief.  HPI  Review of Systems  Constitutional:  Negative for chills and fever.  Genitourinary:  Positive for dysuria, frequency and urgency.    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   Depression, recurrent (HCC) 05/26/2018   History of adenomatous polyp of colon 12/25/2017   Internal hemorrhoids 12/25/2017   Dense breast tissue on mammogram 07/18/2017   Acquired hypothyroidism 12/10/2016   At high risk for breast cancer 09/30/2016   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   Fatigue 01/10/2014   Irritable bowel syndrome with constipation 01/10/2014   Painful lumpy right breast 09/05/2013   Smoker 07/09/2013   Fibromyalgia 05/24/2013   Migraine with status migrainosus 05/24/2013    Morton's neuroma of right foot 05/24/2013   Pain in joint involving lower leg 05/24/2013   Lesion of plantar nerve 05/24/2013   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    Allergies:  Allergies  Allergen Reactions   Latex Itching and Rash   Codeine    Gabapentin      fuzzy   Sulfur Itching   Sulfa Antibiotics Hives, Itching and Rash   Medications:  Current Outpatient Medications:    acetaminophen  (TYLENOL ) 500 MG tablet, Take by mouth., Disp: , Rfl:    clonazePAM  (KLONOPIN ) 0.5 MG tablet, Take 1 tablet (0.5 mg total) by mouth 2 (two) times daily., Disp: 30 tablet, Rfl: 2   cycloSPORINE (RESTASIS) 0.05 % ophthalmic emulsion, Restasis 0.05 % eye drops in a dropperette  USE 1 DROP INTO BOTH EYES TWICE A DAY, Disp: , Rfl:    diclofenac  (VOLTAREN ) 75 MG EC tablet, Take 1 tablet (75 mg total) by mouth 2 (two) times daily., Disp: 30 tablet, Rfl: 2   eletriptan  (RELPAX ) 40 MG tablet, TAKE 1 TABLET BY MOUTH EVERY 2 HOURS AS NEEDED FOR MIGRAINE HEADACHE . DO NOT EXCEED 6 PER 24 HOURS, Disp: 12 tablet, Rfl: 2   estradiol  (VIVELLE -DOT) 0.075 MG/24HR, Place 1 patch onto the skin 2 (two) times a week., Disp: , Rfl:    finasteride (PROSCAR) 5 MG tablet, Take by mouth., Disp: , Rfl:    Fluocinolone  Acetonide (FLAC ) 0.01 % OIL, Place 1 drop in ear(s) daily. Place 1-2 drop in right ear, Disp: 20 mL, Rfl: 0   fluticasone  (FLONASE ) 50 MCG/ACT nasal spray, Place 2 sprays into both nostrils daily., Disp: 16 g, Rfl: 6   imipramine  (TOFRANIL ) 10 MG tablet, Take 3 tablets (30 mg total) by mouth at bedtime., Disp: 90 tablet, Rfl: 11   levothyroxine  (SYNTHROID ) 50 MCG tablet, TAKE 1 TABLET BY MOUTH ONCE DAILY BEFORE BREAKFAST, Disp: 90 tablet, Rfl: 3   methocarbamol  (ROBAXIN ) 500 MG tablet, Take 1 tablet (500 mg total) by mouth 4 (four) times daily., Disp: 20 tablet, Rfl: 2   mometasone  (NASONEX ) 50 MCG/ACT nasal spray, PLACE 2 SPRAYS INTO THE NOSE DAILY., Disp: 51 each, Rfl: 1   Multiple  Vitamins-Minerals (MULTIVITAMIN PO), Take by mouth., Disp: , Rfl:    omeprazole  (PRILOSEC) 20 MG capsule, Take 1 capsule by mouth once daily, Disp: 90 capsule, Rfl: 0   ondansetron  (ZOFRAN ) 4 MG tablet, Take 1 tablet (4 mg total) by mouth every 8 (eight) hours as needed for nausea or vomiting., Disp: 20 tablet, Rfl: 0   tacrolimus  (PROTOPIC ) 0.1 % ointment, tacrolimus  0.1 % topical ointment  APPLY OINTMENT TOPICALLY TO AFFECTED AREA TWICE DAILY, Disp: , Rfl:    valACYclovir  (VALTREX ) 500 MG tablet, Take 500 mg by mouth daily as needed., Disp: , Rfl:   Observations/Objective: Patient is well-developed, well-nourished in no acute distress.  Resting comfortably  at home.  Head is normocephalic, atraumatic.  No labored breathing.  Speech is clear and coherent with logical content.  Patient is alert and oriented at baseline.  No abdominal pain  Assessment and Plan:  CARLYNN LEDUC in today with chief complaint of No chief complaint on file.   1. Nausea (Primary) - Urinalysis, Complete; Future  2. Urgency of urination Patient to come in and leave urine specimen- will call her with results - Urinalysis, Complete; Future - Urine Culture   Follow Up Instructions: I discussed the assessment and treatment plan with the patient. The patient was provided an opportunity to ask questions and all were answered. The patient agreed with the plan and demonstrated an understanding of the instructions.  A copy of instructions were sent to the patient via MyChart.  The patient was advised to call back or seek an in-person evaluation if the symptoms worsen or if the condition fails to improve as anticipated.  Time:  I spent 5 minutes with the patient via telehealth technology discussing the above problems/concerns.    Mary-Margaret Gladis, FNP  Addendum- urine clear-Mary-Margaret Gladis, FNP

## 2023-10-05 ENCOUNTER — Ambulatory Visit: Payer: Medicare HMO | Admitting: Podiatry

## 2023-10-05 ENCOUNTER — Encounter: Payer: Self-pay | Admitting: Podiatry

## 2023-10-05 DIAGNOSIS — L600 Ingrowing nail: Secondary | ICD-10-CM

## 2023-10-05 NOTE — Patient Instructions (Signed)

## 2023-10-05 NOTE — Progress Notes (Signed)
 Subjective:   Patient ID: Ruth Hopkins, female   DOB: 58 y.o.   MRN: [id number]   HPI Patient presents stating that the left nail has become very loose and it is thickened and at times bothersome.  Right hallux has discoloration but does not bother   ROS      Objective:  Physical Exam  Neurovascular status found to be intact with a damaged left hallux nail loose no active drainage noted with discoloration pattern damage     Assessment:  Damage nail with traumatized component with lifted nail bed     Plan:  H&P reviewed and at this point I recommended temporary removal of the nail even though ultimately could require permanent type procedure.  She understands this I anesthetized 60 mg like Marcaine mixture sterile prep done using sterile instrumentation remove the hallux nail clean the bed applied sterile dressing instructed on soaks with Epsom salts water that she will start 24 hours

## 2023-10-06 LAB — URINE CULTURE

## 2023-10-19 DIAGNOSIS — R92333 Mammographic heterogeneous density, bilateral breasts: Secondary | ICD-10-CM | POA: Diagnosis not present

## 2023-10-19 DIAGNOSIS — N6001 Solitary cyst of right breast: Secondary | ICD-10-CM | POA: Diagnosis not present

## 2023-10-19 DIAGNOSIS — Z1330 Encounter for screening examination for mental health and behavioral disorders, unspecified: Secondary | ICD-10-CM | POA: Diagnosis not present

## 2023-10-19 DIAGNOSIS — Z86000 Personal history of in-situ neoplasm of breast: Secondary | ICD-10-CM | POA: Diagnosis not present

## 2023-10-19 DIAGNOSIS — Z9189 Other specified personal risk factors, not elsewhere classified: Secondary | ICD-10-CM | POA: Diagnosis not present

## 2023-11-02 DIAGNOSIS — K50111 Crohn's disease of large intestine with rectal bleeding: Secondary | ICD-10-CM | POA: Diagnosis not present

## 2023-11-22 DIAGNOSIS — R92333 Mammographic heterogeneous density, bilateral breasts: Secondary | ICD-10-CM | POA: Diagnosis not present

## 2023-11-22 DIAGNOSIS — R922 Inconclusive mammogram: Secondary | ICD-10-CM | POA: Diagnosis not present

## 2023-11-22 DIAGNOSIS — Z86000 Personal history of in-situ neoplasm of breast: Secondary | ICD-10-CM | POA: Diagnosis not present

## 2023-11-22 DIAGNOSIS — Z9189 Other specified personal risk factors, not elsewhere classified: Secondary | ICD-10-CM | POA: Diagnosis not present

## 2023-11-22 DIAGNOSIS — N6001 Solitary cyst of right breast: Secondary | ICD-10-CM | POA: Diagnosis not present

## 2023-11-22 LAB — HM MAMMOGRAPHY

## 2023-11-24 DIAGNOSIS — M25561 Pain in right knee: Secondary | ICD-10-CM | POA: Diagnosis not present

## 2023-12-04 ENCOUNTER — Other Ambulatory Visit: Payer: Self-pay | Admitting: Family

## 2023-12-04 DIAGNOSIS — K219 Gastro-esophageal reflux disease without esophagitis: Secondary | ICD-10-CM

## 2023-12-06 ENCOUNTER — Other Ambulatory Visit: Payer: Self-pay | Admitting: Family

## 2023-12-06 DIAGNOSIS — K219 Gastro-esophageal reflux disease without esophagitis: Secondary | ICD-10-CM

## 2023-12-15 DIAGNOSIS — K50111 Crohn's disease of large intestine with rectal bleeding: Secondary | ICD-10-CM | POA: Diagnosis not present

## 2023-12-19 ENCOUNTER — Ambulatory Visit: Payer: Self-pay

## 2023-12-19 ENCOUNTER — Ambulatory Visit (INDEPENDENT_AMBULATORY_CARE_PROVIDER_SITE_OTHER): Admitting: Nurse Practitioner

## 2023-12-19 ENCOUNTER — Encounter: Payer: Self-pay | Admitting: Nurse Practitioner

## 2023-12-19 VITALS — BP 94/62 | HR 84 | Temp 97.9°F | Ht 64.0 in | Wt 129.0 lb

## 2023-12-19 DIAGNOSIS — L659 Nonscarring hair loss, unspecified: Secondary | ICD-10-CM

## 2023-12-19 DIAGNOSIS — R5383 Other fatigue: Secondary | ICD-10-CM

## 2023-12-19 LAB — URINALYSIS, ROUTINE W REFLEX MICROSCOPIC
Bilirubin, UA: NEGATIVE
Glucose, UA: NEGATIVE
Ketones, UA: NEGATIVE
Leukocytes,UA: NEGATIVE
Nitrite, UA: NEGATIVE
Protein,UA: NEGATIVE
Specific Gravity, UA: 1.02 (ref 1.005–1.030)
Urobilinogen, Ur: 0.2 mg/dL (ref 0.2–1.0)
pH, UA: 7 (ref 5.0–7.5)

## 2023-12-19 LAB — MICROSCOPIC EXAMINATION
Renal Epithel, UA: NONE SEEN /HPF
WBC, UA: NONE SEEN /HPF (ref 0–5)
Yeast, UA: NONE SEEN

## 2023-12-19 NOTE — Telephone Encounter (Signed)
 Chief Complaint: Weakness Symptoms: hair loss Frequency: x 2 weeks Pertinent Negatives: Patient denies fever,  Disposition: [] ED /[] Urgent Care (no appt availability in office) / [x] Appointment(In office/virtual)/ []  [facility]/ [] Home Care/ [] Refused Recommended Disposition /[] [facility]/ []  Follow-up with PCP Additional Notes: Pt reports she has been experiencing increasing weakness, fatigue, low energy that began about 2 weeks ago, she reports about 1 week ago she began experiencing increasing hair loss as well. Pt denies SOB, fever, CP. OV scheduled. This RN educated pt on home care, new-worsening symptoms, when to call back/seek emergent care. Pt verbalized understanding and agrees to plan.    Copied from CRM [id number]. Topic: Clinical - Red Word Triage >> Dec 19, 2023  1:29 PM Ivette P wrote: Red Word that prompted transfer to Nurse Triage: Pt feels as if she may have Low Iron, Says its been going on for a couple weeks. Pt is feeling a constant weakness. Pt states hair loss started happening for about a week. Reason for Disposition  [1] MODERATE weakness (i.e., interferes with work, school, normal activities) AND [2] cause unknown  (Exceptions: Weakness from acute minor illness or poor fluid intake; weakness is chronic and not worse.)  Answer Assessment - Initial Assessment Questions 1. DESCRIPTION: "Describe how you are feeling."     Generalized weakness, fatigue 2. SEVERITY: "How bad is it?"  "Can you stand and walk?"   - MILD (0-3): Feels weak or tired, but does not interfere with work, school or normal activities.   - MODERATE (4-7): Able to stand and walk; weakness interferes with work, school, or normal activities.   - SEVERE (8-10): Unable to stand or walk; unable to do usual activities.     Mild-Mod 3. ONSET: "When did these symptoms begin?" (e.g., hours, days, weeks, months)     Weakness began x 2 weeks ago, last week noticed increased hair loss 4.  CAUSE: "What do you think is causing the weakness or fatigue?" (e.g., not drinking enough fluids, medical problem, trouble sleeping)     Pt believes her iron might be low 5. NEW MEDICINES:  "Have you started on any new medicines recently?" (e.g., opioid pain medicines, benzodiazepines, muscle relaxants, antidepressants, antihistamines, neuroleptics, beta blockers)     None 6. OTHER SYMPTOMS: "Do you have any other symptoms?" (e.g., chest pain, fever, cough, SOB, vomiting, diarrhea, bleeding, other areas of pain)     Hair loss  Protocols used: Weakness (Generalized) and Fatigue-A-AH

## 2023-12-19 NOTE — Progress Notes (Signed)
 Subjective:    Patient ID: Ruth Hopkins, female    DOB: 1965-10-09, 58 y.o.   MRN: [id number]   Chief Complaint: weakness and hair loss  HPI  Patient comes with frequent complaint. She is c/o hair loss and weakness. She has had test run multiple times and can never find a reason for these issues. Her last TSH was normal and anemia panel was normal. This has been going on for several years. She sees dermatology for her hair loss and has tried multiple meds which she does not like to take meds. She gets frequent perms in her hair but does not think that is a problem. He always has fatigue and just does not feel like doing anything. She does no exercise.   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   Depression, recurrent (HCC) 05/26/2018   History of adenomatous polyp of colon 12/25/2017   Internal hemorrhoids 12/25/2017   Dense breast tissue on mammogram 07/18/2017   Acquired hypothyroidism 12/10/2016   At high risk for breast cancer 09/30/2016   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   Fatigue 01/10/2014   Irritable bowel syndrome with constipation 01/10/2014   Painful lumpy right breast 09/05/2013   Smoker 07/09/2013   Fibromyalgia 05/24/2013   Migraine with status migrainosus 05/24/2013   Morton's neuroma of right foot 05/24/2013   Pain in joint involving lower leg 05/24/2013   Lesion of plantar nerve 05/24/2013   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  Negative for activity change, appetite change, diaphoresis and unexpected weight change.  Eyes:  Negative for pain.  Respiratory:  Negative for shortness of breath.   Cardiovascular:  Negative for chest pain, palpitations and leg swelling.  Gastrointestinal:  Negative for abdominal pain.  Endocrine: Negative for polydipsia.  Skin:  Negative for rash.       Hair loss  Neurological:  Negative for dizziness, weakness and headaches.  Hematological:  Does not bruise/bleed easily.  All other systems reviewed and are negative.      Objective:   Physical Exam Constitutional:      Appearance: Normal appearance.  Cardiovascular:     Rate and Rhythm: Normal rate and regular rhythm.     Heart sounds: Normal heart sounds.  Pulmonary:     Effort: Pulmonary effort is normal.     Breath sounds: Normal breath sounds.  Skin:    General: Skin is warm.  Neurological:     General: No focal deficit present.     Mental Status: She is alert and oriented to person, place, and time.  Psychiatric:        Mood and Affect: Mood normal.        Behavior: Behavior normal.     BP 94/62   Pulse 84   Temp 97.9 F (36.6 C) (Temporal)   Ht 5\' 4"  (1.626 m)   Wt 129 lb (58.5 kg)   BMI 22.14 kg/m  Assessment & Plan:   Ruth Hopkins in today with chief complaint of Fatigue and Alopecia (Wants urine checked. Concerned that hemoglobin is low)   1. Fatigue, unspecified type (Primary) Labs pending Exercise routine encouraged - Urinalysis, Routine w reflex microscopic - Urine Culture - CBC with Differential/Platelet - CMP14+EGFR - Thyroid Panel With TSH - Vitamin B12 - VITAMIN D 25 Hydroxy (Vit-D Deficiency, Fractures)  2. Hair loss Labs pending - Thyroid Panel With TSH    The above assessment and management plan was discussed with the patient. The patient verbalized understanding of and has agreed to the management plan. Patient is aware to call the clinic if symptoms persist or worsen.  Patient is aware when to return to the clinic for a follow-up visit. Patient educated on when it is appropriate to go to the emergency department.   [name], FNP

## 2023-12-19 NOTE — Patient Instructions (Signed)
 Hair Loss (Alopecia Areata) in Adults: What to Know Alopecia areata is a condition that causes hair loss. It may cause you to lose hair on your scalp in patches. Sometimes, you may lose all the hair on your scalp or all the hair on your face and body. Alopecia areata can be hard on your emotions, but it isn't dangerous. Alopecia areata is an autoimmune disease. This means that your body's defense system (immune system): Mistakes normal parts of your body for things like germs that can make you sick. Attacks your hair follicles. These are openings in your skin where hair grows. What are the causes? The cause of alopecia areata isn't known. What increases the risk? You're more likely to get alopecia areata if you have: A family history of alopecia. A family or personal history of another autoimmune disease, such as: Type 1 diabetes. Thyroid autoimmune disease. Eczema, asthma, and allergies. Down syndrome. What are the signs or symptoms? The main symptom of alopecia areata is round spots of patchy hair loss on your scalp. The spots may itch a little. Other symptoms include: Exclamation point hairs. These are short, dark hairs in your bald patches that are wider at the top. Dents, white spots, or lines in your fingernails or toenails. Balding and loss of body hair. These are rare. Alopecia areata often starts in childhood, but it can start at any age. For some people, hair grows back on its own and hair loss doesn't happen again. For others, hair may fall out and grow back in cycles. The hair loss may last many years. How is this diagnosed? Alopecia areata is diagnosed based on your symptoms and family history. Your health care provider will also check the skin on your scalp, teeth, and nails. They may refer you to an expert in hair and skin disorders (dermatologist). You may also have tests, such as: A hair pull test. Blood tests or other screening tests. These check for autoimmune diseases,  such as thyroid disease or diabetes. Skin biopsy. This is when a small piece of skin is removed for testing. Dermoscopy. This is a procedure to look at your skin with a lighted magnifying tool. How is this treated? There's no cure for alopecia areata. Treatment is done to help get hair to grow again and keep the immune system from reacting too much. No one treatment is right for all people with alopecia areata. It depends on the type of hair loss you have and how severe it is. Work with your provider to find the best treatment for you. Treatment may include: Regular checkups to make sure your hair loss isn't getting worse. This is called watchful waiting. Using steroid creams or pills for 6-8 weeks. These help stop the immune reaction and help hair grow back faster. Topicalmedicines, which are used on your skin. These help change your immune system response and support your hair growth cycle. Steroid shots. Immunomodulators. These medicines change how your immune system reacts. These may be used in some severe cases. Therapy and counseling with a support group or therapist, if you're having trouble coping with hair loss. Follow these instructions at home: Medicines Use topical creams only as told by your provider. Take your medicines only as told. General instructions Learn as much as you can about your alopecia areata. Try getting a wig or products to make hair look fuller or to cover bald spots. These may help you feel more comfortable with how you look. Get therapy or counseling if you're struggling  with hair loss. Ask your provider to recommend a counselor or support group. Keep all follow-up visits. You'll need regular checkups to make sure your hair loss isn't getting worse. Where to find more information National Alopecia Areata Foundation: naaf.org Contact a health care provider if: Your hair loss gets worse, even with treatment. You have new symptoms. You're struggling with your  feelings about hair loss. Get help right away if: Your hair loss gets worse all of a sudden. This information is not intended to replace advice given to you by your health care provider. Make sure you discuss any questions you have with your health care provider. Document Revised: 02/15/2023 Document Reviewed: 02/15/2023 Elsevier Patient Education  2024 [employer].

## 2023-12-20 LAB — CBC WITH DIFFERENTIAL/PLATELET
Basophils Absolute: 0.1 10*3/uL (ref 0.0–0.2)
Basos: 1 %
EOS (ABSOLUTE): 0.3 10*3/uL (ref 0.0–0.4)
Eos: 5 %
Hematocrit: 44.5 % (ref 34.0–46.6)
Hemoglobin: 14.4 g/dL (ref 11.1–15.9)
Immature Grans (Abs): 0 10*3/uL (ref 0.0–0.1)
Immature Granulocytes: 0 %
Lymphocytes Absolute: 1.7 10*3/uL (ref 0.7–3.1)
Lymphs: 33 %
MCH: 30.1 pg (ref 26.6–33.0)
MCHC: 32.4 g/dL (ref 31.5–35.7)
MCV: 93 fL (ref 79–97)
Monocytes Absolute: 0.3 10*3/uL (ref 0.1–0.9)
Monocytes: 6 %
Neutrophils Absolute: 2.8 10*3/uL (ref 1.4–7.0)
Neutrophils: 55 %
Platelets: 187 10*3/uL (ref 150–450)
RBC: 4.79 x10E6/uL (ref 3.77–5.28)
RDW: 11.8 % (ref 11.7–15.4)
WBC: 5.1 10*3/uL (ref 3.4–10.8)

## 2023-12-20 LAB — VITAMIN B12: Vitamin B-12: 821 pg/mL (ref 232–1245)

## 2023-12-20 LAB — CMP14+EGFR
ALT: 13 IU/L (ref 0–32)
AST: 21 IU/L (ref 0–40)
Albumin: 4.3 g/dL (ref 3.8–4.9)
Alkaline Phosphatase: 65 IU/L (ref 44–121)
BUN/Creatinine Ratio: 37 — ABNORMAL HIGH (ref 9–23)
BUN: 28 mg/dL — ABNORMAL HIGH (ref 6–24)
Bilirubin Total: 0.2 mg/dL (ref 0.0–1.2)
CO2: 24 mmol/L (ref 20–29)
Calcium: 9.3 mg/dL (ref 8.7–10.2)
Chloride: 103 mmol/L (ref 96–106)
Creatinine, Ser: 0.75 mg/dL (ref 0.57–1.00)
Globulin, Total: 2.7 g/dL (ref 1.5–4.5)
Glucose: 78 mg/dL (ref 70–99)
Potassium: 4.3 mmol/L (ref 3.5–5.2)
Sodium: 140 mmol/L (ref 134–144)
Total Protein: 7 g/dL (ref 6.0–8.5)
eGFR: 92 mL/min/{1.73_m2} (ref >59)

## 2023-12-20 LAB — THYROID PANEL WITH TSH
Free Thyroxine Index: 2.4 (ref 1.2–4.9)
T3 Uptake Ratio: 31 % (ref 24–39)
T4, Total: 7.9 ug/dL (ref 4.5–12.0)
TSH: 1.42 u[IU]/mL (ref 0.450–4.500)

## 2023-12-20 LAB — VITAMIN D 25 HYDROXY (VIT D DEFICIENCY, FRACTURES): Vit D, 25-Hydroxy: 70.8 ng/mL (ref 30.0–100.0)

## 2023-12-21 DIAGNOSIS — M23011 Cystic meniscus, anterior horn of medial meniscus, right knee: Secondary | ICD-10-CM | POA: Diagnosis not present

## 2023-12-21 LAB — URINE CULTURE

## 2023-12-22 ENCOUNTER — Telehealth: Payer: Self-pay | Admitting: Family

## 2023-12-22 NOTE — Telephone Encounter (Signed)
 Pt given/reviewed lab results per notes of Bennie Pierini , FNP from 12/20/23 and 12/22/23 on 12/22/23. Pt verbalized understanding kidney function stable. Patient reports she will f/u at next OV 01/17/24 for any further questions and would like to know if there if anything PCP recommended to do to help labs return to normal .       Copied from CRM #[id number]. Topic: Clinical - Lab/Test Results >> Dec 22, 2023  2:33 PM Ruth Hopkins wrote: Reason for CRM: The patient is requesting a call back regarding her lab results. I read the note to her verbatim. However, she is concerned about her kidney function as she states it was out of range when she reviewed it on MyChart. The patient's call back number is [phone].

## 2023-12-22 NOTE — Telephone Encounter (Signed)
 All lab work is stable at this time. No further recommendations.

## 2023-12-22 NOTE — Telephone Encounter (Signed)
 Patient aware and verbalized understanding.

## 2023-12-28 ENCOUNTER — Ambulatory Visit: Admitting: Family Medicine

## 2023-12-28 ENCOUNTER — Encounter: Payer: Self-pay | Admitting: Family Medicine

## 2023-12-28 ENCOUNTER — Ambulatory Visit: Payer: Self-pay

## 2023-12-28 DIAGNOSIS — G8921 Chronic pain due to trauma: Secondary | ICD-10-CM

## 2023-12-28 DIAGNOSIS — S1980XS Other specified injuries of unspecified part of neck, sequela: Secondary | ICD-10-CM

## 2023-12-28 MED ORDER — METHOCARBAMOL 500 MG PO TABS: 500.0000 mg | ORAL_TABLET | Freq: Four times a day (QID) | ORAL | 0 refills | Status: DC

## 2023-12-28 MED ORDER — PREDNISONE 10 MG (21) PO TBPK
ORAL_TABLET | ORAL | 0 refills | Status: DC
Start: 2023-12-28 — End: 2024-01-17
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
## 2023-12-28 NOTE — Patient Instructions (Signed)
 Cervical Strain and Sprain Rehab Ask your health care provider which exercises are safe for you. Do exercises exactly as told by your health care provider and adjust them as directed. It is normal to feel mild stretching, pulling, tightness, or discomfort as you do these exercises. Stop right away if you feel sudden pain or your pain gets worse. Do not begin these exercises until told by your health care provider. Stretching and range-of-motion exercises Cervical side bending  Using good posture, sit on a stable chair or stand up. Without moving your shoulders, slowly tilt your left / right ear to your shoulder until you feel a stretch in the neck muscles on the opposite side. You should be looking straight ahead. Hold for __________ seconds. Repeat with the other side of your neck. Repeat __________ times. Complete this exercise __________ times a day. Cervical rotation  Using good posture, sit on a stable chair or stand up. Slowly turn your head to the side as if you are looking over your left / right shoulder. Keep your eyes level with the ground. Stop when you feel a stretch along the side and the back of your neck. Hold for __________ seconds. Repeat this by turning to your other side. Repeat __________ times. Complete this exercise __________ times a day. Thoracic extension and pectoral stretch  Roll a towel or a small blanket so it is about 4 inches (10 cm) in diameter. Lie down on your back on a firm surface. Put the towel in the middle of your back across your spine. It should not be under your shoulder blades. Put your hands behind your head and let your elbows fall out to your sides. Hold for __________ seconds. Repeat __________ times. Complete this exercise __________ times a day. Strengthening exercises Upper cervical flexion  Lie on your back with a thin pillow behind your head or a small, rolled-up towel under your neck. Gently tuck your chin toward your chest and nod  your head down to look toward your feet. Do not lift your head off the pillow. Hold for __________ seconds. Release the tension slowly. Relax your neck muscles completely before you repeat this exercise. Repeat __________ times. Complete this exercise __________ times a day. Cervical extension  Stand about 6 inches (15 cm) away from a wall, with your back facing the wall. Place a soft object, about 6-8 inches (15-20 cm) in diameter, between the back of your head and the wall. A soft object could be a small pillow, a ball, or a folded towel. Gently tilt your head back and press into the soft object. Keep your jaw and forehead relaxed. Hold for __________ seconds. Release the tension slowly. Relax your neck muscles completely before you repeat this exercise. Repeat __________ times. Complete this exercise __________ times a day. Posture and body mechanics Body mechanics refer to the movements and positions of your body while you do your daily activities. Posture is part of body mechanics. Good posture and healthy body mechanics can help to relieve stress in your body's tissues and joints. Good posture means that your spine is in its natural S-curve position (your spine is neutral), your shoulders are pulled back slightly, and your head is not tipped forward. The following are general guidelines for using improved posture and body mechanics in your everyday activities. Sitting  When sitting, keep your spine neutral and keep your feet flat on the floor. Use a footrest, if needed, and keep your thighs parallel to the floor. Avoid rounding  your shoulders. Avoid tilting your head forward. When working at a desk or a computer, keep your desk at a height where your hands are slightly lower than your elbows. Slide your chair under your desk so you are close enough to maintain good posture. When working at a computer, place your monitor at a height where you are looking straight ahead and you do not have to  tilt your head forward or downward to look at the screen. Standing  When standing, keep your spine neutral and keep your feet about hip-width apart. Keep a slight bend in your knees. Your ears, shoulders, and hips should line up. When you do a task in which you stand in one place for a long time, place one foot up on a stable object that is 2-4 inches (5-10 cm) high, such as a footstool. This helps keep your spine neutral. Resting When lying down and resting, avoid positions that are most painful for you. Try to support your neck in a neutral position. You can use a contour pillow or a small rolled-up towel. Your pillow should support your neck but not push on it. This information is not intended to replace advice given to you by your health care provider. Make sure you discuss any questions you have with your health care provider. Document Revised: 01/10/2023 Document Reviewed: 03/29/2022 Elsevier Patient Education  2024 [employer].
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
## 2024-01-05 ENCOUNTER — Other Ambulatory Visit: Payer: Self-pay | Admitting: Family

## 2024-01-05 ENCOUNTER — Encounter: Payer: Self-pay | Admitting: Family

## 2024-01-05 ENCOUNTER — Telehealth (INDEPENDENT_AMBULATORY_CARE_PROVIDER_SITE_OTHER): Admitting: Family

## 2024-01-05 ENCOUNTER — Telehealth: Payer: Self-pay

## 2024-01-05 DIAGNOSIS — M542 Cervicalgia: Secondary | ICD-10-CM

## 2024-01-05 MED ORDER — HYDROCODONE-ACETAMINOPHEN 5-325 MG PO TABS: 0.5000 | ORAL_TABLET | Freq: Four times a day (QID) | ORAL | 0 refills | Status: DC | PRN

## 2024-01-05 NOTE — Telephone Encounter (Signed)
 Can anything be given for this?

## 2024-01-05 NOTE — Telephone Encounter (Signed)
 Appointment scheduled with PCP

## 2024-01-05 NOTE — Progress Notes (Signed)
 Virtual Visit Consent   Ruth Hopkins, you are scheduled for a virtual visit with a [facility] provider today. Just as with appointments in the office, your consent must be obtained to participate. Your consent will be active for this visit and any virtual visit you may have with one of our providers in the next 365 days. If you have a MyChart account, a copy of this consent can be sent to you electronically.  As this is a virtual visit, video technology does not allow for your provider to perform a traditional examination. This may limit your provider's ability to fully assess your condition. If your provider identifies any concerns that need to be evaluated in person or the need to arrange testing (such as labs, EKG, etc.), we will make arrangements to do so. Although advances in technology are sophisticated, we cannot ensure that it will always work on either your end or our end. If the connection with a video visit is poor, the visit may have to be switched to a telephone visit. With either a video or telephone visit, we are not always able to ensure that we have a secure connection.  By engaging in this virtual visit, you consent to the provision of healthcare and authorize for your insurance to be billed (if applicable) for the services provided during this visit. Depending on your insurance coverage, you may receive a charge related to this service.  I need to obtain your verbal consent now. Are you willing to proceed with your visit today? MEIGHAN TRETO has provided verbal consent on 01/05/2024 for a virtual visit (video or telephone). Tommas Fragmin, FNP  Date: 01/05/2024 2:34 PM   Virtual Visit via Video Note   I, Tommas Fragmin, connected with  Ruth Hopkins  ([id number], December 20, 1965) on 01/05/24 at  3:55 PM EDT by a video-enabled telemedicine application and verified that I am speaking with the correct person using two identifiers.  Location: Patient: Virtual Visit Location Patient: Other:  car Provider: Virtual Visit Location Provider: Home Office   I discussed the limitations of evaluation and management by telemedicine and the availability of in person appointments. The patient expressed understanding and agreed to proceed.    History of Present Illness: Ruth Hopkins is a 58 y.o. who identifies as a female who was assigned female at birth, and is being seen today for neck pain that started over a week. She was seen on 12/28/23 and given prednisone with mild relief.   HPI: Neck Pain  This is a new problem. The current episode started 1 to 4 weeks ago. The problem occurs constantly. The problem has been unchanged. The pain is present in the left side, midline, right side and occipital region. The quality of the pain is described as aching. The pain is at a severity of 8/10. The pain is moderate. The symptoms are aggravated by twisting and bending. Associated symptoms include headaches. Pertinent negatives include no leg pain, numbness, pain with swallowing, paresis or photophobia. She has tried bed rest and NSAIDs (robaxin) for the symptoms. The treatment provided mild relief.    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   Depression, recurrent (HCC) 05/26/2018   History of adenomatous polyp of colon 12/25/2017   Internal hemorrhoids 12/25/2017   Dense breast tissue on mammogram 07/18/2017   Acquired hypothyroidism 12/10/2016   At high risk for breast cancer 09/30/2016   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   Fatigue 01/10/2014   Irritable bowel syndrome with constipation  01/10/2014   Painful lumpy right breast 09/05/2013   Smoker 07/09/2013   Fibromyalgia 05/24/2013   Migraine with status migrainosus 05/24/2013   Morton's neuroma of right foot 05/24/2013   Pain in joint involving lower leg 05/24/2013   Lesion of plantar nerve 05/24/2013   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    Allergies:  Allergies  Allergen Reactions   Latex Itching and Rash   Codeine    Gabapentin     "fuzzy"   Sulfur Itching   Sulfa Antibiotics Hives, Itching and Rash   Medications:  Current Outpatient Medications:    HYDROcodone-acetaminophen (NORCO/VICODIN) 5-325 MG tablet, Take 0.5-1 tablets by mouth every 6 (six) hours as needed for moderate pain (pain score 4-6)., Disp: 10 tablet, Rfl: 0   acetaminophen (TYLENOL) 500 MG tablet, Take by mouth., Disp: , Rfl:    clonazePAM (KLONOPIN) 0.5 MG tablet, Take 1 tablet (0.5 mg total) by mouth 2 (two) times daily., Disp: 30 tablet, Rfl: 2   cycloSPORINE (RESTASIS) 0.05 % ophthalmic emulsion, Restasis 0.05 % eye drops in a dropperette  USE 1 DROP INTO BOTH EYES TWICE A DAY, Disp: , Rfl:    diclofenac (VOLTAREN) 75 MG EC tablet, Take 1 tablet (75 mg total) by mouth 2 (two) times daily. (Patient not taking: Reported on 12/28/2023), Disp: 30 tablet, Rfl: 2   eletriptan (RELPAX) 40 MG tablet, TAKE 1 TABLET BY MOUTH EVERY 2 HOURS AS NEEDED FOR MIGRAINE HEADACHE . DO NOT EXCEED 6 PER 24 HOURS, Disp: 12 tablet, Rfl: 2   estradiol (VIVELLE-DOT) 0.075 MG/24HR, Place 1 patch onto the skin 2 (two) times a week., Disp: , Rfl:    finasteride (PROSCAR) 5 MG tablet, Take by mouth., Disp: , Rfl:    Fluocinolone Acetonide (FLAC) 0.01 % OIL, Place 1 drop in ear(s) daily. Place 1-2 drop in right ear, Disp: 20 mL, Rfl: 0   fluticasone (FLONASE) 50 MCG/ACT nasal spray, Place 2 sprays into both nostrils daily., Disp: 16 g, Rfl: 6   imipramine (TOFRANIL) 10 MG tablet, Take 3 tablets (30 mg total) by mouth at bedtime., Disp: 90 tablet,  Rfl: 11   levothyroxine (SYNTHROID) 50 MCG tablet, TAKE 1 TABLET BY MOUTH ONCE DAILY BEFORE BREAKFAST, Disp: 90 tablet, Rfl: 3   methocarbamol (ROBAXIN) 500 MG tablet, Take 1 tablet (500 mg total) by mouth 4 (four) times daily., Disp: 20 tablet, Rfl: 0   mometasone (NASONEX) 50 MCG/ACT nasal spray, PLACE 2 SPRAYS INTO THE NOSE DAILY., Disp: 51 each, Rfl: 1   Multiple Vitamins-Minerals (MULTIVITAMIN PO), Take by mouth., Disp: , Rfl:    omeprazole (PRILOSEC) 20 MG capsule, Take 1 capsule by mouth once daily, Disp: 90 capsule, Rfl: 0   ondansetron (ZOFRAN) 4 MG tablet, Take 1 tablet (4 mg total) by mouth every 8 (eight) hours as needed for nausea or vomiting., Disp: 20 tablet, Rfl: 0   predniSONE (STERAPRED UNI-PAK 21 TAB) 10 MG (21) TBPK tablet, Use as directed on back of pill pack, Disp: 21 tablet, Rfl: 0   tacrolimus (PROTOPIC) 0.1 % ointment, tacrolimus  0.1 % topical ointment  APPLY OINTMENT TOPICALLY TO AFFECTED AREA TWICE DAILY, Disp: , Rfl:    valACYclovir (VALTREX) 500 MG tablet, Take 500 mg by mouth daily as needed., Disp: , Rfl:   Observations/Objective: Patient is well-developed, well-nourished in no acute distress.  Resting comfortably Head is normocephalic, atraumatic.  No labored breathing.  Speech is clear and coherent with logical content.  Patient is alert and oriented at baseline.  Pain in neck with flexion, extension, and rotation  Assessment and Plan: 1. Neck pain, acute (Primary) - HYDROcodone-acetaminophen (NORCO/VICODIN) 5-325 MG tablet; Take 0.5-1 tablets by mouth every 6 (six) hours as needed for moderate pain (pain score 4-6).  Dispense: 10 tablet; Refill: 0  Rest ROM exercises  Take Norco  1/2 tab, do not take klonopin. Only take if pain is >8 Continue prednisone  Robaxin as needed- sedation precautions discussed Keep chronic follow up  Follow Up Instructions: I discussed the assessment and treatment plan with the patient. The patient was provided an  opportunity to ask questions and all were answered. The patient agreed with the plan and demonstrated an understanding of the instructions.  A copy of instructions were sent to the patient via MyChart unless otherwise noted below.     The patient was advised to call back or seek an in-person evaluation if the symptoms worsen or if the condition fails to improve as anticipated.    Tommas Fragmin, FNP

## 2024-01-05 NOTE — Telephone Encounter (Signed)
 Copied from CRM [id number]. Topic: Clinical - Medication Question >> Jan 05, 2024 12:14 PM [name] wrote: Reason for CRM: Patient is calling in because she wants to know if her provider can send in some vicodin for pain. She was already seen in office for pain in her neck,  back and top of head and shoulder, and she has already taken prednisone and it's not helping. Patient is requesting a call back.

## 2024-01-16 ENCOUNTER — Encounter: Payer: Self-pay | Admitting: Family

## 2024-01-17 ENCOUNTER — Ambulatory Visit (INDEPENDENT_AMBULATORY_CARE_PROVIDER_SITE_OTHER): Payer: Medicare HMO | Admitting: Family

## 2024-01-17 ENCOUNTER — Encounter: Payer: Self-pay | Admitting: Family

## 2024-01-17 VITALS — BP 109/72 | HR 73 | Temp 98.0°F | Ht 64.0 in | Wt 130.0 lb

## 2024-01-17 DIAGNOSIS — K219 Gastro-esophageal reflux disease without esophagitis: Secondary | ICD-10-CM

## 2024-01-17 DIAGNOSIS — J439 Emphysema, unspecified: Secondary | ICD-10-CM

## 2024-01-17 DIAGNOSIS — R300 Dysuria: Secondary | ICD-10-CM | POA: Diagnosis not present

## 2024-01-17 DIAGNOSIS — F339 Major depressive disorder, recurrent, unspecified: Secondary | ICD-10-CM | POA: Diagnosis not present

## 2024-01-17 DIAGNOSIS — K581 Irritable bowel syndrome with constipation: Secondary | ICD-10-CM | POA: Diagnosis not present

## 2024-01-17 DIAGNOSIS — E785 Hyperlipidemia, unspecified: Secondary | ICD-10-CM | POA: Diagnosis not present

## 2024-01-17 DIAGNOSIS — E039 Hypothyroidism, unspecified: Secondary | ICD-10-CM | POA: Diagnosis not present

## 2024-01-17 DIAGNOSIS — G43901 Migraine, unspecified, not intractable, with status migrainosus: Secondary | ICD-10-CM

## 2024-01-17 DIAGNOSIS — F17200 Nicotine dependence, unspecified, uncomplicated: Secondary | ICD-10-CM

## 2024-01-17 DIAGNOSIS — F419 Anxiety disorder, unspecified: Secondary | ICD-10-CM | POA: Diagnosis not present

## 2024-01-17 DIAGNOSIS — M797 Fibromyalgia: Secondary | ICD-10-CM

## 2024-01-17 LAB — URINALYSIS, COMPLETE
Bilirubin, UA: NEGATIVE
Glucose, UA: NEGATIVE
Ketones, UA: NEGATIVE
Leukocytes,UA: NEGATIVE
Nitrite, UA: NEGATIVE
Protein,UA: NEGATIVE
Specific Gravity, UA: <1.005 — ABNORMAL LOW (ref 1.005–1.030)
Urobilinogen, Ur: 0.2 mg/dL (ref 0.2–1.0)
pH, UA: 6 (ref 5.0–7.5)

## 2024-01-17 LAB — MICROSCOPIC EXAMINATION
Bacteria, UA: NONE SEEN
Renal Epithel, UA: NONE SEEN /HPF
WBC, UA: NONE SEEN /HPF (ref 0–5)
Yeast, UA: NONE SEEN

## 2024-01-17 MED ORDER — CLONAZEPAM 0.5 MG PO TABS: 0.5000 mg | ORAL_TABLET | Freq: Two times a day (BID) | ORAL | 2 refills | Status: AC

## 2024-01-17 NOTE — Patient Instructions (Signed)

## 2024-01-17 NOTE — Progress Notes (Signed)
 Subjective:    Patient ID: Ruth Hopkins, female    DOB: December 03, 1965, 58 y.o.   MRN: [id number]  Chief Complaint  Patient presents with   Medical Management of Chronic Issues   Nocturia   PT presents to the office today for  chronic follow up.    She is followed by psychiatrist 3 months for hot flashes and GAD.She is currently taking estradiol  patch and gradually decreasing and trying to add other medications. She has tried gabapentin , Lexapro , and Effexor, and wellbutrin  without success.   She is complaining of hair loss.    She has fibromyalgia and states she gets neck pain that causes her migraines. She is followed by GYN annually   She is followed by GI she had repeat colonoscopy 07/15/23 with biopsy.   Has emphysema and continues to smoke 1/2 pack. Denies any SOB.  Migraine  This is a chronic problem. The current episode started more than 1 year ago. Episode frequency: 20 times a month with her neck pain. The problem has been unchanged. The pain is located in the Occipital and frontal region. The pain quality is similar to prior headaches. Associated symptoms include back pain. The symptoms are aggravated by emotional stress. She has tried triptans for the symptoms. The treatment provided mild relief.  Gastroesophageal Reflux She complains of belching and heartburn. This is a chronic problem. The current episode started more than 1 year ago. The problem occurs frequently. The symptoms are aggravated by smoking. Associated symptoms include fatigue. She has tried a PPI and an antacid for the symptoms. The treatment provided moderate relief.  Constipation This is a chronic problem. The current episode started more than 1 year ago. The problem has been resolved since onset. Her stool frequency is 2 to 3 times per week. Associated symptoms include back pain. She has tried diet changes and laxatives for the symptoms. The treatment provided moderate relief.  Thyroid  Problem Presents for  follow-up visit. Symptoms include anxiety, constipation, fatigue and hair loss. The symptoms have been stable.  Hyperlipidemia This is a chronic problem. The current episode started more than 1 year ago. The problem is uncontrolled. Recent lipid tests were reviewed and are high. Current antihyperlipidemic treatment includes diet change. The current treatment provides mild improvement of lipids. Risk factors for coronary artery disease include dyslipidemia, hypertension and a sedentary lifestyle.  Back Pain This is a chronic problem. The current episode started more than 1 year ago. The problem occurs intermittently. The problem has been waxing and waning since onset. The pain is present in the thoracic spine and lumbar spine. The quality of the pain is described as aching. The pain is at a severity of 7/10. The pain is moderate. She has tried muscle relaxant for the symptoms. The treatment provided moderate relief.  Depression        This is a chronic problem.  The current episode started more than 1 year ago.   The problem occurs intermittently.  Associated symptoms include fatigue, helplessness, hopelessness, restlessness and sad.  Past treatments include nothing.  Past medical history includes thyroid  problem and anxiety.   Anxiety Presents for follow-up visit. Symptoms include excessive worry, nervous/anxious behavior and restlessness. Symptoms occur most days. The severity of symptoms is mild.    Urinary Frequency  This is a new problem. The current episode started in the past 7 days. The problem occurs every urination. The problem has been unchanged. The pain is at a severity of 0/10. The patient is  experiencing no pain. Associated symptoms include frequency. She has tried increased fluids for the symptoms. The treatment provided mild relief.      Review of Systems  Constitutional:  Positive for fatigue.  Gastrointestinal:  Positive for constipation and heartburn.  Genitourinary:  Positive  for frequency.  Musculoskeletal:  Positive for back pain.  Psychiatric/Behavioral:  The patient is nervous/anxious.   All other systems reviewed and are negative.  Family History  Problem Relation Age of Onset   Asthma Mother    Hypothyroidism Mother    Hypertension Mother    Raynaud syndrome Mother    Hypertension Father    Social History   Socioeconomic History   Marital status: Married    Spouse name: Not on file   Number of children: 0   Years of education: Not on file   Highest education level: Associate degree: occupational, Scientist, product/process development, or vocational program  Occupational History   Occupation: disability  Tobacco Use   Smoking status: Every Day    Current packs/day: 1.00    Average packs/day: 1 pack/day for 46.2 years (46.2 ttl pk-yrs)    Types: Cigarettes    Start date: 11/02/1977   Smokeless tobacco: Never   Tobacco comments:    Smoking since age 75 - 1ppd usually - has tried to quit several times - is afraid it will make her gain weight    Started smoking again a few weeks ago, maybe 15 ciggs per day --12/16/2022  Vaping Use   Vaping status: Never Used  Substance and Sexual Activity   Alcohol use: No   Drug use: No   Sexual activity: Not on file  Other Topics Concern   Not on file  Social History Narrative   Lives home with husband in one level home   Her mother and sister live nearby   [place] faith   Social Drivers of Health   Financial Resource Strain: Low Risk  (01/16/2024)   Overall Financial Resource Strain (CARDIA)    Difficulty of Paying Living Expenses: Not very hard  Recent Concern: Financial Resource Strain - Medium Risk (10/18/2023)   Received from [facility]   Overall Financial Resource Strain (CARDIA)    Difficulty of Paying Living Expenses: Somewhat hard  Food Insecurity: No Food Insecurity (01/16/2024)   Hunger Vital Sign    Worried About Running Out of Food in the Last Year: Never true    Ran Out of Food in the Last Year: Never  true  Transportation Needs: No Transportation Needs (01/16/2024)   PRAPARE - Administrator, Civil Service (Medical): No    Lack of Transportation (Non-Medical): No  Physical Activity: Inactive (01/16/2024)   Exercise Vital Sign    Days of Exercise per Week: 0 days    Minutes of Exercise per Session: 0 min  Stress: Stress Concern Present (01/16/2024)   [employer] of Occupational Health - Occupational Stress Questionnaire    Feeling of Stress : To some extent  Social Connections: Socially Integrated (01/16/2024)   Social Connection and Isolation Panel [NHANES]    Frequency of Communication with Friends and Family: More than three times a week    Frequency of Social Gatherings with Friends and Family: Twice a week    Attends Religious Services: More than 4 times per year    Active Member of [employer] or Organizations: Yes    Attends Engineer, structural: More than 4 times per year    Marital Status: Married  Objective:   Physical Exam Vitals reviewed.  Constitutional:      General: She is not in acute distress.    Appearance: She is well-developed.  HENT:     Head: Normocephalic and atraumatic.     Right Ear: Tympanic membrane normal.     Left Ear: Tympanic membrane normal.  Eyes:     Pupils: Pupils are equal, round, and reactive to light.  Neck:     Thyroid : No thyromegaly.  Cardiovascular:     Rate and Rhythm: Normal rate and regular rhythm.     Heart sounds: Normal heart sounds. No murmur heard. Pulmonary:     Effort: Pulmonary effort is normal. No respiratory distress.     Breath sounds: Normal breath sounds. No wheezing.  Abdominal:     General: Bowel sounds are normal. There is no distension.     Palpations: Abdomen is soft.     Tenderness: There is no abdominal tenderness.  Musculoskeletal:        General: No tenderness. Normal range of motion.     Cervical back: Normal range of motion and neck supple.  Skin:    General: Skin is warm and  dry.  Neurological:     Mental Status: She is alert and oriented to person, place, and time.     Cranial Nerves: No cranial nerve deficit.     Deep Tendon Reflexes: Reflexes are normal and symmetric.  Psychiatric:        Behavior: Behavior normal.        Thought Content: Thought content normal.        Judgment: Judgment normal.       BP 109/72   Pulse 73   Temp 98 F (36.7 C)   Ht 5\' 4"  (1.626 m)   Wt 130 lb (59 kg)   SpO2 97%   BMI 22.31 kg/m      Assessment & Plan:   GERLENE CAUGHRON comes in today with chief complaint of Medical Management of Chronic Issues and Nocturia   Diagnosis and orders addressed:  1. Dysuria - Urinalysis, Complete - CMP14+EGFR - CBC with Differential/Platelet  2. Fibromyalgia - CMP14+EGFR - CBC with Differential/Platelet  3. Migraine with status migrainosus, not intractable, unspecified migraine type - CMP14+EGFR - CBC with Differential/Platelet  4. Acquired hypothyroidism (Primary) - CMP14+EGFR - CBC with Differential/Platelet - TSH  5. Irritable bowel syndrome with constipation - CMP14+EGFR - CBC with Differential/Platelet  6. Smoker  - CMP14+EGFR - CBC with Differential/Platelet  7. Depression, recurrent (HCC) - CMP14+EGFR - CBC with Differential/Platelet  8. Anxiety  - CMP14+EGFR - CBC with Differential/Platelet - clonazePAM  (KLONOPIN ) 0.5 MG tablet; Take 1 tablet (0.5 mg total) by mouth 2 (two) times daily.  Dispense: 30 tablet; Refill: 2  9. Gastroesophageal reflux disease, unspecified whether esophagitis present - CMP14+EGFR - CBC with Differential/Platelet  10. Hyperlipidemia, unspecified hyperlipidemia type - CMP14+EGFR - CBC with Differential/Platelet  11. Pulmonary emphysema, unspecified emphysema type (HCC) - CMP14+EGFR - CBC with Differential/Platelet  Labs pending Patient reviewed in [place] controlled database, no flags noted. Contract and drug screen are up to date.  Continue current medications   Keep follow up with specialist  Health Maintenance reviewed Diet and exercise encouraged  Follow up plan: 3 months   Tommas Fragmin, FNP

## 2024-01-17 NOTE — Addendum Note (Signed)
 Addended by: Tommas Fragmin A on: 01/17/2024 11:49 AM   Modules accepted: Orders

## 2024-01-18 LAB — TSH: TSH: 2.27 u[IU]/mL (ref 0.450–4.500)

## 2024-01-18 LAB — LIPID PANEL
Chol/HDL Ratio: 3.1 ratio (ref 0.0–4.4)
Cholesterol, Total: 237 mg/dL — ABNORMAL HIGH (ref 100–199)
HDL: 77 mg/dL (ref >39)
LDL Chol Calc (NIH): 147 mg/dL — ABNORMAL HIGH (ref 0–99)
Triglycerides: 75 mg/dL (ref 0–149)
VLDL Cholesterol Cal: 13 mg/dL (ref 5–40)

## 2024-01-18 LAB — CMP14+EGFR
ALT: 16 IU/L (ref 0–32)
AST: 18 IU/L (ref 0–40)
Albumin: 4.2 g/dL (ref 3.8–4.9)
Alkaline Phosphatase: 51 IU/L (ref 44–121)
BUN/Creatinine Ratio: 32 — ABNORMAL HIGH (ref 9–23)
BUN: 23 mg/dL (ref 6–24)
Bilirubin Total: 0.3 mg/dL (ref 0.0–1.2)
CO2: 21 mmol/L (ref 20–29)
Calcium: 9 mg/dL (ref 8.7–10.2)
Chloride: 103 mmol/L (ref 96–106)
Creatinine, Ser: 0.72 mg/dL (ref 0.57–1.00)
Globulin, Total: 2.3 g/dL (ref 1.5–4.5)
Glucose: 78 mg/dL (ref 70–99)
Potassium: 3.9 mmol/L (ref 3.5–5.2)
Sodium: 140 mmol/L (ref 134–144)
Total Protein: 6.5 g/dL (ref 6.0–8.5)
eGFR: 97 mL/min/{1.73_m2} (ref >59)

## 2024-01-18 LAB — CBC WITH DIFFERENTIAL/PLATELET
Basophils Absolute: 0.1 10*3/uL (ref 0.0–0.2)
Basos: 1 %
EOS (ABSOLUTE): 0.2 10*3/uL (ref 0.0–0.4)
Eos: 4 %
Hematocrit: 43.5 % (ref 34.0–46.6)
Hemoglobin: 13.9 g/dL (ref 11.1–15.9)
Immature Grans (Abs): 0 10*3/uL (ref 0.0–0.1)
Immature Granulocytes: 0 %
Lymphocytes Absolute: 1.6 10*3/uL (ref 0.7–3.1)
Lymphs: 29 %
MCH: 29.9 pg (ref 26.6–33.0)
MCHC: 32 g/dL (ref 31.5–35.7)
MCV: 94 fL (ref 79–97)
Monocytes Absolute: 0.3 10*3/uL (ref 0.1–0.9)
Monocytes: 6 %
Neutrophils Absolute: 3.5 10*3/uL (ref 1.4–7.0)
Neutrophils: 60 %
Platelets: 172 10*3/uL (ref 150–450)
RBC: 4.65 x10E6/uL (ref 3.77–5.28)
RDW: 12.2 % (ref 11.7–15.4)
WBC: 5.7 10*3/uL (ref 3.4–10.8)

## 2024-01-19 ENCOUNTER — Telehealth: Payer: Self-pay

## 2024-01-19 NOTE — Telephone Encounter (Signed)
 Copied from CRM [id number]. Topic: Clinical - Lab/Test Results >> Jan 19, 2024  4:05 PM Karole Pacer C wrote: Reason for CRM: Patient would like a call back to discuss her most recent lab results/ Patient's call back # is [phone]

## 2024-01-23 DIAGNOSIS — F3341 Major depressive disorder, recurrent, in partial remission: Secondary | ICD-10-CM | POA: Diagnosis not present

## 2024-01-30 ENCOUNTER — Ambulatory Visit: Payer: Self-pay

## 2024-01-30 NOTE — Telephone Encounter (Signed)
 Copied from CRM [id number]. Topic: Clinical - Lab/Test Results >> Jan 30, 2024  3:00 PM Ethelle Herb L wrote: Reason for CRM: Patient read results on mychart, patient has further questions.  Patient would like to have the nurse from the office to discuss cholesterol medication.

## 2024-01-30 NOTE — Telephone Encounter (Signed)
 Refer to labs

## 2024-01-31 ENCOUNTER — Encounter: Payer: Self-pay | Admitting: Family

## 2024-02-01 DIAGNOSIS — H2513 Age-related nuclear cataract, bilateral: Secondary | ICD-10-CM | POA: Diagnosis not present

## 2024-02-01 DIAGNOSIS — H02885 Meibomian gland dysfunction left lower eyelid: Secondary | ICD-10-CM | POA: Diagnosis not present

## 2024-02-01 DIAGNOSIS — H02882 Meibomian gland dysfunction right lower eyelid: Secondary | ICD-10-CM | POA: Diagnosis not present

## 2024-02-01 DIAGNOSIS — H04123 Dry eye syndrome of bilateral lacrimal glands: Secondary | ICD-10-CM | POA: Diagnosis not present

## 2024-02-01 DIAGNOSIS — H52223 Regular astigmatism, bilateral: Secondary | ICD-10-CM | POA: Diagnosis not present

## 2024-02-01 DIAGNOSIS — H5203 Hypermetropia, bilateral: Secondary | ICD-10-CM | POA: Diagnosis not present

## 2024-02-01 DIAGNOSIS — H524 Presbyopia: Secondary | ICD-10-CM | POA: Diagnosis not present

## 2024-02-08 ENCOUNTER — Ambulatory Visit: Admitting: Podiatry

## 2024-02-08 ENCOUNTER — Encounter: Payer: Self-pay | Admitting: Podiatry

## 2024-02-08 VITALS — Ht 64.0 in | Wt 130.0 lb

## 2024-02-08 DIAGNOSIS — M7752 Other enthesopathy of left foot: Secondary | ICD-10-CM | POA: Diagnosis not present

## 2024-02-08 DIAGNOSIS — M7751 Other enthesopathy of right foot: Secondary | ICD-10-CM

## 2024-02-08 MED ORDER — TRIAMCINOLONE ACETONIDE 10 MG/ML IJ SUSP
10.0000 mg | Freq: Once | INTRAMUSCULAR | Status: AC
  Administered 2024-02-08: 10 mg via INTRA_ARTICULAR

## 2024-02-09 NOTE — Progress Notes (Signed)
 Subjective:   Patient ID: Ruth Hopkins, female   DOB: 58 y.o.   MRN: [id number]   HPI Patient presents stating her ankles of started to hurt again with increased activity but she did very well for about 7 months   ROS      Objective:  Physical Exam  Neurovascular status intact inflammation pain sinus tarsi bilateral ankles     Assessment:  Inflammatory capsulitis sinus tarsi ankle region bilateral     Plan:  H&P sterile prep injected the sinus tarsi bilateral 3 mg Kenalog  5 mg lidocaine applied sterile dressing reappoint routine care

## 2024-02-15 ENCOUNTER — Ambulatory Visit: Admitting: Podiatry

## 2024-02-16 ENCOUNTER — Ambulatory Visit: Admitting: Podiatry

## 2024-02-29 DIAGNOSIS — M25561 Pain in right knee: Secondary | ICD-10-CM | POA: Diagnosis not present

## 2024-02-29 DIAGNOSIS — M23011 Cystic meniscus, anterior horn of medial meniscus, right knee: Secondary | ICD-10-CM | POA: Diagnosis not present

## 2024-02-29 DIAGNOSIS — M797 Fibromyalgia: Secondary | ICD-10-CM | POA: Diagnosis not present

## 2024-03-03 ENCOUNTER — Other Ambulatory Visit: Payer: Self-pay | Admitting: Family

## 2024-03-03 DIAGNOSIS — K219 Gastro-esophageal reflux disease without esophagitis: Secondary | ICD-10-CM

## 2024-03-07 DIAGNOSIS — R140 Abdominal distension (gaseous): Secondary | ICD-10-CM | POA: Diagnosis not present

## 2024-03-08 ENCOUNTER — Ambulatory Visit

## 2024-03-20 ENCOUNTER — Ambulatory Visit: Payer: Self-pay

## 2024-03-20 NOTE — Telephone Encounter (Signed)
 Appt scheduled

## 2024-03-20 NOTE — Telephone Encounter (Signed)
 FYI Only or Action Required?: FYI only for provider.  Patient was last seen in primary care on 01/17/2024 by Lavell Bari LABOR, FNP. Called Nurse Triage reporting Headache and Neck Pain. Symptoms began several days ago. Interventions attempted: OTC medications: pain relief and Rest, hydration, or home remedies. Symptoms are: gradually worsening.  Triage Disposition: See Physician Within 24 Hours  Patient/caregiver understands and will follow disposition?: Yes  Copied from CRM [id number]. Topic: Clinical - Red Word Triage >> Mar 20, 2024 10:43 AM Larissa RAMAN wrote: [facility] that prompted transfer to Nurse Triage: neck pain, shoulder pain, headache Reason for Disposition  [1] MODERATE headache (e.g., interferes with normal activities) AND [2] present > 24 hours AND [3] unexplained  (Exceptions: analgesics not tried, typical migraine, or headache part of viral illness)  Answer Assessment - Initial Assessment Questions 1. LOCATION: Where does it hurt?      [place]  2. ONSET: When did the headache start? (Minutes, hours or days)      Chronic 3. PATTERN: Does the pain come and go, or has it been constant since it started?     Intermittent. But Constant since 730pm 03/19/24 4. SEVERITY: How bad is the pain? and What does it keep you from doing?  (e.g., Scale 1-10; mild, moderate, or severe)   - MILD (1-3): doesn't interfere with normal activities    - MODERATE (4-7): interferes with normal activities or awakens from sleep    - SEVERE (8-10): excruciating pain, unable to do any normal activities        moderate 5. RECURRENT SYMPTOM: Have you ever had headaches before? If Yes, ask: When was the last time? and What happened that time?      Yes 6. CAUSE: What do you think is causing the headache?     Unsure chronic 7. MIGRAINE: Have you been diagnosed with migraine headaches? If Yes, ask: Is this headache similar?      no 8. HEAD INJURY: Has there been any recent injury to the  head?      No 9. OTHER SYMPTOMS: Do you have any other symptoms? (fever, stiff neck, eye pain, sore throat, cold symptoms)     Shoulder  Protocols used: Headache-A-AH

## 2024-03-21 ENCOUNTER — Telehealth: Payer: Self-pay

## 2024-03-21 ENCOUNTER — Ambulatory Visit

## 2024-03-21 ENCOUNTER — Encounter: Payer: Self-pay | Admitting: Family Medicine

## 2024-03-21 VITALS — BP 94/58 | HR 84 | Temp 98.3°F | Ht 64.0 in | Wt 129.0 lb

## 2024-03-21 DIAGNOSIS — G8921 Chronic pain due to trauma: Secondary | ICD-10-CM | POA: Diagnosis not present

## 2024-03-21 DIAGNOSIS — S1980XS Other specified injuries of unspecified part of neck, sequela: Secondary | ICD-10-CM | POA: Diagnosis not present

## 2024-03-21 DIAGNOSIS — R300 Dysuria: Secondary | ICD-10-CM | POA: Diagnosis not present

## 2024-03-21 LAB — URINALYSIS, ROUTINE W REFLEX MICROSCOPIC
Bilirubin, UA: NEGATIVE
Glucose, UA: NEGATIVE
Ketones, UA: NEGATIVE
Leukocytes,UA: NEGATIVE
Nitrite, UA: NEGATIVE
Protein,UA: NEGATIVE
Specific Gravity, UA: 1.02 (ref 1.005–1.030)
Urobilinogen, Ur: 0.2 mg/dL (ref 0.2–1.0)
pH, UA: 6.5 (ref 5.0–7.5)

## 2024-03-21 LAB — MICROSCOPIC EXAMINATION
Bacteria, UA: NONE SEEN
Renal Epithel, UA: NONE SEEN /HPF
WBC, UA: NONE SEEN /HPF (ref 0–5)
Yeast, UA: NONE SEEN

## 2024-03-21 MED ORDER — METHOCARBAMOL 500 MG PO TABS: 500.0000 mg | ORAL_TABLET | Freq: Four times a day (QID) | ORAL | 0 refills | Status: DC

## 2024-03-21 MED ORDER — PREDNISONE 10 MG (21) PO TBPK
ORAL_TABLET | ORAL | 0 refills | Status: DC
Start: 2024-03-21 — End: 2024-04-26

## 2024-03-21 NOTE — Telephone Encounter (Signed)
 Pharmacy Patient Advocate Encounter   Received notification from CoverMyMeds that prior authorization for Methocarbamol  500MG  tablets is required/requested.   Insurance verification completed.   The patient is insured through [facility] .   Per test claim: PA required; PA started via CoverMyMeds. KEY [id number] . Waiting for clinical questions to populate.

## 2024-03-21 NOTE — Progress Notes (Signed)
 Acute Office Visit  Subjective:     Patient ID: Ruth Hopkins, female    DOB: 1966/06/13, 57 y.o.   MRN: [id number]  Chief Complaint  Patient presents with   Dysuria   Neck Pain    Dysuria   Neck Pain    Patient is in today for intermittent dysuria. This has been an ongoing problem, unchanged. No other symptoms.   Increase neck pain for the last few days after a lot of cleaning. Pain is bilateral, radiating up to the back of her head and down her mid back. She has been taking tylenol  and methocarbamol  without relief. Hasn't taken voltaren  lately as it upsets her stomach. Denies numbness, tingling, weakness, changes in vision. In past prednisone  has helped.   Review of Systems  Genitourinary:  Positive for dysuria.  Musculoskeletal:  Positive for neck pain.   As per HPI.     Objective:    BP (!) 94/58   Pulse 84   Temp 98.3 F (36.8 C) (Temporal)   Ht 5' 4 (1.626 m)   Wt 129 lb (58.5 kg)   SpO2 97%   BMI 22.14 kg/m  Wt Readings from Last 3 Encounters:  03/21/24 129 lb (58.5 kg)  02/08/24 130 lb (59 kg)  01/17/24 130 lb (59 kg)      Physical Exam Vitals and nursing note reviewed.  Constitutional:      General: She is not in acute distress.    Appearance: She is not ill-appearing, toxic-appearing or diaphoretic.  Eyes:     Extraocular Movements: Extraocular movements intact.     Pupils: Pupils are equal, round, and reactive to light.  Pulmonary:     Effort: Pulmonary effort is normal. No respiratory distress.  Abdominal:     General: There is no distension.     Tenderness: There is no abdominal tenderness. There is no right CVA tenderness, left CVA tenderness, guarding or rebound.  Musculoskeletal:     Cervical back: Neck supple. No edema, erythema or rigidity. Muscular tenderness present. No spinous process tenderness. Normal range of motion.  Skin:    General: Skin is warm.  Neurological:     General: No focal deficit present.     Mental Status: She  is alert and oriented to person, place, and time.     Cranial Nerves: No cranial nerve deficit.     Motor: No weakness.     Gait: Gait normal.  Psychiatric:        Mood and Affect: Mood normal.        Behavior: Behavior normal.     Urine dipstick shows positive for RBC's.  Micro exam: 0 WBC's per HPF and 0-2 RBC's per HPF.      Assessment & Plan:   Ruth Hopkins was seen today for dysuria and neck pain.  Diagnoses and all orders for this visit:  Dysuria Intermittent, chronic. Negative UA. Culture pending.  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 Prednisone  burst as below. Continue robaxin  and tylenol  prn. No red flag symptoms. -     predniSONE  (STERAPRED UNI-PAK 21 TAB) 10 MG (21) TBPK tablet; Use as directed on back of pill pack -     methocarbamol  (ROBAXIN ) 500 MG tablet; Take 1 tablet (500 mg total) by mouth 4 (four) times daily.  Return to office for new or worsening symptoms, or if symptoms persist.  The patient indicates understanding of these issues and agrees with the plan.  Ruth CHRISTELLA Search, FNP

## 2024-03-25 ENCOUNTER — Ambulatory Visit: Payer: Self-pay | Admitting: Family Medicine

## 2024-03-25 DIAGNOSIS — N3000 Acute cystitis without hematuria: Secondary | ICD-10-CM

## 2024-03-25 LAB — URINE CULTURE

## 2024-03-25 MED ORDER — AMOXICILLIN-POT CLAVULANATE 875-125 MG PO TABS: 1.0000 | ORAL_TABLET | Freq: Two times a day (BID) | ORAL | 0 refills | Status: DC

## 2024-03-26 ENCOUNTER — Telehealth: Payer: Self-pay

## 2024-03-26 NOTE — Telephone Encounter (Signed)
 EXPIRED. New Prior Auth started.

## 2024-03-26 NOTE — Telephone Encounter (Signed)
 Pharmacy Patient Advocate Encounter   Received notification from CoverMyMeds that prior authorization for Methocarbamol  500MG  tablets is required/requested.   Insurance verification completed.   The patient is insured through [facility] .   Per test claim: PA required; PA submitted to above mentioned insurance via CoverMyMeds Key/confirmation #/EOC [id number] Status is pending

## 2024-03-28 ENCOUNTER — Telehealth: Payer: Self-pay

## 2024-03-28 ENCOUNTER — Other Ambulatory Visit: Payer: Self-pay | Admitting: Family Medicine

## 2024-03-28 ENCOUNTER — Ambulatory Visit: Payer: Self-pay

## 2024-03-28 ENCOUNTER — Ambulatory Visit: Payer: Self-pay | Admitting: Family

## 2024-03-28 MED ORDER — CEPHALEXIN 500 MG PO CAPS: 500.0000 mg | ORAL_CAPSULE | Freq: Four times a day (QID) | ORAL | 0 refills | Status: DC

## 2024-03-28 NOTE — Telephone Encounter (Signed)
 Pt called back wanting to speak with someone from office regarding triage this morning with possible allergic reaction. RN called CAL and transferred pt.

## 2024-03-28 NOTE — Telephone Encounter (Signed)
 Returned patients call - states there was no rash.  She only had two small spots on her leg that was red.  Does not think she got bitten by a bug.  She also had a red raised spot near her ear that itched and itching on her head.  States the spot near her ear is still there now but no itching. The spots showed up 3-4 hours after she took the first dose.  Patient states she took her second dose today at 1:30 and so far she is fine but she sounded nervous to take it.  Would like to know if she could be changed to keflex ?

## 2024-03-28 NOTE — Telephone Encounter (Signed)
 Pt was transferred from e2c2 pt reporting rash that started today unsure if it is related to Augmentin  that she recently started. Advised pt to hold off on 2nd dose until she hears back from us . Informed pt that message was sent to covering provider and that we are waiting on their response. Pt verbalized understanding

## 2024-03-28 NOTE — Telephone Encounter (Signed)
 I sent in keflex , as requested

## 2024-03-28 NOTE — Telephone Encounter (Signed)
 Pharmacy Patient Advocate Encounter  Received notification from [facility] that Prior Authorization for Methocarbamol  500MG  tablets  has been DENIED.  Full denial letter will be uploaded to the media tab. See denial reason below.   PA #/Case ID/Reference #: [id number]

## 2024-03-28 NOTE — Telephone Encounter (Signed)
  FYI Only or Action Required?: Action required by provider: clinical question for provider.  Patient was last seen in primary care on 03/21/2024 by Joesph Annabella HERO, FNP.  Called Nurse Triage reporting Allergic Reaction.  Symptoms began yesterday.  Interventions attempted: Nothing.  Symptoms are: unchanged.  Triage Disposition: See Physician Within 24 Hours  Patient/caregiver understands and will follow disposition?: No, wishes to speak with PCP                          Copied from CRM [id number]. Topic: Clinical - Red Word Triage >> Mar 28, 2024  9:38 AM Antwanette L wrote: Red Word that prompted transfer to Nurse Triage: patient thinks she maybe having an allergic reaction to amoxicillin  Reason for Disposition  [1] MODERATE-SEVERE widespread itching (i.e., interferes with sleep, normal activities or school) AND [2] not improved after 24 hours of itching Care Advice  Answer Assessment - Initial Assessment Questions Pt thinks she is having an allergic reaction to augmentin . Pt states she has a rash that is raised up on ear and two places on leg. Pt only took one pill yesterday at 3 PM. Pt wants to know if a different antibiotic can be called in or what she should do. Pt requests a call back.  SEVERITY: How bad is it?    - MILD: Doesn't interfere with normal activities.   - MODERATE-SEVERE: Interferes with work, school, sleep, or other activities.      Moderate SCRATCHING: Are there any scratch marks? Bleeding?     No ONSET: When did this begin?      Pt states she took augmentin  at 3 PM yesterday and then 4 hours later started itching CAUSE: What do you think is causing the itching? (ask about swimming pools, pollen, animals, soaps, etc.)     Antibiotic OTHER SYMPTOMS: Do you have any other symptoms?      No  Protocols used: Itching - [employer]

## 2024-03-29 ENCOUNTER — Telehealth: Payer: Self-pay

## 2024-03-29 NOTE — Telephone Encounter (Signed)
 Patient states she can't take that she will going to pay out of pocket fyi

## 2024-03-29 NOTE — Telephone Encounter (Signed)
 After further evaluation and speaking with Rosaline Bruns, FNP the keflex  is not first line treatment for this particular bacteria and the Augmentin  is preferred. Rosaline does not believe this is a reaction the the augmentin  and would like pt to complete the Augmentin  as prescribed and can take pepcid and Claritin daily with it and to monitor for any other symptoms. Pt aware of provider feedback and voiced understanding.

## 2024-03-29 NOTE — Telephone Encounter (Signed)
 Copied from CRM [id number]. Topic: Clinical - Medical Advice >> Mar 29, 2024  9:42 AM Donna BRAVO wrote: Reason for CRM: patient returning call from [facility], would speak with Arnulfo about Keflex  and if it will help with UTI   Call dropped, called patient back.  Called CAL

## 2024-03-29 NOTE — Telephone Encounter (Signed)
 Left message advising Keflex  sent in as requested and to call back with any further questions or concerns.

## 2024-03-29 NOTE — Telephone Encounter (Signed)
 Robaxin  denied from insurance. Has she took Flexeril in the past?

## 2024-04-09 ENCOUNTER — Telehealth (INDEPENDENT_AMBULATORY_CARE_PROVIDER_SITE_OTHER): Admitting: Family

## 2024-04-09 ENCOUNTER — Ambulatory Visit: Payer: Self-pay | Admitting: *Deleted

## 2024-04-09 ENCOUNTER — Encounter: Payer: Self-pay | Admitting: Family

## 2024-04-09 DIAGNOSIS — Z87440 Personal history of urinary (tract) infections: Secondary | ICD-10-CM | POA: Diagnosis not present

## 2024-04-09 DIAGNOSIS — R3915 Urgency of urination: Secondary | ICD-10-CM | POA: Diagnosis not present

## 2024-04-09 LAB — URINALYSIS, COMPLETE
Bilirubin, UA: NEGATIVE
Glucose, UA: NEGATIVE
Ketones, UA: NEGATIVE
Leukocytes,UA: NEGATIVE
Nitrite, UA: NEGATIVE
Protein,UA: NEGATIVE
Specific Gravity, UA: 1.015 (ref 1.005–1.030)
Urobilinogen, Ur: 0.2 mg/dL (ref 0.2–1.0)
pH, UA: 7 (ref 5.0–7.5)

## 2024-04-09 NOTE — Telephone Encounter (Signed)
 FYI Only or Action Required?: Action required by provider: clinical question for provider, update on patient condition, and requesting call back .  Patient was last seen in primary care on 03/21/2024 by Joesph Annabella HERO, FNP.  Called Nurse Triage reporting Urinary Frequency.  Symptoms began today.  Interventions attempted: Prescription medications: augmentin .  Symptoms are: unchanged.  Triage Disposition: See Physician Within 24 Hours  Patient/caregiver understands and will follow disposition?: No, wishes to speak with PCP              Copied from CRM [id number]. Topic: Clinical - Red Word Triage >> Apr 09, 2024 12:11 PM Elle L wrote: Red Word that prompted transfer to Nurse Triage: The patient states she has a UTI. She still feels like she has to use the restroom after urinating and she has been experiencing weakness. Reason for Disposition  Urinating more frequently than usual (i.e., frequency) OR new-onset of the feeling of an urgent need to urinate (i.e., urgency)  Answer Assessment - Initial Assessment Questions Patient requesting if she can submit another urine sample due to continued urinary frequency since completing augmentin . Unable to schedule tomorrow due to other appt. Please advise and call back if she can come in today or other recommendations.      1. SYMPTOM: What's the main symptom you're concerned about? (e.g., frequency, incontinence)     Frequency  2. ONSET: When did the  sx   start?     since 3. PAIN: Is there any pain? If Yes, ask: How bad is it? (Scale: 1-10; mild, moderate, severe)     na 4. CAUSE: What do you think is causing the symptoms?     Continued UTI  5. OTHER SYMPTOMS: Do you have any other symptoms? (e.g., blood in urine, fever, flank pain, pain with urination)     Frequency urination . Completed augmentin  course and still having frequency  6. PREGNANCY: Is there any chance you are pregnant? When was your last menstrual  period?     na  Protocols used: Urinary Symptoms-A-AH

## 2024-04-09 NOTE — Telephone Encounter (Signed)
 Per Bari ok to schedule video visit for today. Pt scheduled for video visit.

## 2024-04-09 NOTE — Progress Notes (Signed)
 Virtual Visit Consent   Ruth Hopkins, you are scheduled for a virtual visit with a [facility] provider today. Just as with appointments in the office, your consent must be obtained to participate. Your consent will be active for this visit and any virtual visit you may have with one of our providers in the next 365 days. If you have a MyChart account, a copy of this consent can be sent to you electronically.  As this is a virtual visit, video technology does not allow for your provider to perform a traditional examination. This may limit your provider's ability to fully assess your condition. If your provider identifies any concerns that need to be evaluated in person or the need to arrange testing (such as labs, EKG, etc.), we will make arrangements to do so. Although advances in technology are sophisticated, we cannot ensure that it will always work on either your end or our end. If the connection with a video visit is poor, the visit may have to be switched to a telephone visit. With either a video or telephone visit, we are not always able to ensure that we have a secure connection.  By engaging in this virtual visit, you consent to the provision of healthcare and authorize for your insurance to be billed (if applicable) for the services provided during this visit. Depending on your insurance coverage, you may receive a charge related to this service.  I need to obtain your verbal consent now. Are you willing to proceed with your visit today? Ruth Hopkins has provided verbal consent on 04/09/2024 for a virtual visit (video or telephone). Bari Learn, FNP  Date: 04/09/2024 3:58 PM   Virtual Visit via Video Note   I, Bari Learn, connected with  Ruth Hopkins  ([id number], 04-17-1966) on 04/09/24 at 10:55 AM EDT by a video-enabled telemedicine application and verified that I am speaking with the correct person using two identifiers.  Location: Patient: Virtual Visit Location Patient: Other:  car Provider: Virtual Visit Location Provider: Home Office   I discussed the limitations of evaluation and management by telemedicine and the availability of in person appointments. The patient expressed understanding and agreed to proceed.    History of Present Illness: Ruth Hopkins is a 58 y.o. who identifies as a female who was assigned female at birth, and is being seen today for Urinary urgency that started a couple weeks ago. Was seen on 03/21/24 and had a positive urine culture of Streptococcus mitis/oralis group. She completed her Augmentin .   Denies any dysuria, fever, or hematuria. Having mild urgency and feeling like she has not completely emptied her bladder.   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   Depression, recurrent (HCC) 05/26/2018   History of adenomatous polyp of colon 12/25/2017   Internal hemorrhoids 12/25/2017   Dense breast tissue on mammogram 07/18/2017   Acquired hypothyroidism 12/10/2016   At high risk for breast cancer 09/30/2016   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   Fatigue 01/10/2014   Irritable bowel syndrome with constipation 01/10/2014   Painful lumpy right breast 09/05/2013  Smoker 07/09/2013   Fibromyalgia 05/24/2013   Migraine with status migrainosus 05/24/2013   Morton's neuroma of right foot 05/24/2013   Pain in joint involving lower leg 05/24/2013   Lesion of plantar nerve 05/24/2013   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    Allergies:  Allergies  Allergen Reactions   Latex Itching and Rash   Codeine    Gabapentin       fuzzy   Sulfur Itching   Sulfa Antibiotics Hives, Itching and Rash   Medications:  Current Outpatient Medications:    acetaminophen  (TYLENOL ) 500 MG tablet, Take by mouth., Disp: , Rfl:    clonazePAM  (KLONOPIN ) 0.5 MG tablet, Take 1 tablet (0.5 mg total) by mouth 2 (two) times daily., Disp: 30 tablet, Rfl: 2   cycloSPORINE (RESTASIS) 0.05 % ophthalmic emulsion, Restasis 0.05 % eye drops in a dropperette  USE 1 DROP INTO BOTH EYES TWICE A DAY, Disp: , Rfl:    diclofenac  (VOLTAREN ) 75 MG EC tablet, Take 1 tablet (75 mg total) by mouth 2 (two) times daily. (Patient not taking: Reported on 03/21/2024), Disp: 30 tablet, Rfl: 2   eletriptan  (RELPAX ) 40 MG tablet, TAKE 1 TABLET BY MOUTH EVERY 2 HOURS AS NEEDED FOR MIGRAINE HEADACHE . DO NOT EXCEED 6 PER 24 HOURS, Disp: 12 tablet, Rfl: 2   estradiol  (VIVELLE -DOT) 0.075 MG/24HR, Place 1 patch onto the skin 2 (two) times a week., Disp: , Rfl:    finasteride (PROSCAR) 5 MG tablet, Take by mouth., Disp: , Rfl:    Fluocinolone  Acetonide (FLAC ) 0.01 % OIL, Place 1 drop in ear(s) daily. Place 1-2 drop in right ear, Disp: 20 mL, Rfl: 0   fluticasone  (FLONASE ) 50 MCG/ACT nasal spray, Place 2 sprays into both nostrils daily., Disp: 16 g, Rfl: 6   imipramine  (TOFRANIL ) 10 MG tablet, Take 3 tablets (30 mg total) by mouth at bedtime., Disp: 90 tablet, Rfl: 11   levothyroxine  (SYNTHROID ) 50 MCG tablet, TAKE 1 TABLET BY MOUTH ONCE DAILY BEFORE BREAKFAST, Disp: 90 tablet, Rfl: 3   methocarbamol  (ROBAXIN ) 500 MG tablet, Take 1 tablet (500 mg total) by mouth 4 (four) times daily., Disp: 20 tablet, Rfl: 0   mometasone  (NASONEX ) 50 MCG/ACT nasal spray, PLACE 2 SPRAYS INTO THE NOSE DAILY., Disp: 51 each, Rfl: 1   Multiple Vitamins-Minerals (MULTIVITAMIN PO), Take by mouth., Disp: , Rfl:    omeprazole  (PRILOSEC) 20 MG capsule, Take 1 capsule by mouth once daily, Disp: 90 capsule, Rfl: 0   predniSONE  (STERAPRED UNI-PAK 21 TAB) 10 MG (21) TBPK tablet, Use as directed on  back of pill pack, Disp: 21 tablet, Rfl: 0   tacrolimus  (PROTOPIC ) 0.1 % ointment, tacrolimus  0.1 % topical ointment  APPLY OINTMENT TOPICALLY TO AFFECTED AREA TWICE DAILY, Disp: , Rfl:    valACYclovir  (VALTREX ) 500 MG tablet, Take 500 mg by mouth daily as needed., Disp: , Rfl:   Observations/Objective: Patient is well-developed, well-nourished in no acute distress.  Resting comfortably at home.  Head is normocephalic, atraumatic.  No labored breathing. Speech is clear and coherent with logical content.  Patient is alert and oriented at baseline.    Assessment and Plan: 1. History of UTI - Urinalysis, Complete - Urine Culture  2. Urgency of urination (Primary) - Urinalysis, Complete - Urine Culture  Force fluids Pt will come and get urine tested Follow if symptoms worsen or do not improve   Follow Up Instructions: I discussed the assessment and treatment plan with the patient. The  patient was provided an opportunity to ask questions and all were answered. The patient agreed with the plan and demonstrated an understanding of the instructions.  A copy of instructions were sent to the patient via MyChart unless otherwise noted below.     The patient was advised to call back or seek an in-person evaluation if the symptoms worsen or if the condition fails to improve as anticipated.    Bari Learn, FNP

## 2024-04-10 ENCOUNTER — Ambulatory Visit: Payer: Self-pay | Admitting: Family

## 2024-04-12 LAB — URINE CULTURE

## 2024-04-13 ENCOUNTER — Other Ambulatory Visit: Payer: Self-pay | Admitting: *Deleted

## 2024-04-13 ENCOUNTER — Ambulatory Visit: Payer: Self-pay

## 2024-04-13 DIAGNOSIS — N3000 Acute cystitis without hematuria: Secondary | ICD-10-CM

## 2024-04-13 MED ORDER — AMOXICILLIN-POT CLAVULANATE 875-125 MG PO TABS: 1.0000 | ORAL_TABLET | Freq: Two times a day (BID) | ORAL | 0 refills | Status: DC

## 2024-04-13 MED ORDER — AMOXICILLIN-POT CLAVULANATE 875-125 MG PO TABS: 1.0000 | ORAL_TABLET | Freq: Two times a day (BID) | ORAL | 0 refills | Status: AC

## 2024-04-13 NOTE — Telephone Encounter (Signed)
 Called to speak with patient left message to return are call.

## 2024-04-13 NOTE — Telephone Encounter (Signed)
 FYI Only or Action Required?: Action required by provider: Patient is saying that her urine is positive and not negative.  Would like a call back toady.   Patient was last seen in primary care on 04/09/2024 by Lavell Bari LABOR, FNP.  Called Nurse Triage reporting Advice Only.  Symptoms began today.  Interventions attempted: Nothing.  Symptoms are: unchanged.  Triage Disposition: No disposition on file.  Patient/caregiver understands and will follow disposition?: No, wishes to speak with PCP        Copied from CRM #[id number]. Topic: Clinical - Lab/Test Results >> Apr 13, 2024  2:34 PM Sophia H wrote: Reason for CRM: Patient states had urine sample done for UTI and came back positive. States it has gotten much worse since Monday, wants to speak with nurse regarding medications she can take, etc. Reason for Disposition  General information question, no triage required and triager able to answer question  Answer Assessment - Initial Assessment Questions 1. REASON FOR CALL: What is the main reason for your call? or How can I best help you?     Patient states she looked at her urine culture and states it is positive; this RN read KYM Lavell FNP note to her that states urine culture is negative.  Patient very argumentative.  Instructed her to go to UC.  Would like call back from office RN/Nurse/FNP  Protocols used: Information Only Call - No Triage-A-AH

## 2024-04-13 NOTE — Telephone Encounter (Signed)
 Urine culture came back + yesterday hawks is on vacation please advise

## 2024-04-13 NOTE — Telephone Encounter (Signed)
 Patient aware and verbalized understanding. Patient walked in.

## 2024-04-13 NOTE — Addendum Note (Signed)
 Addended by: SEVERA ROCK HERO on: 04/13/2024 02:59 PM   Modules accepted: Orders

## 2024-04-13 NOTE — Progress Notes (Signed)
 Rx did not go electronically d/t the type of telephone encounter that it was under. Script resent.

## 2024-04-13 NOTE — Addendum Note (Signed)
 Addended by: SEVERA ROCK HERO on: 04/13/2024 03:01 PM   Modules accepted: Orders

## 2024-04-18 ENCOUNTER — Ambulatory Visit: Payer: Self-pay

## 2024-04-18 ENCOUNTER — Encounter: Payer: Self-pay | Admitting: Family Medicine

## 2024-04-18 ENCOUNTER — Telehealth: Admitting: Family Medicine

## 2024-04-18 DIAGNOSIS — N390 Urinary tract infection, site not specified: Secondary | ICD-10-CM

## 2024-04-18 DIAGNOSIS — R110 Nausea: Secondary | ICD-10-CM

## 2024-04-18 DIAGNOSIS — F339 Major depressive disorder, recurrent, unspecified: Secondary | ICD-10-CM | POA: Diagnosis not present

## 2024-04-18 DIAGNOSIS — R5383 Other fatigue: Secondary | ICD-10-CM

## 2024-04-18 NOTE — Telephone Encounter (Signed)
 Appt made for today

## 2024-04-18 NOTE — Telephone Encounter (Signed)
 Duplicate message. MyChart video visit scheduled for today.

## 2024-04-18 NOTE — Progress Notes (Signed)
 Virtual Visit via Video   I connected with patient on 04/18/24 at 1545 by a video enabled telemedicine application and verified that I am speaking with the correct person using two identifiers.  Location patient: Home Location provider: [facility] Persons participating in the virtual visit: Patient and Provider  I discussed the limitations of evaluation and management by telemedicine and the availability of in person appointments. The patient expressed understanding and agreed to proceed.  Subjective:   HPI:  Pt presents today for  Chief Complaint  Patient presents with   Urinary Tract Infection    Ruth Hopkins is a 58 year old female with recurrent urinary tract infections who presents with nausea and fatigue.  She was prescribed Augmentin  for a urinary tract infection on Friday. Since starting the medication, she has experienced significant nausea and fatigue. She has been taking the medication with food and has been on it since Friday, but she is unsure of the exact number of doses taken. Despite the treatment, she continues to experience frequent urination.  She mentions a previous UTI treated with Augmentin , after which a different organism was identified in a follow-up culture. The current urine culture shows susceptibility to penicillin. She is currently on Augmentin , a form of penicillin. No confusion or significant lower back pain is present. She is staying hydrated and eating well. She has a history of recurrent UTIs with different organisms identified in past cultures, including streptococcus and lactobacillus species.  She recently started taking Wellbutrin  last week and wonders if it could be contributing to her nausea. She takes Wellbutrin  in the morning and has noted that it might be affecting her sleep quality, contributing to her fatigue.  She mentions a possible diagnosis of Crohn's disease, which she believes may be contributing to her  symptoms.       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   Depression, recurrent (HCC) 05/26/2018   History of adenomatous polyp of colon 12/25/2017   Internal hemorrhoids 12/25/2017   Dense breast tissue on mammogram 07/18/2017   Acquired hypothyroidism 12/10/2016   At high risk for breast cancer 09/30/2016   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   Fatigue 01/10/2014   Irritable bowel syndrome with constipation 01/10/2014   Painful lumpy right breast 09/05/2013   Smoker 07/09/2013   Fibromyalgia 05/24/2013   Migraine with status migrainosus 05/24/2013   Morton's neuroma of right foot 05/24/2013   Pain in joint involving lower leg 05/24/2013   Lesion of plantar nerve 05/24/2013   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    Social History   Tobacco Use   Smoking status: Every Day    Current packs/day: 1.00    Average packs/day: 1 pack/day for 46.5 years (46.5 ttl pk-yrs)    Types: Cigarettes    Start date: 11/02/1977   Smokeless tobacco: Never   Tobacco comments:    Smoking since age 24 - 1ppd usually - has tried to quit several times - is afraid it will make her gain weight    Started smoking again a few weeks  ago, maybe 15 ciggs per day --12/16/2022  Substance Use Topics   Alcohol use: No    Current Outpatient Medications:    acetaminophen  (TYLENOL ) 500 MG tablet, Take by mouth., Disp: , Rfl:    amoxicillin -clavulanate (AUGMENTIN ) 875-125 MG tablet, Take 1 tablet by mouth 2 (two) times daily for 7 days., Disp: 14 tablet, Rfl: 0   clonazePAM  (KLONOPIN ) 0.5 MG tablet,  Take 1 tablet (0.5 mg total) by mouth 2 (two) times daily., Disp: 30 tablet, Rfl: 2   cycloSPORINE (RESTASIS) 0.05 % ophthalmic emulsion, Restasis 0.05 % eye drops in a dropperette  USE 1 DROP INTO BOTH EYES TWICE A DAY, Disp: , Rfl:    diclofenac  (VOLTAREN ) 75 MG EC tablet, Take 1 tablet (75 mg total) by mouth 2 (two) times daily. (Patient not taking: Reported on 03/21/2024), Disp: 30 tablet, Rfl: 2   eletriptan  (RELPAX ) 40 MG tablet, TAKE 1 TABLET BY MOUTH EVERY 2 HOURS AS NEEDED FOR MIGRAINE HEADACHE . DO NOT EXCEED 6 PER 24 HOURS, Disp: 12 tablet, Rfl: 2   estradiol  (VIVELLE -DOT) 0.075 MG/24HR, Place 1 patch onto the skin 2 (two) times a week., Disp: , Rfl:    finasteride (PROSCAR) 5 MG tablet, Take by mouth., Disp: , Rfl:    Fluocinolone  Acetonide (FLAC ) 0.01 % OIL, Place 1 drop in ear(s) daily. Place 1-2 drop in right ear, Disp: 20 mL, Rfl: 0   fluticasone  (FLONASE ) 50 MCG/ACT nasal spray, Place 2 sprays into both nostrils daily., Disp: 16 g, Rfl: 6   imipramine  (TOFRANIL ) 10 MG tablet, Take 3 tablets (30 mg total) by mouth at bedtime., Disp: 90 tablet, Rfl: 11   levothyroxine  (SYNTHROID ) 50 MCG tablet, TAKE 1 TABLET BY MOUTH ONCE DAILY BEFORE BREAKFAST, Disp: 90 tablet, Rfl: 3   methocarbamol  (ROBAXIN ) 500 MG tablet, Take 1 tablet (500 mg total) by mouth 4 (four) times daily., Disp: 20 tablet, Rfl: 0   mometasone  (NASONEX ) 50 MCG/ACT nasal spray, PLACE 2 SPRAYS INTO THE NOSE DAILY., Disp: 51 each, Rfl: 1   Multiple Vitamins-Minerals (MULTIVITAMIN PO), Take by mouth., Disp: , Rfl:    omeprazole  (PRILOSEC) 20 MG capsule, Take 1 capsule by mouth once daily, Disp: 90 capsule, Rfl: 0   predniSONE  (STERAPRED UNI-PAK 21 TAB) 10 MG (21) TBPK tablet, Use as directed on back of pill pack, Disp: 21 tablet, Rfl: 0   tacrolimus  (PROTOPIC ) 0.1 % ointment, tacrolimus  0.1 % topical ointment  APPLY OINTMENT TOPICALLY TO AFFECTED AREA TWICE DAILY, Disp: , Rfl:    valACYclovir  (VALTREX ) 500 MG tablet, Take 500 mg  by mouth daily as needed., Disp: , Rfl:   Allergies  Allergen Reactions   Latex Itching and Rash   Codeine    Gabapentin      fuzzy   Sulfur Itching   Sulfa Antibiotics Hives, Itching and Rash    Objective:   There were no vitals taken for this visit.  Patient is well-developed, well-nourished in no acute distress.  Resting comfortably at home.  Head is normocephalic, atraumatic.  No labored breathing.  Speech is clear and coherent with logical content.  Patient is alert and oriented at baseline.    Assessment and Plan:   [place] was seen today for urinary tract infection.  Diagnoses and all orders for this visit:  Frequent UTI -     Urine Culture; Future  Nausea -     Urine Culture; Future  Other fatigue -     Urine Culture; Future     Urinary tract  infection  Recurrent UTI. Current treatment with Augmentin , to which the organism is susceptible. No signs of pyelonephritis or sepsis, such as high fevers, significant lower back pain, or confusion. Previous UTI treated with Augmentin  showed different organisms, including Streptococcus oralis and Lactobacillus species. - Continue Augmentin  as prescribed. - Order repeat urine culture 4-5 days after completing Augmentin .  Nausea likely secondary to medication Nausea possibly related to recent initiation of Wellbutrin , known to cause gastrointestinal upset. Reports nausea prior to starting Augmentin , suggesting it may not be the cause. - Take Wellbutrin  with food to minimize gastrointestinal upset.  Major depressive disorder Recently started on Wellbutrin  for major depressive disorder. Reports potential side effects, including nausea and sleep disturbances. - Continue Wellbutrin  in the morning.  Fatigue Fatigue may be multifactorial, potentially related to inadequate sleep, medication side effects, or underlying conditions such as suspected Crohn's disease.        Return if symptoms worsen or fail to  improve.  Rosaline Bruns, FNP-C [facility] [address] [place], [place] [address] [phone]  04/18/2024  Time spent with the patient: 15 minutes, of which >50% was spent in obtaining information about symptoms, reviewing previous labs, evaluations, and treatments, counseling about condition (please see the discussed topics above), and developing a plan to further investigate it; had a number of questions which I addressed.

## 2024-04-18 NOTE — Telephone Encounter (Signed)
  FYI Only or Action Required?: Action required by provider: update on patient condition.  Patient was last seen in primary care on 04/09/2024 by Lavell Bari LABOR, FNP.  Called Nurse Triage reporting Urinary Tract Infection.  Symptoms began about a month ago.  Interventions attempted: Prescription medications: amoxicillin .  Symptoms are: unchanged.  Triage Disposition: Call PCP Within 24 Hours  Patient/caregiver understands and will follow disposition?: Yes Message from [facility] sent at 04/18/2024 12:09 PM EDT  Summary: rx concern / congestion   The patient shares that they have recently been prescribed amoxicillin -clavulanate (AUGMENTIN ) 875-125 MG tablet [[id number]] but are continuing to experience respiratory discomfort and congestion. The patient would like to discuss the effectiveness of the medication with a member of clinical staff. Please contact when possible         Reason for Disposition  [1] POSITIVE urine test AND [2] antibiotic treatment in past month for urine infection  Answer Assessment - Initial Assessment Questions Patient is being treated for UTI, but continues to have symptoms.  Multiple phone calls to reach office.  This RN spoke to [place] at [facility] and she states that she will send message to clinic RN for follow up.   Phone call to patient dropped.  Attempted to reach patient again, LVM with details and phone number for follow up, [phone]  Protocols used: Urinalysis Results Follow-up Call-A-AH FYI Only or Action Required?: Action required by provider: clinical question for provider and update on patient condition.  Patient was last seen in primary care on 04/09/2024 by Lavell Bari LABOR, FNP.  Called Nurse Triage reporting Urinary Tract Infection.  Symptoms began about a month ago.  Interventions attempted: Prescription medications: amoxicillin .  Symptoms are: unchanged.  Triage Disposition: Call PCP Within 24 Hours  Patient/caregiver  understands and will follow disposition?: Yes

## 2024-04-18 NOTE — Telephone Encounter (Addendum)
 Spoke with patient, MyChart video visit scheduled for this afternoon.

## 2024-04-18 NOTE — Telephone Encounter (Signed)
 FYI Only or Action Required?: Action required by provider: antibiotic is causing nausea; would like to know if a different antibiotic would be better due to still having pain when urinating.  Patient was last seen in primary care on 04/09/2024 by Lavell Bari LABOR, FNP.  Called Nurse Triage reporting Recurrent UTI.  Symptoms began several days ago.  Interventions attempted: Nothing.  Symptoms are: unchanged.  Triage Disposition: See Physician Within 24 Hours  Patient/caregiver understands and will follow disposition?: No, wishes to speak with PCP   Copied from CRM #[id number]. Topic: Clinical - Red Word Triage >> Apr 18, 2024 12:15 PM Jasmin G wrote: [facility] that prompted transfer to Nurse Triage: Pt ix experiencing a painful UTI, she had been taking an antibiotic to treat but is nor really working and is also experiencing nausea as a side effect from med Reason for Disposition  All other patients with painful urination  (Exception: [1] EITHER frequency or urgency AND [2] has on-call doctor.)  Answer Assessment - Initial Assessment Questions 1. SEVERITY: How bad is the pain?  (e.g., Scale 1-10; mild, moderate, or severe)     moderate 2. FREQUENCY: How many times have you had painful urination today?      yes 3. PATTERN: Is pain present every time you urinate or just sometimes?      no 4. ONSET: When did the painful urination start?      On 7/24 5. FEVER: Do you have a fever? If Yes, ask: What is your temperature, how was it measured, and when did it start?     Yes, doesn't know for sure 6. PAST UTI: Have you had a urine infection before? If Yes, ask: When was the last time? and What happened that time?      Yes, on antibiotics 7. CAUSE: What do you think is causing the painful urination?  (e.g., UTI, scratch, Herpes sore)     UTI 8. OTHER SYMPTOMS: Do you have any other symptoms? (e.g., blood in urine, flank pain, genital sores, urgency, vaginal discharge)      Nausea,  9. PREGNANCY: Is there any chance you are pregnant? When was your last menstrual period?     na  Protocols used: Urination Pain - Female-A-AH

## 2024-04-19 MED ORDER — CIPROFLOXACIN HCL 500 MG PO TABS: 500.0000 mg | ORAL_TABLET | Freq: Two times a day (BID) | ORAL | 0 refills | Status: DC

## 2024-04-20 ENCOUNTER — Telehealth: Payer: Self-pay

## 2024-04-20 MED ORDER — FLUCONAZOLE 150 MG PO TABS: ORAL_TABLET | ORAL | 0 refills | Status: DC

## 2024-04-20 NOTE — Addendum Note (Signed)
 Addended by: Najmo Pardue, MARY-MARGARET on: 04/20/2024 11:59 AM   Modules accepted: Orders

## 2024-04-20 NOTE — Telephone Encounter (Signed)
 Copied from CRM [id number]. Topic: Clinical - Lab/Test Results >> Apr 19, 2024  5:04 PM Ruth Hopkins wrote: Reason for CRM: advised pt of lab results she wanted to speak with nurse regarding the change of antibiotics. Could you please call her around noon to discuss.    ----------------------------------------------------------------------- From previous Reason for Contact - Medication Question: Reason for CRM:

## 2024-04-24 ENCOUNTER — Ambulatory Visit: Admitting: Family

## 2024-04-24 DIAGNOSIS — Z86000 Personal history of in-situ neoplasm of breast: Secondary | ICD-10-CM | POA: Diagnosis not present

## 2024-04-24 DIAGNOSIS — Z9189 Other specified personal risk factors, not elsewhere classified: Secondary | ICD-10-CM | POA: Diagnosis not present

## 2024-04-24 DIAGNOSIS — R92333 Mammographic heterogeneous density, bilateral breasts: Secondary | ICD-10-CM | POA: Diagnosis not present

## 2024-04-24 DIAGNOSIS — N6001 Solitary cyst of right breast: Secondary | ICD-10-CM | POA: Diagnosis not present

## 2024-04-26 ENCOUNTER — Encounter: Payer: Self-pay | Admitting: Family

## 2024-04-26 ENCOUNTER — Ambulatory Visit: Admitting: Family

## 2024-04-26 VITALS — BP 94/63 | HR 71 | Temp 98.7°F | Ht 64.0 in | Wt 129.0 lb

## 2024-04-26 DIAGNOSIS — J439 Emphysema, unspecified: Secondary | ICD-10-CM | POA: Diagnosis not present

## 2024-04-26 DIAGNOSIS — F419 Anxiety disorder, unspecified: Secondary | ICD-10-CM

## 2024-04-26 DIAGNOSIS — F17200 Nicotine dependence, unspecified, uncomplicated: Secondary | ICD-10-CM

## 2024-04-26 DIAGNOSIS — F339 Major depressive disorder, recurrent, unspecified: Secondary | ICD-10-CM

## 2024-04-26 DIAGNOSIS — N390 Urinary tract infection, site not specified: Secondary | ICD-10-CM | POA: Diagnosis not present

## 2024-04-26 DIAGNOSIS — G43901 Migraine, unspecified, not intractable, with status migrainosus: Secondary | ICD-10-CM

## 2024-04-26 DIAGNOSIS — E785 Hyperlipidemia, unspecified: Secondary | ICD-10-CM | POA: Diagnosis not present

## 2024-04-26 DIAGNOSIS — E039 Hypothyroidism, unspecified: Secondary | ICD-10-CM | POA: Diagnosis not present

## 2024-04-26 DIAGNOSIS — S1980XS Other specified injuries of unspecified part of neck, sequela: Secondary | ICD-10-CM

## 2024-04-26 DIAGNOSIS — M797 Fibromyalgia: Secondary | ICD-10-CM | POA: Diagnosis not present

## 2024-04-26 DIAGNOSIS — G8921 Chronic pain due to trauma: Secondary | ICD-10-CM | POA: Diagnosis not present

## 2024-04-26 DIAGNOSIS — K219 Gastro-esophageal reflux disease without esophagitis: Secondary | ICD-10-CM

## 2024-04-26 LAB — MICROSCOPIC EXAMINATION
Mucus, UA: NONE SEEN
Renal Epithel, UA: NONE SEEN /HPF
Yeast, UA: NONE SEEN

## 2024-04-26 LAB — URINALYSIS, ROUTINE W REFLEX MICROSCOPIC
Bilirubin, UA: NEGATIVE
Glucose, UA: NEGATIVE
Ketones, UA: NEGATIVE
Leukocytes,UA: NEGATIVE
Nitrite, UA: NEGATIVE
Protein,UA: NEGATIVE
Specific Gravity, UA: 1.015 (ref 1.005–1.030)
Urobilinogen, Ur: 0.2 mg/dL (ref 0.2–1.0)
pH, UA: 8 — ABNORMAL HIGH (ref 5.0–7.5)

## 2024-04-26 MED ORDER — OMEPRAZOLE 40 MG PO CPDR: 40.0000 mg | DELAYED_RELEASE_CAPSULE | Freq: Every day | ORAL | 3 refills | Status: AC

## 2024-04-26 MED ORDER — METHOCARBAMOL 500 MG PO TABS: 500.0000 mg | ORAL_TABLET | Freq: Four times a day (QID) | ORAL | 0 refills | Status: DC

## 2024-04-26 NOTE — Progress Notes (Addendum)
 Subjective:    Patient ID: Ruth Hopkins, female    DOB: 1966/05/11, 58 y.o.   MRN: [id number]  Chief Complaint  Patient presents with   Medical Management of Chronic Issues    2 month follow up   PT presents to the office today for  chronic follow up.    She is followed by psychiatrist 3 months for hot flashes and GAD.She is currently taking estradiol  patch and gradually decreasing and trying to add other medications. She has tried gabapentin , Lexapro , and Effexor, and wellbutrin  without success.   She is complaining of hair loss.    She has fibromyalgia and states she gets neck pain that causes her migraines.   She is followed by GYN annually   She is followed by GI she had repeat colonoscopy 07/15/23 with biopsy.   Has emphysema and continues to smoke 3/4 pack. Denies any SOB.   Had a UTI last month and wants urine tested today to recheck.  Migraine  This is a chronic problem. The current episode started more than 1 year ago. Episode frequency: 12 times a month with her neck pain. The problem has been unchanged. The pain is located in the Occipital and frontal region. The pain quality is similar to prior headaches. Associated symptoms include phonophobia (some times) and photophobia (some times). The symptoms are aggravated by emotional stress. She has tried triptans for the symptoms. The treatment provided mild relief.  Gastroesophageal Reflux She complains of belching and heartburn. This is a chronic problem. The current episode started more than 1 year ago. The problem occurs frequently. The symptoms are aggravated by smoking and certain foods. Associated symptoms include fatigue. She has tried a PPI and an antacid for the symptoms. The treatment provided moderate relief.  Constipation This is a chronic problem. The current episode started more than 1 year ago. The problem has been resolved since onset. Her stool frequency is 2 to 3 times per week. She has tried diet changes and  laxatives for the symptoms. The treatment provided moderate relief.  Thyroid  Problem Presents for follow-up visit. Symptoms include anxiety, constipation, fatigue and hair loss. The symptoms have been stable.  Hyperlipidemia This is a chronic problem. The current episode started more than 1 year ago. The problem is uncontrolled. Recent lipid tests were reviewed and are high. Current antihyperlipidemic treatment includes diet change. The current treatment provides mild improvement of lipids. Risk factors for coronary artery disease include dyslipidemia, hypertension and a sedentary lifestyle.  Back Pain This is a chronic problem. The current episode started more than 1 year ago. The problem occurs intermittently. The problem has been waxing and waning since onset. The pain is present in the thoracic spine and lumbar spine. The quality of the pain is described as aching. The pain is at a severity of 8/10 (8 when she stands, then resolves). The pain is moderate. The symptoms are aggravated by standing. She has tried muscle relaxant for the symptoms. The treatment provided moderate relief.  Depression        This is a chronic problem.  The current episode started more than 1 year ago.   The problem occurs intermittently.  Associated symptoms include fatigue, helplessness, hopelessness, restlessness and sad.  Past treatments include nothing.  Past medical history includes thyroid  problem and anxiety.   Anxiety Presents for follow-up visit. Symptoms include excessive worry, nervous/anxious behavior and restlessness. Symptoms occur occasionally. The severity of symptoms is mild.  Review of Systems  Constitutional:  Positive for fatigue.  Eyes:  Positive for photophobia (some times).  Gastrointestinal:  Positive for constipation and heartburn.  Psychiatric/Behavioral:  The patient is nervous/anxious.   All other systems reviewed and are negative.  Family History  Problem Relation Age of Onset    Asthma Mother    Hypothyroidism Mother    Hypertension Mother    Raynaud syndrome Mother    Hypertension Father    Social History   Socioeconomic History   Marital status: Married    Spouse name: Not on file   Number of children: 0   Years of education: Not on file   Highest education level: Associate degree: occupational, Scientist, product/process development, or vocational program  Occupational History   Occupation: disability  Tobacco Use   Smoking status: Every Day    Current packs/day: 1.00    Average packs/day: 1 pack/day for 46.5 years (46.5 ttl pk-yrs)    Types: Cigarettes    Start date: 11/02/1977   Smokeless tobacco: Never   Tobacco comments:    Smoking since age 9 - 1ppd usually - has tried to quit several times - is afraid it will make her gain weight    Started smoking again a few weeks ago, maybe 15 ciggs per day --12/16/2022  Vaping Use   Vaping status: Never Used  Substance and Sexual Activity   Alcohol use: No   Drug use: No   Sexual activity: Not on file  Other Topics Concern   Not on file  Social History Narrative   Lives home with husband in one level home   Her mother and sister live nearby   [place] faith   Social Drivers of Health   Financial Resource Strain: Low Risk  (03/20/2024)   Overall Financial Resource Strain (CARDIA)    Difficulty of Paying Living Expenses: Not hard at all  Food Insecurity: No Food Insecurity (03/20/2024)   Hunger Vital Sign    Worried About Running Out of Food in the Last Year: Never true    Ran Out of Food in the Last Year: Never true  Transportation Needs: No Transportation Needs (03/20/2024)   PRAPARE - Administrator, Civil Service (Medical): No    Lack of Transportation (Non-Medical): No  Physical Activity: Insufficiently Active (03/20/2024)   Exercise Vital Sign    Days of Exercise per Week: 2 days    Minutes of Exercise per Session: 30 min  Stress: No Stress Concern Present (03/20/2024)   [employer] of  Occupational Health - Occupational Stress Questionnaire    Feeling of Stress: Only a little  Recent Concern: Stress - Stress Concern Present (01/16/2024)   [employer] of Occupational Health - Occupational Stress Questionnaire    Feeling of Stress : To some extent  Social Connections: Moderately Integrated (03/20/2024)   Social Connection and Isolation Panel    Frequency of Communication with Friends and Family: More than three times a week    Frequency of Social Gatherings with Friends and Family: Twice a week    Attends Religious Services: More than 4 times per year    Active Member of [employer] or Organizations: No    Attends Engineer, structural: Not on file    Marital Status: Married       Objective:   Physical Exam Vitals reviewed.  Constitutional:      General: She is not in acute distress.    Appearance: She is well-developed.  HENT:  Head: Normocephalic and atraumatic.     Right Ear: Tympanic membrane normal.     Left Ear: Tympanic membrane normal.  Eyes:     Pupils: Pupils are equal, round, and reactive to light.  Neck:     Thyroid : No thyromegaly.  Cardiovascular:     Rate and Rhythm: Normal rate and regular rhythm.     Heart sounds: Normal heart sounds. No murmur heard. Pulmonary:     Effort: Pulmonary effort is normal. No respiratory distress.     Breath sounds: Normal breath sounds. No wheezing.  Abdominal:     General: Bowel sounds are normal. There is no distension.     Palpations: Abdomen is soft.     Tenderness: There is no abdominal tenderness.  Musculoskeletal:        General: No tenderness. Normal range of motion.     Cervical back: Normal range of motion and neck supple.  Skin:    General: Skin is warm and dry.  Neurological:     Mental Status: She is alert and oriented to person, place, and time.     Cranial Nerves: No cranial nerve deficit.     Deep Tendon Reflexes: Reflexes are normal and symmetric.  Psychiatric:        Behavior:  Behavior normal.        Thought Content: Thought content normal.        Judgment: Judgment normal.       BP 95/61   Pulse 71   Temp 98.7 F (37.1 C)   Ht 5' 4 (1.626 m)   Wt 129 lb (58.5 kg)   SpO2 98%   BMI 22.14 kg/m      Assessment & Plan:   ZACARIA POUSSON comes in today with chief complaint of Medical Management of Chronic Issues (2 month follow up)   Diagnosis and orders addressed:  1. Frequent UTI (Primary) - Urinalysis, Routine w reflex microscopic - CMP14+EGFR - Urine Culture  2. Anxiety - CMP14+EGFR  3. Fibromyalgia - CMP14+EGFR  4. Migraine with status migrainosus, not intractable, unspecified migraine type - CMP14+EGFR  5. Acquired hypothyroidism - CMP14+EGFR  6. Smoker - CMP14+EGFR  7. Depression, recurrent (HCC) - CMP14+EGFR  8. Gastroesophageal reflux disease, unspecified whether esophagitis present Prilosec increased to 40 mg from 20 mg -Diet discussed- Avoid fried, spicy, citrus foods, caffeine and alcohol -Do not eat 2-3 hours before bedtime -Encouraged small frequent meals -Avoid NSAID's - CMP14+EGFR - omeprazole  (PRILOSEC) 40 MG capsule; Take 1 capsule (40 mg total) by mouth daily.  Dispense: 90 capsule; Refill: 3  9. Hyperlipidemia, unspecified hyperlipidemia type - CMP14+EGFR  10. 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  Dispense: 20 tablet; Refill: 0   Labs pending Will increase omeprazole  to 40 mg from 20 mg  -Diet discussed- Avoid fried, spicy, citrus foods, caffeine and alcohol -Do not eat 2-3 hours before bedtime -Encouraged small frequent meals Continue current medications  Keep follow up with specialist  Health Maintenance reviewed Diet and exercise encouraged  Follow up plan: 6 months   Bari Learn, FNP

## 2024-04-26 NOTE — Patient Instructions (Signed)

## 2024-04-27 ENCOUNTER — Ambulatory Visit: Payer: Self-pay | Admitting: Family

## 2024-04-27 LAB — CMP14+EGFR
ALT: 17 IU/L (ref 0–32)
AST: 22 IU/L (ref 0–40)
Albumin: 4.1 g/dL (ref 3.8–4.9)
Alkaline Phosphatase: 52 IU/L (ref 44–121)
BUN/Creatinine Ratio: 34 — ABNORMAL HIGH (ref 9–23)
BUN: 25 mg/dL — ABNORMAL HIGH (ref 6–24)
Bilirubin Total: 0.4 mg/dL (ref 0.0–1.2)
CO2: 23 mmol/L (ref 20–29)
Calcium: 8.9 mg/dL (ref 8.7–10.2)
Chloride: 100 mmol/L (ref 96–106)
Creatinine, Ser: 0.74 mg/dL (ref 0.57–1.00)
Globulin, Total: 2.4 g/dL (ref 1.5–4.5)
Glucose: 79 mg/dL (ref 70–99)
Potassium: 4.1 mmol/L (ref 3.5–5.2)
Sodium: 138 mmol/L (ref 134–144)
Total Protein: 6.5 g/dL (ref 6.0–8.5)
eGFR: 94 mL/min/1.73 (ref >59)

## 2024-04-28 LAB — URINE CULTURE

## 2024-05-04 ENCOUNTER — Encounter: Payer: Self-pay | Admitting: Family Medicine

## 2024-05-04 ENCOUNTER — Telehealth (INDEPENDENT_AMBULATORY_CARE_PROVIDER_SITE_OTHER): Admitting: Family Medicine

## 2024-05-04 ENCOUNTER — Ambulatory Visit: Payer: Self-pay

## 2024-05-04 DIAGNOSIS — U071 COVID-19: Secondary | ICD-10-CM | POA: Diagnosis not present

## 2024-05-04 MED ORDER — ONDANSETRON 4 MG PO TBDP: 4.0000 mg | ORAL_TABLET | Freq: Three times a day (TID) | ORAL | 0 refills | Status: AC | PRN

## 2024-05-04 MED ORDER — NIRMATRELVIR/RITONAVIR (PAXLOVID)TABLET: 3.0000 | ORAL_TABLET | Freq: Two times a day (BID) | ORAL | 0 refills | Status: DC

## 2024-05-04 NOTE — Telephone Encounter (Signed)
 Pt. Called back and COVID test is positive. Day 2 of symptoms. States her PCP would call Paxlovid  in for her. Please advise pt.

## 2024-05-04 NOTE — Telephone Encounter (Signed)
 FYI Only or Action Required?: Action required by provider: clinical question for provider.  Patient was last seen in primary care on 04/26/2024 by Lavell Bari LABOR, FNP.  Called Nurse Triage reporting Fatigue.  Symptoms began yesterday.  Interventions attempted: Rest, hydration, or home remedies.  Symptoms are: unchanged.  Triage Disposition: See PCP When Office is Open (Within 3 Days)  Patient/caregiver understands and will follow disposition?: UnsureCopied from CRM #[id number]. Topic: Clinical - Red Word Triage >> May 04, 2024 12:21 PM Turkey B wrote: [facility] that prompted transfer to Nurse Triage: Patient had severe pain in stomach last night, now is very fatigued, feels weak Reason for Disposition  [1] MILD weakness (e.g., does not interfere with ability to work, go to school, normal activities) AND [2] persists > 1 week  Answer Assessment - Initial Assessment Questions Pt is stating PCP told her if she is covid positive she will prescribe me medication. Pt feels the same as she did last 2 times she had covid. Pt is going to get test and will call back with results.         1. DESCRIPTION: Describe how you are feeling.     Overall weak like when I had covid 2. SEVERITY: How bad is it?  Can you stand and walk?     denies 3. ONSET: When did these symptoms begin? (e.g., hours, days, weeks, months)     Yesterday morning  4. CAUSE: What do you think is causing the weakness or fatigue? (e.g., not drinking enough fluids, medical problem, trouble sleeping)     Pt thinks this is covid  5. NEW MEDICINES:  Have you started on any new medicines recently? (e.g., opioid pain medicines, benzodiazepines, muscle relaxants, antidepressants, antihistamines, neuroleptics, beta blockers)     Na  6. OTHER SYMPTOMS: Do you have any other symptoms? (e.g., chest pain, fever, cough, SOB, vomiting, diarrhea, bleeding, other areas of pain)     Bloating, stomach making  sounds  Protocols used: Weakness (Generalized) and Fatigue-A-AH

## 2024-05-04 NOTE — Progress Notes (Signed)
 Virtual Visit via MyChart video note  I connected with Ruth Hopkins on 05/04/24 at 1645 by video and verified that I am speaking with the correct person using two identifiers. Ruth Hopkins is currently located at home and patient are currently with her during visit. The provider, Fonda LABOR Thornton Dohrmann, MD is located in their office at time of visit.  Call ended at 1652  I discussed the limitations, risks, security and privacy concerns of performing an evaluation and management service by video and the availability of in person appointments. I also discussed with the patient that there may be a patient responsible charge related to this service. The patient expressed understanding and agreed to proceed.   History and Present Illness: Discussed the use of AI scribe software for clinical note transcription with the patient, who gave verbal consent to proceed.  History of Present Illness   Ruth Hopkins is a 58 year old female who presents with symptoms suggestive of COVID-19.  She woke up yesterday with symptoms similar to her previous COVID-19 infections, which she has had twice before. Her symptoms include weakness, nausea, fatigue, abdominal distension, and unusual abdominal sounds.  She performed a COVID-19 test using an expired kit, which showed a faintly positive result. She has since purchased two new COVID-19 tests but has not yet used them.  She experiences chills. She has felt unwell for two consecutive days, with body aches and a general lack of energy.  In the past, she was treated with Paxlovid  for COVID-19, which she found effective.  She is currently taking eletriptan  as needed for migraines and took one yesterday.       1. COVID-19 virus infection     Outpatient Encounter Medications as of 05/04/2024  Medication Sig   nirmatrelvir /ritonavir  (PAXLOVID ) 20 x 150 MG & 10 x 100MG  TABS Take 3 tablets by mouth 2 (two) times daily for 5 days. (Take nirmatrelvir  150 mg two tablets  twice daily for 5 days and ritonavir  100 mg one tablet twice daily for 5 days) Patient GFR is 94   ondansetron  (ZOFRAN -ODT) 4 MG disintegrating tablet Take 1 tablet (4 mg total) by mouth every 8 (eight) hours as needed for nausea or vomiting.   acetaminophen  (TYLENOL ) 500 MG tablet Take by mouth.   buPROPion  (WELLBUTRIN  XL) 150 MG 24 hr tablet Take 150 mg by mouth.   clonazePAM  (KLONOPIN ) 0.5 MG tablet Take 1 tablet (0.5 mg total) by mouth 2 (two) times daily.   cycloSPORINE (RESTASIS) 0.05 % ophthalmic emulsion Restasis 0.05 % eye drops in a dropperette  USE 1 DROP INTO BOTH EYES TWICE A DAY   diclofenac  (VOLTAREN ) 75 MG EC tablet Take 1 tablet (75 mg total) by mouth 2 (two) times daily.   eletriptan  (RELPAX ) 40 MG tablet TAKE 1 TABLET BY MOUTH EVERY 2 HOURS AS NEEDED FOR MIGRAINE HEADACHE . DO NOT EXCEED 6 PER 24 HOURS   estradiol  (VIVELLE -DOT) 0.075 MG/24HR Place 1 patch onto the skin 2 (two) times a week.   finasteride (PROSCAR) 5 MG tablet Take by mouth.   Fluocinolone  Acetonide (FLAC ) 0.01 % OIL Place 1 drop in ear(s) daily. Place 1-2 drop in right ear   fluticasone  (FLONASE ) 50 MCG/ACT nasal spray Place 2 sprays into both nostrils daily.   imipramine  (TOFRANIL ) 10 MG tablet Take 3 tablets (30 mg total) by mouth at bedtime.   levothyroxine  (SYNTHROID ) 50 MCG tablet TAKE 1 TABLET BY MOUTH ONCE DAILY BEFORE BREAKFAST   methocarbamol  (ROBAXIN ) 500  MG tablet Take 1 tablet (500 mg total) by mouth 4 (four) times daily.   mometasone  (NASONEX ) 50 MCG/ACT nasal spray PLACE 2 SPRAYS INTO THE NOSE DAILY.   Multiple Vitamins-Minerals (MULTIVITAMIN PO) Take by mouth.   omeprazole  (PRILOSEC) 40 MG capsule Take 1 capsule (40 mg total) by mouth daily.   tacrolimus  (PROTOPIC ) 0.1 % ointment tacrolimus  0.1 % topical ointment  APPLY OINTMENT TOPICALLY TO AFFECTED AREA TWICE DAILY   valACYclovir  (VALTREX ) 500 MG tablet Take 500 mg by mouth daily as needed.   No facility-administered encounter medications on  file as of 05/04/2024.    Review of Systems  Constitutional:  Positive for chills and fatigue. Negative for fever.  HENT:  Negative for congestion, ear discharge and ear pain.   Eyes:  Negative for redness and visual disturbance.  Respiratory:  Negative for chest tightness and shortness of breath.   Cardiovascular:  Negative for chest pain and leg swelling.  Gastrointestinal:  Positive for abdominal distention and nausea. Negative for diarrhea and vomiting.  Genitourinary:  Negative for difficulty urinating and dysuria.  Musculoskeletal:  Positive for myalgias. Negative for back pain and gait problem.  Skin:  Negative for rash.  Neurological:  Positive for weakness. Negative for dizziness, light-headedness and headaches.  Psychiatric/Behavioral:  Negative for agitation and behavioral problems.   All other systems reviewed and are negative.   Observations/Objective: Patient sounds comfortable and in no acute distress  Assessment and Plan: Problem List Items Addressed This Visit   None Visit Diagnoses       COVID-19 virus infection    -  Primary   Relevant Medications   nirmatrelvir /ritonavir  (PAXLOVID ) 20 x 150 MG & 10 x 100MG  TABS   ondansetron  (ZOFRAN -ODT) 4 MG disintegrating tablet         Suspected COVID-19 infection Symptoms consistent with COVID-19. Faintly positive expired test. Previous positive response to Paxlovid . - Prescribe Paxlovid  to be picked up at [facility] in [place]. - Advise hydration with fluids, soups, and Gatorade. - Instruct to avoid taking eletriptan  while on Paxlovid . - Advise to seek medical attention if symptoms worsen or do not improve.  Nausea Nausea likely related to suspected COVID-19 infection. - Prescribe Zofran  for nausea relief.        Follow up plan: Return if symptoms worsen or fail to improve.     I discussed the assessment and treatment plan with the patient. The patient was provided an opportunity to ask questions and all were  answered. The patient agreed with the plan and demonstrated an understanding of the instructions.   The patient was advised to call back or seek an in-person evaluation if the symptoms worsen or if the condition fails to improve as anticipated.  The above assessment and management plan was discussed with the patient. The patient verbalized understanding of and has agreed to the management plan. Patient is aware to call the clinic if symptoms persist or worsen. Patient is aware when to return to the clinic for a follow-up visit. Patient educated on when it is appropriate to go to the emergency department.    I provided 7 minutes of non-face-to-face time during this encounter.    Fonda DELENA Levins, MD

## 2024-05-04 NOTE — Telephone Encounter (Signed)
 Called patient to make after hour appt and had to leave message on voicemail to call back.

## 2024-05-08 ENCOUNTER — Ambulatory Visit: Payer: Self-pay

## 2024-05-08 NOTE — Telephone Encounter (Signed)
 FYI Only or Action Required?: Action required by provider: request for appointment.  Patient was last seen in primary care on 05/04/2024 by Dettinger, Fonda LABOR, MD.  Called Nurse Triage reporting Fatigue.  Symptoms began several days ago.  Interventions attempted: Nothing.  Symptoms are: unchanged.  Triage Disposition: See Physician Within 24 Hours  Patient/caregiver understands and will follow disposition?: Yes      Copied from CRM #[id number]. Topic: Clinical - Red Word Triage >> May 08, 2024 12:49 PM Turkey B wrote: [facility] that prompted transfer to Nurse Triage: patient has extreme fatigued and is nauseated Reason for Disposition  [1] MODERATE weakness (e.g., interferes with work, school, normal activities) AND [2] persists > 3 days  Answer Assessment - Initial Assessment Questions Appointment made Patient reports feeling weak and nausea. Denies feeling faint, HA, SOB Patient reports only felt weak when low BP or UTI. Last BP reading yesterday: 103/72 , hr 82 1. DESCRIPTION: Describe how you are feeling.    Currently feeling fatigue and nauseated. 2. SEVERITY: How bad is it?  Can you stand and walk?     Patient reports yes to stand and walk, doing household chores, just can't do a lot or work out. 3. ONSET: When did these symptoms begin? (e.g., hours, days, weeks, months)     Last Tuesday 4. CAUSE: What do you think is causing the weakness or fatigue? (e.g., not drinking enough fluids, medical problem, trouble sleeping)     Low bp or possible uti 5. NEW MEDICINES:  Have you started on any new medicines recently? (e.g., opioid pain medicines, benzodiazepines, muscle relaxants, antidepressants, antihistamines, neuroleptics, beta blockers)     Finasteride for hair loss; not new, but couldn't take for a while due to previous low bp. Bupropion  been taking for the last 3 weeks; for smoking cessation; no missed doses. Drinking caffeine, possibly UTI; felt like this  last time 6. OTHER SYMPTOMS: Do you have any other symptoms? (e.g., chest pain, fever, cough, SOB, vomiting, diarrhea, bleeding, other areas of pain)     Hx fibromyalgia;no pain currently  Protocols used: Weakness (Generalized) and Fatigue-A-AH

## 2024-05-08 NOTE — Telephone Encounter (Signed)
 Appt made.

## 2024-05-09 ENCOUNTER — Ambulatory Visit (INDEPENDENT_AMBULATORY_CARE_PROVIDER_SITE_OTHER): Admitting: Family Medicine

## 2024-05-09 ENCOUNTER — Encounter: Payer: Self-pay | Admitting: Family Medicine

## 2024-05-09 DIAGNOSIS — N390 Urinary tract infection, site not specified: Secondary | ICD-10-CM

## 2024-05-09 LAB — URINALYSIS, ROUTINE W REFLEX MICROSCOPIC
Bilirubin, UA: NEGATIVE
Glucose, UA: NEGATIVE
Ketones, UA: NEGATIVE
Leukocytes,UA: NEGATIVE
Nitrite, UA: NEGATIVE
Protein,UA: NEGATIVE
Specific Gravity, UA: 1.01 (ref 1.005–1.030)
Urobilinogen, Ur: 0.2 mg/dL (ref 0.2–1.0)
pH, UA: 6 (ref 5.0–7.5)

## 2024-05-09 LAB — MICROSCOPIC EXAMINATION: WBC, UA: NONE SEEN /HPF (ref 0–5)

## 2024-05-09 MED ORDER — CEPHALEXIN 500 MG PO CAPS: 500.0000 mg | ORAL_CAPSULE | Freq: Four times a day (QID) | ORAL | 0 refills | Status: DC

## 2024-05-09 NOTE — Progress Notes (Signed)
 BP 102/68   Pulse 80   Temp 98.5 F (36.9 C)   Ht 5' 4 (1.626 m)   Wt 128 lb (58.1 kg)   SpO2 97%   BMI 21.97 kg/m    Subjective:   Patient ID: Ruth Hopkins, female    DOB: 05-Jun-1966, 58 y.o.   MRN: [id number]  HPI: Ruth Hopkins is a 58 y.o. female presenting on 05/09/2024 for Fatigue (Nausea/Concern for UTI)   Discussed the use of AI scribe software for clinical note transcription with the patient, who gave verbal consent to proceed.  History of Present Illness   Ruth Hopkins is a 58 year old female who presents with urinary symptoms and low blood pressure.  She has been experiencing urinary symptoms, including a sensation of incomplete bladder emptying and occasional urgency after urination. No burning, pain, or hematuria. She has had two urinary tract infections in the past month, with cultures showing some abnormalities.  She has a history of low blood pressure, which had normalized after a hospitalization in February 2024, but is now dropping again. Her blood pressure was 102/68 today and has been lower previously. She associates her low blood pressure with stress, inadequate sleep, and dehydration.  She describes a recent episode of feeling extremely unwell, suspecting COVID-19, but tested negative. Symptoms included severe fatigue and nausea, but no vomiting or diarrhea. She attributes this episode to physical exertion the previous day and possibly dehydration.  She consumes a lot of caffeinated beverages, such as Diet Coke and tea, and acknowledges not drinking enough water, which may contribute to dehydration. Her BUN was elevated during a recent visit.  She has a history of constipation and mentions a possible diagnosis of Crohn's disease, which causes generalized abdominal pain but no specific worsening of symptoms.  She is experiencing significant stress due to managing household clutter and is physically active in organizing her home.          Relevant past  medical, surgical, family and social history reviewed and updated as indicated. Interim medical history since our last visit reviewed. Allergies and medications reviewed and updated.  Review of Systems  Constitutional:  Negative for chills and fever.  Eyes:  Negative for visual disturbance.  Respiratory:  Negative for chest tightness and shortness of breath.   Cardiovascular:  Negative for chest pain and leg swelling.  Musculoskeletal:  Negative for back pain and gait problem.  Skin:  Negative for rash.  Neurological:  Negative for dizziness, light-headedness and headaches.  Psychiatric/Behavioral:  Negative for agitation and behavioral problems.   All other systems reviewed and are negative.   Per HPI unless specifically indicated above   Allergies as of 05/09/2024       Reactions   Latex Itching, Rash   Codeine    Gabapentin     fuzzy   Sulfur Itching   Sulfa Antibiotics Hives, Itching, Rash        Medication List        Accurate as of May 09, 2024 12:37 PM. If you have any questions, ask your nurse or doctor.          STOP taking these medications    nirmatrelvir /ritonavir  20 x 150 MG & 10 x 100MG  Tabs Commonly known as: PAXLOVID  Stopped by: Fonda LABOR Francina Beery       TAKE these medications    acetaminophen  500 MG tablet Commonly known as: TYLENOL  Take by mouth.   buPROPion  150 MG 24 hr tablet Commonly known as:  WELLBUTRIN  XL Take 150 mg by mouth.   cephALEXin  500 MG capsule Commonly known as: KEFLEX  Take 1 capsule (500 mg total) by mouth 4 (four) times daily. Started by: Fonda LABOR Jacksen Isip   clonazePAM  0.5 MG tablet Commonly known as: KLONOPIN  Take 1 tablet (0.5 mg total) by mouth 2 (two) times daily.   cycloSPORINE 0.05 % ophthalmic emulsion Commonly known as: RESTASIS Restasis 0.05 % eye drops in a dropperette  USE 1 DROP INTO BOTH EYES TWICE A DAY   diclofenac  75 MG EC tablet Commonly known as: VOLTAREN  Take 1 tablet (75 mg total) by  mouth 2 (two) times daily.   eletriptan  40 MG tablet Commonly known as: RELPAX  TAKE 1 TABLET BY MOUTH EVERY 2 HOURS AS NEEDED FOR MIGRAINE HEADACHE . DO NOT EXCEED 6 PER 24 HOURS   estradiol  0.075 MG/24HR Commonly known as: VIVELLE -DOT Place 1 patch onto the skin 2 (two) times a week.   finasteride 5 MG tablet Commonly known as: PROSCAR Take by mouth.   Fluocinolone  Acetonide 0.01 % Oil Commonly known as: Flac  Place 1 drop in ear(s) daily. Place 1-2 drop in right ear   fluticasone  50 MCG/ACT nasal spray Commonly known as: FLONASE  Place 2 sprays into both nostrils daily.   imipramine  10 MG tablet Commonly known as: TOFRANIL  Take 3 tablets (30 mg total) by mouth at bedtime.   levothyroxine  50 MCG tablet Commonly known as: SYNTHROID  TAKE 1 TABLET BY MOUTH ONCE DAILY BEFORE BREAKFAST   methocarbamol  500 MG tablet Commonly known as: ROBAXIN  Take 1 tablet (500 mg total) by mouth 4 (four) times daily.   mometasone  50 MCG/ACT nasal spray Commonly known as: NASONEX  PLACE 2 SPRAYS INTO THE NOSE DAILY.   MULTIVITAMIN PO Take by mouth.   omeprazole  40 MG capsule Commonly known as: PRILOSEC Take 1 capsule (40 mg total) by mouth daily.   ondansetron  4 MG disintegrating tablet Commonly known as: ZOFRAN -ODT Take 1 tablet (4 mg total) by mouth every 8 (eight) hours as needed for nausea or vomiting.   tacrolimus  0.1 % ointment Commonly known as: PROTOPIC  tacrolimus  0.1 % topical ointment  APPLY OINTMENT TOPICALLY TO AFFECTED AREA TWICE DAILY   valACYclovir  500 MG tablet Commonly known as: VALTREX  Take 500 mg by mouth daily as needed.         Objective:   BP 102/68   Pulse 80   Temp 98.5 F (36.9 C)   Ht 5' 4 (1.626 m)   Wt 128 lb (58.1 kg)   SpO2 97%   BMI 21.97 kg/m   Wt Readings from Last 3 Encounters:  05/09/24 128 lb (58.1 kg)  04/26/24 129 lb (58.5 kg)  03/21/24 129 lb (58.5 kg)    Physical Exam Physical Exam   VITALS: BP- 102/68 NECK: Thyroid   normal on palpation. CHEST: Lungs clear to auscultation bilaterally. CARDIOVASCULAR: Heart regular rate and rhythm, no murmurs. ABDOMEN: Abdomen non-tender on palpation. No costovertebral angle tenderness.         Assessment & Plan:   Problem List Items Addressed This Visit   None Visit Diagnoses       Frequent UTI       Relevant Medications   cephALEXin  (KEFLEX ) 500 MG capsule   Other Relevant Orders   Urine Culture   Urinalysis, Routine w reflex microscopic (Completed)           Hematuria and urinary symptoms Intermittent urinary symptoms with recent UTIs. Current urinalysis shows 2+ blood, minimal bacteria.  Based on having increased  blood, will run the culture but will also send for Keflex  that she can go ahead and take, encouraged hydration  Hypotension Recurrent hypotension with fatigue and nausea, exacerbated by exertion and dehydration. Blood pressure normalized post-hospitalization but now recurrent. Stress and inadequate hydration may contribute. - Advised reduction of caffeinated drinks and increase intake of non-caffeinated fluids, especially in hot weather. - Suggested drinking flavored waters, lemonades, or Gatorade to improve hydration and electrolyte balance.  Dehydration Elevated BUN suggests possible dehydration due to high caffeine intake and inadequate hydration. Dehydration may contribute to hypotension and malaise. - Encouraged increased intake of non-caffeinated fluids to improve hydration. - Recommended Gatorade or similar electrolyte drinks to maintain electrolyte balance, especially during summer.          Follow up plan: Return if symptoms worsen or fail to improve.  Counseling provided for all of the vaccine components Orders Placed This Encounter  Procedures   Urine Culture   Microscopic Examination   Urinalysis, Routine w reflex microscopic    Fonda Levins, MD [facility] 05/09/2024, 12:37 PM

## 2024-05-10 ENCOUNTER — Other Ambulatory Visit: Payer: Self-pay | Admitting: Family

## 2024-05-10 DIAGNOSIS — L299 Pruritus, unspecified: Secondary | ICD-10-CM

## 2024-05-11 ENCOUNTER — Ambulatory Visit: Payer: Self-pay | Admitting: Family Medicine

## 2024-05-11 LAB — URINE CULTURE: Organism ID, Bacteria: NO GROWTH

## 2024-06-04 DIAGNOSIS — R92323 Mammographic fibroglandular density, bilateral breasts: Secondary | ICD-10-CM | POA: Diagnosis not present

## 2024-06-04 DIAGNOSIS — R92333 Mammographic heterogeneous density, bilateral breasts: Secondary | ICD-10-CM | POA: Diagnosis not present

## 2024-06-04 DIAGNOSIS — N6310 Unspecified lump in the right breast, unspecified quadrant: Secondary | ICD-10-CM | POA: Diagnosis not present

## 2024-06-04 DIAGNOSIS — N6001 Solitary cyst of right breast: Secondary | ICD-10-CM | POA: Diagnosis not present

## 2024-06-04 DIAGNOSIS — Z9189 Other specified personal risk factors, not elsewhere classified: Secondary | ICD-10-CM | POA: Diagnosis not present

## 2024-06-04 DIAGNOSIS — Z86000 Personal history of in-situ neoplasm of breast: Secondary | ICD-10-CM | POA: Diagnosis not present

## 2024-06-07 ENCOUNTER — Encounter: Payer: Self-pay | Admitting: Family

## 2024-06-07 ENCOUNTER — Ambulatory Visit (INDEPENDENT_AMBULATORY_CARE_PROVIDER_SITE_OTHER): Admitting: Family

## 2024-06-07 ENCOUNTER — Telehealth: Payer: Self-pay

## 2024-06-07 ENCOUNTER — Ambulatory Visit: Payer: Self-pay

## 2024-06-07 ENCOUNTER — Other Ambulatory Visit (HOSPITAL_COMMUNITY): Payer: Self-pay

## 2024-06-07 VITALS — BP 98/62 | HR 82 | Temp 97.2°F | Ht 64.0 in | Wt 130.2 lb

## 2024-06-07 DIAGNOSIS — M25552 Pain in left hip: Secondary | ICD-10-CM

## 2024-06-07 DIAGNOSIS — M5432 Sciatica, left side: Secondary | ICD-10-CM

## 2024-06-07 MED ORDER — PREDNISONE 10 MG (21) PO TBPK: ORAL_TABLET | ORAL | 0 refills | Status: DC

## 2024-06-07 MED ORDER — METHOCARBAMOL 500 MG PO TABS
500.0000 mg | ORAL_TABLET | Freq: Four times a day (QID) | ORAL | 0 refills | Status: AC
Start: 2024-06-07 — End: ?

## 2024-06-07 NOTE — Telephone Encounter (Signed)
 Pharmacy Patient Advocate Encounter  Received notification from [facility] that Prior Authorization for METHOCARBAMOL  TABS  has been DENIED.  Full denial letter will be uploaded to the media tab. See denial reason below.   PA #/Case ID/Reference #: [id number]

## 2024-06-07 NOTE — Telephone Encounter (Signed)
 Appt made.

## 2024-06-07 NOTE — Patient Instructions (Signed)

## 2024-06-07 NOTE — Telephone Encounter (Signed)
 FYI Only or Action Required?: FYI only for provider.  Patient was last seen in primary care on 05/09/2024 by Dettinger, Fonda LABOR, MD.  Called Nurse Triage reporting Hip Pain.  Symptoms began today.  Interventions attempted: Nothing.  Symptoms are: gradually worsening.  Triage Disposition: See HCP Within 4 Hours (Or PCP Triage)  Patient/caregiver understands and will follow disposition?: Yes    Copied from CRM [id number]. Topic: Clinical - Red Word Triage >> Jun 07, 2024 11:50 AM DeAngela L wrote: [facility] Word that prompted transfer to Nurse Triage: patient woke up to extreme hip pain and now the pain is in her inner thigh left leg only and the patient worried its a blood clot concern   Pt num [phone] (M) Reason for Disposition  [1] SEVERE pain (e.g., excruciating, unable to do any normal activities) AND [2] not improved after 2 hours of pain medicine  Answer Assessment - Initial Assessment Questions Patient denies redness, chest pain ,numbness, weakness, or difficulty breathing.   1. LOCATION and RADIATION: Where is the pain located? Does the pain spread (shoot) anywhere else?     L hip pain travels to upper inner thigh area 2. QUALITY: What does the pain feel like?  (e.g., sharp, dull, aching, burning)     Achy pain  3. SEVERITY: How bad is the pain? What does it keep you from doing?   (Scale 1-10; or mild, moderate, severe)     7 or 8 out of 10  4. ONSET: When did the pain start? Does it come and go, or is it there all the time?     Early this morning  8. OTHER SYMPTOMS: Do you have any other symptoms? (e.g., back pain, pain shooting down leg,  fever, rash)     Had calf pain recently that is now resolved  Protocols used: Hip Pain-A-AH

## 2024-06-07 NOTE — Progress Notes (Signed)
 Subjective:    Patient ID: Ruth Hopkins, female    DOB: Feb 12, 1966, 58 y.o.   MRN: [id number]  Chief Complaint  Patient presents with   Pain    LEFT HIP AND LEG PAIN   Pt presents to the office today with left hip pain that is radiating down her thigh.  Hip Pain  The incident occurred 12 to 24 hours ago. There was no injury mechanism. The pain is present in the left hip and left thigh. The quality of the pain is described as aching. The pain is at a severity of 9/10. The pain is moderate. The pain has been Intermittent since onset. Associated symptoms include numbness and tingling. Pertinent negatives include no loss of motion or loss of sensation. She reports no foreign bodies present. The symptoms are aggravated by movement. She has tried acetaminophen  for the symptoms. The treatment provided mild relief.      Review of Systems  Neurological:  Positive for tingling and numbness.  All other systems reviewed and are negative.   Social History   Socioeconomic History   Marital status: Married    Spouse name: Not on file   Number of children: 0   Years of education: Not on file   Highest education level: Associate degree: occupational, Scientist, product/process development, or vocational program  Occupational History   Occupation: disability  Tobacco Use   Smoking status: Every Day    Current packs/day: 1.00    Average packs/day: 1 pack/day for 46.6 years (46.6 ttl pk-yrs)    Types: Cigarettes    Start date: 11/02/1977   Smokeless tobacco: Never   Tobacco comments:    Smoking since age 84 - 1ppd usually - has tried to quit several times - is afraid it will make her gain weight    Started smoking again a few weeks ago, maybe 15 ciggs per day --12/16/2022  Vaping Use   Vaping status: Never Used  Substance and Sexual Activity   Alcohol use: No   Drug use: No   Sexual activity: Not on file  Other Topics Concern   Not on file  Social History Narrative   Lives home with husband in one level home   Her  mother and sister live nearby   [place] faith   Social Drivers of Health   Financial Resource Strain: Low Risk  (03/20/2024)   Overall Financial Resource Strain (CARDIA)    Difficulty of Paying Living Expenses: Not hard at all  Food Insecurity: No Food Insecurity (03/20/2024)   Hunger Vital Sign    Worried About Running Out of Food in the Last Year: Never true    Ran Out of Food in the Last Year: Never true  Transportation Needs: No Transportation Needs (03/20/2024)   PRAPARE - Administrator, Civil Service (Medical): No    Lack of Transportation (Non-Medical): No  Physical Activity: Insufficiently Active (03/20/2024)   Exercise Vital Sign    Days of Exercise per Week: 2 days    Minutes of Exercise per Session: 30 min  Stress: No Stress Concern Present (03/20/2024)   [employer] of Occupational Health - Occupational Stress Questionnaire    Feeling of Stress: Only a little  Recent Concern: Stress - Stress Concern Present (01/16/2024)   [employer] of Occupational Health - Occupational Stress Questionnaire    Feeling of Stress : To some extent  Social Connections: Moderately Integrated (03/20/2024)   Social Connection and Isolation Panel    Frequency of Communication  with Friends and Family: More than three times a week    Frequency of Social Gatherings with Friends and Family: Twice a week    Attends Religious Services: More than 4 times per year    Active Member of [employer] or Organizations: No    Attends Engineer, structural: Not on file    Marital Status: Married   Family History  Problem Relation Age of Onset   Asthma Mother    Hypothyroidism Mother    Hypertension Mother    Raynaud syndrome Mother    Hypertension Father         Objective:   Physical Exam Vitals reviewed.  Constitutional:      General: She is not in acute distress.    Appearance: She is well-developed.  HENT:     Head: Normocephalic and atraumatic.  Eyes:     Pupils:  Pupils are equal, round, and reactive to light.  Neck:     Thyroid : No thyromegaly.  Cardiovascular:     Rate and Rhythm: Normal rate and regular rhythm.     Heart sounds: Normal heart sounds. No murmur heard. Pulmonary:     Effort: Pulmonary effort is normal. No respiratory distress.     Breath sounds: Normal breath sounds. No wheezing.  Abdominal:     General: Bowel sounds are normal. There is no distension.     Palpations: Abdomen is soft.     Tenderness: There is no abdominal tenderness.  Musculoskeletal:        General: Tenderness present. Normal range of motion.     Cervical back: Normal range of motion and neck supple.     Comments: Full ROM of left hip  Skin:    General: Skin is warm and dry.  Neurological:     Mental Status: She is alert and oriented to person, place, and time.     Cranial Nerves: No cranial nerve deficit.     Deep Tendon Reflexes: Reflexes are normal and symmetric.  Psychiatric:        Behavior: Behavior normal.        Thought Content: Thought content normal.        Judgment: Judgment normal.       BP 98/62   Pulse 82   Temp (!) 97.2 F (36.2 C)   Ht 5' 4 (1.626 m)   Wt 130 lb 3.2 oz (59.1 kg)   SpO2 98%   BMI 22.35 kg/m      Assessment & Plan:  Ruth Hopkins comes in today with chief complaint of Pain (LEFT HIP AND LEG PAIN)   Diagnosis and orders addressed:  1. Left hip pain (Primary) - predniSONE  (STERAPRED UNI-PAK 21 TAB) 10 MG (21) TBPK tablet; Use as directed  Dispense: 21 tablet; Refill: 0 - methocarbamol  (ROBAXIN ) 500 MG tablet; Take 1 tablet (500 mg total) by mouth 4 (four) times daily.  Dispense: 20 tablet; Refill: 0  2. Sciatica of left side - methocarbamol  (ROBAXIN ) 500 MG tablet; Take 1 tablet (500 mg total) by mouth 4 (four) times daily.  Dispense: 20 tablet; Refill: 0  Rest Ice  ROM exercises encouraged Start prednisone  Follow up if symptoms worsen or do not improve    Bari Learn, FNP

## 2024-06-07 NOTE — Telephone Encounter (Signed)
 Pharmacy Patient Advocate Encounter   Received notification from CoverMyMeds that prior authorization for Methocarbamol  500MG  tablets is required/requested.   Insurance verification completed.   The patient is insured through [facility] .   Per test claim: PA required; PA submitted to above mentioned insurance via Latent Key/confirmation #/EOC [id number] Status is pending

## 2024-06-19 DIAGNOSIS — R92333 Mammographic heterogeneous density, bilateral breasts: Secondary | ICD-10-CM | POA: Diagnosis not present

## 2024-06-19 DIAGNOSIS — Z9189 Other specified personal risk factors, not elsewhere classified: Secondary | ICD-10-CM | POA: Diagnosis not present

## 2024-06-19 DIAGNOSIS — N6001 Solitary cyst of right breast: Secondary | ICD-10-CM | POA: Diagnosis not present

## 2024-06-19 DIAGNOSIS — Z86000 Personal history of in-situ neoplasm of breast: Secondary | ICD-10-CM | POA: Diagnosis not present

## 2024-06-20 ENCOUNTER — Encounter: Payer: Self-pay | Admitting: Podiatry

## 2024-06-20 ENCOUNTER — Ambulatory Visit (INDEPENDENT_AMBULATORY_CARE_PROVIDER_SITE_OTHER)

## 2024-06-20 ENCOUNTER — Ambulatory Visit (INDEPENDENT_AMBULATORY_CARE_PROVIDER_SITE_OTHER): Admitting: Podiatry

## 2024-06-20 DIAGNOSIS — M7662 Achilles tendinitis, left leg: Secondary | ICD-10-CM | POA: Diagnosis not present

## 2024-06-20 DIAGNOSIS — M7751 Other enthesopathy of right foot: Secondary | ICD-10-CM | POA: Diagnosis not present

## 2024-06-20 DIAGNOSIS — M7752 Other enthesopathy of left foot: Secondary | ICD-10-CM | POA: Diagnosis not present

## 2024-06-20 MED ORDER — TRIAMCINOLONE ACETONIDE 10 MG/ML IJ SUSP
10.0000 mg | Freq: Once | INTRAMUSCULAR | Status: AC
  Administered 2024-06-20: 10 mg via INTRA_ARTICULAR

## 2024-06-20 NOTE — Progress Notes (Signed)
 Subjective:   Patient ID: Ruth Hopkins, female   DOB: 58 y.o.   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  No indication of muscle strength loss of the Achilles or range of motion loss.  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  I then went ahead did sterile prep and injected the sinus bilateral tarsi 3 mg dexamethasone Kenalog  5 mg Xylocaine applied sterile dressing bilateral and reappoint to recheck  X-ray left was negative for signs of acute issue associated with injury of 2 weeks spur present but not detached

## 2024-06-21 ENCOUNTER — Ambulatory Visit

## 2024-06-21 VITALS — BP 105/72 | HR 76 | Ht 64.0 in | Wt 130.0 lb

## 2024-06-21 DIAGNOSIS — Z0000 Encounter for general adult medical examination without abnormal findings: Secondary | ICD-10-CM

## 2024-06-21 NOTE — Progress Notes (Signed)
 Subjective:   Ruth Hopkins is a 58 y.o. who presents for a Medicare Wellness preventive visit.  As a reminder, Annual Wellness Visits don't include a physical exam, and some assessments may be limited, especially if this visit is performed virtually. We may recommend an in-person follow-up visit with your provider if needed.  Visit Complete: Virtual I connected with  Sari VEAR Pereyra on 06/21/24 by a audio enabled telemedicine application and verified that I am speaking with the correct person using two identifiers.  Patient Location: Home  Provider Location: Home Office  I discussed the limitations of evaluation and management by telemedicine. The patient expressed understanding and agreed to proceed.  Vital Signs: Because this visit was a virtual/telehealth visit, some criteria may be missing or patient reported. Any vitals not documented were not able to be obtained and vitals that have been documented are patient reported.  VideoDeclined- This patient declined Librarian, academic. Therefore the visit was completed with audio only.  Persons Participating in Visit: Patient.  AWV Questionnaire:  Yes: Patient Medicare AWV questionnaire was 06/17/24 completed prior to this visit.      Objective:    Today's Vitals   06/21/24 1310  BP: 105/72  Pulse: 76  Weight: 130 lb (59 kg)  Height: 5' 4 (1.626 m)   Body mass index is 22.31 kg/m.     06/21/2024    1:17 PM 04/13/2023   12:05 PM 02/19/2022   11:25 AM 02/18/2021   11:32 AM  Advanced Directives  Does Patient Have a Medical Advance Directive? No No No No  Would patient like information on creating a medical advance directive?  Yes (MAU/Ambulatory/Procedural Areas - Information given) No - Patient declined No - Patient declined    Current Medications (verified) Outpatient Encounter Medications as of 06/21/2024  Medication Sig   acetaminophen  (TYLENOL ) 500 MG tablet Take by mouth.   buPROPion  (WELLBUTRIN   XL) 150 MG 24 hr tablet Take 150 mg by mouth.   cephALEXin  (KEFLEX ) 500 MG capsule Take 1 capsule (500 mg total) by mouth 4 (four) times daily.   clonazePAM  (KLONOPIN ) 0.5 MG tablet Take 1 tablet (0.5 mg total) by mouth 2 (two) times daily.   cycloSPORINE (RESTASIS) 0.05 % ophthalmic emulsion Restasis 0.05 % eye drops in a dropperette  USE 1 DROP INTO BOTH EYES TWICE A DAY   diclofenac  (VOLTAREN ) 75 MG EC tablet Take 1 tablet (75 mg total) by mouth 2 (two) times daily.   eletriptan  (RELPAX ) 40 MG tablet TAKE 1 TABLET BY MOUTH EVERY 2 HOURS AS NEEDED FOR MIGRAINE HEADACHE . DO NOT EXCEED 6 PER 24 HOURS   estradiol  (VIVELLE -DOT) 0.075 MG/24HR Place 1 patch onto the skin 2 (two) times a week.   finasteride (PROSCAR) 5 MG tablet Take by mouth.   Fluocinolone  Acetonide 0.01 % OIL place 1 or 2 drops in right ear daily AS DIRECTED.   fluticasone  (FLONASE ) 50 MCG/ACT nasal spray Place 2 sprays into both nostrils daily.   imipramine  (TOFRANIL ) 10 MG tablet Take 3 tablets (30 mg total) by mouth at bedtime.   levothyroxine  (SYNTHROID ) 50 MCG tablet TAKE 1 TABLET BY MOUTH ONCE DAILY BEFORE BREAKFAST   methocarbamol  (ROBAXIN ) 500 MG tablet Take 1 tablet (500 mg total) by mouth 4 (four) times daily.   mometasone  (NASONEX ) 50 MCG/ACT nasal spray PLACE 2 SPRAYS INTO THE NOSE DAILY.   Multiple Vitamins-Minerals (MULTIVITAMIN PO) Take by mouth.   omeprazole  (PRILOSEC) 40 MG capsule Take 1 capsule (40  mg total) by mouth daily.   ondansetron  (ZOFRAN -ODT) 4 MG disintegrating tablet Take 1 tablet (4 mg total) by mouth every 8 (eight) hours as needed for nausea or vomiting.   predniSONE  (STERAPRED UNI-PAK 21 TAB) 10 MG (21) TBPK tablet Use as directed   tacrolimus  (PROTOPIC ) 0.1 % ointment tacrolimus  0.1 % topical ointment  APPLY OINTMENT TOPICALLY TO AFFECTED AREA TWICE DAILY   valACYclovir  (VALTREX ) 500 MG tablet Take 500 mg by mouth daily as needed.   No facility-administered encounter medications on file as of  06/21/2024.    Allergies (verified) Latex, Codeine, Gabapentin , Sulfur, and Sulfa antibiotics   History: Past Medical History:  Diagnosis Date   Allergy    Anemia 09/2022   Was hospitalized   Anxiety 1985   History of major depression   Arthritis Unsure   Ongoing   Chronic fatigue    Depression    Fibromyalgia    GERD (gastroesophageal reflux disease) Unsure   Ongoing   Hyperlipidemia 2024   IBS (irritable bowel syndrome)    Migraine    Seizures (HCC) 1978   Thyroid  disease    Past Surgical History:  Procedure Laterality Date   BREAST SURGERY Right 2007   lumpectomy   COSMETIC SURGERY  1988   FOOT SURGERY     Family History  Problem Relation Age of Onset   Asthma Mother    Hypothyroidism Mother    Hypertension Mother    Raynaud syndrome Mother    COPD Mother    Hearing loss Mother    Varicose Veins Mother    Hypertension Father    Anxiety disorder Father    Arthritis Father    Hyperlipidemia Father    Kidney disease Father    Diabetes Maternal Grandfather    Cancer Sister    Miscarriages / Stillbirths Sister    Obesity Sister    Social History   Socioeconomic History   Marital status: Married    Spouse name: Not on file   Number of children: 0   Years of education: Not on file   Highest education level: Associate degree: occupational, Scientist, product/process development, or vocational program  Occupational History   Occupation: disability  Tobacco Use   Smoking status: Every Day    Current packs/day: 1.00    Average packs/day: 1 pack/day for 46.6 years (46.6 ttl pk-yrs)    Types: Cigarettes    Start date: 11/02/1977   Smokeless tobacco: Never   Tobacco comments:    Smoking since age 63 - 1ppd usually - has tried to quit several times - is afraid it will make her gain weight    Started smoking again a few weeks ago, maybe 15 ciggs per day --12/16/2022  Vaping Use   Vaping status: Never Used  Substance and Sexual Activity   Alcohol use: No   Drug use: No   Sexual  activity: Yes    Birth control/protection: None  Other Topics Concern   Not on file  Social History Narrative   Lives home with husband in one level home   Her mother and sister live nearby   [place] faith   Social Drivers of Health   Financial Resource Strain: Low Risk  (06/21/2024)   Overall Financial Resource Strain (CARDIA)    Difficulty of Paying Living Expenses: Not hard at all  Food Insecurity: No Food Insecurity (06/21/2024)   Hunger Vital Sign    Worried About Running Out of Food in the Last Year: Never true  Ran Out of Food in the Last Year: Never true  Transportation Needs: No Transportation Needs (06/21/2024)   PRAPARE - Administrator, Civil Service (Medical): No    Lack of Transportation (Non-Medical): No  Physical Activity: Insufficiently Active (06/21/2024)   Exercise Vital Sign    Days of Exercise per Week: 2 days    Minutes of Exercise per Session: 30 min  Stress: No Stress Concern Present (06/21/2024)   [employer] of Occupational Health - Occupational Stress Questionnaire    Feeling of Stress: Not at all  Social Connections: Moderately Integrated (06/21/2024)   Social Connection and Isolation Panel    Frequency of Communication with Friends and Family: More than three times a week    Frequency of Social Gatherings with Friends and Family: Twice a week    Attends Religious Services: More than 4 times per year    Active Member of [employer] or Organizations: No    Attends Engineer, structural: Never    Marital Status: Married    Tobacco Counseling Ready to quit: Yes Counseling given: Yes Tobacco comments: Smoking since age 49 - 1ppd usually - has tried to quit several times - is afraid it will make her gain weight Started smoking again a few weeks ago, maybe 15 ciggs per day --12/16/2022    Clinical Intake:  Pre-visit preparation completed: Yes  Pain : No/denies pain     BMI - recorded: 22.31 Nutritional Status: BMI of  19-24  Normal Nutritional Risks: None Diabetes: No  No results found for: HGBA1C   How often do you need to have someone help you when you read instructions, pamphlets, or other written materials from your doctor or pharmacy?: 1 - Never  Interpreter Needed?: No  Information entered by :: alia t/cma   Activities of Daily Living     06/17/2024    8:50 PM  In your present state of health, do you have any difficulty performing the following activities:  Hearing? 0  Vision? 0  Difficulty concentrating or making decisions? 0  Walking or climbing stairs? 0  Dressing or bathing? 0  Doing errands, shopping? 0  Preparing Food and eating ? N  Using the Toilet? N  In the past six months, have you accidently leaked urine? N  Do you have problems with loss of bowel control? N  Managing your Medications? N  Managing your Finances? N  Housekeeping or managing your Housekeeping? N    Patient Care Team: Lavell Bari LABOR, FNP as PCP - General (Family Medicine) Johnnye Ade, MD as Consulting Physician (Obstetrics and Gynecology) Livingston Rigg, MD as Consulting Physician (Dermatology) Bonner Ade, MD as Consulting Physician (Physical Medicine and Rehabilitation) Zackary Katheryn LELON DEVONNA (Hematology and Oncology) Darden Planas, NP as Nurse Practitioner (Psychology) Magdalen Pasco RAMAN, DPM as Consulting Physician (Podiatry)  I have updated your Care Teams any recent Medical Services you may have received from other providers in the past year.     Assessment:   This is a routine wellness examination for Jenia.  Hearing/Vision screen Hearing Screening - Comments:: Pt denies hearing dif  Vision Screening - Comments:: Pt uses reading glasses for close up/ pt goes [facility] Dr Vicci, last ov 01/2024   Goals Addressed             This Visit's Progress    Quit Smoking   On track    Back up to 1 ppd       Depression Screen  06/21/2024    1:18 PM 06/07/2024    2:31 PM  05/09/2024   11:07 AM 04/26/2024   11:27 AM 03/21/2024    2:23 PM 01/17/2024   11:16 AM 12/28/2023    1:21 PM  PHQ 2/9 Scores  PHQ - 2 Score 2 1 1 1  0 0 1  PHQ- 9 Score 3 2 2 3  0  2    Fall Risk     06/17/2024    8:50 PM 06/07/2024    2:31 PM 05/09/2024   11:08 AM 04/26/2024   11:26 AM 03/21/2024    2:23 PM  Fall Risk   Falls in the past year? 0 0 0 0 0  Number falls in past yr:  0 0 0   Injury with Fall?  0 0 0   Risk for fall due to : No Fall Risks No Fall Risks No Fall Risks No Fall Risks   Follow up Falls evaluation completed;Education provided Falls evaluation completed Falls evaluation completed Falls evaluation completed     MEDICARE RISK AT HOME:  Medicare Risk at Home Any stairs in or around the home?: (Patient-Rptd) No Home free of loose throw rugs in walkways, pet beds, electrical cords, etc?: (Patient-Rptd) Yes Adequate lighting in your home to reduce risk of falls?: (Patient-Rptd) Yes Life alert?: (Patient-Rptd) No Use of a cane, walker or w/c?: (Patient-Rptd) No Grab bars in the bathroom?: (Patient-Rptd) No Shower chair or bench in shower?: (Patient-Rptd) No Elevated toilet seat or a handicapped toilet?: (Patient-Rptd) No  TIMED UP AND GO:  Was the test performed?  no  Cognitive Function: 6CIT completed        06/21/2024    1:17 PM 04/13/2023   12:06 PM 02/19/2022   11:26 AM  6CIT Screen  What Year? 0 points 0 points 0 points  What month? 0 points 0 points 0 points  What time? 0 points 0 points 0 points  Count back from 20 0 points 0 points 0 points  Months in reverse 0 points 0 points 0 points  Repeat phrase 2 points 0 points 0 points  Total Score 2 points 0 points 0 points    Immunizations Immunization History  Administered Date(s) Administered   Influenza Inj Mdck Quad With Preservative 06/26/2019   Influenza, Seasonal, Injecte, Preservative Fre 07/18/2023   Influenza,inj,Quad PF,6+ Mos 08/05/2018, 07/24/2020   Influenza-Unspecified 08/05/2018,  07/24/2020   MMR 07/07/2000   Td 05/11/2021   Td (Adult), 2 Lf Tetanus Toxid, Preservative Free 04/14/2000   Td (Adult),5 Lf Tetanus Toxid, Preservative Free 05/11/2021   Tdap 04/14/2000    Screening Tests Health Maintenance  Topic Date Due   Cervical Cancer Screening (HPV/Pap Cotest)  07/23/2024   COVID-19 Vaccine (1) 06/23/2024 (Originally 05/25/1970)   Zoster Vaccines- Shingrix (1 of 2) 09/06/2024 (Originally 05/25/1984)   Influenza Vaccine  12/18/2024 (Originally 04/20/2024)   Hepatitis B Vaccines 19-59 Average Risk (1 of 3 - 19+ 3-dose series) 04/26/2025 (Originally 05/25/1984)   Pneumococcal Vaccine: 50+ Years (1 of 2 - PCV) 06/07/2025 (Originally 05/25/1984)   Lung Cancer Screening  07/31/2024   Medicare Annual Wellness (AWV)  06/21/2025   Colonoscopy  07/14/2028   DTaP/Tdap/Td (5 - Td or Tdap) 05/12/2031   Hepatitis C Screening  Completed   HIV Screening  Completed   HPV VACCINES  Aged Out   Meningococcal B Vaccine  Aged Out   Mammogram  Discontinued    Health Maintenance Items Addressed: See Nurse Notes at the end of this note  Additional Screening:  Vision Screening: Recommended annual ophthalmology exams for early detection of glaucoma and other disorders of the eye. Is the patient up to date with their annual eye exam?  Yes  Who is the provider or what is the name of the office in which the patient attends annual eye exams? [facility]  Dental Screening: Recommended annual dental exams for proper oral hygiene  Community Resource Referral / Chronic Care Management: CRR required this visit?  No   CCM required this visit?  No   Plan:    I have personally reviewed and noted the following in the patient's chart:   Medical and social history Use of alcohol, tobacco or illicit drugs  Current medications and supplements including opioid prescriptions. Patient is not currently taking opioid prescriptions. Functional ability and status Nutritional  status Physical activity Advanced directives List of other physicians Hospitalizations, surgeries, and ER visits in previous 12 months Vitals Screenings to include cognitive, depression, and falls Referrals and appointments  In addition, I have reviewed and discussed with patient certain preventive protocols, quality metrics, and best practice recommendations. A written personalized care plan for preventive services as well as general preventive health recommendations were provided to patient.   Ozie Ned, CMA   06/21/2024   After Visit Summary: (MyChart) Due to this being a telephonic visit, the after visit summary with patients personalized plan was offered to patient via MyChart   Notes: Nothing significant to report at this time.

## 2024-06-21 NOTE — Patient Instructions (Signed)
 Ruth Hopkins,  Thank you for taking the time for your Medicare Wellness Visit. I appreciate your continued commitment to your health goals. Please review the care plan we discussed, and feel free to reach out if I can assist you further.  Medicare recommends these wellness visits once per year to help you and your care team stay ahead of potential health issues. These visits are designed to focus on prevention, allowing your provider to concentrate on managing your acute and chronic conditions during your regular appointments.  Please note that Annual Wellness Visits do not include a physical exam. Some assessments may be limited, especially if the visit was conducted virtually. If needed, we may recommend a separate in-person follow-up with your provider.  Ongoing Care Seeing your primary care provider every 3 to 6 months helps us  monitor your health and provide consistent, personalized care.   Referrals If a referral was made during today's visit and you haven't received any updates within two weeks, please contact the referred provider directly to check on the status.  Recommended Screenings:  Health Maintenance  Topic Date Due   Medicare Annual Wellness Visit  04/12/2024   Pap with HPV screening  07/23/2024   COVID-19 Vaccine (1) 06/23/2024*   Zoster (Shingles) Vaccine (1 of 2) 09/06/2024*   Flu Shot  12/18/2024*   Hepatitis B Vaccine (1 of 3 - 19+ 3-dose series) 04/26/2025*   Pneumococcal Vaccine for age over 40 (1 of 2 - PCV) 06/07/2025*   Screening for Lung Cancer  07/31/2024   Colon Cancer Screening  07/14/2028   DTaP/Tdap/Td vaccine (5 - Td or Tdap) 05/12/2031   Hepatitis C Screening  Completed   HIV Screening  Completed   HPV Vaccine  Aged Out   Meningitis B Vaccine  Aged Out   Breast Cancer Screening  Discontinued  *Topic was postponed. The date shown is not the original due date.       06/21/2024    1:17 PM  Advanced Directives  Does Patient Have a Medical Advance  Directive? No   Advance Care Planning is important because it: Ensures you receive medical care that aligns with your values, goals, and preferences. Provides guidance to your family and loved ones, reducing the emotional burden of decision-making during critical moments.  Vision: Annual vision screenings are recommended for early detection of glaucoma, cataracts, and diabetic retinopathy. These exams can also reveal signs of chronic conditions such as diabetes and high blood pressure.  Dental: Annual dental screenings help detect early signs of oral cancer, gum disease, and other conditions linked to overall health, including heart disease and diabetes.  Please see the attached documents for additional preventive care recommendations.

## 2024-07-05 ENCOUNTER — Ambulatory Visit: Payer: Self-pay

## 2024-07-05 ENCOUNTER — Ambulatory Visit (INDEPENDENT_AMBULATORY_CARE_PROVIDER_SITE_OTHER): Admitting: Family

## 2024-07-05 VITALS — BP 104/65 | HR 87 | Temp 97.2°F | Ht 64.0 in | Wt 130.4 lb

## 2024-07-05 DIAGNOSIS — J439 Emphysema, unspecified: Secondary | ICD-10-CM

## 2024-07-05 DIAGNOSIS — F17200 Nicotine dependence, unspecified, uncomplicated: Secondary | ICD-10-CM

## 2024-07-05 DIAGNOSIS — J069 Acute upper respiratory infection, unspecified: Secondary | ICD-10-CM

## 2024-07-05 DIAGNOSIS — R350 Frequency of micturition: Secondary | ICD-10-CM

## 2024-07-05 DIAGNOSIS — R300 Dysuria: Secondary | ICD-10-CM | POA: Diagnosis not present

## 2024-07-05 MED ORDER — ALBUTEROL SULFATE HFA 108 (90 BASE) MCG/ACT IN AERS: 2.0000 | INHALATION_SPRAY | Freq: Four times a day (QID) | RESPIRATORY_TRACT | 0 refills | Status: AC | PRN

## 2024-07-05 MED ORDER — GUAIFENESIN ER 600 MG PO TB12: 1200.0000 mg | ORAL_TABLET | Freq: Two times a day (BID) | ORAL | 1 refills | Status: AC

## 2024-07-05 MED ORDER — CETIRIZINE HCL 10 MG PO TABS: 10.0000 mg | ORAL_TABLET | Freq: Every day | ORAL | 1 refills | Status: AC

## 2024-07-05 NOTE — Telephone Encounter (Signed)
 Pt has appt

## 2024-07-05 NOTE — Patient Instructions (Signed)

## 2024-07-05 NOTE — Progress Notes (Signed)
 Subjective:    Patient ID: Ruth Hopkins, female    DOB: 1965/09/22, 58 y.o.   MRN: [id number]  Chief Complaint  Patient presents with   Dysuria   Nasal Congestion    With little sob   Pt presents to the office today with urinary frequency that started a week ago.  Urinary Frequency  This is a new problem. The current episode started in the past 7 days. The problem occurs intermittently. The problem has been waxing and waning. Associated symptoms include frequency.  URI  This is a new problem. The current episode started in the past 7 days. The problem has been unchanged. There has been no fever. Associated symptoms include congestion, coughing and sneezing. Pertinent negatives include no ear pain, headaches, joint pain, rhinorrhea, sinus pain, sore throat or wheezing. Associated symptoms comments: fatigue. She has tried increased fluids for the symptoms. The treatment provided mild relief.      Review of Systems  HENT:  Positive for congestion and sneezing. Negative for ear pain, rhinorrhea, sinus pain and sore throat.   Respiratory:  Positive for cough. Negative for wheezing.   Genitourinary:  Positive for frequency.  Musculoskeletal:  Negative for joint pain.  Neurological:  Negative for headaches.  All other systems reviewed and are negative.   Social History   Socioeconomic History   Marital status: Married    Spouse name: Not on file   Number of children: 0   Years of education: Not on file   Highest education level: Associate degree: occupational, Scientist, product/process development, or vocational program  Occupational History   Occupation: disability  Tobacco Use   Smoking status: Every Day    Current packs/day: 1.00    Average packs/day: 1 pack/day for 46.7 years (46.7 ttl pk-yrs)    Types: Cigarettes    Start date: 11/02/1977   Smokeless tobacco: Never   Tobacco comments:    Smoking since age 51 - 1ppd usually - has tried to quit several times - is afraid it will make her gain weight     Started smoking again a few weeks ago, maybe 15 ciggs per day --12/16/2022  Vaping Use   Vaping status: Never Used  Substance and Sexual Activity   Alcohol use: No   Drug use: No   Sexual activity: Yes    Birth control/protection: None  Other Topics Concern   Not on file  Social History Narrative   Lives home with husband in one level home   Her mother and sister live nearby   [place] faith   Social Drivers of Health   Financial Resource Strain: Low Risk  (07/05/2024)   Overall Financial Resource Strain (CARDIA)    Difficulty of Paying Living Expenses: Not hard at all  Food Insecurity: No Food Insecurity (07/05/2024)   Hunger Vital Sign    Worried About Running Out of Food in the Last Year: Never true    Ran Out of Food in the Last Year: Never true  Transportation Needs: No Transportation Needs (07/05/2024)   PRAPARE - Administrator, Civil Service (Medical): No    Lack of Transportation (Non-Medical): No  Physical Activity: Insufficiently Active (07/05/2024)   Exercise Vital Sign    Days of Exercise per Week: 2 days    Minutes of Exercise per Session: 30 min  Stress: No Stress Concern Present (07/05/2024)   [employer] of Occupational Health - Occupational Stress Questionnaire    Feeling of Stress: Only a little  Social  Connections: Moderately Integrated (07/05/2024)   Social Connection and Isolation Panel    Frequency of Communication with Friends and Family: More than three times a week    Frequency of Social Gatherings with Friends and Family: More than three times a week    Attends Religious Services: More than 4 times per year    Active Member of [employer] or Organizations: No    Attends Engineer, structural: Not on file    Marital Status: Married   Family History  Problem Relation Age of Onset   Asthma Mother    Hypothyroidism Mother    Hypertension Mother    Raynaud syndrome Mother    COPD Mother    Hearing loss Mother     Varicose Veins Mother    Hypertension Father    Anxiety disorder Father    Arthritis Father    Hyperlipidemia Father    Kidney disease Father    Diabetes Maternal Grandfather    Cancer Sister    Miscarriages / Stillbirths Sister    Obesity Sister         Objective:   Physical Exam Vitals reviewed.  Constitutional:      General: She is not in acute distress.    Appearance: She is well-developed.  HENT:     Head: Normocephalic and atraumatic.     Right Ear: Tympanic membrane normal.     Left Ear: A middle ear effusion is present.  Eyes:     Pupils: Pupils are equal, round, and reactive to light.  Neck:     Thyroid : No thyromegaly.  Cardiovascular:     Rate and Rhythm: Normal rate and regular rhythm.     Heart sounds: Normal heart sounds. No murmur heard. Pulmonary:     Effort: Pulmonary effort is normal. No respiratory distress.     Breath sounds: Normal breath sounds. No wheezing.  Abdominal:     General: Bowel sounds are normal. There is no distension.     Palpations: Abdomen is soft.     Tenderness: There is no abdominal tenderness.  Musculoskeletal:        General: No tenderness. Normal range of motion.     Cervical back: Normal range of motion and neck supple.  Skin:    General: Skin is warm and dry.  Neurological:     Mental Status: She is alert and oriented to person, place, and time.     Cranial Nerves: No cranial nerve deficit.     Deep Tendon Reflexes: Reflexes are normal and symmetric.  Psychiatric:        Behavior: Behavior normal.        Thought Content: Thought content normal.        Judgment: Judgment normal.       BP 104/65   Pulse 87   Temp (!) 97.2 F (36.2 C)   Ht 5' 4 (1.626 m)   Wt 130 lb 6.4 oz (59.1 kg)   SpO2 97% Comment: supine  BMI 22.38 kg/m      Assessment & Plan:  MALANA EBERWEIN comes in today with chief complaint of Dysuria and Nasal Congestion (With little sob)   Diagnosis and orders addressed:  1. Urinary frequency  (Primary) - Urinalysis, Complete  2. Emphysema lung (HCC) - albuterol  (VENTOLIN  HFA) 108 (90 Base) MCG/ACT inhaler; Inhale 2 puffs into the lungs every 6 (six) hours as needed for wheezing or shortness of breath.  Dispense: 8 g; Refill: 0  3. Smoker  4. Viral  URI - Take meds as prescribed - Use a cool mist humidifier  -Use saline nose sprays frequently -Force fluids -For any cough or congestion  Use plain Mucinex- regular strength or max strength is fine -For fever or aces or pains- take tylenol  or ibuprofen. -Throat lozenges if help -Follow up if symptoms worsen or do not improve  - cetirizine (ZYRTEC ALLERGY) 10 MG tablet; Take 1 tablet (10 mg total) by mouth daily.  Dispense: 90 tablet; Refill: 1 - guaiFENesin (MUCINEX) 600 MG 12 hr tablet; Take 2 tablets (1,200 mg total) by mouth 2 (two) times daily.  Dispense: 60 tablet; Refill: 1  Start zyrtec  Albuterol  as needed Smoking cessation  Mucinex as needed Follow up if symptoms worsen or do not improve     Bari Learn, FNP

## 2024-07-05 NOTE — Telephone Encounter (Signed)
 FYI Only or Action Required?: FYI only for provider.  Patient was last seen in primary care on 06/07/2024 by Lavell Bari LABOR, FNP.  Called Nurse Triage reporting Shortness of Breath.  Symptoms began several weeks ago.  Interventions attempted: Nothing.  Symptoms are: gradually worsening.  Triage Disposition: See HCP Within 4 Hours (Or PCP Triage)  Patient/caregiver understands and will follow disposition?: Yes, will follow disposition  Copied from CRM [id number]. Topic: Clinical - Red Word Triage >> Jul 05, 2024  8:10 AM Ivette P wrote: [facility] that prompted transfer to Nurse Triage: short of breath, cough, Reason for Disposition  [1] Longstanding difficulty breathing (e.g., CHF, COPD, emphysema) AND [2] WORSE than normal  Answer Assessment - Initial Assessment Questions 1. RESPIRATORY STATUS: Describe your breathing? (e.g., wheezing, shortness of breath, unable to speak, severe coughing)      SOB 2. ONSET: When did this breathing problem begin?      Happening more when I'm laying down, for a couple weeks 3. PATTERN Does the difficult breathing come and go, or has it been constant since it started?      Intermittent, at night when flat 4. SEVERITY: How bad is your breathing? (e.g., mild, moderate, severe)      mild 5. RECURRENT SYMPTOM: Have you had difficulty breathing before? If Yes, ask: When was the last time? and What happened that time?      Has been told she has emphysema,  6. CARDIAC HISTORY: Do you have any history of heart disease? (e.g., heart attack, angina, bypass surgery, angioplasty)      denies 7. LUNG HISTORY: Do you have any history of lung disease?  (e.g., pulmonary embolus, asthma, emphysema)     emphysema 8. CAUSE: What do you think is causing the breathing problem?      unsure 9. OTHER SYMPTOMS: Do you have any other symptoms? (e.g., chest pain, cough, dizziness, fever, runny nose)     Cough,  10. O2 SATURATION MONITOR:  Do you use  an oxygen saturation monitor (pulse oximeter) at home? If Yes, ask: What is your reading (oxygen level) today? What is your usual oxygen saturation reading? (e.g., 95%)       Denies  Denies CP, denies dizziness. Requests to only see PCP.  Protocols used: Breathing Difficulty-A-AH

## 2024-07-06 ENCOUNTER — Ambulatory Visit: Payer: Self-pay | Admitting: Family

## 2024-07-06 LAB — URINALYSIS, COMPLETE
Bilirubin, UA: NEGATIVE
Glucose, UA: NEGATIVE
Ketones, UA: NEGATIVE
Leukocytes,UA: NEGATIVE
Nitrite, UA: NEGATIVE
Protein,UA: NEGATIVE
Specific Gravity, UA: 1.02 (ref 1.005–1.030)
Urobilinogen, Ur: 0.2 mg/dL (ref 0.2–1.0)
pH, UA: 6.5 (ref 5.0–7.5)

## 2024-07-06 LAB — MICROSCOPIC EXAMINATION: WBC, UA: NONE SEEN /HPF (ref 0–5)

## 2024-07-09 DIAGNOSIS — Z86000 Personal history of in-situ neoplasm of breast: Secondary | ICD-10-CM | POA: Diagnosis not present

## 2024-07-09 DIAGNOSIS — N6091 Unspecified benign mammary dysplasia of right breast: Secondary | ICD-10-CM | POA: Diagnosis not present

## 2024-07-09 DIAGNOSIS — N6021 Fibroadenosis of right breast: Secondary | ICD-10-CM | POA: Diagnosis not present

## 2024-07-09 DIAGNOSIS — R92331 Mammographic heterogeneous density, right breast: Secondary | ICD-10-CM | POA: Diagnosis not present

## 2024-07-09 DIAGNOSIS — N6313 Unspecified lump in the right breast, lower outer quadrant: Secondary | ICD-10-CM | POA: Diagnosis not present

## 2024-07-16 DIAGNOSIS — Z716 Tobacco abuse counseling: Secondary | ICD-10-CM | POA: Diagnosis not present

## 2024-07-16 DIAGNOSIS — N6091 Unspecified benign mammary dysplasia of right breast: Secondary | ICD-10-CM | POA: Diagnosis not present

## 2024-07-16 DIAGNOSIS — R9230 Dense breasts, unspecified: Secondary | ICD-10-CM | POA: Diagnosis not present

## 2024-07-16 DIAGNOSIS — Z79890 Hormone replacement therapy (postmenopausal): Secondary | ICD-10-CM | POA: Diagnosis not present

## 2024-07-16 DIAGNOSIS — Z86000 Personal history of in-situ neoplasm of breast: Secondary | ICD-10-CM | POA: Diagnosis not present

## 2024-07-16 DIAGNOSIS — N6001 Solitary cyst of right breast: Secondary | ICD-10-CM | POA: Diagnosis not present

## 2024-07-24 DIAGNOSIS — F330 Major depressive disorder, recurrent, mild: Secondary | ICD-10-CM | POA: Diagnosis not present

## 2024-07-25 DIAGNOSIS — Z86000 Personal history of in-situ neoplasm of breast: Secondary | ICD-10-CM | POA: Diagnosis not present

## 2024-07-25 DIAGNOSIS — Z716 Tobacco abuse counseling: Secondary | ICD-10-CM | POA: Diagnosis not present

## 2024-07-25 DIAGNOSIS — N6091 Unspecified benign mammary dysplasia of right breast: Secondary | ICD-10-CM | POA: Diagnosis not present

## 2024-07-31 ENCOUNTER — Other Ambulatory Visit: Payer: Self-pay | Admitting: Family

## 2024-07-31 DIAGNOSIS — E039 Hypothyroidism, unspecified: Secondary | ICD-10-CM

## 2024-08-07 ENCOUNTER — Ambulatory Visit: Payer: Self-pay

## 2024-08-07 ENCOUNTER — Encounter: Payer: Self-pay | Admitting: Family

## 2024-08-07 ENCOUNTER — Ambulatory Visit (INDEPENDENT_AMBULATORY_CARE_PROVIDER_SITE_OTHER): Admitting: Family

## 2024-08-07 VITALS — BP 102/69 | HR 77 | Temp 97.6°F | Ht 64.0 in | Wt 130.8 lb

## 2024-08-07 DIAGNOSIS — R059 Cough, unspecified: Secondary | ICD-10-CM

## 2024-08-07 DIAGNOSIS — R5383 Other fatigue: Secondary | ICD-10-CM

## 2024-08-07 DIAGNOSIS — R350 Frequency of micturition: Secondary | ICD-10-CM

## 2024-08-07 NOTE — Telephone Encounter (Signed)
 Appt made.

## 2024-08-07 NOTE — Patient Instructions (Signed)
 Fatigue If you have fatigue, you feel tired all the time and have a lack of energy or a lack of motivation. Fatigue may make it difficult to start or complete tasks because of exhaustion. Occasional or mild fatigue is often a normal response to activity or life. However, long-term (chronic) or extreme fatigue may be a symptom of a medical condition such as: Depression. Not having enough red blood cells or hemoglobin in the blood (anemia). A problem with a small gland located in the lower front part of the neck (thyroid disorder). Rheumatologic conditions. These are problems related to the body's defense system (immune system). Infections, especially certain viral infections. Fatigue can also lead to negative health outcomes over time. Follow these instructions at home: Medicines Take over-the-counter and prescription medicines only as told by your health care provider. Take a multivitamin if told by your health care provider. Do not use herbal or dietary supplements unless they are approved by your health care provider. Eating and drinking  Avoid heavy meals in the evening. Eat a well-balanced diet, which includes lean proteins, whole grains, plenty of fruits and vegetables, and low-fat dairy products. Avoid eating or drinking too many products with caffeine in them. Avoid alcohol. Drink enough fluid to keep your urine pale yellow. Activity  Exercise regularly, as told by your health care provider. Use or practice techniques to help you relax, such as yoga, tai chi, meditation, or massage therapy. Lifestyle Change situations that cause you stress. Try to keep your work and personal schedules in balance. Do not use recreational or illegal drugs. General instructions Monitor your fatigue for any changes. Go to bed and get up at the same time every day. Avoid fatigue by pacing yourself during the day and getting enough sleep at night. Maintain a healthy weight. Contact a health care  provider if: Your fatigue does not get better. You have a fever. You suddenly lose or gain weight. You have headaches. You have trouble falling asleep or sleeping through the night. You feel angry, guilty, anxious, or sad. You have swelling in your legs or another part of your body. Get help right away if: You feel confused, feel like you might faint, or faint. Your vision is blurry or you have a severe headache. You have severe pain in your abdomen, your back, or the area between your waist and hips (pelvis). You have chest pain, shortness of breath, or an irregular or fast heartbeat. You are unable to urinate, or you urinate less than normal. You have abnormal bleeding from the rectum, nose, lungs, nipples, or, if you are female, the vagina. You vomit blood. You have thoughts about hurting yourself or others. These symptoms may be an emergency. Get help right away. Call 911. Do not wait to see if the symptoms will go away. Do not drive yourself to the hospital. Get help right away if you feel like you may hurt yourself or others, or have thoughts about taking your own life. Go to your nearest emergency room or: Call 911. Call the National Suicide Prevention Lifeline at (262)721-8699 or 988. This is open 24 hours a day. Text the Crisis Text Line at 8450584327. Summary If you have fatigue, you feel tired all the time and have a lack of energy or a lack of motivation. Fatigue may make it difficult to start or complete tasks because of exhaustion. Long-term (chronic) or extreme fatigue may be a symptom of a medical condition. Exercise regularly, as told by your health care provider.  Change situations that cause you stress. Try to keep your work and personal schedules in balance. This information is not intended to replace advice given to you by your health care provider. Make sure you discuss any questions you have with your health care provider. Document Revised: 06/29/2021 Document  Reviewed: 06/29/2021 Elsevier Patient Education  2024 [employer].

## 2024-08-07 NOTE — Progress Notes (Signed)
 Subjective:    Patient ID: Ruth Hopkins, female    DOB: [birth year], 58 y.o.   MRN: [id number]  Chief Complaint  Patient presents with   Urinary Frequency    3-4 days ago    Nausea    And weak at night    Pt presents to the office today with urinary frequency, fatigue, and nausea that started 3-4 days.   States she has had increase cough. She is still smoking > 1/2 pack.  Urinary Frequency  This is a new problem. The current episode started in the past 7 days. The problem occurs every urination. The problem has been waxing and waning. The pain is at a severity of 0/10. The patient is experiencing no pain. There has been no fever. Associated symptoms include frequency, nausea and urgency. Pertinent negatives include no flank pain, hematuria or hesitancy. She has tried increased fluids for the symptoms. The treatment provided mild relief.      Review of Systems  Gastrointestinal:  Positive for nausea.  Genitourinary:  Positive for frequency and urgency. Negative for flank pain, hematuria and hesitancy.  All other systems reviewed and are negative.   Social History   Socioeconomic History   Marital status: Married    Spouse name: Not on file   Number of children: 0   Years of education: Not on file   Highest education level: Associate degree: occupational, scientist, product/process development, or vocational program  Occupational History   Occupation: disability  Tobacco Use   Smoking status: Every Day    Current packs/day: 1.00    Average packs/day: 1 pack/day for 46.8 years (46.8 ttl pk-yrs)    Types: Cigarettes    Start date: 11/02/1977   Smokeless tobacco: Never   Tobacco comments:    Smoking since age 58 - 1ppd usually - has tried to quit several times - is afraid it will make her gain weight    Started smoking again a few weeks ago, maybe 15 ciggs per day --12/16/2022  Vaping Use   Vaping status: Never Used  Substance and Sexual Activity   Alcohol use: No   Drug use: No   Sexual activity: Yes     Birth control/protection: None  Other Topics Concern   Not on file  Social History Narrative   Lives home with husband in one level home   Her mother and sister live nearby   [place] faith   Social Drivers of Health   Financial Resource Strain: Low Risk  (07/05/2024)   Overall Financial Resource Strain (CARDIA)    Difficulty of Paying Living Expenses: Not hard at all  Food Insecurity: No Food Insecurity (07/05/2024)   Hunger Vital Sign    Worried About Running Out of Food in the Last Year: Never true    Ran Out of Food in the Last Year: Never true  Transportation Needs: No Transportation Needs (07/05/2024)   PRAPARE - Administrator, Civil Service (Medical): No    Lack of Transportation (Non-Medical): No  Physical Activity: Insufficiently Active (07/05/2024)   Exercise Vital Sign    Days of Exercise per Week: 2 days    Minutes of Exercise per Session: 30 min  Stress: No Stress Concern Present (07/05/2024)   [employer] of Occupational Health - Occupational Stress Questionnaire    Feeling of Stress: Only a little  Social Connections: Moderately Integrated (07/05/2024)   Social Connection and Isolation Panel    Frequency of Communication with Friends and Family: More than  three times a week    Frequency of Social Gatherings with Friends and Family: More than three times a week    Attends Religious Services: More than 4 times per year    Active Member of [employer] or Organizations: No    Attends Engineer, Structural: Not on file    Marital Status: Married   Family History  Problem Relation Age of Onset   Asthma Mother    Hypothyroidism Mother    Hypertension Mother    Raynaud syndrome Mother    COPD Mother    Hearing loss Mother    Varicose Veins Mother    Hypertension Father    Anxiety disorder Father    Arthritis Father    Hyperlipidemia Father    Kidney disease Father    Diabetes Maternal Grandfather    Cancer Sister     Miscarriages / Stillbirths Sister    Obesity Sister         Objective:   Physical Exam Vitals reviewed.  Constitutional:      General: She is not in acute distress.    Appearance: She is well-developed.  HENT:     Head: Normocephalic and atraumatic.     Right Ear: Tympanic membrane normal.     Left Ear: Tympanic membrane normal.  Eyes:     Pupils: Pupils are equal, round, and reactive to light.  Neck:     Thyroid : No thyromegaly.  Cardiovascular:     Rate and Rhythm: Normal rate and regular rhythm.     Heart sounds: Normal heart sounds. No murmur heard. Pulmonary:     Effort: Pulmonary effort is normal. No respiratory distress.     Breath sounds: Normal breath sounds. No wheezing.  Abdominal:     General: Bowel sounds are normal. There is no distension.     Palpations: Abdomen is soft.     Tenderness: There is no abdominal tenderness.  Musculoskeletal:        General: No tenderness. Normal range of motion.     Cervical back: Normal range of motion and neck supple.  Skin:    General: Skin is warm and dry.  Neurological:     Mental Status: She is alert and oriented to person, place, and time.     Cranial Nerves: No cranial nerve deficit.     Deep Tendon Reflexes: Reflexes are normal and symmetric.  Psychiatric:        Behavior: Behavior normal.        Thought Content: Thought content normal.        Judgment: Judgment normal.       BP 102/69   Pulse 77   Temp 97.6 F (36.4 C) (Temporal)   Ht 5' 4 (1.626 m)   Wt 130 lb 12.8 oz (59.3 kg)   SpO2 97%   BMI 22.45 kg/m      Assessment & Plan:  Ruth Hopkins comes in today with chief complaint of Urinary Frequency (3-4 days ago ) and Nausea (And weak at night )   Diagnosis and orders addressed:  1. Urinary frequency (Primary) - Urinalysis, Complete - Urine Culture  2. Cough, unspecified type - Veritor SARS-CoV-2 and Flu A+B  3. Other fatigue - CMP14+EGFR - Anemia Profile B - TSH   Labs  pending Continue current medications  COVID and Flu pending  Urine negative  Follow up if symptoms worsen or do not improve    Bari Learn, FNP

## 2024-08-07 NOTE — Telephone Encounter (Signed)
 FYI Only or Action Required?: FYI only for provider: appointment scheduled on 08/07/2024.  Patient was last seen in primary care on 07/05/2024 by Lavell Bari LABOR, FNP.  Called Nurse Triage reporting Urinary Frequency.  Symptoms began several days ago.  Interventions attempted: OTC medications: Tylenol .  Symptoms are: gradually worsening.  Triage Disposition: See Physician Within 24 Hours  Patient/caregiver understands and will follow disposition?: Yes        Copied from CRM [id number]. Topic: Clinical - Red Word Triage >> Aug 07, 2024  1:00 PM Emylou G wrote: [facility] that prompted transfer to Nurse Triage: uti?  running to bathroom, nauseated, weakness Reason for Disposition  Urinating more frequently than usual (i.e., frequency) OR new-onset of the feeling of an urgent need to urinate (i.e., urgency)  Answer Assessment - Initial Assessment Questions 1. SYMPTOM: What's the main symptom you're concerned about? (e.g., frequency, incontinence)     Frequency  2. ONSET: When did the  frequency  start?     A few days ago  3. PAIN: Is there any pain? If Yes, ask: How bad is it? (Scale: 1-10; mild, moderate, severe)     Denies  4. CAUSE: What do you think is causing the symptoms?     UTI  5. OTHER SYMPTOMS: Do you have any other symptoms? (e.g., blood in urine, fever, flank pain, pain with urination)     Nausea and weakness    Taking Tylenol  for symptoms.  Protocols used: Urinary Symptoms-A-AH

## 2024-08-08 LAB — CMP14+EGFR
ALT: 13 IU/L (ref 0–32)
AST: 20 IU/L (ref 0–40)
Albumin: 4.3 g/dL (ref 3.8–4.9)
Alkaline Phosphatase: 57 IU/L (ref 49–135)
BUN/Creatinine Ratio: 38 — ABNORMAL HIGH (ref 9–23)
BUN: 27 mg/dL — ABNORMAL HIGH (ref 6–24)
Bilirubin Total: 0.2 mg/dL (ref 0.0–1.2)
CO2: 24 mmol/L (ref 20–29)
Calcium: 9.5 mg/dL (ref 8.7–10.2)
Chloride: 103 mmol/L (ref 96–106)
Creatinine, Ser: 0.71 mg/dL (ref 0.57–1.00)
Globulin, Total: 2.4 g/dL (ref 1.5–4.5)
Glucose: 85 mg/dL (ref 70–99)
Potassium: 4.1 mmol/L (ref 3.5–5.2)
Sodium: 141 mmol/L (ref 134–144)
Total Protein: 6.7 g/dL (ref 6.0–8.5)
eGFR: 98 mL/min/1.73 (ref >59)

## 2024-08-08 LAB — ANEMIA PROFILE B
Basophils Absolute: 0.1 x10E3/uL (ref 0.0–0.2)
Basos: 1 %
EOS (ABSOLUTE): 0.3 x10E3/uL (ref 0.0–0.4)
Eos: 6 %
Ferritin: 84 ng/mL (ref 15–150)
Folate: 18.6 ng/mL (ref >3.0)
Hematocrit: 44.2 % (ref 34.0–46.6)
Hemoglobin: 14 g/dL (ref 11.1–15.9)
Immature Grans (Abs): 0 x10E3/uL (ref 0.0–0.1)
Immature Granulocytes: 0 %
Iron Saturation: 45 % (ref 15–55)
Iron: 115 ug/dL (ref 27–159)
Lymphocytes Absolute: 2.1 x10E3/uL (ref 0.7–3.1)
Lymphs: 39 %
MCH: 29.2 pg (ref 26.6–33.0)
MCHC: 31.7 g/dL (ref 31.5–35.7)
MCV: 92 fL (ref 79–97)
Monocytes Absolute: 0.3 x10E3/uL (ref 0.1–0.9)
Monocytes: 7 %
Neutrophils Absolute: 2.5 x10E3/uL (ref 1.4–7.0)
Neutrophils: 47 %
Platelets: 198 x10E3/uL (ref 150–450)
RBC: 4.79 x10E6/uL (ref 3.77–5.28)
RDW: 11.6 % — ABNORMAL LOW (ref 11.7–15.4)
Retic Ct Pct: 0.9 % (ref 0.6–2.6)
Total Iron Binding Capacity: 258 ug/dL (ref 250–450)
UIBC: 143 ug/dL (ref 131–425)
Vitamin B-12: 931 pg/mL (ref 232–1245)
WBC: 5.3 x10E3/uL (ref 3.4–10.8)

## 2024-08-08 LAB — URINALYSIS, COMPLETE
Bilirubin, UA: NEGATIVE
Glucose, UA: NEGATIVE
Ketones, UA: NEGATIVE
Leukocytes,UA: NEGATIVE
Nitrite, UA: NEGATIVE
Protein,UA: NEGATIVE
Specific Gravity, UA: 1.02 (ref 1.005–1.030)
Urobilinogen, Ur: 0.2 mg/dL (ref 0.2–1.0)
pH, UA: 5.5 (ref 5.0–7.5)

## 2024-08-08 LAB — TSH: TSH: 1.22 u[IU]/mL (ref 0.450–4.500)

## 2024-08-08 LAB — MICROSCOPIC EXAMINATION
Bacteria, UA: NONE SEEN
Renal Epithel, UA: NONE SEEN /HPF
Yeast, UA: NONE SEEN

## 2024-08-08 LAB — VERITOR SARS-COV-2 AND FLU A+B
BD Veritor SARS-CoV-2 Ag: NEGATIVE
Influenza A: NEGATIVE
Influenza B: NEGATIVE

## 2024-08-09 ENCOUNTER — Ambulatory Visit: Payer: Self-pay | Admitting: Family

## 2024-08-09 LAB — URINE CULTURE

## 2024-08-09 NOTE — Progress Notes (Signed)
 PT R/C

## 2024-08-09 NOTE — Telephone Encounter (Signed)
CALLED PATIENT, NO ANSWER, LEFT MESSAGE TO RETURN CALL 

## 2024-08-21 DIAGNOSIS — R140 Abdominal distension (gaseous): Secondary | ICD-10-CM | POA: Diagnosis not present

## 2024-08-28 ENCOUNTER — Ambulatory Visit (HOSPITAL_BASED_OUTPATIENT_CLINIC_OR_DEPARTMENT_OTHER)
Admission: RE | Admit: 2024-08-28 | Discharge: 2024-08-28 | Disposition: A | Source: Ambulatory Visit | Attending: Acute Care | Admitting: Acute Care

## 2024-08-28 DIAGNOSIS — Z122 Encounter for screening for malignant neoplasm of respiratory organs: Secondary | ICD-10-CM

## 2024-08-28 DIAGNOSIS — F17210 Nicotine dependence, cigarettes, uncomplicated: Secondary | ICD-10-CM

## 2024-08-28 DIAGNOSIS — Z87891 Personal history of nicotine dependence: Secondary | ICD-10-CM | POA: Diagnosis not present

## 2024-09-03 ENCOUNTER — Ambulatory Visit: Payer: Self-pay

## 2024-09-03 ENCOUNTER — Encounter: Payer: Self-pay | Admitting: Family Medicine

## 2024-09-03 ENCOUNTER — Ambulatory Visit: Admitting: Family Medicine

## 2024-09-03 VITALS — BP 110/73 | HR 78 | Temp 97.5°F | Ht 64.0 in | Wt 132.0 lb

## 2024-09-03 DIAGNOSIS — R399 Unspecified symptoms and signs involving the genitourinary system: Secondary | ICD-10-CM

## 2024-09-03 LAB — URINALYSIS, COMPLETE
Bilirubin, UA: NEGATIVE
Glucose, UA: NEGATIVE
Ketones, UA: NEGATIVE
Leukocytes,UA: NEGATIVE
Nitrite, UA: NEGATIVE
Protein,UA: NEGATIVE
Specific Gravity, UA: 1.015 (ref 1.005–1.030)
Urobilinogen, Ur: 0.2 mg/dL (ref 0.2–1.0)
pH, UA: 6.5 (ref 5.0–7.5)

## 2024-09-03 LAB — MICROSCOPIC EXAMINATION
Bacteria, UA: NONE SEEN
Renal Epithel, UA: NONE SEEN /HPF
WBC, UA: NONE SEEN /HPF (ref 0–5)
Yeast, UA: NONE SEEN

## 2024-09-03 NOTE — Telephone Encounter (Signed)
 Answer Assessment - Initial Assessment Questions Patient states that for the past 3 days she has been experiencing urinary frequency, burning with urination, urinary urgency, and generalized malaise. She denies any fevers or current back pain She also denies any cloudy or dark urine that she has noticed She has taken Tylenol  for the discomfort & believes this feels like a UTI Patient wanted to be seen today if possible and didn't mind if it was a different provider than her PCP  Patient is advised to call us  back if anything changes or with any further questions/concerns. Patient is advised that if anything worsens to go to the Emergency Room. Patient verbalized understanding.  Protocols used: Urinary Symptoms-A-AH

## 2024-09-03 NOTE — Telephone Encounter (Signed)
Noted  -LS

## 2024-09-03 NOTE — Telephone Encounter (Signed)
 Patient returning call, advised DOD for today at pt request. Denies further questions or concerns.

## 2024-09-03 NOTE — Telephone Encounter (Addendum)
 Patient wanted to be seen today if possible-appt made for today  Attempted to call patient back just to update her on who the Doctor of the Day was at her PCP office but no answer--left a voicemail for patient to call back just for this update if she calls back   FYI Only or Action Required?: FYI only for provider: appointment scheduled on 09/03/2024 at 10:50am with Doctor of the Day at PCP office.  Patient was last seen in primary care on 08/07/2024 by Lavell Bari LABOR, FNP.  Called Nurse Triage reporting Urinary Frequency.  Symptoms began 3 days ago.  Interventions attempted: OTC medications: Tylenol , Rest, hydration, or home remedies, and Other: water.  Symptoms are: gradually worsening.  Triage Disposition: See Physician Within 24 Hours  Patient/caregiver understands and will follow disposition?: Yes  Patient states that for the past 3 days she has been experiencing urinary frequency, burning with urination, urinary urgency, and generalized malaise. She denies any fevers or current back pain She also denies any cloudy or dark urine that she has noticed She has taken Tylenol  for the discomfort & believes this feels like a UTI Patient wanted to be seen today if possible and didn't mind if it was a different provider than her PCP  Patient is advised to call us  back if anything changes or with any further questions/concerns. Patient is advised that if anything worsens to go to the Emergency Room. Patient verbalized understanding.                Copied from CRM #[id number]. Topic: Clinical - Red Word Triage >> Sep 03, 2024  7:47 AM Ivette P wrote: Red Word that prompted transfer to Nurse Triage: UTI - extreme frequency. Burning with urniation. Nausea. feeling tired. Reason for Disposition  Urinating more frequently than usual (i.e., frequency) OR new-onset of the feeling of an urgent need to urinate (i.e., urgency)  Protocols used: Urinary Symptoms-A-AH

## 2024-09-03 NOTE — Progress Notes (Signed)
 BP 110/73   Pulse 78   Temp (!) 97.5 F (36.4 C)   Ht 5' 4 (1.626 m)   Wt 132 lb (59.9 kg)   SpO2 99%   BMI 22.66 kg/m    Subjective:   Patient ID: Ruth Hopkins, female    DOB: Dec 28, 1965, 58 y.o.   MRN: [id number]  HPI: Ruth Hopkins is a 58 y.o. female presenting on 09/03/2024 for Urinary Tract Infection   Discussed the use of AI scribe software for clinical note transcription with the patient, who gave verbal consent to proceed.  History of Present Illness   Ruth Hopkins is a 58 year old female who presents with urinary symptoms including increased frequency and burning sensation.  Urinary symptoms - Onset of increased urinary frequency since Saturday morning, worsening by Saturday evening - Burning sensation during urination - No hematuria - No fever or chills - No current kidney pain - No vaginal symptoms  Flank pain - Bilateral flank pain occurred Saturday night - Flank pain has since improved  Gastrointestinal symptoms - Mild nausea and fatigue, partially attributed to lack of sleep - No bowel symptoms - Stomach sensitivity present, but unchanged from baseline  Dietary factors - Consumed large amount of tea on Saturday, which she believes exacerbated urinary symptoms - No caffeine intake yesterday, which seemed to improve symptoms slightly          Relevant past medical, surgical, family and social history reviewed and updated as indicated. Interim medical history since our last visit reviewed. Allergies and medications reviewed and updated.  Review of Systems  Constitutional:  Negative for chills and fever.  Eyes:  Negative for visual disturbance.  Respiratory:  Negative for chest tightness and shortness of breath.   Cardiovascular:  Negative for chest pain and leg swelling.  Gastrointestinal:  Positive for nausea. Negative for abdominal pain, constipation and diarrhea.  Genitourinary:  Positive for dysuria, frequency and urgency. Negative for  difficulty urinating, flank pain, hematuria, vaginal bleeding, vaginal discharge and vaginal pain.  Musculoskeletal:  Negative for back pain and gait problem.  Skin:  Negative for rash.  Neurological:  Negative for light-headedness and headaches.  Psychiatric/Behavioral:  Negative for agitation and behavioral problems.   All other systems reviewed and are negative.   Per HPI unless specifically indicated above   Allergies as of 09/03/2024       Reactions   Latex Itching, Rash   Codeine    Gabapentin     fuzzy   Sulfur Itching   Sulfa Antibiotics Hives, Itching, Rash        Medication List        Accurate as of September 03, 2024 11:15 AM. If you have any questions, ask your nurse or doctor.          acetaminophen  500 MG tablet Commonly known as: TYLENOL  Take by mouth.   albuterol  108 (90 Base) MCG/ACT inhaler Commonly known as: VENTOLIN  HFA Inhale 2 puffs into the lungs every 6 (six) hours as needed for wheezing or shortness of breath.   buPROPion  150 MG 24 hr tablet Commonly known as: WELLBUTRIN  XL Take 150 mg by mouth.   cephALEXin  500 MG capsule Commonly known as: KEFLEX  Take 1 capsule (500 mg total) by mouth 4 (four) times daily.   cetirizine  10 MG tablet Commonly known as: ZyrTEC  Allergy Take 1 tablet (10 mg total) by mouth daily.   clonazePAM  0.5 MG tablet Commonly known as: KLONOPIN  Take 1 tablet (0.5 mg total)  by mouth 2 (two) times daily.   cycloSPORINE 0.05 % ophthalmic emulsion Commonly known as: RESTASIS Restasis 0.05 % eye drops in a dropperette  USE 1 DROP INTO BOTH EYES TWICE A DAY   diclofenac  75 MG EC tablet Commonly known as: VOLTAREN  Take 1 tablet (75 mg total) by mouth 2 (two) times daily.   eletriptan  40 MG tablet Commonly known as: RELPAX  TAKE 1 TABLET BY MOUTH EVERY 2 HOURS AS NEEDED FOR MIGRAINE HEADACHE . DO NOT EXCEED 6 PER 24 HOURS   estradiol  0.075 MG/24HR Commonly known as: VIVELLE -DOT Place 1 patch onto the skin 2  (two) times a week.   finasteride 5 MG tablet Commonly known as: PROSCAR Take by mouth.   Fluocinolone  Acetonide 0.01 % Oil place 1 or 2 drops in right ear daily AS DIRECTED.   fluticasone  50 MCG/ACT nasal spray Commonly known as: FLONASE  Place 2 sprays into both nostrils daily.   guaiFENesin  600 MG 12 hr tablet Commonly known as: Mucinex  Take 2 tablets (1,200 mg total) by mouth 2 (two) times daily.   imipramine  10 MG tablet Commonly known as: TOFRANIL  Take 3 tablets (30 mg total) by mouth at bedtime.   levothyroxine  50 MCG tablet Commonly known as: SYNTHROID  TAKE 1 TABLET BY MOUTH ONCE DAILY BEFORE BREAKFAST   methocarbamol  500 MG tablet Commonly known as: ROBAXIN  Take 1 tablet (500 mg total) by mouth 4 (four) times daily.   mometasone  50 MCG/ACT nasal spray Commonly known as: NASONEX  PLACE 2 SPRAYS INTO THE NOSE DAILY.   MULTIVITAMIN PO Take by mouth.   omeprazole  40 MG capsule Commonly known as: PRILOSEC Take 1 capsule (40 mg total) by mouth daily.   ondansetron  4 MG disintegrating tablet Commonly known as: ZOFRAN -ODT Take 1 tablet (4 mg total) by mouth every 8 (eight) hours as needed for nausea or vomiting.   tacrolimus  0.1 % ointment Commonly known as: PROTOPIC  tacrolimus  0.1 % topical ointment  APPLY OINTMENT TOPICALLY TO AFFECTED AREA TWICE DAILY   valACYclovir  500 MG tablet Commonly known as: VALTREX  Take 500 mg by mouth daily as needed.         Objective:   BP 110/73   Pulse 78   Temp (!) 97.5 F (36.4 C)   Ht 5' 4 (1.626 m)   Wt 132 lb (59.9 kg)   SpO2 99%   BMI 22.66 kg/m   Wt Readings from Last 3 Encounters:  09/03/24 132 lb (59.9 kg)  08/07/24 130 lb 12.8 oz (59.3 kg)  07/05/24 130 lb 6.4 oz (59.1 kg)    Physical Exam Physical Exam   CHEST: Lungs clear to auscultation bilaterally. CARDIOVASCULAR: Heart sounds regular.         Assessment & Plan:   Problem List Items Addressed This Visit   None Visit Diagnoses        UTI symptoms    -  Primary   Relevant Orders   Urinalysis, Complete   Urine Culture          Urinary tract infection Increased urinary frequency and urgency with fatigue and nausea. Bilateral flank pain improved. No hematuria. Caffeine worsens symptoms. Urinalysis pending.      Urinalysis shows 2+ blood and 0-10 epithelial cells but otherwise negative will await culture and recommend hydration    Follow up plan: Return if symptoms worsen or fail to improve.  Counseling provided for all of the vaccine components Orders Placed This Encounter  Procedures   Urine Culture   Urinalysis, Complete  Fonda Levins, MD [facility] 09/03/2024, 11:15 AM

## 2024-09-04 ENCOUNTER — Other Ambulatory Visit: Payer: Self-pay

## 2024-09-04 DIAGNOSIS — Z122 Encounter for screening for malignant neoplasm of respiratory organs: Secondary | ICD-10-CM

## 2024-09-04 DIAGNOSIS — F17210 Nicotine dependence, cigarettes, uncomplicated: Secondary | ICD-10-CM

## 2024-09-04 DIAGNOSIS — Z87891 Personal history of nicotine dependence: Secondary | ICD-10-CM

## 2024-09-05 ENCOUNTER — Ambulatory Visit: Payer: Self-pay | Admitting: Family Medicine

## 2024-09-05 LAB — URINE CULTURE

## 2024-09-05 MED ORDER — AMOXICILLIN 500 MG PO CAPS: 500.0000 mg | ORAL_CAPSULE | Freq: Two times a day (BID) | ORAL | 0 refills | Status: DC

## 2024-09-06 ENCOUNTER — Telehealth: Payer: Self-pay

## 2024-09-06 NOTE — Telephone Encounter (Signed)
 Called and spoke with patient she is aware it was the right one sent in based off her culture. Patient verbalized understanding!!

## 2024-09-06 NOTE — Telephone Encounter (Signed)
 Copied from CRM [id number]. Topic: Clinical - Prescription Issue >> Sep 06, 2024 12:33 PM Donna BRAVO wrote: Reason for CRM: medication was called into pharmacy to UTI amoxicillin  (AMOXIL ) 500 MG capsule Pharmacist stated they don't usually have this medication for UTI   Patient is asking if she need to have Augmentin  prescribed  Patient requesting to speak with nurse

## 2024-09-07 ENCOUNTER — Other Ambulatory Visit: Payer: Self-pay | Admitting: Family Medicine

## 2024-09-07 DIAGNOSIS — R3989 Other symptoms and signs involving the genitourinary system: Secondary | ICD-10-CM

## 2024-09-10 ENCOUNTER — Other Ambulatory Visit: Payer: Self-pay | Admitting: *Deleted

## 2024-09-10 DIAGNOSIS — J069 Acute upper respiratory infection, unspecified: Secondary | ICD-10-CM

## 2024-09-10 MED ORDER — FLUTICASONE PROPIONATE 50 MCG/ACT NA SUSP: 2.0000 | Freq: Every day | NASAL | 1 refills | Status: AC

## 2024-09-24 ENCOUNTER — Ambulatory Visit: Payer: Self-pay | Admitting: *Deleted

## 2024-09-24 NOTE — Telephone Encounter (Signed)
 Pt scheduled for 1/7 at 11:50.

## 2024-09-24 NOTE — Telephone Encounter (Signed)
 FYI Only or Action Required?: Action required by provider: request for appointment.  Patient was last seen in primary care on 09/03/2024 by Dettinger, Fonda LABOR, MD.  Called Nurse Triage reporting Fatigue.  Symptoms began today.  Interventions attempted: Rest, hydration, or home remedies.  Symptoms are: unchanged.  Triage Disposition: See HCP Within 4 Hours (Or PCP Triage)  Patient/caregiver understands and will follow disposition?: No, refuses disposition    Patient states she woke with extreme fatigue today and not sure why. Patient is unable to check her BP which she states has been low before. Patient advised no open appointment- patient declines to go to [facility]- they don't do anything for you there. Patient advised I would send request- but it is late in the day for same day appointment- patient states she can not come tomorrow due to breast procedure.    Copied from CRM [id number]. Topic: Clinical - Red Word Triage >> Sep 24, 2024  1:55 PM Ivette P wrote: Red Word that prompted transfer to Nurse Triage: is feeling weak, thinks BP might be low and treated for UTI. find out if come in to see if still have UTI. or might jus tbe BP being to low. have an appt tomorrow to get clip in breast. and thrusday has surgery. feeling tired. Reason for Disposition  [1] MODERATE weakness (e.g., interferes with work, school, normal activities) AND [2] cause unknown  (Exceptions: Weakness from acute minor illness or poor fluid intake; weakness is chronic and not worse.)  Answer Assessment - Initial Assessment Questions 1. DESCRIPTION: Describe how you are feeling.     Sick feeling, nausea 2. SEVERITY: How bad is it?  Can you stand and walk?     Real tired, patient is able to stand and walk 3. ONSET: When did these symptoms begin? (e.g., hours, days, weeks, months)     This morning 4. CAUSE: What do you think is causing the weakness or fatigue? (e.g., not drinking enough fluids, medical  problem, trouble sleeping)     Not sure- low BP in past  5. NEW MEDICINES:  Have you started on any new medicines recently? (e.g., opioid pain medicines, benzodiazepines, muscle relaxants, antidepressants, antihistamines, neuroleptics, beta blockers)     no 6. OTHER SYMPTOMS: Do you have any other symptoms? (e.g., chest pain, fever, cough, SOB, vomiting, diarrhea, bleeding, other areas of pain)     No- one episode of loose stools yesterday  Protocols used: Weakness (Generalized) and Fatigue-A-AH

## 2024-09-26 ENCOUNTER — Ambulatory Visit

## 2024-10-04 ENCOUNTER — Ambulatory Visit: Payer: Self-pay

## 2024-10-04 NOTE — Telephone Encounter (Signed)
 Noted

## 2024-10-04 NOTE — Telephone Encounter (Signed)
 FYI Only or Action Required?: FYI only for provider: appointment scheduled on 1/16, pt seeing surgeon today.  Patient was last seen in primary care on 09/03/2024 by Dettinger, Fonda LABOR, MD.  Called Nurse Triage reporting Post-op Problem.  Symptoms began a week ago.  Interventions attempted: Rest, hydration, or home remedies.  Symptoms are: gradually worsening.  Triage Disposition: See Physician Within 4 Hours (or PCP Triage) (overriding See PCP When Office is Open (Within 3 Days))  Patient/caregiver understands and will follow disposition?: Yes     Copied from CRM #[id number]. Topic: Clinical - Red Word Triage >> Oct 04, 2024  9:19 AM Donna BRAVO wrote: Red Word that prompted transfer to Nurse Triage:  Surgery 09/27/24 lumpectomy right breast Appt with surgeon 10/04/24 at 11:30am  Frequent urination Weakness getting wore Reason for Disposition  Other post-op symptom or question  [1] MILD weakness (e.g., does not interfere with ability to work, go to school, normal activities) AND [2] persists > 1 week  Answer Assessment - Initial Assessment Questions Pt requesting appt tomorrow for urinary frequency following surgery on 1/8. Pt has appt with surgeon today at 11:30 am. Advised pt be examined for symptoms in next few hours, go to surgeon appt and tell about all symptoms, pt pretty sure won't test urine, scheduled PCP appt for tomorrow for urinary frequency which pt agrees to cancel if this is addressed at surgeon appt. Advised pt call back if new or worsening symptoms.     1. SYMPTOM: What's the main symptom you're concerned about? (e.g., pain, fever, vomiting)     Feel real weak the last 2 days Ever since had surgery been going more frequently, maybe have caught UTI, kept going to bathroom, not urinating that much each time Not too weak to stand, just feel tired, don't feel good today and yesterday but had been feeling better, but not been able to rest like should  3. SURGERY:  What surgery did you have?     Lumpectomy right breast  4. DATE of SURGERY: When was the surgery?      09/27/24  6. DRAINS: Were any drains place in or around the wound? (e.g., Hemovac, Jackson-Pratt, Penrose)     No drains  8. FEVER: Do you have a fever? If Yes, ask: What is your temperature, how was it measured, and when did it start?     Denies  9. VOMITING: Is there any vomiting? If Yes, ask: How many times?     Denies  10. BLEEDING: Is there any bleeding? If Yes, ask: How much? and Where?       Denies  Denies: Chest pain SOB Confusion, slurred speech, excessive sleepiness Bleeding Pain with urination Blood in urine Flank pain Having urinary catheter with surgery Pallor Abnormal heart beat Black/tarry bowel movements Vomiting Rash Headache  Protocols used: Post-Op Symptoms and Questions-A-AH, Weakness (Generalized) and Fatigue-A-AH

## 2024-10-05 ENCOUNTER — Encounter: Payer: Self-pay | Admitting: Family Medicine

## 2024-10-05 ENCOUNTER — Ambulatory Visit

## 2024-10-05 VITALS — BP 117/77 | HR 78 | Temp 98.0°F | Ht 64.0 in | Wt 132.0 lb

## 2024-10-05 DIAGNOSIS — R5383 Other fatigue: Secondary | ICD-10-CM

## 2024-10-05 DIAGNOSIS — R350 Frequency of micturition: Secondary | ICD-10-CM

## 2024-10-05 LAB — URINALYSIS, COMPLETE
Bilirubin, UA: NEGATIVE
Glucose, UA: NEGATIVE
Ketones, UA: NEGATIVE
Leukocytes,UA: NEGATIVE
Nitrite, UA: NEGATIVE
Protein,UA: NEGATIVE
Specific Gravity, UA: 1.02 (ref 1.005–1.030)
Urobilinogen, Ur: 0.2 mg/dL (ref 0.2–1.0)
pH, UA: 6.5 (ref 5.0–7.5)

## 2024-10-05 LAB — CBC WITH DIFFERENTIAL/PLATELET
Basophils Absolute: 0.1 x10E3/uL (ref 0.0–0.2)
Basos: 1 %
EOS (ABSOLUTE): 0.4 x10E3/uL (ref 0.0–0.4)
Eos: 8 %
Hematocrit: 41.4 % (ref 34.0–46.6)
Hemoglobin: 13.2 g/dL (ref 11.1–15.9)
Immature Grans (Abs): 0 x10E3/uL (ref 0.0–0.1)
Immature Granulocytes: 0 %
Lymphocytes Absolute: 2.1 x10E3/uL (ref 0.7–3.1)
Lymphs: 40 %
MCH: 29.5 pg (ref 26.6–33.0)
MCHC: 31.9 g/dL (ref 31.5–35.7)
MCV: 92 fL (ref 79–97)
Monocytes Absolute: 0.3 x10E3/uL (ref 0.1–0.9)
Monocytes: 7 %
Neutrophils Absolute: 2.3 x10E3/uL (ref 1.4–7.0)
Neutrophils: 44 %
Platelets: 218 x10E3/uL (ref 150–450)
RBC: 4.48 x10E6/uL (ref 3.77–5.28)
RDW: 11.7 % (ref 11.7–15.4)
WBC: 5.2 x10E3/uL (ref 3.4–10.8)

## 2024-10-05 LAB — MICROSCOPIC EXAMINATION

## 2024-10-05 NOTE — Progress Notes (Signed)
 "  BP 117/77   Pulse 78   Temp 98 F (36.7 C)   Ht 5' 4 (1.626 m)   Wt 132 lb (59.9 kg)   SpO2 93%   BMI 22.66 kg/m    Subjective:   Patient ID: Ruth Hopkins, female    DOB: October 28, 1965, 59 y.o.   MRN: [id number]  HPI: Ruth Hopkins is a 59 y.o. female presenting on 10/05/2024 for Urinary Tract Infection   Discussed the use of AI scribe software for clinical note transcription with the patient, who gave verbal consent to proceed.  History of Present Illness   Ruth Hopkins is a 59 year old female who presents with increased urinary frequency following recent breast surgery.  Urinary frequency and urgency - Increased urinary frequency for several days, characterized by the need to urinate frequently and immediately after voiding - Symptom improved yesterday - No vaginal discharge, irritation, bleeding, or kidney pain - Initial difficulty urinating following recent breast surgery - Suspects intraoperative catheterization may have contributed to urinary symptoms  Postoperative fatigue - Significant fatigue since recent surgery - Associates fatigue with recent surgery and anesthesia - Inadequate rest since the procedure  Abdominal pain - Chronic stomach pain attributed to history of gastrointestinal issues  Recent surgical history - Underwent excisional biopsy (lumpectomy) of the right breast last Thursday          Relevant past medical, surgical, family and social history reviewed and updated as indicated. Interim medical history since our last visit reviewed. Allergies and medications reviewed and updated.  Review of Systems  Constitutional:  Negative for chills and fever.  Eyes:  Negative for visual disturbance.  Respiratory:  Negative for chest tightness and shortness of breath.   Cardiovascular:  Negative for chest pain and leg swelling.  Genitourinary:  Positive for dysuria, frequency and urgency. Negative for difficulty urinating, hematuria, vaginal bleeding,  vaginal discharge and vaginal pain.  Musculoskeletal:  Negative for back pain and gait problem.  Skin:  Negative for rash.  Neurological:  Negative for light-headedness and headaches.  Psychiatric/Behavioral:  Negative for agitation and behavioral problems.   All other systems reviewed and are negative.   Per HPI unless specifically indicated above   Allergies as of 10/05/2024       Reactions   Latex Itching, Rash   Codeine    Gabapentin     fuzzy   Sulfur Itching   Sulfa Antibiotics Hives, Itching, Rash        Medication List        Accurate as of October 05, 2024  2:22 PM. If you have any questions, ask your nurse or doctor.          acetaminophen  500 MG tablet Commonly known as: TYLENOL  Take by mouth.   albuterol  108 (90 Base) MCG/ACT inhaler Commonly known as: VENTOLIN  HFA Inhale 2 puffs into the lungs every 6 (six) hours as needed for wheezing or shortness of breath.   amoxicillin  500 MG capsule Commonly known as: AMOXIL  Take 1 capsule (500 mg total) by mouth 2 (two) times daily.   buPROPion  150 MG 24 hr tablet Commonly known as: WELLBUTRIN  XL Take 150 mg by mouth.   cetirizine  10 MG tablet Commonly known as: ZyrTEC  Allergy Take 1 tablet (10 mg total) by mouth daily.   clonazePAM  0.5 MG tablet Commonly known as: KLONOPIN  Take 1 tablet (0.5 mg total) by mouth 2 (two) times daily.   cycloSPORINE 0.05 % ophthalmic emulsion Commonly known as: RESTASIS Restasis  0.05 % eye drops in a dropperette  USE 1 DROP INTO BOTH EYES TWICE A DAY   diclofenac  75 MG EC tablet Commonly known as: VOLTAREN  Take 1 tablet (75 mg total) by mouth 2 (two) times daily.   eletriptan  40 MG tablet Commonly known as: RELPAX  TAKE 1 TABLET BY MOUTH EVERY 2 HOURS AS NEEDED FOR MIGRAINE HEADACHE . DO NOT EXCEED 6 PER 24 HOURS   estradiol  0.075 MG/24HR Commonly known as: VIVELLE -DOT Place 1 patch onto the skin 2 (two) times a week.   finasteride 5 MG tablet Commonly known  as: PROSCAR Take by mouth.   Fluocinolone  Acetonide 0.01 % Oil place 1 or 2 drops in right ear daily AS DIRECTED.   fluticasone  50 MCG/ACT nasal spray Commonly known as: FLONASE  Place 2 sprays into both nostrils daily.   guaiFENesin  600 MG 12 hr tablet Commonly known as: Mucinex  Take 2 tablets (1,200 mg total) by mouth 2 (two) times daily.   imipramine  10 MG tablet Commonly known as: TOFRANIL  Take 3 tablets (30 mg total) by mouth at bedtime.   levothyroxine  50 MCG tablet Commonly known as: SYNTHROID  TAKE 1 TABLET BY MOUTH ONCE DAILY BEFORE BREAKFAST   methocarbamol  500 MG tablet Commonly known as: ROBAXIN  Take 1 tablet (500 mg total) by mouth 4 (four) times daily.   mometasone  50 MCG/ACT nasal spray Commonly known as: NASONEX  PLACE 2 SPRAYS INTO THE NOSE DAILY.   MULTIVITAMIN PO Take by mouth.   omeprazole  40 MG capsule Commonly known as: PRILOSEC Take 1 capsule (40 mg total) by mouth daily.   ondansetron  4 MG disintegrating tablet Commonly known as: ZOFRAN -ODT Take 1 tablet (4 mg total) by mouth every 8 (eight) hours as needed for nausea or vomiting.   tacrolimus  0.1 % ointment Commonly known as: PROTOPIC  tacrolimus  0.1 % topical ointment  APPLY OINTMENT TOPICALLY TO AFFECTED AREA TWICE DAILY   valACYclovir  500 MG tablet Commonly known as: VALTREX  Take 500 mg by mouth daily as needed.         Objective:   BP 117/77   Pulse 78   Temp 98 F (36.7 C)   Ht 5' 4 (1.626 m)   Wt 132 lb (59.9 kg)   SpO2 93%   BMI 22.66 kg/m   Wt Readings from Last 3 Encounters:  10/05/24 132 lb (59.9 kg)  09/03/24 132 lb (59.9 kg)  08/07/24 130 lb 12.8 oz (59.3 kg)    Physical Exam Vitals and nursing note reviewed.  Constitutional:      General: She is not in acute distress.    Appearance: She is well-developed. She is not diaphoretic.  Eyes:     Conjunctiva/sclera: Conjunctivae normal.  Abdominal:     General: Bowel sounds are normal. There is no distension.      Palpations: Abdomen is soft. Abdomen is not rigid. There is no mass.     Tenderness: There is abdominal tenderness in the suprapubic area. There is no guarding or rebound.  Skin:    General: Skin is warm and dry.     Findings: No rash.  Neurological:     Mental Status: She is alert and oriented to person, place, and time.     Coordination: Coordination normal.  Psychiatric:        Behavior: Behavior normal.               Urinalysis showed 0-5 WBCs and 3-10 RBCs and 0-10 epithelial cells and few bacteria and yeast 2+ blood,  Assessment & Plan:   Problem List Items Addressed This Visit       Other   Fatigue   Other Visit Diagnoses       Urinary frequency    -  Primary   Relevant Orders   Urinalysis, Complete   Urine Culture           Urinary frequency Recent onset possibly related to recent excisional biopsy or UTI due to catheter use during surgery. Urinalysis pending.  Fatigue Possibly related to recent surgery and anesthesia. Recovery may take up to two weeks. - Monitor fatigue symptoms and allow time for recovery.      Urinalysis showed a little bit of blood but likely from irritation from the surgery, no major signs of infection hydration let us  know if anything worsens in the future.  Will also run culture    Follow up plan: Return if symptoms worsen or fail to improve.  Counseling provided for all of the vaccine components Orders Placed This Encounter  Procedures   Urine Culture   Urinalysis, Complete    Fonda Levins, MD [facility] 10/05/2024, 2:22 PM     "

## 2024-10-05 NOTE — Addendum Note (Signed)
 Addended by: LEIGH ROSINA SAILOR on: 10/05/2024 02:28 PM   Modules accepted: Orders

## 2024-10-07 LAB — URINE CULTURE: Organism ID, Bacteria: NO GROWTH

## 2024-10-08 ENCOUNTER — Encounter: Payer: Self-pay | Admitting: Nurse Practitioner

## 2024-10-08 ENCOUNTER — Ambulatory Visit: Payer: Self-pay | Admitting: Family Medicine

## 2024-10-08 ENCOUNTER — Ambulatory Visit (INDEPENDENT_AMBULATORY_CARE_PROVIDER_SITE_OTHER): Admitting: Nurse Practitioner

## 2024-10-08 ENCOUNTER — Ambulatory Visit: Payer: Self-pay

## 2024-10-08 VITALS — BP 105/70 | HR 96 | Temp 97.8°F | Ht 64.0 in | Wt 131.0 lb

## 2024-10-08 DIAGNOSIS — M545 Low back pain, unspecified: Secondary | ICD-10-CM
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
## 2024-10-08 NOTE — Telephone Encounter (Signed)
 FYI Only or Action Required?: FYI only for provider: appointment scheduled on 10/08/24.  Patient was last seen in primary care on 10/05/2024 by Dettinger, Fonda LABOR, MD.  Called Nurse Triage reporting Back Pain.  Symptoms began several days ago.  Interventions attempted: OTC medications: Tylenol  and heating pad.  Symptoms are: unchanged.  Triage Disposition: See PCP When Office is Open (Within 3 Days)  Patient/caregiver understands and will follow disposition?: Yes  Message from Alta F sent at 10/08/2024  9:55 AM EST  Reason for Triage: Concern: Concern: middle of back pain ( both sides)  Symptoms:  Symptoms began after swelling stomach episode (Patient does have IBS)  Stomach is no longer overly active or swollen but back pain did not subside  Pain Level (1-10): 6   When did the symptoms start?: Saturday Night ( 10/06/2024)   What have you done to aid in the concern ? Have you taken anything to assist with the matter?: Yes   If so, what did you take?: Tylenol , heating pad ( some relief provided by both)   Reason for Disposition  [1] MODERATE back pain (e.g., interferes with normal activities) AND [2] present > 3 days  Answer Assessment - Initial Assessment Questions IBS flare up Saturday but sx have resolved, normally doesn't have back pain but now does.  Has taken Tylenol  and heating pad. Currently 6/10 pain.  LBM 10/05/24  1. ONSET: When did the pain begin? (e.g., minutes, hours, days)     Saturday  2. LOCATION: Where does it hurt? (upper, mid or lower back)     Middle of back on both sides  3. SEVERITY: How bad is the pain?  (e.g., Scale 1-10; mild, moderate, or severe)     6/10 4. PATTERN: Is the pain constant? (e.g., yes, no; constant, intermittent)      Constant  5. RADIATION: Does the pain shoot into your legs or somewhere else?     no 6. CAUSE:  What do you think is causing the back pain?      IBS flare up 7. BACK OVERUSE:  Any recent  lifting of heavy objects, strenuous work or 8. MEDICINES: What have you taken so far for the pain? (e.g., nothing, acetaminophen , NSAIDS)     Tylenol  10. OTHER SYMPTOMS: Do you have any other symptoms? (e.g., fever, abdomen pain, burning with urination, blood in urine)       NA  Protocols used: Back Pain-A-AH

## 2024-10-08 NOTE — Patient Instructions (Signed)
 Acute Back Pain, Adult Acute back pain is sudden and usually short-lived. It is often caused by an injury to the muscles and tissues in the back. The injury may result from: A muscle, tendon, or ligament getting overstretched or torn. Ligaments are tissues that connect bones to each other. Lifting something improperly can cause a back strain. Wear and tear (degeneration) of the spinal disks. Spinal disks are circular tissue that provide cushioning between the bones of the spine (vertebrae). Twisting motions, such as while playing sports or doing yard work. A hit to the back. Arthritis. You may have a physical exam, lab tests, and imaging tests to find the cause of your pain. Acute back pain usually goes away with rest and home care. Follow these instructions at home: Managing pain, stiffness, and swelling Take over-the-counter and prescription medicines only as told by your health care provider. Treatment may include medicines for pain and inflammation that are taken by mouth or applied to the skin, or muscle relaxants. Your health care provider may recommend applying ice during the first 24-48 hours after your pain starts. To do this: Put ice in a plastic bag. Place a towel between your skin and the bag. Leave the ice on for 20 minutes, 2-3 times a day. Remove the ice if your skin turns bright red. This is very important. If you cannot feel pain, heat, or cold, you have a greater risk of damage to the area. If directed, apply heat to the affected area as often as told by your health care provider. Use the heat source that your health care provider recommends, such as a moist heat pack or a heating pad. Place a towel between your skin and the heat source. Leave the heat on for 20-30 minutes. Remove the heat if your skin turns bright red. This is especially important if you are unable to feel pain, heat, or cold. You have a greater risk of getting burned. Activity  Do not stay in bed. Staying in  bed for more than 1-2 days can delay your recovery. Sit up and stand up straight. Avoid leaning forward when you sit or hunching over when you stand. If you work at a desk, sit close to it so you do not need to lean over. Keep your chin tucked in. Keep your neck drawn back, and keep your elbows bent at a 90-degree angle (right angle). Sit high and close to the steering wheel when you drive. Add lower back (lumbar) support to your car seat, if needed. Take short walks on even surfaces as soon as you are able. Try to increase the length of time you walk each day. Do not sit, drive, or stand in one place for more than 30 minutes at a time. Sitting or standing for long periods of time can put stress on your back. Do not drive or use heavy machinery while taking prescription pain medicine. Use proper lifting techniques. When you bend and lift, use positions that put less stress on your back: [place] your knees. Keep the load close to your body. Avoid twisting. Exercise regularly as told by your health care provider. Exercising helps your back heal faster and helps prevent back injuries by keeping muscles strong and flexible. Work with a physical therapist to make a safe exercise program, as recommended by your health care provider. Do any exercises as told by your physical therapist. Lifestyle Maintain a healthy weight. Extra weight puts stress on your back and makes it difficult to have good  posture. Avoid activities or situations that make you feel anxious or stressed. Stress and anxiety increase muscle tension and can make back pain worse. Learn ways to manage anxiety and stress, such as through exercise. General instructions Sleep on a firm mattress in a comfortable position. Try lying on your side with your knees slightly bent. If you lie on your back, put a pillow under your knees. Keep your head and neck in a straight line with your spine (neutral position) when using electronic equipment like  smartphones or pads. To do this: Raise your smartphone or pad to look at it instead of bending your head or neck to look down. Put the smartphone or pad at the level of your face while looking at the screen. Follow your treatment plan as told by your health care provider. This may include: Cognitive or behavioral therapy. Acupuncture or massage therapy. Meditation or yoga. Contact a health care provider if: You have pain that is not relieved with rest or medicine. You have increasing pain going down into your legs or buttocks. Your pain does not improve after 2 weeks. You have pain at night. You lose weight without trying. You have a fever or chills. You develop nausea or vomiting. You develop abdominal pain. Get help right away if: You develop new bowel or bladder control problems. You have unusual weakness or numbness in your arms or legs. You feel faint. These symptoms may represent a serious problem that is an emergency. Do not wait to see if the symptoms will go away. Get medical help right away. Call your local emergency services (911 in the [place] Do not drive yourself to the hospital. Summary Acute back pain is sudden and usually short-lived. Use proper lifting techniques. When you bend and lift, use positions that put less stress on your back. Take over-the-counter and prescription medicines only as told by your health care provider, and apply heat or ice as told. This information is not intended to replace advice given to you by your health care provider. Make sure you discuss any questions you have with your health care provider. Document Revised: 11/28/2020 Document Reviewed: 11/28/2020 [employer]vier Patient Education  2024 [employer].

## 2024-10-08 NOTE — Telephone Encounter (Signed)
 noted

## 2024-10-23 ENCOUNTER — Ambulatory Visit: Payer: Self-pay

## 2024-10-24 ENCOUNTER — Ambulatory Visit

## 2024-10-30 ENCOUNTER — Ambulatory Visit: Payer: Self-pay | Admitting: Family

## 2025-06-25 ENCOUNTER — Ambulatory Visit: Payer: Self-pay
